# Patient Record
Sex: Female | Born: 1935 | Race: Black or African American | Hispanic: No | Smoking: Never smoker
Health system: Southern US, Community
[De-identification: ages and names within clinical notes are randomized; demographics above are authoritative.]

## PROBLEM LIST (undated history)

## (undated) DIAGNOSIS — Z9981 Dependence on supplemental oxygen: Secondary | ICD-10-CM

## (undated) DIAGNOSIS — H409 Unspecified glaucoma: Secondary | ICD-10-CM

## (undated) DIAGNOSIS — E039 Hypothyroidism, unspecified: Secondary | ICD-10-CM

## (undated) DIAGNOSIS — F0390 Unspecified dementia without behavioral disturbance: Secondary | ICD-10-CM

## (undated) DIAGNOSIS — J449 Chronic obstructive pulmonary disease, unspecified: Secondary | ICD-10-CM

## (undated) DIAGNOSIS — M1990 Unspecified osteoarthritis, unspecified site: Secondary | ICD-10-CM

## (undated) DIAGNOSIS — I4510 Unspecified right bundle-branch block: Secondary | ICD-10-CM

## (undated) DIAGNOSIS — R001 Bradycardia, unspecified: Secondary | ICD-10-CM

## (undated) DIAGNOSIS — F03A0 Unspecified dementia, mild, without behavioral disturbance, psychotic disturbance, mood disturbance, and anxiety: Secondary | ICD-10-CM

## (undated) DIAGNOSIS — I4821 Permanent atrial fibrillation: Secondary | ICD-10-CM

## (undated) DIAGNOSIS — N183 Chronic kidney disease, stage 3 unspecified: Secondary | ICD-10-CM

## (undated) DIAGNOSIS — I2699 Other pulmonary embolism without acute cor pulmonale: Secondary | ICD-10-CM

## (undated) DIAGNOSIS — J9611 Chronic respiratory failure with hypoxia: Secondary | ICD-10-CM

## (undated) DIAGNOSIS — I272 Pulmonary hypertension, unspecified: Secondary | ICD-10-CM

## (undated) DIAGNOSIS — I5032 Chronic diastolic (congestive) heart failure: Secondary | ICD-10-CM

## (undated) DIAGNOSIS — I4892 Unspecified atrial flutter: Secondary | ICD-10-CM

## (undated) DIAGNOSIS — R0600 Dyspnea, unspecified: Secondary | ICD-10-CM

## (undated) DIAGNOSIS — J8410 Pulmonary fibrosis, unspecified: Secondary | ICD-10-CM

## (undated) HISTORY — DX: Unspecified glaucoma: H40.9

## (undated) HISTORY — DX: Chronic kidney disease, stage 3 unspecified: N18.30

## (undated) HISTORY — DX: Unspecified dementia, mild, without behavioral disturbance, psychotic disturbance, mood disturbance, and anxiety: F03.A0

## (undated) HISTORY — PX: EYE SURGERY: SHX253

## (undated) HISTORY — DX: Bradycardia, unspecified: R00.1

## (undated) HISTORY — DX: Unspecified dementia without behavioral disturbance: F03.90

## (undated) HISTORY — DX: Chronic diastolic (congestive) heart failure: I50.32

## (undated) HISTORY — DX: Unspecified right bundle-branch block: I45.10

## (undated) HISTORY — DX: Chronic kidney disease, stage 3 (moderate): N18.3

## (undated) HISTORY — DX: Unspecified atrial flutter: I48.92

## (undated) HISTORY — DX: Pulmonary hypertension, unspecified: I27.20

---

## 1976-09-27 HISTORY — PX: OTHER SURGICAL HISTORY: SHX169

## 2006-11-14 ENCOUNTER — Emergency Department (HOSPITAL_COMMUNITY): Admission: EM | Admit: 2006-11-14 | Discharge: 2006-11-14 | Payer: Self-pay | Admitting: Family Medicine

## 2011-02-02 ENCOUNTER — Inpatient Hospital Stay (HOSPITAL_COMMUNITY)
Admission: EM | Admit: 2011-02-02 | Discharge: 2011-02-06 | DRG: 193 | Disposition: A | Discharge status: Home Health | Payer: Medicare Other | Attending: Internal Medicine | Admitting: Internal Medicine

## 2011-02-02 ENCOUNTER — Emergency Department (HOSPITAL_COMMUNITY): Payer: Medicare Other

## 2011-02-02 ENCOUNTER — Inpatient Hospital Stay (HOSPITAL_COMMUNITY): Payer: Medicare Other

## 2011-02-02 ENCOUNTER — Encounter (HOSPITAL_COMMUNITY): Payer: Self-pay | Admitting: Radiology

## 2011-02-02 DIAGNOSIS — I509 Heart failure, unspecified: Secondary | ICD-10-CM | POA: Diagnosis present

## 2011-02-02 DIAGNOSIS — F0390 Unspecified dementia without behavioral disturbance: Secondary | ICD-10-CM | POA: Diagnosis present

## 2011-02-02 DIAGNOSIS — J189 Pneumonia, unspecified organism: Principal | ICD-10-CM | POA: Diagnosis present

## 2011-02-02 DIAGNOSIS — R5381 Other malaise: Secondary | ICD-10-CM | POA: Diagnosis present

## 2011-02-02 DIAGNOSIS — R0789 Other chest pain: Secondary | ICD-10-CM | POA: Diagnosis present

## 2011-02-02 DIAGNOSIS — M79609 Pain in unspecified limb: Secondary | ICD-10-CM

## 2011-02-02 DIAGNOSIS — N200 Calculus of kidney: Secondary | ICD-10-CM | POA: Diagnosis present

## 2011-02-02 DIAGNOSIS — I5033 Acute on chronic diastolic (congestive) heart failure: Secondary | ICD-10-CM | POA: Diagnosis present

## 2011-02-02 DIAGNOSIS — R071 Chest pain on breathing: Secondary | ICD-10-CM | POA: Diagnosis present

## 2011-02-02 DIAGNOSIS — IMO0001 Reserved for inherently not codable concepts without codable children: Secondary | ICD-10-CM | POA: Diagnosis present

## 2011-02-02 LAB — URINALYSIS, ROUTINE W REFLEX MICROSCOPIC
Bilirubin Urine: NEGATIVE
Specific Gravity, Urine: 1.02 (ref 1.005–1.030)

## 2011-02-02 LAB — CK TOTAL AND CKMB (NOT AT ARMC)
Relative Index: 1.2 (ref 0.0–2.5)
Total CK: 332 U/L — ABNORMAL HIGH (ref 7–177)

## 2011-02-02 LAB — CBC
HCT: 41.6 % (ref 36.0–46.0)
Hemoglobin: 14.1 g/dL (ref 12.0–15.0)
MCH: 26.6 pg (ref 26.0–34.0)
MCH: 26.4 pg (ref 26.0–34.0)
MCHC: 33.9 g/dL (ref 30.0–36.0)
MCHC: 33.3 g/dL (ref 30.0–36.0)
RBC: 5.3 MIL/uL — ABNORMAL HIGH (ref 3.87–5.11)
RDW: 15.3 % (ref 11.5–15.5)
RDW: 15.4 % (ref 11.5–15.5)
WBC: 16.7 10*3/uL — ABNORMAL HIGH (ref 4.0–10.5)
WBC: 10.7 10*3/uL — ABNORMAL HIGH (ref 4.0–10.5)

## 2011-02-02 LAB — BASIC METABOLIC PANEL
BUN: 13 mg/dL (ref 6–23)
Calcium: 8.8 mg/dL (ref 8.4–10.5)
Chloride: 102 mEq/L (ref 96–112)
Creatinine, Ser: 0.84 mg/dL (ref 0.4–1.2)
GFR calc Af Amer: >60 mL/min (ref >60)
GFR calc non Af Amer: >60 mL/min (ref >60)
Sodium: 134 mEq/L — ABNORMAL LOW (ref 135–145)

## 2011-02-02 LAB — GLUCOSE, CAPILLARY
Glucose-Capillary: 292 mg/dL — ABNORMAL HIGH (ref 70–99)
Glucose-Capillary: 277 mg/dL — ABNORMAL HIGH (ref 70–99)
Glucose-Capillary: 333 mg/dL — ABNORMAL HIGH (ref 70–99)

## 2011-02-02 LAB — CARDIAC PANEL(CRET KIN+CKTOT+MB+TROPI)
CK, MB: 4.3 ng/mL — ABNORMAL HIGH (ref 0.3–4.0)
Relative Index: 1.6 (ref 0.0–2.5)
Total CK: 316 U/L — ABNORMAL HIGH (ref 7–177)
Total CK: 265 U/L — ABNORMAL HIGH (ref 7–177)

## 2011-02-02 LAB — DIFFERENTIAL
Eosinophils Absolute: 0.7 10*3/uL (ref 0.0–0.7)
Lymphocytes Relative: 6 % — ABNORMAL LOW (ref 12–46)
Monocytes Absolute: 1.4 10*3/uL — ABNORMAL HIGH (ref 0.1–1.0)
Monocytes Relative: 9 % (ref 3–12)
Neutro Abs: 13.5 10*3/uL — ABNORMAL HIGH (ref 1.7–7.7)

## 2011-02-02 LAB — URINE MICROSCOPIC-ADD ON

## 2011-02-02 LAB — D-DIMER, QUANTITATIVE: D-Dimer, Quant: 3.03 ug/mL-FEU — ABNORMAL HIGH (ref 0.00–0.48)

## 2011-02-02 LAB — TROPONIN I: Troponin I: <0.3 ng/mL (ref <0.30)

## 2011-02-02 MED ORDER — XENON XE 133 GAS
5.7000 | GAS_FOR_INHALATION | Freq: Once | RESPIRATORY_TRACT | Status: AC | PRN
  Administered 2011-02-02: 5.7 via RESPIRATORY_TRACT

## 2011-02-02 MED ORDER — TECHNETIUM TO 99M ALBUMIN AGGREGATED
5.4000 | Freq: Once | INTRAVENOUS | Status: AC | PRN
  Administered 2011-02-02: 5.4 via INTRAVENOUS

## 2011-02-03 ENCOUNTER — Inpatient Hospital Stay (HOSPITAL_COMMUNITY): Payer: Medicare Other

## 2011-02-03 DIAGNOSIS — I517 Cardiomegaly: Secondary | ICD-10-CM

## 2011-02-03 LAB — URINE CULTURE
Colony Count: NO GROWTH
Culture: NO GROWTH

## 2011-02-03 LAB — COMPREHENSIVE METABOLIC PANEL
AST: 15 U/L (ref 0–37)
BUN: 17 mg/dL (ref 6–23)
CO2: 26 mEq/L (ref 19–32)
Calcium: 8.8 mg/dL (ref 8.4–10.5)
Chloride: 104 mEq/L (ref 96–112)
Creatinine, Ser: 0.68 mg/dL (ref 0.4–1.2)
GFR calc non Af Amer: >60 mL/min (ref >60)
Glucose, Bld: 243 mg/dL — ABNORMAL HIGH (ref 70–99)
Total Bilirubin: 0.2 mg/dL — ABNORMAL LOW (ref 0.3–1.2)

## 2011-02-03 LAB — PHOSPHORUS: Phosphorus: 2.5 mg/dL (ref 2.3–4.6)

## 2011-02-03 LAB — CARDIAC PANEL(CRET KIN+CKTOT+MB+TROPI)
Total CK: 222 U/L — ABNORMAL HIGH (ref 7–177)
Total CK: 178 U/L — ABNORMAL HIGH (ref 7–177)
Troponin I: <0.3 ng/mL (ref <0.30)
Troponin I: <0.3 ng/mL (ref <0.30)

## 2011-02-03 LAB — DIFFERENTIAL
Basophils Absolute: 0 10*3/uL (ref 0.0–0.1)
Basophils Relative: 0 % (ref 0–1)
Eosinophils Relative: 13 % — ABNORMAL HIGH (ref 0–5)
Lymphocytes Relative: 25 % (ref 12–46)
Monocytes Absolute: 0.8 10*3/uL (ref 0.1–1.0)
Neutro Abs: 5 10*3/uL (ref 1.7–7.7)

## 2011-02-03 LAB — EXPECTORATED SPUTUM ASSESSMENT W GRAM STAIN, RFLX TO RESP C

## 2011-02-03 LAB — MAGNESIUM: Magnesium: 2.2 mg/dL (ref 1.5–2.5)

## 2011-02-03 LAB — CBC
HCT: 34.5 % — ABNORMAL LOW (ref 36.0–46.0)
MCHC: 33 g/dL (ref 30.0–36.0)
RDW: 15.5 % (ref 11.5–15.5)
WBC: 9.4 10*3/uL (ref 4.0–10.5)

## 2011-02-03 LAB — PROTIME-INR
INR: 1.22 (ref 0.00–1.49)
Prothrombin Time: 15.6 seconds — ABNORMAL HIGH (ref 11.6–15.2)

## 2011-02-03 LAB — GLUCOSE, CAPILLARY
Glucose-Capillary: 228 mg/dL — ABNORMAL HIGH (ref 70–99)
Glucose-Capillary: 234 mg/dL — ABNORMAL HIGH (ref 70–99)

## 2011-02-04 ENCOUNTER — Encounter (HOSPITAL_COMMUNITY): Payer: Self-pay | Admitting: Radiology

## 2011-02-04 ENCOUNTER — Inpatient Hospital Stay (HOSPITAL_COMMUNITY): Payer: Medicare Other

## 2011-02-04 LAB — BASIC METABOLIC PANEL
Calcium: 9.4 mg/dL (ref 8.4–10.5)
Chloride: 102 mEq/L (ref 96–112)
Creatinine, Ser: 0.67 mg/dL (ref 0.4–1.2)
GFR calc Af Amer: >60 mL/min (ref >60)
Sodium: 134 mEq/L — ABNORMAL LOW (ref 135–145)

## 2011-02-04 LAB — CBC
MCH: 25.3 pg — ABNORMAL LOW (ref 26.0–34.0)
MCHC: 31.8 g/dL (ref 30.0–36.0)
MCV: 79.5 fL (ref 78.0–100.0)
Platelets: 234 10*3/uL (ref 150–400)
RBC: 4.63 MIL/uL (ref 3.87–5.11)

## 2011-02-04 LAB — GLUCOSE, CAPILLARY: Glucose-Capillary: 322 mg/dL — ABNORMAL HIGH (ref 70–99)

## 2011-02-04 LAB — PROTIME-INR: INR: 1.15 (ref 0.00–1.49)

## 2011-02-04 MED ORDER — IOHEXOL 300 MG/ML  SOLN
80.0000 mL | Freq: Once | INTRAMUSCULAR | Status: AC | PRN
  Administered 2011-02-04: 80 mL via INTRAVENOUS

## 2011-02-05 LAB — LEGIONELLA ANTIGEN, URINE

## 2011-02-05 LAB — GLUCOSE, CAPILLARY
Glucose-Capillary: 126 mg/dL — ABNORMAL HIGH (ref 70–99)
Glucose-Capillary: 113 mg/dL — ABNORMAL HIGH (ref 70–99)

## 2011-02-05 LAB — BASIC METABOLIC PANEL
BUN: 17 mg/dL (ref 6–23)
Chloride: 106 mEq/L (ref 96–112)
Creatinine, Ser: 0.64 mg/dL (ref 0.4–1.2)
Glucose, Bld: 154 mg/dL — ABNORMAL HIGH (ref 70–99)
Potassium: 3.9 mEq/L (ref 3.5–5.1)

## 2011-02-05 LAB — CBC
HCT: 34.1 % — ABNORMAL LOW (ref 36.0–46.0)
MCH: 26.3 pg (ref 26.0–34.0)
MCV: 78.8 fL (ref 78.0–100.0)
Platelets: 248 10*3/uL (ref 150–400)
RDW: 15 % (ref 11.5–15.5)
WBC: 13.1 10*3/uL — ABNORMAL HIGH (ref 4.0–10.5)

## 2011-02-06 LAB — BASIC METABOLIC PANEL
CO2: 29 mEq/L (ref 19–32)
Chloride: 104 mEq/L (ref 96–112)
GFR calc Af Amer: >60 mL/min (ref >60)
Glucose, Bld: 84 mg/dL (ref 70–99)
Potassium: 3.8 mEq/L (ref 3.5–5.1)
Sodium: 142 mEq/L (ref 135–145)

## 2011-02-06 LAB — CULTURE, RESPIRATORY W GRAM STAIN: Culture: NORMAL

## 2011-02-06 LAB — GLUCOSE, CAPILLARY

## 2011-02-17 NOTE — Discharge Summary (Signed)
Sara Dennis, Sara Dennis                 ACCOUNT NO.:  [id number]  MEDICAL RECORD NO.:  [id number]           PATIENT TYPE:  I  LOCATION:  1519                         FACILITY:  [facility]  PHYSICIAN:  Rosanna Randy, MDDATE OF BIRTH:  Dec 22, 1921  DATE OF ADMISSION:  02/02/2011 DATE OF DISCHARGE:  02/06/2011                              DISCHARGE SUMMARY   PRIMARY CARE PHYSICIAN:  Dr. Jeannine Boga  DISCHARGE DIAGNOSES: 1. Atypical right-sided chest pain, pleuritic in nature, secondary to     pneumonia. 2. Shortness of breath secondary to pneumonia and also grade 1     diastolic dysfunction. 3. Diabetes mellitus. 4. Dementia. 5. Physical deconditioning. 6. Oxygen desaturation on exertion secondary to pneumonia and     diastolic dysfunction. 7. Leukocytosis and fever secondary to pneumonia. 8. Nephrolithiasis.  DISCHARGE MEDICATIONS: 1. Aspirin 325 mg by mouth daily. 2. Lasix 20 mg by mouth daily. 3. Glipizide 5 mg by mouth twice a day with meals. 4. Moxifloxacin 400 mg one tablet by mouth daily for the next 4 days     to complete antibiotic therapy. 5. Potassium 20 mEq by mouth daily. 6. Lantus 35 units subcutaneously twice a day. 7. Donepezil 5 mg one tablet by mouth every morning. 8. Ocuvite one tablet by mouth daily. 9. The patient has been informed to stop using her extended release     metformin 500 mg one tablet by mouth daily at bedtime until she     follows with her primary care physician.  DISPOSITION AND FOLLOWUP:  The patient has been discharged in stable and improved condition with instructions to arrange a followup appointment with her primary care physician over the next 5 to 7 days.  After Physical Therapy evaluation done inside the hospital, recommendations are for the patient to go home with home health physical therapy, which is going to be arranged for her.  Also due to oxygen desaturation due to acute pneumonia and also findings of a grade 1  diastolic dysfunction, the patient is going to be discharged on oxygen in order for her to use 1 to 2 liters through nasal cannula of oxygen at all times, especially on exertion until she follows with her primary care physician.  At that time it is going to be important to review the patient's basic metabolic panel to make sure that her electrolytes are okay, especially after starting her on Lasix.  It is going to be important to reassess the patient's oxygen saturation, especially on ambulation, to determine if she will require further use of oxygen.  It will be also important to evaluate the patient's blood glucose log in order to determine further adjustment on her diabetes medications.  She was discharged on a twice a day Lantus injection to have a better coverage and overlapping throughout the day with the long-acting baseline insulin and also with glipizide twice a day.  Using this regimen inside the hospital, the patient's blood sugar maintained for about 48 hours in the 113 to 160 range.  There were no incidents with hypoglycemia events.  PROCEDURES PERFORMED DURING THIS HOSPITALIZATION:  The patient had  a chest x-ray on May 8, at that moment demonstrating cardiac enlargement without prominence of pulmonary vascularity.  There were also some interstitial changes in the lung bases that suggest interstitial edema. There were no definite focal consolidations and there was no definite blunting of the costophrenic angles.  The patient had a Nuclear Medicine VQ scan that demonstrated intermittent probability for pulmonary embolism.  At that moment she had a CT angiogram of the chest that demonstrated interstitial edema and a small bilateral pleural effusion without any pulmonary embolism.  The patient also had bilateral lower extremity Dopplers that demonstrated no evidence of deep vein or superficial thrombosis involving the right lower extremity and left lower extremity.  Varicose  veins, which appear patent, were visualized in both calves.  There was no evidence of Baker's cysts on the right or left lower extremity.  The patient also had a 2-D echocardiogram on Feb 03, 2011, that demonstrated mild left ventricular hypertrophy with a mild focal basal hypertrophy of the septum.  Normal systolic function with an ejection fraction of 60% to 65%.  Also abnormal left ventricular relaxation with Doppler parameters finding for grade 1 diastolic dysfunction.  No other procedures were performed during this admission.  CONSULTATIONS:  No consultations were made.  HISTORY OF PRESENT ILLNESS:  Sara Dennis is an 77 year old female with a history of diabetes and nephrolithiasis who presented to the Emergency Department complaining of fever, weakness, shortness of breath and right- sided chest discomfort.  The patient has had malaise and has not been eating well throughout the last 5 to 7 days prior to admission.  She had recently been treated for a UTI with Bactrim, but developed a rash and then finished a course of Macrobid the day prior to being admitted.  The patient had a chest x-ray in the Emergency Department that was read as possible congestive heart failure.  However, the patient clinically fits more into the pneumonia-like picture with elevated white blood cells, fever and also cough associated with general malaise.  The patient was admitted for possible community-acquired pneumonia treatment.  Due to her chest pain, rule out pulmonary embolism and any other conditions, especially since the patient has a family history for pulmonary embolism.  For further details, please refer to the dictation done by Dr. Henry Russel on Feb 02, 2011.  PHYSICAL EXAMINATION ON ADMISSION:  VITAL SIGNS:  Demonstrated a temperature of 101.6, heart rate of 108, blood pressure 167/97, oxygen saturation was 99% on room air. HEENT:  Anicteric. NECK:  No JVD.  No bruit. HEART:  Regular rate and rhythm.   S1, S2 auscultated without any murmurs. LUNGS:  Positive crackles, left greater than right.  Scattered rhonchi. Good air movement. ABDOMEN:  Soft, obese, nontender, nondistended with positive bowel sounds. EXTREMITIES:  No cyanosis, clubbing or edema.  There was no pain either. Negative Hoffmann signs. SKIN:  No rashes. LYMPH NODES:  Without any adenopathy. NEUROLOGIC:  Examination was nonfocal.  PERTINENT LABORATORY DATA:  On admission white blood cells 16.7, hemoglobin was 14.1, platelet count 257.  Urinalysis was negative to demonstrate any abnormality.  A basic metabolic panel showed a sodium of 134, potassium 4.1, BUN 13, creatinine 0.87.  BNP was 750.  Chest x-ray as mentioned above with findings consistent with congestive heart failure and interstitial lung edema with no definite focal consolidation.  Other pertinent laboratory data throughout this hospitalization includes a D-dimer which was elevated at 3.03.  Cardiac markers negative x3.  The patient had a magnesium of 2.1,  phosphorus of 3.0.  A sputum culture demonstrated a few gram-positive rods with a few gram-positive cocci in pairs.  Legionella urine antigen was negative. Hemoglobin A1c 9.7.  PT 14.9, INR 1.15.  HOSPITAL COURSE BY PROBLEM: 1. The patient's pneumonia with clinical findings of cough, fever,     elevated white blood cells and also a respiratory culture with a     few gram-negative rods and a few gram-positive cocci in pairs.  The     patient received initially treatment with Zithromax and Rocephin     for 3 days and then was transitioned to Avelox by mouth daily with     instructions to complete 8 days total of antibiotics.  The patient     is going to arrange followup with her primary care physician over     the next 5 to 7 days in order to follow complete resolution of her     symptoms and also to have further adjustments of her medications,     especially for diabetes.  At that point it is going to be  also     evaluated and determined if the patient is going to require further     oxygen supplementation.  At the moment of discharge the patient's     fever, white blood cells and cough were pretty much resolved. 2. The patient's diastolic dysfunction with a CHF grade 1 that was     found on the 2-D echocardiogram performed during this admission and     also with an elevated BNP.  The patient was started on Lasix that     had actually pretty much resolved the crackles on physical     examination auscultations and was instructed to follow a low-sodium     diet less than 2 grams daily.  She is going to follow with primary     care physician and further adjustments can be done for these     problems. 3. The patient's diabetes, uncontrolled, with a hemoglobin A1c of 9.5.     At this moment a new regimen using Lantus twice a day and glipizide     5 mg was started.  The patient's blood sugar behaved wonderfully     when she was in the hospital.  She is going to continue having     further adjustments as an outpatient by primary care physician     during her followup. 4. The patient's dementia.  The plan is to continue using Aricept. 5. The patient's physical deconditioning, most likely secondary to the     infection.  She is going to have home health physical therapy     arranged as an outpatient. 6. The patient's oxygen desaturation on exertion, most likely a     combination of her diastolic congestive heart failure with acute     mild exacerbation due to the infection in her lungs and also the     infection itself.  At this moment she is going to continue using 1     to 2 liters of oxygen delivered through nasal cannula.  A portable     oxygen kit has been arranged for the patient to go home with and     she is going to follow with her primary care physician where she is     going to be reevaluated for the need of oxygen supplementation.  PHYSICAL EXAMINATION ON DISCHARGE:  VITAL SIGNS:   97.5 temperature, heart rate 60, respiratory rate 19,  blood pressure 157/88, oxygen saturation 96% on 2 liters. GENERAL:  The patient without any acute distress. RESPIRATORY SYSTEM:  Clear to auscultation bilaterally except for mild rhonchi. HEART:  Regular rate and rhythm. ABDOMEN:  Soft, nontender, nondistended.  Positive bowel sounds. EXTREMITIES:  Without any edema. NEUROLOGIC:  Examination was nonfocal.  LABORATORY DATA ON DISCHARGE:  The patient's blood work at discharge demonstrated a sodium of 142, potassium 3.8, chloride 104, bicarbonate 29, BUN 19, creatinine 0.62, blood sugar 84.  Her calcium was 9.2.     Rosanna Randy, MD     CEM/MEDQ  D:  02/06/2011  T:  02/06/2011  Job:  [id number]  cc:   Dr Norman Clay  Electronically Signed by Vassie Loll MD on 02/17/2011 10:25:11 PM

## 2011-03-07 NOTE — H&P (Signed)
Sara Dennis, Sara Dennis                 ACCOUNT NO.:  [id number]  MEDICAL RECORD NO.:  [id number]           PATIENT TYPE:  E  LOCATION:  WLED                         FACILITY:  [facility]  PHYSICIAN:  Massie Maroon, MD        DATE OF BIRTH:  [birth year]  DATE OF ADMISSION:  02/02/2011 DATE OF DISCHARGE:                             HISTORY & PHYSICAL   CHIEF COMPLAINT:  Fever and shortness of breath and weakness.  HISTORY OF PRESENT ILLNESS:  An 77 year old female with a history of diabetes type 2, nephrolithiasis apparently presents with complaints of fever, weakness, shortness of breath, and right-sided chest discomfort. The patient has had malaise and has not been eating well.  She had recently been treated for UTI with Bactrim but developed a rash and then finished a course of Macrobid yesterday.  The patient had a chest x-ray in the ED.  That was read as possible congestive heart failure. However, clinically she fits more in a pneumonia like picture with an elevated white count and fever.  The patient will be admitted for possible community-acquired pneumonia.  PAST MEDICAL HISTORY: 1. Diabetes type 2. 2. Nephrolithiasis.  PAST SURGICAL HISTORY:  Nephrolithiasis.  SOCIAL HISTORY:  The patient does not smoke or drink.  She spends time between here and [place], [place].  She used to work for [employer].  FAMILY HISTORY:  None.  ALLERGIES:  BACTRIM - rash.  MEDICATIONS: 1. Metformin 500 mg p.o. daily. 2. Nitrofurantoin 100 mg p.o. b.i.d. 3. Aricept 5 mg p.o. daily. 4. Ocuvite one p.o. daily.  REVIEW OF SYSTEMS:  Negative for all 10 organ systems except for pertinent positives stated above.  PHYSICAL EXAM:  VITAL SIGNS:  Temperature 101.6, pulse 108, blood pressure 167/97, pulse ox 99% on room air. HEENT:  Anicteric. NECK:  No JVD, no bruit. HEART:  Regular rate and rhythm.  S1, S2. LUNGS:  Positive crackles, left greater than right base. ABDOMEN:  Soft, obese,  nontender, nondistended.  Positive bowel sounds. EXTREMITIES:  No cyanosis, clubbing, or edema. SKIN:  No rashes. LYMPH NODES:  No adenopathy. NEURO:  Exam nonfocal.  LABORATORY DATA:  WBC 16.7, hemoglobin 14.1, platelet count 257. Urinalysis is negative.  Sodium 134, potassium 4.1, BUN 13, creatinine 0.87.  Pro BNP 750.7.  Chest x-ray, congestive changes in the heart and lungs with interstitial edema.  ASSESSMENT/PLAN: 1. Clinically pneumonia:  (Community-acquired pneumonia) Sputum Gram     stain and culture, urine Legionella antigen and treat with     ceftriaxone and Zithromax. 2. Chest pain:  The patient placed on telemetry.  We will check CK, CK-     MB, troponin I q.6 h x3 sets.  Check a D-dimer and if positive     obtain a CTA chest. 3. Diabetes type 2. Fingerstick blood sugars a.c. and h.s. NovoLog     sensitive sliding scale. 4. Nephrolithiasis:  The patient has hematuria on UA.  This can be     followed up as an outpatient. 5. Hypertension uncontrolled:  Hydralazine 10 mg IV q.6 h. .r.n.     systolic blood pressure  greater than 160. 6. Cardiac murmur:  Check cardiac 2-D echo.  DVT prophylaxis Lovenox,     SCDs.     Massie Maroon, MD     JYK/MEDQ  D:  02/02/2011  T:  02/02/2011  Job:  [id number]  Electronically Signed by Pearson Grippe MD on 03/07/2011 07:27:39 PM

## 2011-03-21 ENCOUNTER — Inpatient Hospital Stay (HOSPITAL_COMMUNITY)
Admission: EM | Admit: 2011-03-21 | Discharge: 2011-03-31 | DRG: 291 | Disposition: A | Discharge status: Home / Self Care | Payer: Medicare Other | Attending: Hospitalist | Admitting: Hospitalist

## 2011-03-21 ENCOUNTER — Inpatient Hospital Stay (HOSPITAL_COMMUNITY): Payer: Medicare Other

## 2011-03-21 ENCOUNTER — Emergency Department (HOSPITAL_COMMUNITY): Payer: Medicare Other

## 2011-03-21 DIAGNOSIS — J9600 Acute respiratory failure, unspecified whether with hypoxia or hypercapnia: Secondary | ICD-10-CM | POA: Diagnosis present

## 2011-03-21 DIAGNOSIS — Z794 Long term (current) use of insulin: Secondary | ICD-10-CM

## 2011-03-21 DIAGNOSIS — F0390 Unspecified dementia without behavioral disturbance: Secondary | ICD-10-CM | POA: Diagnosis present

## 2011-03-21 DIAGNOSIS — E119 Type 2 diabetes mellitus without complications: Secondary | ICD-10-CM | POA: Diagnosis present

## 2011-03-21 DIAGNOSIS — I509 Heart failure, unspecified: Secondary | ICD-10-CM | POA: Diagnosis present

## 2011-03-21 DIAGNOSIS — I2699 Other pulmonary embolism without acute cor pulmonale: Secondary | ICD-10-CM | POA: Diagnosis not present

## 2011-03-21 DIAGNOSIS — Z7982 Long term (current) use of aspirin: Secondary | ICD-10-CM

## 2011-03-21 DIAGNOSIS — I5031 Acute diastolic (congestive) heart failure: Principal | ICD-10-CM | POA: Diagnosis present

## 2011-03-21 DIAGNOSIS — I2789 Other specified pulmonary heart diseases: Secondary | ICD-10-CM | POA: Diagnosis present

## 2011-03-21 DIAGNOSIS — D72829 Elevated white blood cell count, unspecified: Secondary | ICD-10-CM | POA: Diagnosis present

## 2011-03-21 DIAGNOSIS — J8410 Pulmonary fibrosis, unspecified: Secondary | ICD-10-CM | POA: Diagnosis present

## 2011-03-21 LAB — CBC
HCT: 37 % (ref 36.0–46.0)
Hemoglobin: 12.8 g/dL (ref 12.0–15.0)
Hemoglobin: 14.4 g/dL (ref 12.0–15.0)
MCH: 27.2 pg (ref 26.0–34.0)
Platelets: 198 10*3/uL (ref 150–400)
RBC: 4.73 MIL/uL (ref 3.87–5.11)
RBC: 5.29 MIL/uL — ABNORMAL HIGH (ref 3.87–5.11)
RDW: 15 % (ref 11.5–15.5)
WBC: 11.6 10*3/uL — ABNORMAL HIGH (ref 4.0–10.5)
WBC: 11.9 10*3/uL — ABNORMAL HIGH (ref 4.0–10.5)

## 2011-03-21 LAB — DIFFERENTIAL
Basophils Absolute: 0.1 10*3/uL (ref 0.0–0.1)
Basophils Absolute: 0.1 10*3/uL (ref 0.0–0.1)
Basophils Relative: 1 % (ref 0–1)
Eosinophils Relative: 1 % (ref 0–5)
Lymphocytes Relative: 23 % (ref 12–46)
Lymphocytes Relative: 23 % (ref 12–46)
Lymphs Abs: 2.7 10*3/uL (ref 0.7–4.0)
Monocytes Absolute: 1.2 10*3/uL — ABNORMAL HIGH (ref 0.1–1.0)
Monocytes Relative: 7 % (ref 3–12)
Neutro Abs: 7.9 10*3/uL — ABNORMAL HIGH (ref 1.7–7.7)
Neutrophils Relative %: 68 % (ref 43–77)

## 2011-03-21 LAB — BASIC METABOLIC PANEL
BUN: 27 mg/dL — ABNORMAL HIGH (ref 6–23)
CO2: 25 mEq/L (ref 19–32)
Calcium: 9.5 mg/dL (ref 8.4–10.5)
Chloride: 102 mEq/L (ref 96–112)
Chloride: 100 mEq/L (ref 96–112)
Creatinine, Ser: 1.09 mg/dL (ref 0.50–1.10)
GFR calc Af Amer: 59 mL/min — ABNORMAL LOW (ref >60)
GFR calc Af Amer: 58 mL/min — ABNORMAL LOW (ref >60)
Potassium: 4.7 mEq/L (ref 3.5–5.1)
Sodium: 136 mEq/L (ref 135–145)

## 2011-03-21 LAB — CK TOTAL AND CKMB (NOT AT ARMC): Relative Index: 3.7 — ABNORMAL HIGH (ref 0.0–2.5)

## 2011-03-21 LAB — HEMOGLOBIN A1C
Hgb A1c MFr Bld: 8.8 % — ABNORMAL HIGH (ref <5.7)
Mean Plasma Glucose: 206 mg/dL — ABNORMAL HIGH (ref <117)

## 2011-03-21 LAB — COMPREHENSIVE METABOLIC PANEL
AST: 16 U/L (ref 0–37)
Albumin: 3.4 g/dL — ABNORMAL LOW (ref 3.5–5.2)
Alkaline Phosphatase: 52 U/L (ref 39–117)
BUN: 28 mg/dL — ABNORMAL HIGH (ref 6–23)
Chloride: 101 mEq/L (ref 96–112)
Potassium: 4.3 mEq/L (ref 3.5–5.1)
Sodium: 139 mEq/L (ref 135–145)
Total Bilirubin: 0.2 mg/dL — ABNORMAL LOW (ref 0.3–1.2)
Total Protein: 6.8 g/dL (ref 6.0–8.3)

## 2011-03-21 LAB — MAGNESIUM: Magnesium: 2 mg/dL (ref 1.5–2.5)

## 2011-03-21 LAB — PRO B NATRIURETIC PEPTIDE: Pro B Natriuretic peptide (BNP): 2132 pg/mL — ABNORMAL HIGH (ref 0–450)

## 2011-03-21 LAB — GLUCOSE, CAPILLARY
Glucose-Capillary: 205 mg/dL — ABNORMAL HIGH (ref 70–99)
Glucose-Capillary: 435 mg/dL — ABNORMAL HIGH (ref 70–99)

## 2011-03-21 LAB — CARDIAC PANEL(CRET KIN+CKTOT+MB+TROPI)
Relative Index: 3.6 — ABNORMAL HIGH (ref 0.0–2.5)
Relative Index: 3.5 — ABNORMAL HIGH (ref 0.0–2.5)
Total CK: 136 U/L (ref 7–177)
Troponin I: <0.3 ng/mL (ref <0.30)

## 2011-03-21 LAB — TSH: TSH: 3.595 u[IU]/mL (ref 0.350–4.500)

## 2011-03-21 LAB — TROPONIN I: Troponin I: <0.3 ng/mL (ref <0.30)

## 2011-03-22 ENCOUNTER — Inpatient Hospital Stay (HOSPITAL_COMMUNITY): Payer: Medicare Other

## 2011-03-22 LAB — CBC
HCT: 39.6 % (ref 36.0–46.0)
MCHC: 33.8 g/dL (ref 30.0–36.0)
MCV: 78.1 fL (ref 78.0–100.0)
Platelets: 223 10*3/uL (ref 150–400)
RDW: 15 % (ref 11.5–15.5)

## 2011-03-22 LAB — GLUCOSE, CAPILLARY: Glucose-Capillary: 204 mg/dL — ABNORMAL HIGH (ref 70–99)

## 2011-03-22 LAB — BASIC METABOLIC PANEL
BUN: 33 mg/dL — ABNORMAL HIGH (ref 6–23)
Creatinine, Ser: 1.15 mg/dL — ABNORMAL HIGH (ref 0.50–1.10)
GFR calc Af Amer: 54 mL/min — ABNORMAL LOW (ref >60)
GFR calc non Af Amer: 45 mL/min — ABNORMAL LOW (ref >60)

## 2011-03-23 ENCOUNTER — Inpatient Hospital Stay (HOSPITAL_COMMUNITY): Payer: Medicare Other

## 2011-03-23 ENCOUNTER — Encounter (HOSPITAL_COMMUNITY): Payer: Self-pay | Admitting: Radiology

## 2011-03-23 DIAGNOSIS — I2699 Other pulmonary embolism without acute cor pulmonale: Secondary | ICD-10-CM

## 2011-03-23 HISTORY — DX: Other pulmonary embolism without acute cor pulmonale: I26.99

## 2011-03-23 LAB — BASIC METABOLIC PANEL
GFR calc Af Amer: 58 mL/min — ABNORMAL LOW (ref >60)
GFR calc non Af Amer: 48 mL/min — ABNORMAL LOW (ref >60)
Potassium: 4.8 mEq/L (ref 3.5–5.1)
Sodium: 136 mEq/L (ref 135–145)

## 2011-03-23 LAB — GLUCOSE, CAPILLARY
Glucose-Capillary: 265 mg/dL — ABNORMAL HIGH (ref 70–99)
Glucose-Capillary: 310 mg/dL — ABNORMAL HIGH (ref 70–99)
Glucose-Capillary: 279 mg/dL — ABNORMAL HIGH (ref 70–99)
Glucose-Capillary: 256 mg/dL — ABNORMAL HIGH (ref 70–99)

## 2011-03-23 LAB — HEPARIN LEVEL (UNFRACTIONATED): Heparin Unfractionated: 0.97 IU/mL — ABNORMAL HIGH (ref 0.30–0.70)

## 2011-03-23 MED ORDER — IOHEXOL 300 MG/ML  SOLN: 100.0000 mL | Freq: Once | INTRAMUSCULAR | Status: AC | PRN

## 2011-03-24 LAB — HEPARIN LEVEL (UNFRACTIONATED)
Heparin Unfractionated: 0.95 IU/mL — ABNORMAL HIGH (ref 0.30–0.70)
Heparin Unfractionated: 0.21 IU/mL — ABNORMAL LOW (ref 0.30–0.70)

## 2011-03-24 LAB — CBC
Hemoglobin: 12.4 g/dL (ref 12.0–15.0)
MCH: 26 pg (ref 26.0–34.0)
MCHC: 33.5 g/dL (ref 30.0–36.0)
Platelets: 250 10*3/uL (ref 150–400)
RDW: 14.5 % (ref 11.5–15.5)

## 2011-03-24 LAB — BASIC METABOLIC PANEL
CO2: 24 mEq/L (ref 19–32)
Calcium: 9.2 mg/dL (ref 8.4–10.5)
Creatinine, Ser: 1.44 mg/dL — ABNORMAL HIGH (ref 0.50–1.10)

## 2011-03-24 LAB — GLUCOSE, CAPILLARY
Glucose-Capillary: 125 mg/dL — ABNORMAL HIGH (ref 70–99)
Glucose-Capillary: 173 mg/dL — ABNORMAL HIGH (ref 70–99)

## 2011-03-25 LAB — BASIC METABOLIC PANEL
GFR calc Af Amer: >60 mL/min (ref >60)
GFR calc non Af Amer: 54 mL/min — ABNORMAL LOW (ref >60)
Potassium: 4.1 mEq/L (ref 3.5–5.1)
Sodium: 140 mEq/L (ref 135–145)

## 2011-03-25 LAB — CBC
Hemoglobin: 12.3 g/dL (ref 12.0–15.0)
MCHC: 33.7 g/dL (ref 30.0–36.0)
Platelets: 225 10*3/uL (ref 150–400)
RDW: 15.1 % (ref 11.5–15.5)

## 2011-03-25 LAB — DIFFERENTIAL
Basophils Absolute: 0.1 10*3/uL (ref 0.0–0.1)
Basophils Relative: 1 % (ref 0–1)
Eosinophils Relative: 2 % (ref 0–5)
Monocytes Absolute: 1.2 10*3/uL — ABNORMAL HIGH (ref 0.1–1.0)
Neutro Abs: 7 10*3/uL (ref 1.7–7.7)

## 2011-03-25 LAB — GLUCOSE, CAPILLARY: Glucose-Capillary: 112 mg/dL — ABNORMAL HIGH (ref 70–99)

## 2011-03-25 LAB — HEPARIN LEVEL (UNFRACTIONATED): Heparin Unfractionated: 0.36 IU/mL (ref 0.30–0.70)

## 2011-03-26 LAB — COMPREHENSIVE METABOLIC PANEL
ALT: 19 U/L (ref 0–35)
AST: 20 U/L (ref 0–37)
Calcium: 9.1 mg/dL (ref 8.4–10.5)
GFR calc Af Amer: >60 mL/min (ref >60)
Glucose, Bld: 64 mg/dL — ABNORMAL LOW (ref 70–99)
Sodium: 140 mEq/L (ref 135–145)
Total Protein: 6.9 g/dL (ref 6.0–8.3)

## 2011-03-26 LAB — APTT: aPTT: 34 seconds (ref 24–37)

## 2011-03-26 LAB — HEPARIN LEVEL (UNFRACTIONATED)
Heparin Unfractionated: <0.1 IU/mL — ABNORMAL LOW (ref 0.30–0.70)
Heparin Unfractionated: <0.1 IU/mL — ABNORMAL LOW (ref 0.30–0.70)
Heparin Unfractionated: 0.19 IU/mL — ABNORMAL LOW (ref 0.30–0.70)

## 2011-03-26 LAB — CBC
Hemoglobin: 11.4 g/dL — ABNORMAL LOW (ref 12.0–15.0)
MCH: 25.6 pg — ABNORMAL LOW (ref 26.0–34.0)
MCV: 80 fL (ref 78.0–100.0)
RBC: 4.45 MIL/uL (ref 3.87–5.11)
WBC: 10.3 10*3/uL (ref 4.0–10.5)

## 2011-03-26 LAB — DIFFERENTIAL
Basophils Relative: 1 % (ref 0–1)
Lymphs Abs: 2.8 10*3/uL (ref 0.7–4.0)
Monocytes Relative: 9 % (ref 3–12)
Neutro Abs: 6.2 10*3/uL (ref 1.7–7.7)
Neutrophils Relative %: 60 % (ref 43–77)

## 2011-03-26 LAB — PROTIME-INR: INR: 1.06 (ref 0.00–1.49)

## 2011-03-26 LAB — GLUCOSE, CAPILLARY: Glucose-Capillary: 104 mg/dL — ABNORMAL HIGH (ref 70–99)

## 2011-03-27 LAB — HEPARIN LEVEL (UNFRACTIONATED): Heparin Unfractionated: 0.45 IU/mL (ref 0.30–0.70)

## 2011-03-27 LAB — GLUCOSE, CAPILLARY
Glucose-Capillary: 151 mg/dL — ABNORMAL HIGH (ref 70–99)
Glucose-Capillary: 91 mg/dL (ref 70–99)
Glucose-Capillary: 184 mg/dL — ABNORMAL HIGH (ref 70–99)

## 2011-03-27 LAB — CBC
HCT: 33.8 % — ABNORMAL LOW (ref 36.0–46.0)
MCHC: 32.5 g/dL (ref 30.0–36.0)
RDW: 14.9 % (ref 11.5–15.5)

## 2011-03-27 LAB — PROTIME-INR: Prothrombin Time: 14.8 seconds (ref 11.6–15.2)

## 2011-03-28 LAB — GLUCOSE, CAPILLARY: Glucose-Capillary: 182 mg/dL — ABNORMAL HIGH (ref 70–99)

## 2011-03-28 LAB — CBC
Platelets: 245 10*3/uL (ref 150–400)
RDW: 14.9 % (ref 11.5–15.5)
WBC: 11.1 10*3/uL — ABNORMAL HIGH (ref 4.0–10.5)

## 2011-03-28 LAB — PROTIME-INR
INR: 1.19 (ref 0.00–1.49)
Prothrombin Time: 15.4 seconds — ABNORMAL HIGH (ref 11.6–15.2)

## 2011-03-28 LAB — HEPARIN LEVEL (UNFRACTIONATED): Heparin Unfractionated: 0.38 IU/mL (ref 0.30–0.70)

## 2011-03-28 NOTE — Discharge Summary (Signed)
Sara Dennis, EHINGER NO.:  [id number]  MEDICAL RECORD NO.:  [id number]  LOCATION:  3743                         FACILITY:  [facility]  PHYSICIAN:  Talmage Nap, MD  DATE OF BIRTH:  1922/02/29  DATE OF ADMISSION:  03/21/2011 DATE OF DISCHARGE:                        DISCHARGE SUMMARY - REFERRING   DATE OF DISCHARGE:  To be determined by the rounding physician.  PRIMARY CARE PHYSICIAN:  Dr. Jeannine Boga.  DISCHARGE DIAGNOSES: 1. Bilateral pulmonary embolism. 2. Congestive heart failure (diastolic dysfunction). 3. Diabetes mellitus. 4. History of kidney stones. 5. Dementia. 6. Pulmonary hypertension.  HISTORY:  The patient is an 77 year old African American female who was recently diagnosed with congestive heart failure and acute diastolic dysfunction who was admitted to the hospital on March 21, 2011 with shortness of breath of unspecified duration.  The shortness of breath was said to be exacerbated by movement.  There was however no history of chest pain.  There was no history of fever, no chills, no rigor, and subsequently brought to the emergency room to be evaluated.  PREADMISSION MEDICATIONS: 1. Aspirin 325 mg p.o. daily. 2. Lasix 20 mg p.o. daily. 3. Glipizide 5 mg p.o. b.i.d. 4. Potassium chloride 20 mEq p.o. b.i.d. 5. Lantus insulin 35 units subcu b.i.d. 6. Donepezil 5 mg p.o. q.a.m. 7. Ocuvite 1 tablet p.o. daily. 8. Metformin 500 mg p.o. at bedtime.  ALLERGIES:  IV DYE AND BACTRIM.  SOCIAL HISTORY:  Negative for alcohol or tobacco use.  REVIEW OF SYSTEMS:  Essentially documented in the initial history and physical.  PHYSICAL EXAMINATION:  At the time, the patient was seen by the admitting physician. VITAL SIGNS:  Temperature 97.5, blood pressure 120/60, pulse 77, respiratory rate 19.  She was said to be saturating 90% on O2 via nasal cannula 2 L per minute. HEENT:  Pupils are reactive to light and extraocular muscles are  intact. NECK:  No jugular venous distention.  No carotid bruit.  No lymphadenopathy. CHEST:  Showed bibasilar crackles. HEART:  Sounds are one and two. ABDOMEN:  Obese, nontender.  Liver, spleen, and kidney are not palpable. Bowel sounds are positive. EXTREMITIES:  Showed no pedal edema. NEUROLOGIC:  Nonfocal. MUSCULOSKELETAL: Unremarkable. SKIN:  Showed normal turgor.  LABORATORY DATA:  Initial basic metabolic panel shows sodium of 139, potassium of 4.7, chloride of 102 with a bicarb of 25, glucose is 187, BUN is 27, creatinine is 1.06.  Cardiac marker; troponin-I less than 0.30, less than 0.30, and less than 3.30 respectively.  Initial CK-MB 6.2 elevated and subsequent CK-MB were within normal range.  Pro BNP 2132.  Magnesium level is 2.0.  Complete blood count with differential showed WBC of 11.6, hemoglobin of 12.8, hematocrit of 37.0, MCV of 78.2 with a platelet count of 226, absolute neutrophil count 7.9 slightly elevated, hemoglobin A1c is 8.8.  Thyroid panel; TSH 3.595 normal.  A repeat complete blood count with no differential done on March 28, 2011 showed WBC of 11.1, hemoglobin of 11.9, hematocrit of 35.4, MCV of 79.2 with a platelet count of 245.  Coagulation profile showed heparin level of 0.38, normal.  PTT 67.  PT 15.4, INR 1.19.  Initial EKG  done on admission showed right bundle-branch block.  IMAGING STUDIES:  Chest x-ray which showed cardiomegaly with normal pulmonary vasculature.  CT angiogram done on March 23, 2011 showed extensive bilateral pulmonary embolism.  There is stable cardiomegaly with chronic interstitial lung disease.  No acute infiltrates seen.  A 2- D echo done on March 23, 2011 showed LVH with moderately decreased systolic function, EF of 40-45% with diffuse hypokinesis, PA pressure is 61 mmHg more in keeping with pulmonary hypertension.  HOSPITAL COURSE:  The patient was admitted to telemetry.  She was started on Lasix 40 mg IV q.12 h. with  potassium chloride of 20 mEq p.o. daily.  She was also placed on Accu-Cheks with regular insulin sliding scale in addition to Lantus insulin 35 units subcu b.i.d.  The patient was also started on IV heparin per protocol and dosing was done by pharmacy.  In addition, she was given breathing treatment as well.  At the time, the patient was seen by me for the very first time in this admission which was on March 17, 2011 Diovan 80 mg p.o. daily, Aldactone 25 mg p.o. b.i.d. was added to the patient's regimen and also added to the patient's anticoagulation was Coumadin and dosing to be done by pharmacy.  On March 26, 2011, Coreg 12.5 mg p.o. b.i.d. was also added to the patient's regimen.  The patient is clinically stable. Anticoagulation is currently being done with Coumadin and heparin and dosages adjusted by pharmacy.  The patient was seen by me today and feels better, denied any specific complaint apart from the patient requested lipid panel to be done.  Examination was essentially unremarkable.  Her vital signs; blood pressure 120/71, temperature is 97.6, pulse is 51, respiratory rate 18, medically stable.  Plan is for the patient to continue anticoagulation with Coumadin and IV heparin, and when INR is at a fairly reasonable level, she will be discharged.  In the meantime, she will be followed and evaluated on daily basis.     Talmage Nap, MD     CN/MEDQ  D:  03/28/2011  T:  03/28/2011  Job:  [id number]  Electronically Signed by Talmage Nap  on 03/28/2011 05:20:31 PM

## 2011-03-29 LAB — CBC
HCT: 37.9 % (ref 36.0–46.0)
Hemoglobin: 12.5 g/dL (ref 12.0–15.0)
MCV: 79.3 fL (ref 78.0–100.0)
RBC: 4.78 MIL/uL (ref 3.87–5.11)
RDW: 15.1 % (ref 11.5–15.5)
WBC: 10.5 10*3/uL (ref 4.0–10.5)

## 2011-03-29 LAB — LIPID PANEL
Cholesterol: 156 mg/dL (ref 0–200)
Total CHOL/HDL Ratio: 3 RATIO

## 2011-03-29 LAB — GLUCOSE, CAPILLARY
Glucose-Capillary: 162 mg/dL — ABNORMAL HIGH (ref 70–99)
Glucose-Capillary: 128 mg/dL — ABNORMAL HIGH (ref 70–99)

## 2011-03-29 LAB — COMPREHENSIVE METABOLIC PANEL
ALT: 17 U/L (ref 0–35)
Alkaline Phosphatase: 48 U/L (ref 39–117)
BUN: 19 mg/dL (ref 6–23)
CO2: 28 mEq/L (ref 19–32)
Chloride: 103 mEq/L (ref 96–112)
GFR calc Af Amer: >60 mL/min (ref >60)
Glucose, Bld: 63 mg/dL — ABNORMAL LOW (ref 70–99)
Potassium: 4.8 mEq/L (ref 3.5–5.1)
Sodium: 142 mEq/L (ref 135–145)
Total Bilirubin: 0.2 mg/dL — ABNORMAL LOW (ref 0.3–1.2)

## 2011-03-29 LAB — DIFFERENTIAL
Basophils Absolute: 0.1 10*3/uL (ref 0.0–0.1)
Eosinophils Relative: 6 % — ABNORMAL HIGH (ref 0–5)
Lymphocytes Relative: 27 % (ref 12–46)
Lymphs Abs: 2.8 10*3/uL (ref 0.7–4.0)
Neutro Abs: 6 10*3/uL (ref 1.7–7.7)

## 2011-03-29 LAB — MAGNESIUM: Magnesium: 2.5 mg/dL (ref 1.5–2.5)

## 2011-03-29 LAB — APTT: aPTT: 88 seconds — ABNORMAL HIGH (ref 24–37)

## 2011-03-30 LAB — DIFFERENTIAL
Basophils Absolute: 0.1 10*3/uL (ref 0.0–0.1)
Basophils Relative: 1 % (ref 0–1)
Eosinophils Absolute: 0.6 10*3/uL (ref 0.0–0.7)
Monocytes Absolute: 1 10*3/uL (ref 0.1–1.0)
Monocytes Relative: 9 % (ref 3–12)
Neutro Abs: 6 10*3/uL (ref 1.7–7.7)
Neutrophils Relative %: 55 % (ref 43–77)

## 2011-03-30 LAB — COMPREHENSIVE METABOLIC PANEL
ALT: 15 U/L (ref 0–35)
AST: 12 U/L (ref 0–37)
Albumin: 3.2 g/dL — ABNORMAL LOW (ref 3.5–5.2)
Alkaline Phosphatase: 46 U/L (ref 39–117)
BUN: 22 mg/dL (ref 6–23)
CO2: 26 mEq/L (ref 19–32)
Sodium: 137 mEq/L (ref 135–145)
Total Bilirubin: 0.1 mg/dL — ABNORMAL LOW (ref 0.3–1.2)

## 2011-03-30 LAB — CBC
Hemoglobin: 12.4 g/dL (ref 12.0–15.0)
MCH: 26.2 pg (ref 26.0–34.0)
MCHC: 33.1 g/dL (ref 30.0–36.0)
Platelets: 244 10*3/uL (ref 150–400)
RBC: 4.74 MIL/uL (ref 3.87–5.11)

## 2011-03-30 LAB — GLUCOSE, CAPILLARY

## 2011-03-30 LAB — PROTIME-INR: Prothrombin Time: 20.8 seconds — ABNORMAL HIGH (ref 11.6–15.2)

## 2011-03-30 LAB — HEPARIN LEVEL (UNFRACTIONATED): Heparin Unfractionated: 0.55 IU/mL (ref 0.30–0.70)

## 2011-03-31 LAB — COMPREHENSIVE METABOLIC PANEL
Albumin: 3.3 g/dL — ABNORMAL LOW (ref 3.5–5.2)
Alkaline Phosphatase: 49 U/L (ref 39–117)
BUN: 24 mg/dL — ABNORMAL HIGH (ref 6–23)
Chloride: 104 mEq/L (ref 96–112)
Glucose, Bld: 71 mg/dL (ref 70–99)
Potassium: 5.1 mEq/L (ref 3.5–5.1)
Total Bilirubin: 0.1 mg/dL — ABNORMAL LOW (ref 0.3–1.2)

## 2011-03-31 LAB — GLUCOSE, CAPILLARY: Glucose-Capillary: 201 mg/dL — ABNORMAL HIGH (ref 70–99)

## 2011-03-31 LAB — CBC
HCT: 37.2 % (ref 36.0–46.0)
Hemoglobin: 12.4 g/dL (ref 12.0–15.0)
RDW: 15.3 % (ref 11.5–15.5)
WBC: 9.7 10*3/uL (ref 4.0–10.5)

## 2011-03-31 LAB — DIFFERENTIAL
Basophils Absolute: 0 10*3/uL (ref 0.0–0.1)
Lymphocytes Relative: 28 % (ref 12–46)
Neutro Abs: 5.4 10*3/uL (ref 1.7–7.7)

## 2011-03-31 LAB — HEPARIN LEVEL (UNFRACTIONATED): Heparin Unfractionated: <0.1 IU/mL — ABNORMAL LOW (ref 0.30–0.70)

## 2011-03-31 LAB — MAGNESIUM: Magnesium: 2.8 mg/dL — ABNORMAL HIGH (ref 1.5–2.5)

## 2011-04-02 NOTE — Discharge Summary (Signed)
  NAMEROSAMAE, ROCQUE                 ACCOUNT NO.:  [id number]  MEDICAL RECORD NO.:  [id number]  LOCATION:  3743                         FACILITY:  [facility]  PHYSICIAN:  Sundra Aland, MD      DATE OF BIRTH:  [birth year]  DATE OF ADMISSION:  03/21/2011 DATE OF DISCHARGE:  03/31/2011                              DISCHARGE SUMMARY   ADDENDUM  INR this morning is at 2.06.  The patient will be discharged.  PHYSICAL EXAMINATION:  VITAL SIGNS:  Blood pressure 105/64, heart rate 60, respirations 20, temperature 97.8, and saturations 97% on room air.  New medication will be Coumadin 5 mg p.o. daily.  __________ family doctor is in [place].  Since she is refusing home health, I discussed the discharge planning with the daughter as well as the patient.  The daughter, __________ is a Health and safety inspector.  She is going to get a PT/INR drawn in another commercial lab or other hospital and their report will be __________ needed to be called to her primary care physician who will adjust her Coumadin as needed.     Sundra Aland, MD     LA/MEDQ  D:  03/31/2011  T:  04/01/2011  Job:  [id number]  Electronically Signed by Sundra Aland MD on 04/02/2011 03:17:44 PM

## 2011-04-02 NOTE — Discharge Summary (Signed)
  Sara Dennis, Sara Dennis                 ACCOUNT NO.:  [id number]  MEDICAL RECORD NO.:  [id number]  LOCATION:  3743                         FACILITY:  [facility]  PHYSICIAN:  Jareli Highland, MD      DATE OF BIRTH:  1922/01/01  DATE OF ADMISSION:  03/21/2011 DATE OF DISCHARGE:  03/31/2011                              DISCHARGE SUMMARY   ADDENDUM  I reviewed the patient's discharge records as well as 2-D echocardiogram and noted that the patient had an echo, but in Feb 03, 2011, with an ejection fraction of 60% to 65%.  All of her repeat echo, which was done on 06/26  showed an ejection fraction of 40% to 45%.  The patient was started on spironolactone as well as ARB and as well as Coreg. __________ continue her on the ARB and spironolactone, this can be reevaluated possibly echo when she is more  stable with her PCP.  DISCHARGE AND FOLLOWUP:  I discussed with her daughter, Annice Pih.  She is a nutritionist and understands the patient is to be recommended diet and also understands the need to recheck her PT/INR by Friday to make she is not overanticoagulated .     Sundra Aland, MD     LA/MEDQ  D:  03/31/2011  T:  04/01/2011  Job:  [id number]  Electronically Signed by Sundra Aland MD on 04/02/2011 03:17:48 PM

## 2011-04-22 NOTE — H&P (Signed)
NAMEREIZY, DUNLOW NO.:  [id number]  MEDICAL RECORD NO.:  [id number]  LOCATION:  [facility]                         FACILITY:  [facility]  PHYSICIAN:  Tarry Kos, MD       DATE OF BIRTH:  [birth year]  DATE OF ADMISSION:  03/21/2011 DATE OF DISCHARGE:                             HISTORY & PHYSICAL   CHIEF COMPLAINT:  Shortness of breath.  HISTORY OF PRESENT ILLNESS:  Ms. Laird is a very pleasant 77 year old African American female with a history of diastolic congestive heart failure that was actually just recently diagnosed last month, insulin- dependent diabetes, who presents to the emergency department after several days of shortness of breath when she gets up and moves around she gets very short of breath and she says that her back feels like it is weak and she is going to give out and she gets diaphoretic with nausea and so she sits down and catches her breath.  She has got multiple family members with her right now in the emergency department including her daughter who is her primary caregiver and whom she lives with, but apparently, she was just discharged from the hospital on Feb 06, 2011, with the diagnosis of pneumonia and diastolic dysfunction. She was sent home on oxygen at that time and then she followed up with her primary care physician and was determined that she did not need oxygen, so the oxygen was taken away.  However, Ms. Skillen drinks 4-5 liters of water a day sometimes, she just feels like she is thirsty all the time and it is usually when her sugars running over 200.  Ms. Manring and her daughter states that they received no congestive heart failure education when she was discharged on her last discharge, so they do not understand that how much fluids every day can cause you to have an exacerbation of your heart failure, so I think what is happening is which she is describing is, especially over the last week when she gets up to go to the bathroom  she gets very short of breath.  She feels like she is going to pass out, she gets weak, she gets diaphoretic and she is probably becoming hypoxic and that is the reason why she is having all these symptoms.  She denies any lower extremity edema.  Denies any chest pain.  Again, she usually is drinking 3 liters of fluid a day.  REVIEW OF SYSTEMS:  Otherwise, negative.  PAST MEDICAL HISTORY:  Again, newly diagnosed diastolic dysfunction with recent hospitalization on Feb 02, 2011.  She was also diagnosed with pneumonia at that time.  She had a 2-D echo done also at that time, which showed a good EF of 60-65% with normal systolic function and grade I diastolic dysfunction with no wall abnormalities. Insulin-dependent diabetes.  SOCIAL HISTORY:  She is a nonsmoker.  No alcohol.  No IV drug abuse. She lives with her daughter.  She is compliant with her medications and her daughter make sure she takes her medications as she is supposed to do.  She again was on oxygen but since her last hospitalization she was only on it for about a  week or so, and then her O2 sats were good in her PCP's office, so then was discharged.  ALLERGIES:  BACTRIM causes a rash, IV DYE.  MEDICATIONS: 1. Aspirin 325 mg a day. 2. Lasix 20 mg a day. 3. Glipizide 5 mg twice a day with meals. 4. She did complete a course of Avelox at her last discharge. 5. KCl 20 mEq daily. 6. Lantus 35 units subcu twice a day. 7. Donepezil 5 mg q.a.m. 8. Ocuvite 1 tablet by mouth daily. 9. Metformin 500 mg extended release nightly.  PHYSICAL EXAMINATION:  VITAL SIGNS:  Her O2 sats when EMS arrived at the house were 78%, she has gotten better currently 90% on 2 liters nasal cannula.  Temperature is 97.5, blood pressure 124/60, pulse 77, respirations 19. GENERAL:  She is alert and oriented x4.  She is in no apparent distress with good color and a good respiratory rate and can speak in full sentences. HEENT:  Extraocular muscles  are intact.  Pupils equal and reactive to light.  Oropharynx clear.  Mucous membranes are moist. NECK:  No JVD.  No carotid bruits. HEART:  Regular rate and rhythm without murmurs, rubs, or  gallops. CHEST:  She has got some bibasilar crackles with good air movement, otherwise, no wheezes, rhonchi, or rales. ABDOMEN:  Soft, nontender, nondistended.  Positive bowel sounds.  No hepatosplenomegaly. EXTREMITIES:  No clubbing, cyanosis or edema. PSYCHIATRIC:  Normal affect. NEUROLOGIC:  No focal neurologic deficits. SKIN:  No rashes.  LABORATORY DATA:  Her pro BNP is elevated at 2132.  Her white count is normal.  Hemoglobin is normal.  Troponin is negative.  CK-MB is marginally elevated at 6.2.  Total CK is normal.  Her BUN and creatinine are 27 and 1.06.  Glucose 187,  sodium level 139, potassium level 4.7. Chest x-ray, no infiltrate and no real significant pulmonary edema.  A 12-lead EKG, right bundle branch block which is old.  ASSESSMENT/PLAN:  This is an 77 year old female with shortness of breath, acute hypoxic respiratory failure, secondary to congestive heart failure exacerbation, diastolic dysfunction. 1. Acute hypoxic respiratory failure secondary to diastolic congestive     heart failure exacerbation.  The most obvious reason for this     exacerbation is most likely due to her fluid intake and her poorly     controlled diabetes.  I am going to write for them to get CHF     education prior to their discharge.  I have spent at least 15     minutes with the family and the patient, explaining the importance     of fluid restriction less than 2 liters a day at least and of     course they did not know this, so now this will probably     dramatically help with her controlling her heart failure symptoms,     as she is drinking at least doubled that sometimes a day.  I have     also explained how her diabetes control was related to her thirst     and how that also controlling that  will help with her symptom of     polydipsia.  We will put her on Lasix 40 mg IV q.12 h for the next     24 hours, this will need be reassessed in the morning and I will     serial her cardiac enzymes.  I have also explained that daily     weights need to be monitored.  I have also explained that we will     try to qualify her for oxygen again at home or ambulate her in the     hallways in the morning and to measure her O2 sats to see if we can     give her oxygen at home again. 2. Insulin-dependent diabetes, continue her Lantus. 3. The patient is full code.  Again, the most important thing prior to     her discharge is to see if she qualifies for oxygen, and she and     her daughter needs some CHF education to reiterate the issues that     I have addressed with them tonight.          ______________________________ Tarry Kos, MD     RD/MEDQ  D:  03/21/2011  T:  03/21/2011  Job:  [id number]  Electronically Signed by Tarry Kos MD on 04/22/2011 11:49:26 AM

## 2011-05-07 ENCOUNTER — Emergency Department (HOSPITAL_COMMUNITY)
Admission: EM | Admit: 2011-05-07 | Discharge: 2011-05-08 | Disposition: A | Discharge status: Home / Self Care | Payer: Medicare Other | Attending: Emergency Medicine | Admitting: Emergency Medicine

## 2011-05-07 DIAGNOSIS — E669 Obesity, unspecified: Secondary | ICD-10-CM | POA: Insufficient documentation

## 2011-05-07 DIAGNOSIS — M7989 Other specified soft tissue disorders: Secondary | ICD-10-CM | POA: Insufficient documentation

## 2011-05-07 DIAGNOSIS — R609 Edema, unspecified: Secondary | ICD-10-CM | POA: Insufficient documentation

## 2011-05-07 DIAGNOSIS — E119 Type 2 diabetes mellitus without complications: Secondary | ICD-10-CM | POA: Insufficient documentation

## 2011-05-07 DIAGNOSIS — I872 Venous insufficiency (chronic) (peripheral): Secondary | ICD-10-CM | POA: Insufficient documentation

## 2011-05-07 DIAGNOSIS — I509 Heart failure, unspecified: Secondary | ICD-10-CM | POA: Insufficient documentation

## 2011-05-07 DIAGNOSIS — L988 Other specified disorders of the skin and subcutaneous tissue: Secondary | ICD-10-CM | POA: Insufficient documentation

## 2011-05-07 DIAGNOSIS — Z794 Long term (current) use of insulin: Secondary | ICD-10-CM | POA: Insufficient documentation

## 2011-05-07 DIAGNOSIS — M79609 Pain in unspecified limb: Secondary | ICD-10-CM | POA: Insufficient documentation

## 2011-05-07 LAB — PRO B NATRIURETIC PEPTIDE: Pro B Natriuretic peptide (BNP): 145.7 pg/mL (ref 0–450)

## 2011-05-07 LAB — POCT I-STAT, CHEM 8
Creatinine, Ser: 1.1 mg/dL (ref 0.50–1.10)
HCT: 38 % (ref 36.0–46.0)
Hemoglobin: 12.9 g/dL (ref 12.0–15.0)
Potassium: 4.5 mEq/L (ref 3.5–5.1)
Sodium: 138 mEq/L (ref 135–145)

## 2011-05-07 LAB — PROTIME-INR
INR: 2.28 — ABNORMAL HIGH (ref 0.00–1.49)
Prothrombin Time: 25.5 seconds — ABNORMAL HIGH (ref 11.6–15.2)

## 2011-05-08 LAB — CBC
Platelets: 242 10*3/uL (ref 150–400)
RDW: 14.7 % (ref 11.5–15.5)
WBC: 8.3 10*3/uL (ref 4.0–10.5)

## 2011-05-08 LAB — DIFFERENTIAL
Basophils Absolute: 0.1 10*3/uL (ref 0.0–0.1)
Basophils Relative: 1 % (ref 0–1)
Eosinophils Absolute: 0.4 10*3/uL (ref 0.0–0.7)
Eosinophils Relative: 4 % (ref 0–5)
Lymphocytes Relative: 36 % (ref 12–46)

## 2011-08-18 ENCOUNTER — Encounter (HOSPITAL_COMMUNITY): Payer: Self-pay | Admitting: Emergency Medicine

## 2011-08-18 ENCOUNTER — Emergency Department (HOSPITAL_COMMUNITY)
Admission: EM | Admit: 2011-08-18 | Discharge: 2011-08-19 | Disposition: A | Discharge status: Home / Self Care | Payer: Medicare Other | Attending: Emergency Medicine | Admitting: Emergency Medicine

## 2011-08-18 DIAGNOSIS — Z7901 Long term (current) use of anticoagulants: Secondary | ICD-10-CM | POA: Insufficient documentation

## 2011-08-18 DIAGNOSIS — Z794 Long term (current) use of insulin: Secondary | ICD-10-CM | POA: Insufficient documentation

## 2011-08-18 DIAGNOSIS — E119 Type 2 diabetes mellitus without complications: Secondary | ICD-10-CM | POA: Insufficient documentation

## 2011-08-18 DIAGNOSIS — N201 Calculus of ureter: Secondary | ICD-10-CM | POA: Insufficient documentation

## 2011-08-18 DIAGNOSIS — N1330 Unspecified hydronephrosis: Secondary | ICD-10-CM | POA: Insufficient documentation

## 2011-08-18 DIAGNOSIS — R109 Unspecified abdominal pain: Secondary | ICD-10-CM | POA: Insufficient documentation

## 2011-08-18 DIAGNOSIS — N200 Calculus of kidney: Secondary | ICD-10-CM

## 2011-08-18 DIAGNOSIS — Z79899 Other long term (current) drug therapy: Secondary | ICD-10-CM | POA: Insufficient documentation

## 2011-08-18 DIAGNOSIS — I509 Heart failure, unspecified: Secondary | ICD-10-CM | POA: Insufficient documentation

## 2011-08-18 LAB — DIFFERENTIAL
Eosinophils Absolute: 0.2 10*3/uL (ref 0.0–0.7)
Eosinophils Relative: 3 % (ref 0–5)
Lymphs Abs: 2.7 10*3/uL (ref 0.7–4.0)
Monocytes Absolute: 0.6 10*3/uL (ref 0.1–1.0)

## 2011-08-18 LAB — CBC
HCT: 37.6 % (ref 36.0–46.0)
MCH: 25.5 pg — ABNORMAL LOW (ref 26.0–34.0)
MCV: 80.7 fL (ref 78.0–100.0)
Platelets: 260 10*3/uL (ref 150–400)
RBC: 4.66 MIL/uL (ref 3.87–5.11)
RDW: 15.4 % (ref 11.5–15.5)

## 2011-08-18 LAB — COMPREHENSIVE METABOLIC PANEL
AST: 23 U/L (ref 0–37)
Albumin: 3.9 g/dL (ref 3.5–5.2)
Alkaline Phosphatase: 51 U/L (ref 39–117)
Chloride: 102 mEq/L (ref 96–112)
Potassium: 4.5 mEq/L (ref 3.5–5.1)
Sodium: 138 mEq/L (ref 135–145)
Total Bilirubin: 0.1 mg/dL — ABNORMAL LOW (ref 0.3–1.2)
Total Protein: 7.6 g/dL (ref 6.0–8.3)

## 2011-08-18 NOTE — ED Notes (Signed)
PT. REPORTS RIGHT FLANK PAIN X 3 WEEKS - WORSE TODAY ,  DENIES INJURY OR FALL , NO DYSURIA , DENIES FEVER OR CHILLS.

## 2011-08-19 ENCOUNTER — Encounter (HOSPITAL_COMMUNITY): Payer: Self-pay | Admitting: Emergency Medicine

## 2011-08-19 ENCOUNTER — Emergency Department (HOSPITAL_COMMUNITY): Payer: Medicare Other

## 2011-08-19 LAB — PROTIME-INR
INR: 2.45 — ABNORMAL HIGH (ref 0.00–1.49)
Prothrombin Time: 27 seconds — ABNORMAL HIGH (ref 11.6–15.2)

## 2011-08-19 LAB — URINALYSIS, ROUTINE W REFLEX MICROSCOPIC
Bilirubin Urine: NEGATIVE
Glucose, UA: NEGATIVE mg/dL
Ketones, ur: NEGATIVE mg/dL
Leukocytes, UA: NEGATIVE
Protein, ur: NEGATIVE mg/dL
pH: 5.5 (ref 5.0–8.0)

## 2011-08-19 MED ORDER — HYDROCODONE-ACETAMINOPHEN 5-325 MG PO TABS: 2.0000 | ORAL_TABLET | ORAL | Status: AC | PRN

## 2011-08-19 MED ORDER — HYDROCODONE-ACETAMINOPHEN 5-325 MG PO TABS
1.0000 | ORAL_TABLET | Freq: Once | ORAL | Status: AC
  Administered 2011-08-19: 1 via ORAL
  Filled 2011-08-19: qty 1

## 2011-08-19 NOTE — ED Notes (Signed)
Patient given copy of discharge paperwork.  Went over discharge instructions with patient; instructed patient to follow up with urologist on Friday, to take Norco as directed, and to return to the ED for new, concerning, or worsening symptoms.

## 2011-08-19 NOTE — ED Provider Notes (Signed)
History     CSN: [id number] Arrival date & time: 08/18/2011  8:45 PM   First MD Initiated Contact with Patient 08/19/11 0103      Chief Complaint  Patient presents with  . Flank Pain   HPI  History provided by the patient. Patient reports having some intermittent right lower side and back pains past 3 weeks. Pain became acutely worse over the last 2 days. Pain is worse with certain movements and position. Patient denies dysuria, urinary frequency, hematuria. He denies fever, chills, sweats. Patient has no nausea or vomiting. Patient denies any strenuous activity or trauma.   Past Medical History  Diagnosis Date  . SOB (shortness of breath)   . CHF (congestive heart failure)   . Diabetes mellitus     History reviewed. No pertinent past surgical history.  History reviewed. No pertinent family history.  History  Substance Use Topics  . Smoking status: Never Smoker   . Smokeless tobacco: Not on file  . Alcohol Use: No    OB History    Grav Para Term Preterm Abortions TAB SAB Ect Mult Living                  Review of Systems  Constitutional: Negative for fever and chills.  Respiratory: Negative for cough and shortness of breath.   Cardiovascular: Negative for chest pain.  Gastrointestinal: Negative for nausea, vomiting, abdominal pain, diarrhea and constipation.  Genitourinary: Positive for flank pain. Negative for dysuria, frequency and hematuria.  Neurological: Negative for dizziness and light-headedness.    Allergies  Bactrim; Iohexol; and Ivp dye  Home Medications   Current Outpatient Rx  Name Route Sig Dispense Refill  . DONEPEZIL HCL 5 MG PO TABS Oral Take 5 mg by mouth daily.      . FUROSEMIDE 40 MG PO TABS Oral Take 40 mg by mouth daily.      . INSULIN GLARGINE 100 UNIT/ML [place] SOLN Subcutaneous Inject 35 Units into the skin 2 (two) times daily.      Marland Kitchen POTASSIUM CHLORIDE 20 MEQ PO PACK Oral Take 20 mEq by mouth 2 (two) times daily.      Marland Kitchen TRIMETHOPRIM  100 MG PO TABS Oral Take 100 mg by mouth daily.      . WARFARIN SODIUM 5 MG PO TABS Oral Take 5 mg by mouth daily.        BP 178/63  Pulse 75  Temp(Src) 97.4 F (36.3 C) (Oral)  Resp 24  SpO2 96%  Physical Exam  Nursing note and vitals reviewed. Constitutional: She is oriented to person, place, and time. She appears well-developed and well-nourished. No distress.  HENT:  Head: Normocephalic.  Cardiovascular: Normal rate and regular rhythm.   No murmur heard. Pulmonary/Chest: Effort normal. She has no wheezes. She has no rales.  Abdominal: Soft. She exhibits no distension and no mass. There is no tenderness. There is CVA tenderness. There is no rebound and no guarding.  Neurological: She is alert and oriented to person, place, and time.  Skin: Skin is warm. No rash noted.  Psychiatric: She has a normal mood and affect.    ED Course  Procedures (including critical care time)  Labs Reviewed  COMPREHENSIVE METABOLIC PANEL - Abnormal; Notable for the following:    Glucose, Bld 190 (*)    BUN 26 (*)    Creatinine, Ser 1.58 (*)    Total Bilirubin 0.1 (*)    GFR calc non Af Amer 28 (*)  GFR calc Af Amer 33 (*)    All other components within normal limits  CBC - Abnormal; Notable for the following:    Hemoglobin 11.9 (*)    MCH 25.5 (*)    All other components within normal limits  DIFFERENTIAL  URINALYSIS, ROUTINE W REFLEX MICROSCOPIC    Results for orders placed during the hospital encounter of 08/18/11  COMPREHENSIVE METABOLIC PANEL      Component Value Range   Sodium 138  135 - 145 (mEq/L)   Potassium 4.5  3.5 - 5.1 (mEq/L)   Chloride 102  96 - 112 (mEq/L)   CO2 25  19 - 32 (mEq/L)   Glucose, Bld 190 (*) 70 - 99 (mg/dL)   BUN 26 (*) 6 - 23 (mg/dL)   Creatinine, Ser 0.96 (*) 0.50 - 1.10 (mg/dL)   Calcium 9.6  8.4 - 04.5 (mg/dL)   Total Protein 7.6  6.0 - 8.3 (g/dL)   Albumin 3.9  3.5 - 5.2 (g/dL)   AST 23  0 - 37 (U/L)   ALT 14  0 - 35 (U/L)   Alkaline  Phosphatase 51  39 - 117 (U/L)   Total Bilirubin 0.1 (*) 0.3 - 1.2 (mg/dL)   GFR calc non Af Amer 28 (*) >90 (mL/min)   GFR calc Af Amer 33 (*) >90 (mL/min)  CBC      Component Value Range   WBC 8.9  4.0 - 10.5 (K/uL)   RBC 4.66  3.87 - 5.11 (MIL/uL)   Hemoglobin 11.9 (*) 12.0 - 15.0 (g/dL)   HCT 40.9  81.1 - 91.4 (%)   MCV 80.7  78.0 - 100.0 (fL)   MCH 25.5 (*) 26.0 - 34.0 (pg)   MCHC 31.6  30.0 - 36.0 (g/dL)   RDW 78.2  95.6 - 21.3 (%)   Platelets 260  150 - 400 (K/uL)  DIFFERENTIAL      Component Value Range   Neutrophils Relative 60  43 - 77 (%)   Neutro Abs 5.3  1.7 - 7.7 (K/uL)   Lymphocytes Relative 30  12 - 46 (%)   Lymphs Abs 2.7  0.7 - 4.0 (K/uL)   Monocytes Relative 6  3 - 12 (%)   Monocytes Absolute 0.6  0.1 - 1.0 (K/uL)   Eosinophils Relative 3  0 - 5 (%)   Eosinophils Absolute 0.2  0.0 - 0.7 (K/uL)   Basophils Relative 1  0 - 1 (%)   Basophils Absolute 0.1  0.0 - 0.1 (K/uL)  URINALYSIS, ROUTINE W REFLEX MICROSCOPIC      Component Value Range   Color, Urine YELLOW  YELLOW    Appearance CLEAR  CLEAR    Specific Gravity, Urine 1.016  1.005 - 1.030    pH 5.5  5.0 - 8.0    Glucose, UA NEGATIVE  NEGATIVE (mg/dL)   Hgb urine dipstick NEGATIVE  NEGATIVE    Bilirubin Urine NEGATIVE  NEGATIVE    Ketones, ur NEGATIVE  NEGATIVE (mg/dL)   Protein, ur NEGATIVE  NEGATIVE (mg/dL)   Urobilinogen, UA 0.2  0.0 - 1.0 (mg/dL)   Nitrite NEGATIVE  NEGATIVE    Leukocytes, UA NEGATIVE  NEGATIVE      Ct Abdomen Pelvis Wo Contrast  08/19/2011  *RADIOLOGY REPORT*  Clinical Data: Right flank pain.  CT ABDOMEN AND PELVIS WITHOUT CONTRAST  Technique:  Multidetector CT imaging of the abdomen and pelvis was performed following the standard protocol without intravenous contrast.  Comparison: None.  Findings: Peripheral bibasilar  scarring.  Aortic valve atherosclerotic calcification.  Intra-abdominal organ evaluation is limited without intravenous contrast.  Punctate calcifications within  the liver are in keeping with prior granulomas infection.  Decompressed gallbladder.  No biliary ductal dilatation.  Unremarkable pancreas and adrenal glands.  Unremarkable left kidney.  The right kidney contains several hypodensities, the largest of which measures 2 cm and is in keeping with a simple cyst.  The smaller of which are too small to further characterize.  There is mild right renal edema and mild hydroureteronephrosis to the level of a proximal right ureteral stone measuring 8 mm.  No bowel obstruction.  No CT evidence for colitis. Normal appendix. No right lower quadrant inflammation to suggest appendicitis.  No free intraperitoneal air or fluid.  Unremarkable uterus and adnexa.  Thin-walled bladder.  Small fat containing umbilical hernia.  There is scattered atherosclerotic calcification of the aorta and its branches. No aneurysmal dilatation.  Multilevel degenerative changes of the imaged spine. No acute or aggressive appearing osseous lesion.  IMPRESSION: Mild right hydroureteronephrosis to the level of an 8 mm proximal right ureteral stone.  Original Report Authenticated By: Waneta Martins, M.D.     1. Kidney stone     MDM  Patient seen and evaluated. Patient in no acute distress.  Patient discussed with attending physician. Patient without pain after 1 Vicodin. CT scan shows 8 mm obstructing stone. At this time patient requesting to return home. Plan have patient followup closely with urology specialist. Patient advised to return to the emergency room if pain returns and is not controlled at home or at she develops fever, chills, persistent nausea vomiting. She also advised to consider returning to the [facility] long emergency room if possible.      Phill Mutter [place], [place] 08/19/11 [phone]

## 2011-08-19 NOTE — ED Notes (Signed)
Pt c/o right flank pain for several days, denies injury or lifting anything heavy.  Pt st's pain worse with movement.

## 2011-08-19 NOTE — ED Provider Notes (Signed)
Medical screening examination/treatment/procedure(s) were performed by non-physician practitioner and as supervising physician I was immediately available for consultation/collaboration.   Delva Derden D Luvinia Lucy, MD 08/19/11 0803 

## 2011-08-19 NOTE — ED Notes (Signed)
Received report from Selena Batten, Charity fundraiser.  Patient currently sitting up in bed; no respiratory or acute distress noted.  Patient assisted to restroom.  Patient has no other questions or concerns at this time.  Will continue to monitor.

## 2011-09-03 ENCOUNTER — Other Ambulatory Visit: Payer: Self-pay | Admitting: Urology

## 2011-09-03 ENCOUNTER — Encounter (HOSPITAL_COMMUNITY): Payer: Self-pay | Admitting: *Deleted

## 2011-09-03 ENCOUNTER — Encounter (HOSPITAL_COMMUNITY): Payer: Self-pay | Admitting: Pharmacy Technician

## 2011-09-03 NOTE — Pre-Procedure Instructions (Signed)
Pt states dr Isabel Caprice told her not to stop aspirin and coumadin, but do not take morning of surgery. Cbc with dif 08-18-2011 in epic Pt instructed hibiclens shower hs night before surgery and am of surgery neck down avoid private area.

## 2011-09-05 NOTE — H&P (Signed)
Chief Complaint  Seen today as an ER follow-up.   Active Problems Problems  1. Hydronephrosis On The Right 591 2. Pyuria 791.9 3. Ureteral Stone 592.1  History of Present Illness           77 YO diabetic female seen today as an ER follow-up. Was seen in the ER 08/19/11 flank pain. CT urogram: mild right hydronephrosis secondary to an 8 mm proximal right ureteral calculus. Is on chronic coumadin for DVT/PE. Has h/o nephrolithasis that was treated by cystouretescopy, stone extraction in 1978.   Interval HX: Today states she has been pain free for over 1 week. Denies hematuria or passage of stone material.   Past Medical History Problems  1. History of  Arthritis V13.4 2. History of  Congestive Heart Failure 428.0 3. History of  Diabetes Mellitus 250.00 4. History of  Pulmonary Edema 514 5. 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 History of  Cystoscopy With Ureteroscopy  Current Meds 1. Bayer Aspirin TABS; Therapy: (Recorded:06Dec2012) to 2. Donepezil HCl 5 MG Oral Tablet; Therapy: (Recorded:06Dec2012) to 3. Furosemide 40 MG Oral Tablet; Therapy: (Recorded:06Dec2012) to 4. GlipiZIDE ER 10 MG Oral Tablet Extended Release 24 Hour; Therapy: (Recorded:06Dec2012) to 5. Hydrocodone-Acetaminophen 5-325 MG Oral Tablet; Therapy: (Recorded:06Dec2012) to 6. Klor-Con 20 MEQ Oral Packet; Therapy: (Recorded:06Dec2012) to 7. Lantus 100 UNIT/ML Subcutaneous Solution; Therapy: (Recorded:06Dec2012) to 8. MetFORMIN HCl ER 500 MG Oral Tablet Extended Release 24 Hour; Therapy:  (Recorded:06Dec2012) to 9. Ocuvite TABS; Therapy: (Recorded:06Dec2012) to 10. Trimethoprim 100 MG Oral Tablet; Therapy: (Recorded:06Dec2012) to 11. Warfarin Sodium 5 MG Oral Tablet; Therapy: (Recorded:06Dec2012) to  Allergies Non-Medication  1. Contrast Dye  Family History Problems  1. Maternal history of  Nephrolithiasis  Social History Problems     Caffeine Use   Marital History - Widowed   Never A Smoker   Occupation: Retired Denied    History of  Alcohol Use  Review of Systems Genitourinary, constitutional, skin, eye, otolaryngeal, hematologic/lymphatic, cardiovascular, pulmonary, endocrine, musculoskeletal, gastrointestinal, neurological and psychiatric system(s) were reviewed and pertinent findings if present are noted.    Vitals Vital Signs [Data Includes: Last 1 Day]  06Dec2012 02:09PM  BMI Calculated: 3.86 BSA Calculated: 0.81 Height: 5 ft 6 in Weight: 24 lb  Blood Pressure: 137 / 52 Temperature: 98.6 F Heart Rate: 65  Physical Exam Constitutional: Well nourished and well developed . No acute distress. The patient appears well hydrated.  ENT:. The ears and nose are normal in appearance.  Neck: The appearance of the neck is normal.  Pulmonary: No respiratory distress.  Cardiovascular: Heart rate and rhythm are normal.  Abdomen: The abdomen is obese. The abdomen is soft and nontender. No suprapubic tenderness. No CVA tenderness.  Genitourinary:. Deferred.  Skin: Normal skin turgor and normal skin color and pigmentation.  Neuro/Psych:. Mood and affect are appropriate.    Results/Data Urine [Data Includes: Last 1 Day]   06Dec2012  COLOR YELLOW   APPEARANCE CLOUDY   SPECIFIC GRAVITY 1.025   pH 5.5   GLUCOSE NEG mg/dL  BILIRUBIN NEG   KETONE NEG mg/dL  BLOOD LARGE   PROTEIN 30 mg/dL  UROBILINOGEN 0.2 mg/dL  NITRITE NEG   LEUKOCYTE ESTERASE LARGE   SQUAMOUS EPITHELIAL/HPF FEW   WBC TNTC WBC/hpf  RBC 4-6 RBC/hpf  BACTERIA MANY   CRYSTALS NONE SEEN   CASTS NONE SEEN    Old records or history reviewed: Reviewed ER records.  The following images/tracing/specimen were  independently visualized:  KUB: right ureteral calculs noted @ L4.  The following clinical lab reports were reviewed:  UA C&S.  The following radiology reports were reviewed: Personally reviewed CT urogram 08/19/11.    Assessment Assessed   1. Ureteral Stone 592.1 2. Pyuria 791.9 3. Hydronephrosis On The Right 591  Plan   Discussion/Summary   Sara Dennis fortunately is doing well clinically. She has a 5 x 10 mm stone in her proximal right ureter at this point based on KUB yesterday. She does have some pyuria and her urine culture remains pending. I think it would be prudent to go ahead and put her on a 1 a day antibiotic with something like Keflex pending those culture results. The choice for treatment really comes down to lithotripsy versus ureteroscopy. Lithotripsy would require discontinuation of her Coumadin. She had presented within the past 6 months with bilateral pulmonary emboli and apparently has a family history of DVT and pulmonary emboli. She seems to be quite a high risk patient to discontinue her anticoagulation. For that reason I think the safest way to proceed would be to go ahead and attempt ureteroscopy and have her remain on her Coumadin. If she gets into issues with voiding, then that procedure can be stopped and a stent placed to at least un-obstruct her kidney and then consider an alternative approach which may end up ultimately being lithotripsy with bridging with Lovenox and hospitalization in order to do this in the most safe and expeditious manner. I do think, again, however, that a ureteroscopic attempt would make more sense, especially if we can keep her from having to stop the anticoagulation. We will try to set that up for sometime in the next week or two.

## 2011-09-06 ENCOUNTER — Encounter (HOSPITAL_COMMUNITY): Payer: Self-pay | Admitting: *Deleted

## 2011-09-06 ENCOUNTER — Encounter (HOSPITAL_COMMUNITY)
Admission: RE | Disposition: A | Discharge status: Home / Self Care | Payer: Self-pay | Source: Ambulatory Visit | Attending: Urology

## 2011-09-06 ENCOUNTER — Ambulatory Visit (HOSPITAL_COMMUNITY): Payer: Medicare Other

## 2011-09-06 ENCOUNTER — Ambulatory Visit (HOSPITAL_COMMUNITY)
Admission: RE | Admit: 2011-09-06 | Discharge: 2011-09-06 | Disposition: A | Discharge status: Home / Self Care | Payer: Medicare Other | Source: Ambulatory Visit | Attending: Urology | Admitting: Urology

## 2011-09-06 ENCOUNTER — Ambulatory Visit (HOSPITAL_COMMUNITY): Payer: Medicare Other | Admitting: *Deleted

## 2011-09-06 ENCOUNTER — Other Ambulatory Visit: Payer: Self-pay

## 2011-09-06 DIAGNOSIS — R1031 Right lower quadrant pain: Secondary | ICD-10-CM | POA: Insufficient documentation

## 2011-09-06 DIAGNOSIS — Z794 Long term (current) use of insulin: Secondary | ICD-10-CM | POA: Insufficient documentation

## 2011-09-06 DIAGNOSIS — N1330 Unspecified hydronephrosis: Secondary | ICD-10-CM | POA: Insufficient documentation

## 2011-09-06 DIAGNOSIS — Z79899 Other long term (current) drug therapy: Secondary | ICD-10-CM | POA: Insufficient documentation

## 2011-09-06 DIAGNOSIS — Z7901 Long term (current) use of anticoagulants: Secondary | ICD-10-CM | POA: Insufficient documentation

## 2011-09-06 DIAGNOSIS — N201 Calculus of ureter: Secondary | ICD-10-CM | POA: Diagnosis present

## 2011-09-06 DIAGNOSIS — Z7982 Long term (current) use of aspirin: Secondary | ICD-10-CM | POA: Insufficient documentation

## 2011-09-06 DIAGNOSIS — I509 Heart failure, unspecified: Secondary | ICD-10-CM | POA: Insufficient documentation

## 2011-09-06 DIAGNOSIS — Z86718 Personal history of other venous thrombosis and embolism: Secondary | ICD-10-CM | POA: Insufficient documentation

## 2011-09-06 DIAGNOSIS — J811 Chronic pulmonary edema: Secondary | ICD-10-CM | POA: Insufficient documentation

## 2011-09-06 DIAGNOSIS — E119 Type 2 diabetes mellitus without complications: Secondary | ICD-10-CM | POA: Insufficient documentation

## 2011-09-06 HISTORY — PX: CYSTOSCOPY W/ URETERAL STENT PLACEMENT: SHX1429

## 2011-09-06 HISTORY — DX: Other pulmonary embolism without acute cor pulmonale: I26.99

## 2011-09-06 HISTORY — DX: Unspecified osteoarthritis, unspecified site: M19.90

## 2011-09-06 HISTORY — PX: URETEROSCOPY: SHX842

## 2011-09-06 LAB — APTT: aPTT: 33 seconds (ref 24–37)

## 2011-09-06 LAB — COMPREHENSIVE METABOLIC PANEL
AST: 22 U/L (ref 0–37)
Albumin: 3.4 g/dL — ABNORMAL LOW (ref 3.5–5.2)
BUN: 17 mg/dL (ref 6–23)
Calcium: 9.6 mg/dL (ref 8.4–10.5)
Creatinine, Ser: 1.08 mg/dL (ref 0.50–1.10)

## 2011-09-06 LAB — GLUCOSE, CAPILLARY: Glucose-Capillary: 165 mg/dL — ABNORMAL HIGH (ref 70–99)

## 2011-09-06 LAB — PROTIME-INR
INR: 2.23 — ABNORMAL HIGH (ref 0.00–1.49)
Prothrombin Time: 25.1 seconds — ABNORMAL HIGH (ref 11.6–15.2)

## 2011-09-06 LAB — SURGICAL PCR SCREEN
MRSA, PCR: NEGATIVE
Staphylococcus aureus: NEGATIVE

## 2011-09-06 SURGERY — CYSTOSCOPY, WITH RETROGRADE PYELOGRAM AND URETERAL STENT INSERTION
Anesthesia: General | Site: Ureter | Laterality: Right | Wound class: Clean Contaminated

## 2011-09-06 MED ORDER — INSULIN ASPART 100 UNIT/ML ~~LOC~~ SOLN: 0.0000 [IU] | SUBCUTANEOUS | Status: DC

## 2011-09-06 MED ORDER — CIPROFLOXACIN IN D5W 400 MG/200ML IV SOLN
INTRAVENOUS | Status: AC
  Filled 2011-09-06: qty 200

## 2011-09-06 MED ORDER — HYDROMORPHONE HCL PF 1 MG/ML IJ SOLN: 0.2500 mg | INTRAMUSCULAR | Status: DC | PRN

## 2011-09-06 MED ORDER — IOHEXOL 300 MG/ML  SOLN
INTRAMUSCULAR | Status: DC | PRN
  Administered 2011-09-06: 50 mL

## 2011-09-06 MED ORDER — FENTANYL CITRATE 0.05 MG/ML IJ SOLN
INTRAMUSCULAR | Status: DC | PRN
  Administered 2011-09-06 (×3): 50 ug via INTRAVENOUS
  Administered 2011-09-06 (×2): 25 ug via INTRAVENOUS

## 2011-09-06 MED ORDER — IOHEXOL 300 MG/ML  SOLN
INTRAMUSCULAR | Status: AC
Start: 2011-09-06 — End: 2011-09-06
  Filled 2011-09-06: qty 1

## 2011-09-06 MED ORDER — LACTATED RINGERS IV SOLN
INTRAVENOUS | Status: DC | PRN
  Administered 2011-09-06: 10:00:00 via INTRAVENOUS

## 2011-09-06 MED ORDER — CIPROFLOXACIN IN D5W 400 MG/200ML IV SOLN
400.0000 mg | INTRAVENOUS | Status: AC
  Administered 2011-09-06: 400 mg via INTRAVENOUS

## 2011-09-06 MED ORDER — ONDANSETRON HCL 4 MG/2ML IJ SOLN
INTRAMUSCULAR | Status: DC | PRN
  Administered 2011-09-06: 4 mg via INTRAVENOUS

## 2011-09-06 MED ORDER — PROMETHAZINE HCL 25 MG/ML IJ SOLN: 6.2500 mg | INTRAMUSCULAR | Status: DC | PRN

## 2011-09-06 MED ORDER — EPHEDRINE SULFATE 50 MG/ML IJ SOLN
INTRAMUSCULAR | Status: DC | PRN
  Administered 2011-09-06: 5 mg via INTRAVENOUS

## 2011-09-06 MED ORDER — SODIUM CHLORIDE 0.9 % IR SOLN
Status: DC | PRN
  Administered 2011-09-06: 6000 mL

## 2011-09-06 MED ORDER — PROPOFOL 10 MG/ML IV BOLUS
INTRAVENOUS | Status: DC | PRN
  Administered 2011-09-06: 160 mg via INTRAVENOUS

## 2011-09-06 MED ORDER — LIDOCAINE HCL 1 % IJ SOLN
INTRAMUSCULAR | Status: DC | PRN
  Administered 2011-09-06: 50 mg via INTRADERMAL

## 2011-09-06 MED ORDER — MUPIROCIN 2 % EX OINT
TOPICAL_OINTMENT | CUTANEOUS | Status: AC
  Filled 2011-09-06: qty 22

## 2011-09-06 SURGICAL SUPPLY — 24 items
ADAPTER CATH URET PLST 4-6FR (CATHETERS) ×2 IMPLANT
BAG URO CATCHER STRL LF (DRAPE) ×2 IMPLANT
BASKET STONE NITINOL 3FR/115 (UROLOGICAL SUPPLIES) ×2 IMPLANT
CATH URET 5FR 28IN CONE TIP (BALLOONS)
CATH URET 5FR 28IN OPEN ENDED (CATHETERS) ×2 IMPLANT
CATH URET 5FR 70CM CONE TIP (BALLOONS) IMPLANT
CATH URET WHISTLE 5FR 28IN (CATHETERS) IMPLANT
CATH URET WHISTLE 6FR (CATHETERS) ×2 IMPLANT
CLOTH BEACON ORANGE TIMEOUT ST (SAFETY) ×2 IMPLANT
DRAPE CAMERA CLOSED 9X96 (DRAPES) ×2 IMPLANT
GLOVE BIOGEL M STRL SZ7.5 (GLOVE) ×2 IMPLANT
GLOVE SURG SS PI 8.0 STRL IVOR (GLOVE) ×2 IMPLANT
GOWN PREVENTION PLUS XLARGE (GOWN DISPOSABLE) ×2 IMPLANT
GOWN STRL NON-REIN LRG LVL3 (GOWN DISPOSABLE) ×2 IMPLANT
GOWN STRL REIN XL XLG (GOWN DISPOSABLE) ×2 IMPLANT
GUIDEWIRE STR DUAL SENSOR (WIRE) ×2 IMPLANT
LASER FIBER DISP (UROLOGICAL SUPPLIES) ×4 IMPLANT
MANIFOLD NEPTUNE II (INSTRUMENTS) ×2 IMPLANT
MARKER SKIN DUAL TIP RULER LAB (MISCELLANEOUS) IMPLANT
PACK CYSTO (CUSTOM PROCEDURE TRAY) ×2 IMPLANT
SHEATH ACCESS URETERAL 38CM (SHEATH) ×2 IMPLANT
STENT CONTOUR 6FRX24X.038 (STENTS) ×2 IMPLANT
TUBING CONNECTING 10 (TUBING) ×2 IMPLANT
WIRE COONS/BENSON .038X145CM (WIRE) IMPLANT

## 2011-09-06 NOTE — Op Note (Signed)
Preoperative diagnosis: Right proximal ureteral calculus Postoperative diagnosis: Same  Procedure: Cystoscopy, rigid and flexible ureteroscopy, holmium laser lithotripsy and double-J stent placement   Surgeon: Valetta Fuller M.D.  Anesthesia: Gen.  Indications: Ms. Sara Dennis is 77 years of age. She presented with right flank pain and was diagnosed with a large 5 x 10 mm stone in her right proximal ureter. She is on anticoagulation due to DVT and pulmonary embolus. We felt she was high risk to discontinue her anticoagulation and therefore recommended ureteroscopy with holmium laser lithotripsy while remaining on her anticoagulation. The various treatment options for stone were discussed with her in detail. Full informed consent obtained. She presents now for the procedure.     Technique and findings: The patient was brought to the operating room. PAS compression boots were placed. She received perioperative ciprofloxacin. She had successful induction of general anesthesia. She was placed in lithotomy position and prepped and draped in usual manner. Appropriate surgical timeout was performed. With fluoroscopy one could still see the stone in the proximal ureter. A guidewire was placed up to the right renal pelvis. The stone appeared to migrate more proximally. Rigid ureteroscopy was performed. The stone was encountered right at the ureteropelvic junction. We initiated holmium laser lithotripsy with 365  fiber but the stone soon migrated more proximally into the renal pelvis. A digital flexible ureteroscope access sheath was then placed. The flexible digital ureteroscope was then used. The stone was then broken up high into approximately a dozen pieces utilizing the holmium laser lithotripter. No obvious Caucasian for problems occur. A 6 French 24 cm double-J stent was placed with fluoroscopic as well as visual guidance. The patient was brought to recovery room stable condition.

## 2011-09-06 NOTE — Interval H&P Note (Signed)
History and Physical Interval Note:  09/06/2011 7:56 AM  Sara Dennis  has presented today for surgery, with the diagnosis of right ureteral calculus  The various methods of treatment have been discussed with the patient and family. After consideration of risks, benefits and other options for treatment, the patient has consented to  Procedure(s): CYSTOSCOPY WITH RETROGRADE PYELOGRAM/URETERAL STENT PLACEMENT URETEROSCOPY HOLMIUM LASER APPLICATION as a surgical intervention .  The patients' history has been reviewed, patient examined, no change in status, stable for surgery.  I have reviewed the patients' chart and labs.  Questions were answered to the patient's satisfaction.     Carnel Stegman S

## 2011-09-06 NOTE — Anesthesia Postprocedure Evaluation (Signed)
  Anesthesia Post-op Note  Patient: Sara Dennis  Procedure(s) Performed:  CYSTOSCOPY WITH RETROGRADE PYELOGRAM/URETERAL STENT PLACEMENT - cysto double j stent placement; URETEROSCOPY; HOLMIUM LASER APPLICATION - holmium laser lithotripsy  Patient Location: PACU  Anesthesia Type: General  Level of Consciousness: oriented and sedated  Airway and Oxygen Therapy: Patient Spontanous Breathing and Patient connected to nasal cannula oxygen  Post-op Pain: none  Post-op Assessment: Post-op Vital signs reviewed, Patient's Cardiovascular Status Stable, Respiratory Function Stable and Patent Airway  Post-op Vital Signs: stable  Complications: No apparent anesthesia complications

## 2011-09-06 NOTE — Progress Notes (Signed)
Report received for lunch relief.

## 2011-09-06 NOTE — Transfer of Care (Signed)
Immediate Anesthesia Transfer of Care Note  Patient: Sara Dennis  Procedure(s) Performed:  CYSTOSCOPY WITH RETROGRADE PYELOGRAM/URETERAL STENT PLACEMENT - cysto double j stent placement; URETEROSCOPY; HOLMIUM LASER APPLICATION - holmium laser lithotripsy  Patient Location: PACU  Anesthesia Type: General  Level of Consciousness: awake and oriented  Airway & Oxygen Therapy: Patient Spontanous Breathing and Patient connected to face mask oxygen  Post-op Assessment: Report given to PACU RN  Post vital signs: Reviewed and stable  Complications: No apparent anesthesia complications

## 2011-09-06 NOTE — Progress Notes (Signed)
PAS hose removed when pt got up to BR

## 2011-09-06 NOTE — Anesthesia Preprocedure Evaluation (Signed)
Anesthesia Evaluation  Patient identified by MRN, date of birth, ID band Patient awake  General Assessment Comment:Advanced years  Reviewed: Allergy & Precautions, H&P , NPO status , Patient's Chart, lab work & pertinent test results, reviewed documented beta blocker date and time   Airway Mallampati: II TM Distance: >3 FB Neck ROM: Limited    Dental   Pulmonary neg pulmonary ROS,    + decreased breath sounds      Cardiovascular +CHF Regular Normal Denies cardiac symptoms   Neuro/Psych Negative Neurological ROS  Negative Psych ROS   GI/Hepatic negative GI ROS, Neg liver ROS,   Endo/Other  Diabetes mellitus-, Well Controlled, Type obesityObesity  Renal/GU Kidney Stone   Genitourinary negative   Musculoskeletal negative musculoskeletal ROS (+)   Abdominal   Peds negative pediatric ROS (+)  Hematology negative hematology ROS (+)   Anesthesia Other Findings   Reproductive/Obstetrics negative OB ROS                           Anesthesia Physical Anesthesia Plan  ASA: III  Anesthesia Plan: General   Post-op Pain Management:    Induction: Intravenous  Airway Management Planned: LMA  Additional Equipment:   Intra-op Plan:   Post-operative Plan: Extubation in OR  Informed Consent: I have reviewed the patients History and Physical, chart, labs and discussed the procedure including the risks, benefits and alternatives for the proposed anesthesia with the patient or authorized representative who has indicated his/her understanding and acceptance.     Plan Discussed with: CRNA and Surgeon  Anesthesia Plan Comments:         Anesthesia Quick Evaluation

## 2011-09-07 ENCOUNTER — Encounter (HOSPITAL_COMMUNITY): Payer: Self-pay | Admitting: Urology

## 2011-09-07 LAB — GLUCOSE, CAPILLARY: Glucose-Capillary: 182 mg/dL — ABNORMAL HIGH (ref 70–99)

## 2012-01-20 ENCOUNTER — Other Ambulatory Visit (HOSPITAL_COMMUNITY): Payer: Self-pay | Admitting: Internal Medicine

## 2012-01-20 DIAGNOSIS — R1011 Right upper quadrant pain: Secondary | ICD-10-CM

## 2012-01-20 DIAGNOSIS — R110 Nausea: Secondary | ICD-10-CM

## 2012-01-21 ENCOUNTER — Ambulatory Visit (HOSPITAL_COMMUNITY)
Admission: RE | Admit: 2012-01-21 | Discharge: 2012-01-21 | Disposition: A | Discharge status: Home / Self Care | Payer: Medicare Other | Source: Ambulatory Visit | Attending: Internal Medicine | Admitting: Internal Medicine

## 2012-01-21 DIAGNOSIS — I509 Heart failure, unspecified: Secondary | ICD-10-CM | POA: Insufficient documentation

## 2012-01-21 DIAGNOSIS — I10 Essential (primary) hypertension: Secondary | ICD-10-CM | POA: Insufficient documentation

## 2012-01-21 DIAGNOSIS — R110 Nausea: Secondary | ICD-10-CM | POA: Insufficient documentation

## 2012-01-21 DIAGNOSIS — R1011 Right upper quadrant pain: Secondary | ICD-10-CM

## 2012-10-10 IMAGING — CR DG CHEST 2V
1 series · 1 of 1 positions shown · non-contrast
Comparison: 02/02/2011

CLINICAL DATA: Shortness of breath, weakness, fatigue, history CHF,
pneumonia, diabetes

CHEST - 2 VIEW

[w chest lat]
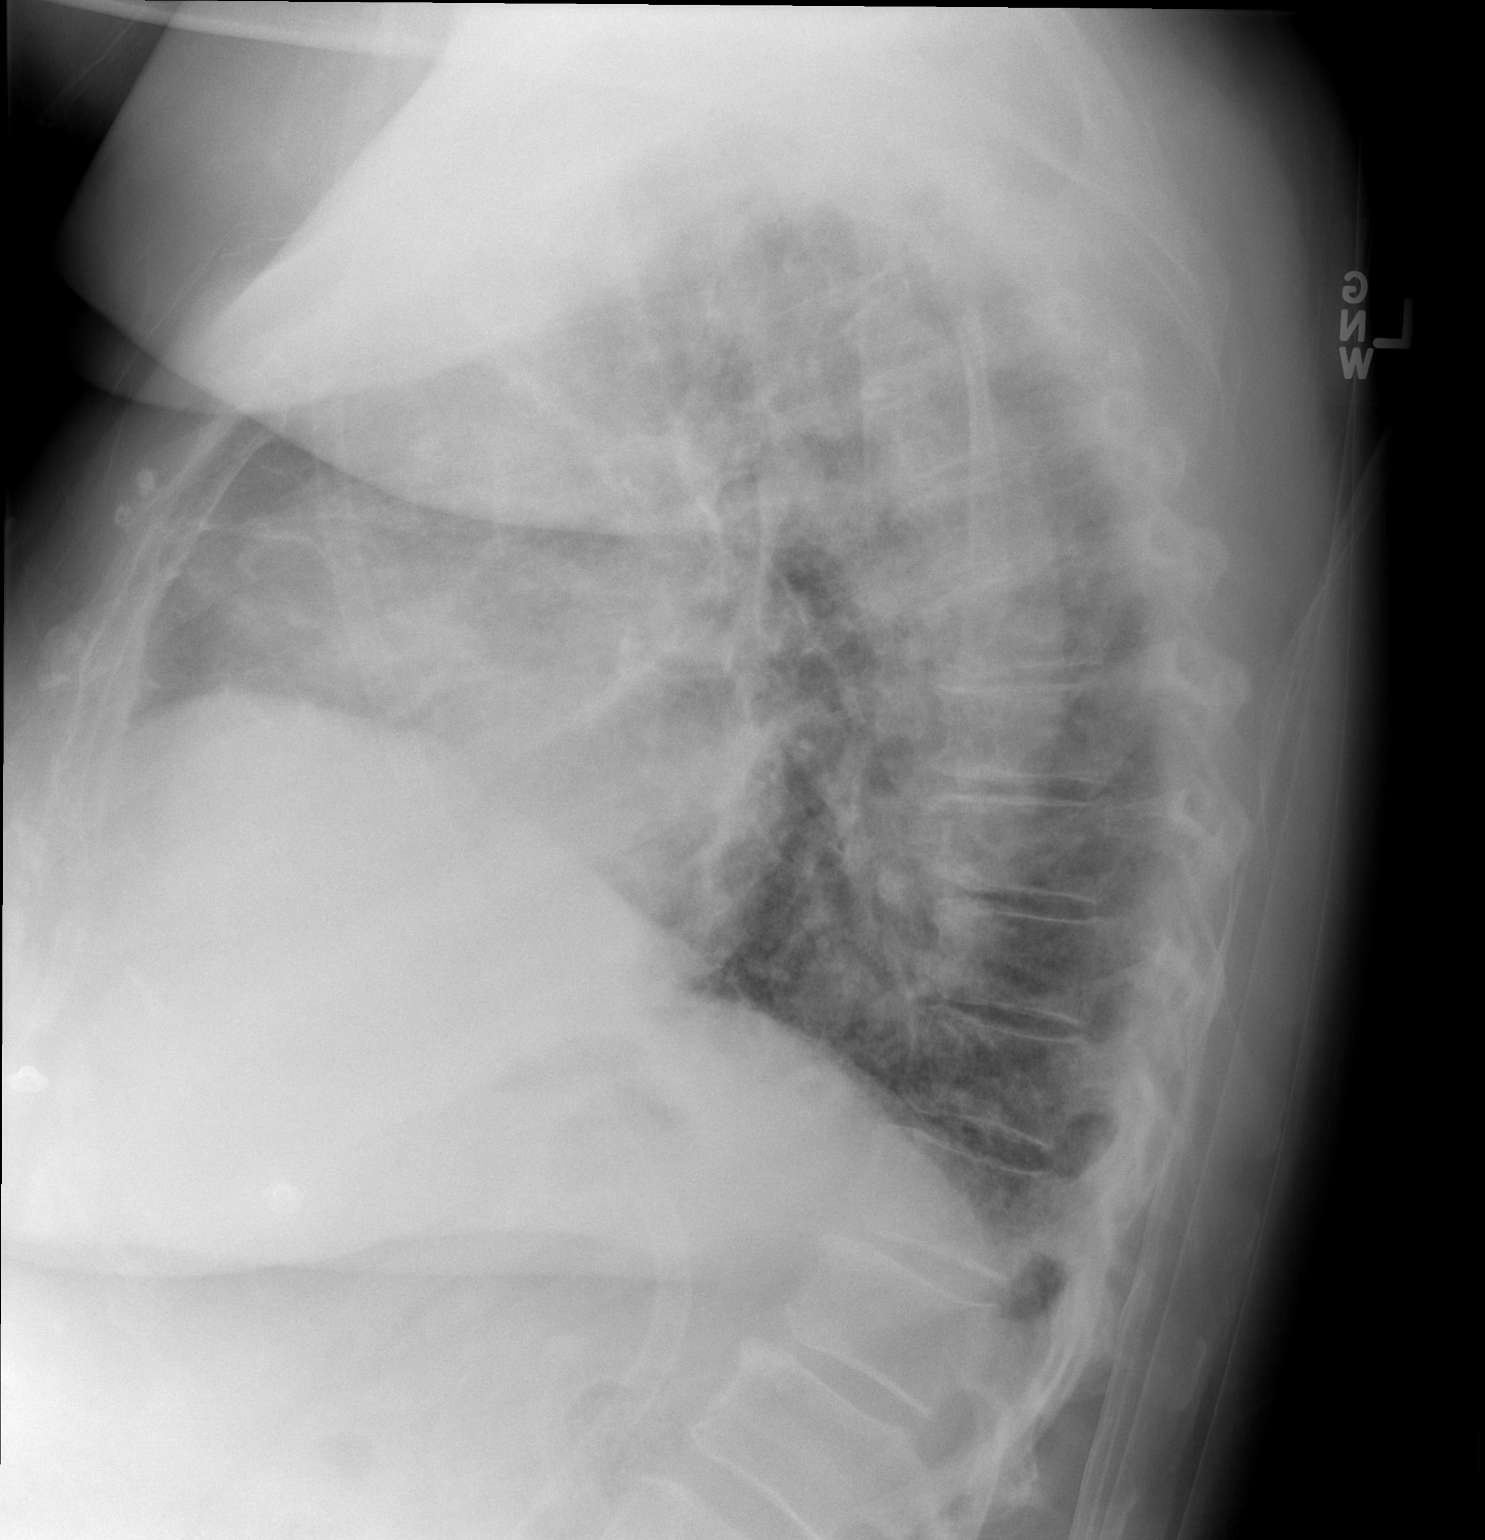

[1 of 1 positions shown; findings below may reference images not displayed]

FINDINGS: Enlargement of cardiac silhouette.
Tortuous aorta.
Pulmonary vascularity normal.
No definite infiltrate or effusion.
Parenchymal opacities seen on previous exam improved.
Bones appear demineralized.
IMPRESSION: Enlargement of cardiac silhouette.
Improved aeration in lungs since prior exam without acute
infiltrate.

## 2013-01-30 ENCOUNTER — Telehealth (HOSPITAL_COMMUNITY): Payer: Self-pay | Admitting: Emergency Medicine

## 2013-01-30 ENCOUNTER — Encounter (HOSPITAL_COMMUNITY): Payer: Self-pay | Admitting: *Deleted

## 2013-01-30 ENCOUNTER — Inpatient Hospital Stay (HOSPITAL_COMMUNITY)
Admission: EM | Admit: 2013-01-30 | Discharge: 2013-02-05 | DRG: 193 | Disposition: A | Discharge status: Skilled Nursing Facility | Payer: Medicare Other | Attending: Internal Medicine | Admitting: Internal Medicine

## 2013-01-30 ENCOUNTER — Emergency Department (HOSPITAL_COMMUNITY): Payer: Medicare Other

## 2013-01-30 DIAGNOSIS — M25571 Pain in right ankle and joints of right foot: Secondary | ICD-10-CM

## 2013-01-30 DIAGNOSIS — I509 Heart failure, unspecified: Secondary | ICD-10-CM | POA: Diagnosis present

## 2013-01-30 DIAGNOSIS — Z7901 Long term (current) use of anticoagulants: Secondary | ICD-10-CM

## 2013-01-30 DIAGNOSIS — E1142 Type 2 diabetes mellitus with diabetic polyneuropathy: Secondary | ICD-10-CM | POA: Diagnosis present

## 2013-01-30 DIAGNOSIS — G9340 Encephalopathy, unspecified: Secondary | ICD-10-CM | POA: Diagnosis present

## 2013-01-30 DIAGNOSIS — Z794 Long term (current) use of insulin: Secondary | ICD-10-CM

## 2013-01-30 DIAGNOSIS — Z86711 Personal history of pulmonary embolism: Secondary | ICD-10-CM

## 2013-01-30 DIAGNOSIS — N390 Urinary tract infection, site not specified: Secondary | ICD-10-CM

## 2013-01-30 DIAGNOSIS — N189 Chronic kidney disease, unspecified: Secondary | ICD-10-CM

## 2013-01-30 DIAGNOSIS — M25579 Pain in unspecified ankle and joints of unspecified foot: Secondary | ICD-10-CM | POA: Diagnosis not present

## 2013-01-30 DIAGNOSIS — Z79899 Other long term (current) drug therapy: Secondary | ICD-10-CM

## 2013-01-30 DIAGNOSIS — J189 Pneumonia, unspecified organism: Secondary | ICD-10-CM

## 2013-01-30 DIAGNOSIS — D72829 Elevated white blood cell count, unspecified: Secondary | ICD-10-CM | POA: Diagnosis present

## 2013-01-30 DIAGNOSIS — R4182 Altered mental status, unspecified: Secondary | ICD-10-CM

## 2013-01-30 DIAGNOSIS — I129 Hypertensive chronic kidney disease with stage 1 through stage 4 chronic kidney disease, or unspecified chronic kidney disease: Secondary | ICD-10-CM | POA: Diagnosis present

## 2013-01-30 DIAGNOSIS — I5030 Unspecified diastolic (congestive) heart failure: Secondary | ICD-10-CM | POA: Diagnosis present

## 2013-01-30 DIAGNOSIS — E1129 Type 2 diabetes mellitus with other diabetic kidney complication: Secondary | ICD-10-CM | POA: Diagnosis present

## 2013-01-30 DIAGNOSIS — E1149 Type 2 diabetes mellitus with other diabetic neurological complication: Secondary | ICD-10-CM | POA: Diagnosis present

## 2013-01-30 DIAGNOSIS — N183 Chronic kidney disease, stage 3 unspecified: Secondary | ICD-10-CM | POA: Diagnosis present

## 2013-01-30 DIAGNOSIS — Z6841 Body Mass Index (BMI) 40.0 and over, adult: Secondary | ICD-10-CM

## 2013-01-30 DIAGNOSIS — Z66 Do not resuscitate: Secondary | ICD-10-CM | POA: Diagnosis present

## 2013-01-30 LAB — CBC WITH DIFFERENTIAL/PLATELET
Basophils Absolute: 0 10*3/uL (ref 0.0–0.1)
Eosinophils Absolute: 0 10*3/uL (ref 0.0–0.7)
Eosinophils Relative: 0 % (ref 0–5)
HCT: 36.9 % (ref 36.0–46.0)
Lymphocytes Relative: 7 % — ABNORMAL LOW (ref 12–46)
MCH: 25.8 pg — ABNORMAL LOW (ref 26.0–34.0)
MCHC: 33.3 g/dL (ref 30.0–36.0)
MCV: 77.4 fL — ABNORMAL LOW (ref 78.0–100.0)
Monocytes Absolute: 0.9 10*3/uL (ref 0.1–1.0)
Platelets: 292 10*3/uL (ref 150–400)
RDW: 15.4 % (ref 11.5–15.5)
WBC: 23.4 10*3/uL — ABNORMAL HIGH (ref 4.0–10.5)

## 2013-01-30 LAB — URINE MICROSCOPIC-ADD ON

## 2013-01-30 LAB — URINALYSIS, ROUTINE W REFLEX MICROSCOPIC
Bilirubin Urine: NEGATIVE
Ketones, ur: NEGATIVE mg/dL
Nitrite: POSITIVE — AB
Protein, ur: 30 mg/dL — AB
pH: 5.5 (ref 5.0–8.0)

## 2013-01-30 LAB — BASIC METABOLIC PANEL
BUN: 30 mg/dL — ABNORMAL HIGH (ref 6–23)
CO2: 25 mEq/L (ref 19–32)
Calcium: 9.2 mg/dL (ref 8.4–10.5)
Chloride: 103 mEq/L (ref 96–112)
Creatinine, Ser: 1.47 mg/dL — ABNORMAL HIGH (ref 0.50–1.10)
Glucose, Bld: 211 mg/dL — ABNORMAL HIGH (ref 70–99)

## 2013-01-30 LAB — HEMOGLOBIN A1C: Hgb A1c MFr Bld: 8.6 % — ABNORMAL HIGH (ref <5.7)

## 2013-01-30 LAB — GLUCOSE, CAPILLARY
Glucose-Capillary: 199 mg/dL — ABNORMAL HIGH (ref 70–99)
Glucose-Capillary: 210 mg/dL — ABNORMAL HIGH (ref 70–99)

## 2013-01-30 LAB — PRO B NATRIURETIC PEPTIDE: Pro B Natriuretic peptide (BNP): 1504 pg/mL — ABNORMAL HIGH (ref 0–450)

## 2013-01-30 MED ORDER — PANTOPRAZOLE SODIUM 40 MG PO TBEC
40.0000 mg | DELAYED_RELEASE_TABLET | Freq: Every day | ORAL | Status: DC
  Administered 2013-01-30 – 2013-02-05 (×7): 40 mg via ORAL
  Filled 2013-01-30 (×7): qty 1

## 2013-01-30 MED ORDER — ACETAMINOPHEN 325 MG PO TABS
650.0000 mg | ORAL_TABLET | Freq: Four times a day (QID) | ORAL | Status: DC | PRN
  Administered 2013-01-30 – 2013-02-03 (×3): 650 mg via ORAL
  Filled 2013-01-30 (×3): qty 2

## 2013-01-30 MED ORDER — FUROSEMIDE 40 MG PO TABS
40.0000 mg | ORAL_TABLET | Freq: Every day | ORAL | Status: DC
  Administered 2013-01-30 – 2013-02-05 (×7): 40 mg via ORAL
  Filled 2013-01-30 (×8): qty 1

## 2013-01-30 MED ORDER — POTASSIUM CHLORIDE 20 MEQ PO PACK
20.0000 meq | PACK | Freq: Every day | ORAL | Status: DC
  Filled 2013-01-30: qty 1

## 2013-01-30 MED ORDER — CEFTRIAXONE SODIUM 1 G IJ SOLR
1.0000 g | INTRAMUSCULAR | Status: DC
  Administered 2013-01-31 – 2013-02-02 (×3): 1 g via INTRAVENOUS
  Filled 2013-01-30 (×3): qty 10

## 2013-01-30 MED ORDER — POTASSIUM CHLORIDE CRYS ER 20 MEQ PO TBCR
20.0000 meq | EXTENDED_RELEASE_TABLET | Freq: Every day | ORAL | Status: DC
  Administered 2013-01-30 – 2013-02-05 (×7): 20 meq via ORAL
  Filled 2013-01-30 (×7): qty 1

## 2013-01-30 MED ORDER — DEXTROSE 5 % IV SOLN
500.0000 mg | Freq: Once | INTRAVENOUS | Status: AC
  Administered 2013-01-30: 500 mg via INTRAVENOUS
  Filled 2013-01-30: qty 500

## 2013-01-30 MED ORDER — LEVOTHYROXINE SODIUM 50 MCG PO TABS
50.0000 ug | ORAL_TABLET | Freq: Every day | ORAL | Status: DC
Start: 2013-01-30 — End: 2013-02-05
  Administered 2013-01-30 – 2013-02-05 (×7): 50 ug via ORAL
  Filled 2013-01-30 (×8): qty 1

## 2013-01-30 MED ORDER — DEXTROSE 5 % IV SOLN
250.0000 mg | INTRAVENOUS | Status: DC
  Administered 2013-01-31 – 2013-02-01 (×2): 250 mg via INTRAVENOUS
  Filled 2013-01-30 (×3): qty 250

## 2013-01-30 MED ORDER — WARFARIN - PHARMACIST DOSING INPATIENT: Freq: Every day | Status: DC

## 2013-01-30 MED ORDER — DONEPEZIL HCL 5 MG PO TABS
5.0000 mg | ORAL_TABLET | Freq: Every day | ORAL | Status: DC
  Administered 2013-01-30 – 2013-02-05 (×7): 5 mg via ORAL
  Filled 2013-01-30 (×7): qty 1

## 2013-01-30 MED ORDER — WARFARIN SODIUM 10 MG PO TABS
10.0000 mg | ORAL_TABLET | Freq: Once | ORAL | Status: AC
  Administered 2013-01-30: 10 mg via ORAL
  Filled 2013-01-30: qty 1

## 2013-01-30 MED ORDER — ACETAMINOPHEN 500 MG PO TABS
500.0000 mg | ORAL_TABLET | Freq: Four times a day (QID) | ORAL | Status: DC | PRN
  Administered 2013-02-01 – 2013-02-03 (×2): 500 mg via ORAL
  Filled 2013-01-30 (×2): qty 1

## 2013-01-30 MED ORDER — ASPIRIN 325 MG PO TABS
325.0000 mg | ORAL_TABLET | Freq: Every day | ORAL | Status: DC
  Administered 2013-01-30 – 2013-02-05 (×7): 325 mg via ORAL
  Filled 2013-01-30 (×8): qty 1

## 2013-01-30 MED ORDER — INSULIN GLARGINE 100 UNIT/ML ~~LOC~~ SOLN
30.0000 [IU] | Freq: Two times a day (BID) | SUBCUTANEOUS | Status: DC
  Administered 2013-01-30 – 2013-02-05 (×13): 30 [IU] via SUBCUTANEOUS
  Filled 2013-01-30 (×16): qty 0.3

## 2013-01-30 MED ORDER — DEXTROSE 5 % IV SOLN
1.0000 g | Freq: Once | INTRAVENOUS | Status: AC
  Administered 2013-01-30: 1 g via INTRAVENOUS
  Filled 2013-01-30: qty 10

## 2013-01-30 MED ORDER — INSULIN ASPART 100 UNIT/ML ~~LOC~~ SOLN
0.0000 [IU] | SUBCUTANEOUS | Status: DC
  Administered 2013-01-30 (×2): 3 [IU] via SUBCUTANEOUS
  Administered 2013-01-30: 5 [IU] via SUBCUTANEOUS
  Administered 2013-01-31 (×2): 3 [IU] via SUBCUTANEOUS
  Administered 2013-01-31: 11 [IU] via SUBCUTANEOUS
  Administered 2013-01-31: 5 [IU] via SUBCUTANEOUS
  Administered 2013-01-31 – 2013-02-01 (×8): 3 [IU] via SUBCUTANEOUS
  Administered 2013-02-02: 8 [IU] via SUBCUTANEOUS
  Administered 2013-02-02: 2 [IU] via SUBCUTANEOUS
  Administered 2013-02-02: 3 [IU] via SUBCUTANEOUS
  Administered 2013-02-02: 2 [IU] via SUBCUTANEOUS
  Administered 2013-02-02: 5 [IU] via SUBCUTANEOUS
  Administered 2013-02-02 – 2013-02-03 (×5): 3 [IU] via SUBCUTANEOUS
  Administered 2013-02-03: 5 [IU] via SUBCUTANEOUS
  Administered 2013-02-03 – 2013-02-04 (×2): 3 [IU] via SUBCUTANEOUS
  Administered 2013-02-04: 1 [IU] via SUBCUTANEOUS
  Administered 2013-02-04: 2 [IU] via SUBCUTANEOUS
  Administered 2013-02-04 – 2013-02-05 (×4): 3 [IU] via SUBCUTANEOUS
  Administered 2013-02-05: 5 [IU] via SUBCUTANEOUS

## 2013-01-30 NOTE — Progress Notes (Signed)
Patient ID: Sara Dennis  female  WGN:[id number]    DOB: 29-Jan-1922    DOA: 01/30/2013  PCP: Sara Kos, MD  Patient admitted by Dr. Alvester Dennis this morning, patient examined, H&P reviewed, they will assessment and plan  Assessment/Plan: Principal Problem:   Acute encephalopathy: Improving likely secondary to UTI, pneumonia   Active Problems:   UTI (urinary tract infection): -Follow urine cultures, continue Rocephin     CAP (community acquired pneumonia) - Continue Zithromax, Rocephin, follow cultures    Leukocytosis, unspecified: Likely secondary to UTI and pneumonia, blood cultures pending, follow cultures, continue Rocephin and the Zithromax    CHF (congestive heart failure) - Patient has a history of CHF, EF unknown, stop IV fluids, saline lock - Obtain 2-D echocardiogram BNP elevated at 1504, on Lasix    Chronic kidney disease stage III - Creatinine function 1.4, baseline 1.0-1.5, monitor closely   History of PE: Continue warfarin  History of diabetes mellitus: Continue Lantus and sliding scale insulin  DVT Prophylaxis: Coumadin   Code Status:  Disposition:PTOT evaluation pending, lives at home with family, ambulates with a cane    Subjective: Feeling a whole lot better today, denies any chest pain or shortness of breath fevers or chills, abdomen sore but no tenderness   Objective: Weight change:   Intake/Output Summary (Last 24 hours) at 01/30/13 1219 Last data filed at 01/30/13 0900  Gross per 24 hour  Intake    240 ml  Output      0 ml  Net    240 ml   Blood pressure 110/44, pulse 79, temperature 98.4 F (36.9 C), temperature source Oral, resp. rate 18, height 5\' 5"  (1.651 m), weight 110.6 kg (243 lb 13.3 oz), SpO2 98.00%.  Physical Exam: General: Alert and awake, oriented x2, not in any acute distress. HEENT: anicteric sclera, pupils reactive to light and accommodation, EOMI CVS: S1-S2 clear, no murmur rubs or gallops Chest: clear to auscultation  bilaterally, no wheezing, rales or rhonchi Abdomen:  obese, soft nontender, nondistended, normal bowel sounds, no organomegaly Extremities: no cyanosis, clubbing or edema noted bilaterally Neuro: Cranial nerves II-XII intact, no focal neurological deficits  Lab Results: Basic Metabolic Panel:  Recent Labs Lab 01/30/13 0239  NA 139  K 5.0  CL 103  CO2 25  GLUCOSE 211*  BUN 30*  CREATININE 1.47*  CALCIUM 9.2   CBC:  Recent Labs Lab 01/30/13 0239  WBC 23.4*  NEUTROABS 20.8*  HGB 12.3  HCT 36.9  MCV 77.4*  PLT 292   Cardiac Enzymes: No results found for this basename: CKTOTAL, CKMB, CKMBINDEX, TROPONINI,  in the last 168 hours BNP: No components found with this basename: POCBNP,  CBG:  Recent Labs Lab 01/30/13 0305 01/30/13 0328 01/30/13 0744 01/30/13 1144  GLUCAP 206* 202* 210* 108*     Micro Results: No results found for this or any previous visit (from the past 240 hour(s)).  Studies/Results: Dg Chest Port 1 View  (if Code Sepsis Called)  01/30/2013  *RADIOLOGY REPORT*  Clinical Data: 79 year old female with fever shortness of breath urinary tract infection.  PORTABLE CHEST - 1 VIEW  Comparison: 09/06/2011 and earlier.  Findings: Portable semi upright AP view 0230 hours.  Continued low lung volumes.  Increased interstitial markings.  Confluent retrocardiac opacity.  Stable cardiac size and mediastinal contours.  No pneumothorax or definite pleural effusion.  Advanced degenerative osseous changes at the left shoulder.  IMPRESSION: Increased interstitial opacity and confluent left lung base opacity. Top differential considerations  include interstitial edema with atelectasis and viral / atypical respiratory infection.   Original Report Authenticated By: Erskine Speed, M.D.     Medications: Scheduled Meds: . aspirin  325 mg Oral Daily  . [START ON 01/31/2013] azithromycin  250 mg Intravenous Q24H  . [START ON 01/31/2013] cefTRIAXone (ROCEPHIN)  IV  1 g Intravenous Q24H   . donepezil  5 mg Oral Daily  . furosemide  40 mg Oral Daily  . insulin aspart  0-15 Units Subcutaneous Q4H  . insulin glargine  30 Units Subcutaneous BID  . levothyroxine  50 mcg Oral QAC breakfast  . pantoprazole  40 mg Oral Daily  . potassium chloride SA  20 mEq Oral Daily  . warfarin  10 mg Oral ONCE-1800  . Warfarin - Pharmacist Dosing Inpatient   Does not apply q1800      LOS: 0 days   Sara Dennis M.D. [facility] 01/30/2013, 12:19 PM Pager: [phone]  If 7PM-7AM, please contact night-coverage [email or web address] Password [email or web address]

## 2013-01-30 NOTE — ED Provider Notes (Signed)
History     CSN: [id number]  Arrival date & time 01/30/13  0205   First MD Initiated Contact with Patient 01/30/13 0210      Chief Complaint  Patient presents with  . Urinary Tract Infection  . Fever  . Hyperglycemia  . Emesis  . Altered Mental Status    (Consider location/radiation/quality/duration/timing/severity/associated sxs/prior treatment) HPI History provided by pt and his son.  Pt has had a fever today.  Associated w/ N/V and AMS, described as "talking out of her head" and "spelling all of her words".  Had similar sx associated w/ UTI in the past.  Pt denies headache, cough, CP, SOB, abdominal pain, diarrhea, urinary or vaginal sx.  Denies recent head trauma.  Is anti-coagulated w/ coumadin.   Past Medical History  Diagnosis Date  . Diabetes mellitus   . Shortness of breath     with activity  . CHF (congestive heart failure) may 2012  . Pulmonary embolus 03-23-11  . Arthritis     left leg  . UTI (lower urinary tract infection)     Past Surgical History  Procedure Laterality Date  . Kisney stone removal  1978    removed thru bladder  . Cystoscopy w/ ureteral stent placement  09/06/2011    Procedure: CYSTOSCOPY WITH RETROGRADE PYELOGRAM/URETERAL STENT PLACEMENT;  Surgeon: Valetta Fuller, MD;  Location: [facility];  Service: Urology;  Laterality: Right;  cysto double j stent placement  . Ureteroscopy  09/06/2011    Procedure: URETEROSCOPY;  Surgeon: Valetta Fuller, MD;  Location: [facility];  Service: Urology;  Laterality: Right;    History reviewed. No pertinent family history.  History  Substance Use Topics  . Smoking status: Never Smoker   . Smokeless tobacco: Never Used  . Alcohol Use: No    OB History   Grav Para Term Preterm Abortions TAB SAB Ect Mult Living                  Review of Systems  All other systems reviewed and are negative.    Allergies  Iohexol; Ivp dye; and Bactrim  Home Medications   Current Outpatient Rx  Name  Route  Sig   Dispense  Refill  . acetaminophen (TYLENOL) 500 MG tablet   Oral   Take 500 mg by mouth every 6 (six) hours as needed. PAIN           . aspirin 325 MG tablet   Oral   Take 325 mg by mouth every morning.           . beta carotene w/minerals (OCUVITE) tablet   Oral   Take 1 tablet by mouth daily.           Marland Kitchen donepezil (ARICEPT) 5 MG tablet   Oral   Take 5 mg by mouth every morning.          . furosemide (LASIX) 40 MG tablet   Oral   Take 40 mg by mouth every morning.          Marland Kitchen glipiZIDE (GLUCOTROL) 10 MG tablet   Oral   Take 10 mg by mouth 2 (two) times daily before a meal.         . insulin glargine (LANTUS) 100 UNIT/ML injection   Subcutaneous   Inject 35 Units into the skin 2 (two) times daily.          Marland Kitchen levothyroxine (SYNTHROID, LEVOTHROID) 50 MCG tablet   Oral   Take 50 mcg  by mouth daily before breakfast.         . meloxicam (MOBIC) 15 MG tablet   Oral   Take 15 mg by mouth daily.         . metFORMIN (GLUCOPHAGE) 500 MG tablet   Oral   Take 500 mg by mouth every evening.           . potassium chloride (KLOR-CON) 20 MEQ packet   Oral   Take 20 mEq by mouth daily.          Marland Kitchen trimethoprim (TRIMPEX) 100 MG tablet   Oral   Take 100 mg by mouth every morning.          . warfarin (COUMADIN) 5 MG tablet   Oral   Take 7.5-10 mg by mouth every evening. 10mg  on Monday, Wednesday, AND Friday.  7.5mg  ON Sunday, Tuesday, Thursday, Saturday.           BP 121/59  Pulse 86  Temp(Src) 101.2 F (38.4 C) (Rectal)  Resp 20  SpO2 96%  Physical Exam  Nursing note and vitals reviewed. Constitutional: She is oriented to person, place, and time. She appears well-developed and well-nourished. No distress.  febrile  HENT:  Head: Normocephalic and atraumatic.  Eyes:  Normal appearance  Neck: Normal range of motion.  Cardiovascular: Normal rate and regular rhythm.   Pulmonary/Chest: Effort normal and breath sounds normal. No respiratory  distress.  Abdominal: Soft. Bowel sounds are normal. She exhibits no distension and no mass. There is no tenderness. There is no rebound and no guarding.  Genitourinary:  No CVA tenderness  Musculoskeletal: Normal range of motion.  Neurological: She is alert and oriented to person, place, and time.  CN 3-12 intact.  No sensory deficits.  5/5 and equal upper and lower extremity strength.    Skin: Skin is warm and dry. No rash noted.  Psychiatric: She has a normal mood and affect. Her behavior is normal.    ED Course  Procedures (including critical care time)  Labs Reviewed  BASIC METABOLIC PANEL - Abnormal; Notable for the following:    Glucose, Bld 211 (*)    BUN 30 (*)    Creatinine, Ser 1.47 (*)    GFR calc non Af Amer 31 (*)    GFR calc Af Amer 36 (*)    All other components within normal limits  CBC WITH DIFFERENTIAL - Abnormal; Notable for the following:    WBC 23.4 (*)    MCV 77.4 (*)    MCH 25.8 (*)    Neutrophils Relative 89 (*)    Neutro Abs 20.8 (*)    Lymphocytes Relative 7 (*)    All other components within normal limits  URINALYSIS, ROUTINE W REFLEX MICROSCOPIC - Abnormal; Notable for the following:    APPearance TURBID (*)    Hgb urine dipstick MODERATE (*)    Protein, ur 30 (*)    Nitrite POSITIVE (*)    Leukocytes, UA LARGE (*)    All other components within normal limits  PROTIME-INR - Abnormal; Notable for the following:    Prothrombin Time 19.1 (*)    INR 1.66 (*)    All other components within normal limits  GLUCOSE, CAPILLARY - Abnormal; Notable for the following:    Glucose-Capillary 206 (*)    All other components within normal limits  URINE MICROSCOPIC-ADD ON - Abnormal; Notable for the following:    Bacteria, UA MANY (*)    All other components within normal limits  CULTURE, BLOOD (ROUTINE X 2)  CULTURE, BLOOD (ROUTINE X 2)  URINE CULTURE   Dg Chest Port 1 View  (if Code Sepsis Called)  01/30/2013  *RADIOLOGY REPORT*  Clinical Data:  79 year old female with fever shortness of breath urinary tract infection.  PORTABLE CHEST - 1 VIEW  Comparison: 09/06/2011 and earlier.  Findings: Portable semi upright AP view 0230 hours.  Continued low lung volumes.  Increased interstitial markings.  Confluent retrocardiac opacity.  Stable cardiac size and mediastinal contours.  No pneumothorax or definite pleural effusion.  Advanced degenerative osseous changes at the left shoulder.  IMPRESSION: Increased interstitial opacity and confluent left lung base opacity. Top differential considerations include interstitial edema with atelectasis and viral / atypical respiratory infection.   Original Report Authenticated By: Erskine Speed, M.D.      1. Community acquired pneumonia   2. Altered mental status       MDM  79yo diabetic F presents w/ AMS, fever and vomiting.  On exam, febrile, VS otherwise w/in nml range, non-toxic appearing, oriented x 4, nml breath sounds, abd benign, no focal neuro deficits.  Labs sig for UTI, acute kidney injury and leukocytosis.  CXR shows interstitial opacity concerning for atypical pneumonia.  IV rocephin/zithromax initiated.  [facility] consulted for admission.  3:43 AM         Otilio Miu, PA-C 01/30/13 [phone]

## 2013-01-30 NOTE — H&P (Signed)
Hospitalist Admission History and Physical  Patient name: Sara Dennis Medical record number: [id number] Date of birth: 1922/06/21 Age: 79 y.o. Gender: female  Primary Care Provider: Theodoro Kos, MD  Chief Complaint: fever, AMS, UTI  History of Present Illness:This is a 79 y.o. year old female with significant past medical history of diabetes, UTI, kidney stone s/p removal, prior ureteral stent plCHF, PE on chronic anticoagulation presenting with fever, AMS ,UTI. Level V caveat as pt is confused and poor historian. Per report, pt was noted to be "talking out of her head" by her son, also spelling words. Per report, pt has had similar sxs with prior UTIs in the past.  In the ER, pt with WBC 23 as well as changes on UA indicative of UTI. Pt also with questionable PNA on CXR. No hypoxia, tachycardia. INR 1.66 today.   Patient Active Problem List   Diagnosis Date Noted  . Ureteral calculus 09/06/2011   Past Medical History: Past Medical History  Diagnosis Date  . Diabetes mellitus   . Shortness of breath     with activity  . CHF (congestive heart failure) may 2012  . Pulmonary embolus 03-23-11  . Arthritis     left leg  . UTI (lower urinary tract infection)     Past Surgical History: Past Surgical History  Procedure Laterality Date  . Kisney stone removal  1978    removed thru bladder  . Cystoscopy w/ ureteral stent placement  09/06/2011    Procedure: CYSTOSCOPY WITH RETROGRADE PYELOGRAM/URETERAL STENT PLACEMENT;  Surgeon: Valetta Fuller, MD;  Location: [facility];  Service: Urology;  Laterality: Right;  cysto double j stent placement  . Ureteroscopy  09/06/2011    Procedure: URETEROSCOPY;  Surgeon: Valetta Fuller, MD;  Location: [facility];  Service: Urology;  Laterality: Right;    Social History: History   Social History  . Marital Status: Widowed    Spouse Name: N/A    Number of Children: N/A  . Years of Education: N/A   Social History Main Topics  . Smoking status: Never  Smoker   . Smokeless tobacco: Never Used  . Alcohol Use: No  . Drug Use: No  . Sexually Active: None   Other Topics Concern  . None   Social History Narrative  . None    Family History: History reviewed. No pertinent family history.  Allergies: Allergies  Allergen Reactions  . Iohexol   . Ivp Dye (Iodinated Diagnostic Agents)     itching  . Bactrim Itching and Rash    Current Facility-Administered Medications  Medication Dose Route Frequency Provider Last Rate Last Dose  . acetaminophen (TYLENOL) tablet 650 mg  650 mg Oral Q6H PRN Toy Baker, MD   650 mg at 01/30/13 0333  . azithromycin (ZITHROMAX) 500 mg in dextrose 5 % 250 mL IVPB  500 mg Intravenous Once Otilio Miu, PA-C       Review Of Systems: 12 point ROS negative except as noted above in HPI.  Physical Exam: Filed Vitals:   01/30/13 0428  BP: 110/44  Pulse: 79  Temp: 98.4 F (36.9 C)  Resp: 18    General: morbidly obese, sleepin  HEENT: PERRLA and extra ocular movement intact Heart: S1, S2 normal, no murmur, rub or gallop, regular rate and rhythm Lungs: clear to auscultation, no wheezes or rales and unlabored breathing Abdomen: obese abdomen, non tender Extremities: extremities normal, atraumatic, no cyanosis or edema Skin:no rashes, no ecchymoses Neurology: normal without focal  findings  Labs and Imaging: Lab Results  Component Value Date/Time   NA 139 01/30/2013  2:39 AM   K 5.0 01/30/2013  2:39 AM   CL 103 01/30/2013  2:39 AM   CO2 25 01/30/2013  2:39 AM   BUN 30* 01/30/2013  2:39 AM   CREATININE 1.47* 01/30/2013  2:39 AM   GLUCOSE 211* 01/30/2013  2:39 AM   Lab Results  Component Value Date   WBC 23.4* 01/30/2013   HGB 12.3 01/30/2013   HCT 36.9 01/30/2013   MCV 77.4* 01/30/2013   PLT 292 01/30/2013   Urinalysis    Component Value Date/Time   COLORURINE YELLOW 01/30/2013 0241   APPEARANCEUR TURBID* 01/30/2013 0241   LABSPEC 1.017 01/30/2013 0241   PHURINE 5.5 01/30/2013 0241   GLUCOSEU NEGATIVE  01/30/2013 0241   HGBUR MODERATE* 01/30/2013 0241   BILIRUBINUR NEGATIVE 01/30/2013 0241   KETONESUR NEGATIVE 01/30/2013 0241   PROTEINUR 30* 01/30/2013 0241   UROBILINOGEN 0.2 01/30/2013 0241   NITRITE POSITIVE* 01/30/2013 0241   LEUKOCYTESUR LARGE* 01/30/2013 0241       Dg Chest Port 1 View  (if Code Sepsis Called)  01/30/2013  *RADIOLOGY REPORT*  Clinical Data: 79 year old female with fever shortness of breath urinary tract infection.  PORTABLE CHEST - 1 VIEW  Comparison: 09/06/2011 and earlier.  Findings: Portable semi upright AP view 0230 hours.  Continued low lung volumes.  Increased interstitial markings.  Confluent retrocardiac opacity.  Stable cardiac size and mediastinal contours.  No pneumothorax or definite pleural effusion.  Advanced degenerative osseous changes at the left shoulder.  IMPRESSION: Increased interstitial opacity and confluent left lung base opacity. Top differential considerations include interstitial edema with atelectasis and viral / atypical respiratory infection.   Original Report Authenticated By: Erskine Speed, M.D.      Assessment and Plan: Sara Dennis is a 79 y.o. year old female presenting with fever, AMS, UTI, PNA  AMS: likely secondary to UTI and CAP. Will treat with appropriate abx and reassess. Euvolemic on exam. Reassess pending treatment.   UTI/PNA: Will place on regimen of rocephin and azithro for CAP (no recent hospitalizations). Rocephin should cover urinary tract. Urine culture.  Urine strep and legionella. Sputum culture. Trend leukocytosis.   CKD: Stage III-IV CKD based on prior and current GFRs. Euvolemic on exam. Will trend. With treatment.   CHF: Unclear if systolic/diastolic/mixed. Euvolemic on exam. Will check pro BNP. No indication for cardiac imagine currently.   Hx/o PE: Coumdin per pharmacy. Resp status stable.   DM: A1C. SSI. Hold oral meds.   FEN/GI: Carb modified diet. PPI.  Prophylaxis: coumadin  Disposition: pending further evaluation   Code Status:full code (will need to reassess with son)       Doree Albee MD  Pager: [phone]

## 2013-01-30 NOTE — Progress Notes (Signed)
ANTICOAGULATION CONSULT NOTE - Initial Consult  Pharmacy Consult for warfarin Indication: pulmonary embolus  Allergies  Allergen Reactions  . Iohexol   . Ivp Dye (Iodinated Diagnostic Agents)     itching  . Bactrim Itching and Rash    Patient Measurements: Height: 5\' 5"  (165.1 cm) Weight: 243 lb 13.3 oz (110.6 kg) IBW/kg (Calculated) : 57 Heparin Dosing Weight:   Vital Signs: Temp: 98.4 F (36.9 C) (05/06 0428) Temp src: Oral (05/06 0428) BP: 110/44 mmHg (05/06 0428) Pulse Rate: 79 (05/06 0428)  Labs:  Recent Labs  01/30/13 0239  HGB 12.3  HCT 36.9  PLT 292  LABPROT 19.1*  INR 1.66*  CREATININE 1.47*    Estimated Creatinine Clearance: 32.7 ml/min (by C-G formula based on Cr of 1.47).   Medical History: Past Medical History  Diagnosis Date  . Diabetes mellitus   . Shortness of breath     with activity  . CHF (congestive heart failure) may 2012  . Pulmonary embolus 03-23-11  . Arthritis     left leg  . UTI (lower urinary tract infection)     Medications:  Prescriptions prior to admission  Medication Sig Dispense Refill  . acetaminophen (TYLENOL) 500 MG tablet Take 500 mg by mouth every 6 (six) hours as needed. PAIN        . aspirin 325 MG tablet Take 325 mg by mouth every morning.        . beta carotene w/minerals (OCUVITE) tablet Take 1 tablet by mouth daily.        Marland Kitchen donepezil (ARICEPT) 5 MG tablet Take 5 mg by mouth every morning.       . furosemide (LASIX) 40 MG tablet Take 40 mg by mouth every morning.       Marland Kitchen glipiZIDE (GLUCOTROL) 10 MG tablet Take 10 mg by mouth 2 (two) times daily before a meal.      . insulin glargine (LANTUS) 100 UNIT/ML injection Inject 35 Units into the skin 2 (two) times daily.       Marland Kitchen levothyroxine (SYNTHROID, LEVOTHROID) 50 MCG tablet Take 50 mcg by mouth daily before breakfast.      . meloxicam (MOBIC) 15 MG tablet Take 15 mg by mouth daily.      . metFORMIN (GLUCOPHAGE) 500 MG tablet Take 500 mg by mouth every evening.         . potassium chloride (KLOR-CON) 20 MEQ packet Take 20 mEq by mouth daily.       Marland Kitchen trimethoprim (TRIMPEX) 100 MG tablet Take 100 mg by mouth every morning.       . warfarin (COUMADIN) 5 MG tablet Take 7.5-10 mg by mouth every evening. 10mg  on Monday, Wednesday, AND Friday.  7.5mg  ON Sunday, Tuesday, Thursday, Saturday.        Assessment: Patient with chronic warfarin for PE treatment.  INR <2.  Goal of Therapy:  INR 2-3   Plan:  Warfarin 10mg  po x1  Daily INR  Aleene Davidson Crowford 01/30/2013,6:42 AM

## 2013-01-30 NOTE — ED Notes (Signed)
Blood drawn from NSL, I&O UA done, placed on BP (pt opted for BP rather than up to commode), rectal temp done, daughter and son at [facility], pt alert, NAD, calm, tolerating well.

## 2013-01-30 NOTE — ED Notes (Signed)
[id number]<JY> Expected date:<BR> Expected time:<BR> Means of arrival:<BR> Comments:<BR> EMS 69F febrile, CBG 308; family reports confusion

## 2013-01-30 NOTE — ED Notes (Addendum)
Pt here from home, lives with family, here by EMS, family to arrive, here for hyperglycemia, AMS, fever, UTIsx, smells of strong urine odor per EMS, cbg 308, NSL placed PTA, pt A&Ox4, aware of reason for being here, (denies:  Pain, nausea, sob, dizziness or other sx), speech clear, LS CTA. Blind in L eye. Uses cane at home.

## 2013-01-30 NOTE — ED Notes (Addendum)
EDPA at [facility], son arrives to [facility].

## 2013-01-30 NOTE — Care Management (Signed)
CARE MANAGEMENT NOTE 01/30/2013  Patient:  Sara Dennis, Sara Dennis   Account Number:  [id number]  Date Initiated:  01/30/2013  Documentation initiated by:  Ekta Dancer  Subjective/Objective Assessment:   79 yo female admitted with UTI & CAP. PTA pt lived home with adult son.     Action/Plan:   Home when stable vs SNF   Anticipated DC Date:     Anticipated DC Plan:  HOME W HOME HEALTH SERVICES  In-house referral  Clinical Social Worker      DC Planning Services  CM consult      Choice offered to / List presented to:  NA           Status of service:  In process, will continue to follow Medicare Important Message given?   (If response is "NO", the following Medicare IM given date fields will be blank) Date Medicare IM given:   Date Additional Medicare IM given:    Discharge Disposition:    Per UR Regulation:  Reviewed for med. necessity/level of care/duration of stay  If discussed at Long Length of Stay Meetings, dates discussed:    Comments:  01/30/13 1400 Deontay Ladnier,RN,BSN [phone] Pt admitted with AMS & UTI. PTA pt from home with adult son assisting with home care. Pt has HX of Dm & CHF. May require [facility] services upon discharge for disease mamangement.

## 2013-01-31 DIAGNOSIS — I359 Nonrheumatic aortic valve disorder, unspecified: Secondary | ICD-10-CM

## 2013-01-31 LAB — BASIC METABOLIC PANEL
CO2: 27 mEq/L (ref 19–32)
Calcium: 9.3 mg/dL (ref 8.4–10.5)
Chloride: 102 mEq/L (ref 96–112)
Creatinine, Ser: 1.24 mg/dL — ABNORMAL HIGH (ref 0.50–1.10)
Glucose, Bld: 159 mg/dL — ABNORMAL HIGH (ref 70–99)
Sodium: 137 mEq/L (ref 135–145)

## 2013-01-31 LAB — CBC
HCT: 35.3 % — ABNORMAL LOW (ref 36.0–46.0)
MCH: 25.7 pg — ABNORMAL LOW (ref 26.0–34.0)
MCV: 78.3 fL (ref 78.0–100.0)
RBC: 4.51 MIL/uL (ref 3.87–5.11)
WBC: 17.2 10*3/uL — ABNORMAL HIGH (ref 4.0–10.5)

## 2013-01-31 LAB — GLUCOSE, CAPILLARY
Glucose-Capillary: 154 mg/dL — ABNORMAL HIGH (ref 70–99)
Glucose-Capillary: 349 mg/dL — ABNORMAL HIGH (ref 70–99)
Glucose-Capillary: 223 mg/dL — ABNORMAL HIGH (ref 70–99)

## 2013-01-31 LAB — PROTIME-INR: INR: 1.58 — ABNORMAL HIGH (ref 0.00–1.49)

## 2013-01-31 MED ORDER — WARFARIN SODIUM 10 MG PO TABS
10.0000 mg | ORAL_TABLET | Freq: Once | ORAL | Status: AC
  Administered 2013-01-31: 10 mg via ORAL
  Filled 2013-01-31: qty 1

## 2013-01-31 NOTE — Progress Notes (Signed)
Patient ID: Sara Dennis, female   DOB: [birth year], 79 y.o.   MRN: [id number]  [facility] PROGRESS NOTE  Sara Dennis WUJ:[id number] DOB: Aug 22, 1922 DOA: 01/30/2013 PCP: Theodoro Kos, MD  Brief narrative: Pt admitted with AMS and history was not obtained form pt as a result. Pt apparently had 2-3 days duration of progressively worsening shortness of breath, associated with productive cough of yellow sputum, fevers, chills, poor oral intake and urinary urgency and frequency. [facility] asked to admit for UTI and CAP treatment.  Principal Problem:   Acute encephalopathy - this was determined to be secondary to UTI and CAP - continue Zithromax and Rocephin for now as pt is responding well and is at her baseline mental status  - PT evaluation pending  Active Problems:   UTI (urinary tract infection) - final urine cultures pending, Rocephin should cover   CAP (community acquired pneumonia) - supportive care with oxygen as needed, ABX as noted above   Leukocytosis, unspecified - secondary to CAP and UTI, WBC trending down, repeat CBC in AM   CHF (congestive heart failure) - unknown grade and type, 2 D ECHO pending  - avoid ACEI due to renal failure    Chronic kidney disease stage III  - creatinine is trending down, repeat BMP in AM   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 Top differential considerations include interstitial edema with atelectasis and viral / atypical respiratory infection.    Antibiotics:  Rocephin 05/05 -->  Zithromax 05/05 -->   Code Status: Full Family Communication: Pt and daughter at bedside Disposition Plan: Home when medically stable  HPI/Subjective: No events  overnight.   Objective: Filed Vitals:   01/30/13 1435 01/30/13 2131 01/31/13 0409 01/31/13 1307  BP: 141/64 118/71 119/67 127/76  Pulse: 86 95 92 77  Temp: 99 F (37.2 C) 99.1 F (37.3 C) 97.9 F (36.6 C) 98 F (36.7 C)  TempSrc:  Oral Oral   Resp: 16 18 16 18   Height:      Weight:      SpO2: 95% 100% 97% 99%    Intake/Output Summary (Last 24 hours) at 01/31/13 1725 Last data filed at 01/31/13 1300  Gross per 24 hour  Intake    720 ml  Output      0 ml  Net    720 ml    Exam:   General:  Pt is alert, follows commands appropriately, not in acute distress  Cardiovascular: Regular rate and rhythm, S1/S2, no murmurs, no rubs, no gallops  Respiratory: Clear to auscultation bilaterally, no wheezing, no crackles, no rhonchi  Abdomen: Soft, non tender, non distended, bowel sounds present, no guarding  Extremities: No edema, pulses DP and PT palpable bilaterally  Neuro: Grossly nonfocal  Data Reviewed: Basic Metabolic Panel:  Recent Labs Lab 01/30/13 0239 01/31/13 0427  NA 139 137  K 5.0 4.1  CL 103 102  CO2 25 27  GLUCOSE 211* 159*  BUN 30* 20  CREATININE 1.47* 1.24*  CALCIUM 9.2 9.3   CBC:  Recent Labs Lab 01/30/13 0239 01/31/13 0427  WBC 23.4* 17.2*  NEUTROABS 20.8*  --   HGB 12.3 11.6*  HCT 36.9 35.3*  MCV 77.4* 78.3  PLT 292 259  CBG:  Recent Labs Lab 01/31/13 0407 01/31/13 0430 01/31/13 0809 01/31/13 1147 01/31/13 1657  GLUCAP 168* 154* 154* 349* 181*    Recent Results (from the past 240 hour(s))  URINE CULTURE     Status: None   Collection Time    01/30/13  2:41 AM      Result Value Range Status   Specimen Description URINE, CATHETERIZED   Final   Special Requests NONE   Final   Culture  Setup Time 01/30/2013 10:20   Final   Colony Count PENDING   Incomplete   Culture Culture reincubated for better growth   Final   Report Status PENDING   Incomplete  CULTURE, BLOOD (ROUTINE X 2)     Status: None   Collection Time     01/30/13  3:25 AM      Result Value Range Status   Specimen Description BLOOD LEFT WRIST   Final   Special Requests BOTTLES DRAWN AEROBIC AND ANAEROBIC [facility] EACH   Final   Culture  Setup Time 01/30/2013 10:28   Final   Culture     Final   Value:        BLOOD CULTURE RECEIVED NO GROWTH TO DATE CULTURE WILL BE HELD FOR 5 DAYS BEFORE ISSUING A FINAL NEGATIVE REPORT   Report Status PENDING   Incomplete  CULTURE, BLOOD (ROUTINE X 2)     Status: None   Collection Time    01/30/13  3:27 AM      Result Value Range Status   Specimen Description BLOOD RIGHT WRIST   Final   Special Requests BOTTLES DRAWN AEROBIC AND ANAEROBIC 5CC EACH   Final   Culture  Setup Time 01/30/2013 10:28   Final   Culture     Final   Value:        BLOOD CULTURE RECEIVED NO GROWTH TO DATE CULTURE WILL BE HELD FOR 5 DAYS BEFORE ISSUING A FINAL NEGATIVE REPORT   Report Status PENDING   Incomplete     Scheduled Meds: . aspirin  325 mg Oral Daily  . azithromycin  250 mg Intravenous Q24H  . cefTRIAXone (ROCEPHIN)  IV  1 g Intravenous Q24H  . donepezil  5 mg Oral Daily  . furosemide  40 mg Oral Daily  . insulin aspart  0-15 Units Subcutaneous Q4H  . insulin glargine  30 Units Subcutaneous BID  . levothyroxine  50 mcg Oral QAC breakfast  . pantoprazole  40 mg Oral Daily  . potassium chloride SA  20 mEq Oral Daily  . Warfarin - Pharmacist Dosing Inpatient   Does not apply q1800   Continuous Infusions:   Sara Presto, MD  [facility] Pager [phone]  If 7PM-7AM, please contact night-coverage [email or web address] Password [email or web address] 01/31/2013, 5:25 PM   LOS: 1 day

## 2013-01-31 NOTE — Evaluation (Signed)
Physical Therapy Evaluation Patient Details Name: Sara Dennis MRN: [id number] DOB: July 31, 1922 Today's Date: 01/31/2013 Time: 7829-5621 PT Time Calculation (min): 11 min  PT Assessment / Plan / Recommendation Clinical Impression  Pt is an 79 yo female admitted for AMS and UTI.  Pt reports feeling very weak today and unable to ambulate.  Pt would benefit from acute PT services in order to improve independence with transfers and ambulation to prepare for d/c to SNF.      PT Assessment  Patient needs continued PT services    Follow Up Recommendations  SNF;Supervision/Assistance - 24 hour    Does the patient have the potential to tolerate intense rehabilitation      Barriers to Discharge        Equipment Recommendations  Rolling walker with 5" wheels    Recommendations for Other Services     Frequency Min 3X/week    Precautions / Restrictions Precautions Precautions: Fall   Pertinent Vitals/Pain n/a      Mobility  Bed Mobility Bed Mobility: Supine to Sit Supine to Sit: 4: Min assist;HOB elevated Details for Bed Mobility Assistance: assist for trunk upright Transfers Transfers: Sit to Stand;Stand to Sit;Stand Pivot Transfers Sit to Stand: 4: Min assist;From elevated surface;With upper extremity assist;From bed Stand to Sit: 3: Mod assist;Without upper extremity assist;To chair/3-in-1 Stand Pivot Transfers: 4: Min assist Details for Transfer Assistance: verbal cues for safe technique, elevated bed to assist with standing, RW for pivot, pt attempted standing once however felt weak so uncontrolled descent back to sitting on bed Ambulation/Gait Ambulation/Gait Assistance: Not tested (comment) (pt reported too weak)    Exercises     PT Diagnosis: Difficulty walking;Generalized weakness  PT Problem List: Decreased strength;Decreased activity tolerance;Decreased mobility;Decreased safety awareness;Decreased knowledge of use of DME;Decreased knowledge of  precautions;Obesity PT Treatment Interventions: DME instruction;Gait training;Functional mobility training;Therapeutic activities;Therapeutic exercise;Patient/family education;Stair training;Balance training   PT Goals Acute Rehab PT Goals PT Goal Formulation: With patient Time For Goal Achievement: 02/14/13 Potential to Achieve Goals: Good Pt will go Supine/Side to Sit: with modified independence PT Goal: Supine/Side to Sit - Progress: Goal set today Pt will go Sit to Supine/Side: with modified independence PT Goal: Sit to Supine/Side - Progress: Goal set today Pt will go Sit to Stand: with modified independence PT Goal: Sit to Stand - Progress: Goal set today Pt will go Stand to Sit: with modified independence PT Goal: Stand to Sit - Progress: Goal set today Pt will Ambulate: 51 - 150 feet;with modified independence;with least restrictive assistive device PT Goal: Ambulate - Progress: Goal set today Pt will Perform Home Exercise Program: with supervision, verbal cues required/provided PT Goal: Perform Home Exercise Program - Progress: Goal set today  Visit Information  Last PT Received On: 01/31/13 Assistance Needed: +2    Subjective Data  Subjective: What she says goes.  (daughter) Patient Stated Goal: get stronger   Prior Functioning  Home Living Lives With: Daughter Available Help at Discharge: Family Type of Home: House Home Access: Stairs to enter Secretary/administrator of Steps: 3 Entrance Stairs-Rails: None Home Layout: One level Home Adaptive Equipment: Straight cane Prior Function Level of Independence: Independent with assistive device(s) Communication Communication: No difficulties    Cognition  Cognition Arousal/Alertness: Awake/alert Behavior During Therapy: WFL for tasks assessed/performed Overall Cognitive Status: Within Functional Limits for tasks assessed    Extremity/Trunk Assessment Right Lower Extremity Assessment RLE ROM/Strength/Tone:  Deficits RLE ROM/Strength/Tone Deficits: functional weakness observed Left Lower Extremity Assessment LLE ROM/Strength/Tone: Deficits LLE  ROM/Strength/Tone Deficits: functional weakness observed   Balance    End of Session PT - End of Session Equipment Utilized During Treatment: Gait belt Activity Tolerance: Patient limited by fatigue Patient left: in chair;with call bell/phone within reach;with nursing in room;with family/visitor present  GP     Jaeleen Inzunza,KATHrine E 01/31/2013, 11:55 AM Zenovia Jarred, PT, DPT 01/31/2013 Pager: [phone]

## 2013-01-31 NOTE — Progress Notes (Signed)
Clinical Social Work Department BRIEF PSYCHOSOCIAL ASSESSMENT 01/31/2013  Patient:  Sara Dennis, Sara Dennis     Account Number:  [id number]     Admit date:  01/30/2013  Clinical Social Worker:  Jacelyn Grip  Date/Time:  01/31/2013 03:00 PM  Referred by:  Physician  Date Referred:  01/31/2013 Referred for  SNF Placement   Other Referral:   Interview type:  Patient Other interview type:    PSYCHOSOCIAL DATA Living Status:  FAMILY Admitted from facility:   Level of care:   Primary support name:  Annice Pih Mosley/daughter Primary support relationship to patient:  CHILD, ADULT Degree of support available:   pt reports strong support from pt daughter and other children    CURRENT CONCERNS Current Concerns  Post-Acute Placement   Other Concerns:    SOCIAL WORK ASSESSMENT / PLAN CSW met with pt at bedside re: disposition planning.    Pt reports that she lives with pt daughter and has multiple children who provide support. Pt reports that atleast one family member is at home with pt throughout the day and night. Pt discussed that she was able to ambulate prior to admission, but recognized that she was unable to do so with PT today. CSW discussed PT evaluation and recommendation for ST SNF for rehab.  Pt feels that she does not need rehab at this time as pt family cares for pt at home. CSW contacted pt daughter via telephone with pt permission, but no answer and not voice mail box to leave message.    CSW to follow up with pt and pt family re: disposition plan and make sure that pt family also feel that they can manage pt care at home.   Assessment/plan status:  Psychosocial Support/Ongoing Assessment of Needs Other assessment/ plan:   discharge planning   Information/referral to community resources:   RNCM following for possible home needs    PATIENT'S/FAMILY'S RESPONSE TO PLAN OF CARE:  Pt alert and oriented x 4. Pt not agreeable to SNF placement at this time and feels that pt family  would be able to provide pt care at home. CSW unable to reach pt family at this time to discuss, will follow up.  Jacklynn Lewis, MSW, LCSWA  Clinical Social Work [phone]

## 2013-01-31 NOTE — Progress Notes (Signed)
ANTICOAGULATION CONSULT NOTE - Follow Up  Pharmacy Consult for warfarin Indication: hx PE  Allergies  Allergen Reactions  . Iohexol   . Ivp Dye (Iodinated Diagnostic Agents)     itching  . Bactrim Itching and Rash    Patient Measurements: Height: 5\' 5"  (165.1 cm) Weight: 243 lb 13.3 oz (110.6 kg) IBW/kg (Calculated) : 57  Vital Signs: Temp: 97.9 F (36.6 C) (05/07 0409) Temp src: Oral (05/07 0409) BP: 119/67 mmHg (05/07 0409) Pulse Rate: 92 (05/07 0409)  Labs:  Recent Labs  01/30/13 0239 01/31/13 0427  HGB 12.3 11.6*  HCT 36.9 35.3*  PLT 292 259  LABPROT 19.1* 18.4*  INR 1.66* 1.58*  CREATININE 1.47* 1.24*    Estimated Creatinine Clearance: 38.8 ml/min (by C-G formula based on Cr of 1.24).   Medical History: Past Medical History  Diagnosis Date  . Diabetes mellitus   . Shortness of breath     with activity  . CHF (congestive heart failure) may 2012  . Pulmonary embolus 03-23-11  . Arthritis     left leg  . UTI (lower urinary tract infection)     Medications:  Prescriptions prior to admission  Medication Sig Dispense Refill  . acetaminophen (TYLENOL) 500 MG tablet Take 500 mg by mouth every 6 (six) hours as needed. PAIN        . aspirin 325 MG tablet Take 325 mg by mouth every morning.        . beta carotene w/minerals (OCUVITE) tablet Take 1 tablet by mouth daily.        Marland Kitchen donepezil (ARICEPT) 5 MG tablet Take 5 mg by mouth every morning.       . furosemide (LASIX) 40 MG tablet Take 40 mg by mouth every morning.       Marland Kitchen glipiZIDE (GLUCOTROL) 10 MG tablet Take 10 mg by mouth 2 (two) times daily before a meal.      . insulin glargine (LANTUS) 100 UNIT/ML injection Inject 35 Units into the skin 2 (two) times daily.       Marland Kitchen levothyroxine (SYNTHROID, LEVOTHROID) 50 MCG tablet Take 50 mcg by mouth daily before breakfast.      . meloxicam (MOBIC) 15 MG tablet Take 15 mg by mouth daily.      . metFORMIN (GLUCOPHAGE) 500 MG tablet Take 500 mg by mouth every  evening.        . potassium chloride (KLOR-CON) 20 MEQ packet Take 20 mEq by mouth daily.       Marland Kitchen trimethoprim (TRIMPEX) 100 MG tablet Take 100 mg by mouth every morning.       . warfarin (COUMADIN) 5 MG tablet Take 7.5-10 mg by mouth every evening. 10mg  on Monday, Wednesday, AND Friday.  7.5mg  ON Sunday, Tuesday, Thursday, Saturday.        Assessment: 61 yof w/ hx PE admitted 5/6 w/ fever, AMS, and UTI. Pt is on chronic coumadin for hx PE, pharmac to dose while inpt  Dose PTA 10mg  MWF, 7.5mg  other days  INR subtx, down some from 5/6 after 10mg  5/6  No bleeding reported  Pt on Azithromycin/Rocephin - not likely to effect INR  Goal of Therapy:  INR 2-3   Plan:  Warfarin 10mg  po x1  Daily INR  Gwen Her PharmD  [phone] 01/31/2013 7:46 AM

## 2013-01-31 NOTE — ED Provider Notes (Signed)
Medical screening examination/treatment/procedure(s) were performed by non-physician practitioner and as supervising physician I was immediately available for consultation/collaboration.  Bensyn Bornemann T Maelynn Moroney, MD 01/31/13 0311 

## 2013-02-01 DIAGNOSIS — N390 Urinary tract infection, site not specified: Secondary | ICD-10-CM

## 2013-02-01 DIAGNOSIS — N189 Chronic kidney disease, unspecified: Secondary | ICD-10-CM

## 2013-02-01 LAB — CBC
MCH: 26.2 pg (ref 26.0–34.0)
Platelets: 256 10*3/uL (ref 150–400)
RBC: 4.51 MIL/uL (ref 3.87–5.11)
WBC: 15.2 10*3/uL — ABNORMAL HIGH (ref 4.0–10.5)

## 2013-02-01 LAB — GLUCOSE, CAPILLARY
Glucose-Capillary: 164 mg/dL — ABNORMAL HIGH (ref 70–99)
Glucose-Capillary: 162 mg/dL — ABNORMAL HIGH (ref 70–99)

## 2013-02-01 LAB — BASIC METABOLIC PANEL
GFR calc Af Amer: 41 mL/min — ABNORMAL LOW (ref >90)
GFR calc non Af Amer: 36 mL/min — ABNORMAL LOW (ref >90)
Potassium: 4.2 mEq/L (ref 3.5–5.1)
Sodium: 136 mEq/L (ref 135–145)

## 2013-02-01 LAB — PROTIME-INR: Prothrombin Time: 18.5 seconds — ABNORMAL HIGH (ref 11.6–15.2)

## 2013-02-01 MED ORDER — WARFARIN SODIUM 2.5 MG PO TABS
12.5000 mg | ORAL_TABLET | Freq: Once | ORAL | Status: AC
  Administered 2013-02-01: 12.5 mg via ORAL
  Filled 2013-02-01: qty 1

## 2013-02-01 MED ORDER — DEXTROSE 5 % IV SOLN
250.0000 mg | INTRAVENOUS | Status: DC
  Administered 2013-02-02: 250 mg via INTRAVENOUS
  Filled 2013-02-01: qty 250

## 2013-02-01 NOTE — Progress Notes (Signed)
Per RNCM, pt continued to refuse SNF this morning and adament that she can return home with family members. Pt family members have been unable to be reached to confirm that pt family can provide care for pt at home. RNCM left messages with pt family members and RN will notify if family pt arrives to hospital.  CSW to continue to follow to assist as appropriate.   Jacklynn Lewis, MSW, LCSWA  Clinical Social Work [phone]

## 2013-02-01 NOTE — Progress Notes (Signed)
ANTICOAGULATION CONSULT NOTE - Follow Up  Pharmacy Consult for warfarin Indication: hx PE  Allergies  Allergen Reactions  . Iohexol   . Ivp Dye (Iodinated Diagnostic Agents)     itching  . Bactrim Itching and Rash    Patient Measurements: Height: 5\' 5"  (165.1 cm) Weight: 243 lb 13.3 oz (110.6 kg) IBW/kg (Calculated) : 57  Vital Signs: Temp: 98.3 F (36.8 C) (05/08 0454) Temp src: Oral (05/08 0454) BP: 123/57 mmHg (05/08 0454) Pulse Rate: 89 (05/08 0454)  Labs:  Recent Labs  01/30/13 0239 01/31/13 0427 02/01/13 0506  HGB 12.3 11.6* 11.8*  HCT 36.9 35.3* 35.2*  PLT 292 259 256  LABPROT 19.1* 18.4* 18.5*  INR 1.66* 1.58* 1.59*  CREATININE 1.47* 1.24* 1.30*    Estimated Creatinine Clearance: 37 ml/min (by C-G formula based on Cr of 1.3).   Medical History: Past Medical History  Diagnosis Date  . Diabetes mellitus   . Shortness of breath     with activity  . CHF (congestive heart failure) may 2012  . Pulmonary embolus 03-23-11  . Arthritis     left leg  . UTI (lower urinary tract infection)     Medications:  Prescriptions prior to admission  Medication Sig Dispense Refill  . acetaminophen (TYLENOL) 500 MG tablet Take 500 mg by mouth every 6 (six) hours as needed. PAIN        . aspirin 325 MG tablet Take 325 mg by mouth every morning.        . beta carotene w/minerals (OCUVITE) tablet Take 1 tablet by mouth daily.        Marland Kitchen donepezil (ARICEPT) 5 MG tablet Take 5 mg by mouth every morning.       . furosemide (LASIX) 40 MG tablet Take 40 mg by mouth every morning.       Marland Kitchen glipiZIDE (GLUCOTROL) 10 MG tablet Take 10 mg by mouth 2 (two) times daily before a meal.      . insulin glargine (LANTUS) 100 UNIT/ML injection Inject 35 Units into the skin 2 (two) times daily.       Marland Kitchen levothyroxine (SYNTHROID, LEVOTHROID) 50 MCG tablet Take 50 mcg by mouth daily before breakfast.      . meloxicam (MOBIC) 15 MG tablet Take 15 mg by mouth daily.      . metFORMIN  (GLUCOPHAGE) 500 MG tablet Take 500 mg by mouth every evening.        . potassium chloride (KLOR-CON) 20 MEQ packet Take 20 mEq by mouth daily.       Marland Kitchen trimethoprim (TRIMPEX) 100 MG tablet Take 100 mg by mouth every morning.       . warfarin (COUMADIN) 5 MG tablet Take 7.5-10 mg by mouth every evening. 10mg  on Monday, Wednesday, AND Friday.  7.5mg  ON Sunday, Tuesday, Thursday, Saturday.        Assessment: 71 yof w/ hx PE admitted 5/6 w/ fever, AMS, and UTI. Pt is on chronic coumadin for hx PE, pharmac to dose while inpt  Dose PTA 10mg  MWF, 7.5mg  other days  INR subtx and holding steady - now 10mg  x 2 doses inpatient  No bleeding reported  Pt on Azithromycin/Rocephin - not likely to effect INR  Goal of Therapy:  INR 2-3   Plan:  1) Boost warfarin to 12.5mg  tonight 2) Daily INR   Hessie Knows, PharmD, BCPS Pager [phone] 02/01/2013 10:59 AM

## 2013-02-01 NOTE — Progress Notes (Addendum)
Patient ID: Sara Dennis, female   DOB: [birth year], 79 y.o.   MRN: [id number]  [facility] PROGRESS NOTE  Sara Dennis:[id number] DOB: 08-Oct-1921 DOA: 01/30/2013 PCP: Theodoro Kos, MD  Brief narrative: Pt admitted with AMS and history was not obtained form pt as a result. Pt apparently had 2-3 days duration of progressively worsening shortness of breath, associated with productive cough of yellow sputum, fevers, chills, poor oral intake and urinary urgency and frequency. TR[facility] asked to admit for UTI and CAP treatment.     Acute encephalopathy - this was determined to be secondary to UTI and CAP - continue Zithromax and Rocephin for now as pt is responding well and is at her baseline mental status  - resolved     UTI (urinary tract infection) - final urine cultures pending, Rocephin should cover    CAP (community acquired pneumonia) - supportive care with oxygen as needed, ABX as noted above    Leukocytosis, unspecified - secondary to CAP and UTI, WBC trending down    CHF (congestive heart failure) - family endorses diastolic CHF since 2012 - avoid ACEI due to renal failure     Chronic kidney disease stage III  - creatinine is stable    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 Top differential considerations include interstitial edema with atelectasis and viral / atypical respiratory infection.    Antibiotics:  Rocephin 05/05 -->  Zithromax 05/05 -->   Code Status: DNR Family Communication: Pt and daughter at bedside Disposition Plan: Home when medically stable, 1-2 days  HPI/Subjective: No events overnight.   Objective: Filed Vitals:   01/31/13 0409 01/31/13 1307  01/31/13 2126 02/01/13 0454  BP: 119/67 127/76 129/60 123/57  Pulse: 92 77 81 89  Temp: 97.9 F (36.6 C) 98 F (36.7 C) 98.9 F (37.2 C) 98.3 F (36.8 C)  TempSrc: Oral  Oral Oral  Resp: 16 18 18 18   Height:      Weight:      SpO2: 97% 99% 97% 98%    Intake/Output Summary (Last 24 hours) at 02/01/13 0956 Last data filed at 01/31/13 1300  Gross per 24 hour  Intake    240 ml  Output      0 ml  Net    240 ml    Exam:   General:  Pt is alert, follows commands appropriately, not in acute distress  Cardiovascular: Regular rate and rhythm, S1/S2, no murmurs, no rubs, no gallops  Respiratory: Clear to auscultation bilaterally, no wheezing, no crackles, no rhonchi  Abdomen: Soft, non tender, non distended, bowel sounds present, no guarding  Extremities: No edema, pulses DP and PT palpable bilaterally  Neuro: Grossly nonfocal  Data Reviewed: Basic Metabolic Panel:  Recent Labs Lab 01/30/13 0239 01/31/13 0427 02/01/13 0506  NA 139 137 136  K 5.0 4.1 4.2  CL 103 102 101  CO2 25 27 26   GLUCOSE 211* 159* 164*  BUN 30* 20 22  CREATININE 1.47* 1.24* 1.30*  CALCIUM 9.2 9.3 9.3   CBC:  Recent Labs Lab 01/30/13 0239 01/31/13 0427 02/01/13 0506  WBC 23.4* 17.2* 15.2*  NEUTROABS 20.8*  --   --   HGB 12.3 11.6* 11.8*  HCT 36.9 35.3*  35.2*  MCV 77.4* 78.3 78.0  PLT 292 259 256   CBG:  Recent Labs Lab 01/31/13 1657 01/31/13 1944 01/31/13 2355 02/01/13 0412 02/01/13 0731  GLUCAP 181* 223* 185* 164* 162*    Recent Results (from the past 240 hour(s))  URINE CULTURE     Status: None   Collection Time    01/30/13  2:41 AM      Result Value Range Status   Specimen Description URINE, CATHETERIZED   Final   Special Requests NONE   Final   Culture  Setup Time 01/30/2013 10:20   Final   Colony Count >=100,000 COLONIES/ML   Final   Culture GRAM NEGATIVE RODS   Final   Report Status PENDING   Incomplete  CULTURE, BLOOD (ROUTINE X 2)     Status: None    Collection Time    01/30/13  3:25 AM      Result Value Range Status   Specimen Description BLOOD LEFT WRIST   Final   Special Requests BOTTLES DRAWN AEROBIC AND ANAEROBIC [facility] EACH   Final   Culture  Setup Time 01/30/2013 10:28   Final   Culture     Final   Value:        BLOOD CULTURE RECEIVED NO GROWTH TO DATE CULTURE WILL BE HELD FOR 5 DAYS BEFORE ISSUING A FINAL NEGATIVE REPORT   Report Status PENDING   Incomplete  CULTURE, BLOOD (ROUTINE X 2)     Status: None   Collection Time    01/30/13  3:27 AM      Result Value Range Status   Specimen Description BLOOD RIGHT WRIST   Final   Special Requests BOTTLES DRAWN AEROBIC AND ANAEROBIC 5CC EACH   Final   Culture  Setup Time 01/30/2013 10:28   Final   Culture     Final   Value:        BLOOD CULTURE RECEIVED NO GROWTH TO DATE CULTURE WILL BE HELD FOR 5 DAYS BEFORE ISSUING A FINAL NEGATIVE REPORT   Report Status PENDING   Incomplete     Scheduled Meds: . aspirin  325 mg Oral Daily  . azithromycin  250 mg Intravenous Q24H  . cefTRIAXone (ROCEPHIN)  IV  1 g Intravenous Q24H  . donepezil  5 mg Oral Daily  . furosemide  40 mg Oral Daily  . insulin aspart  0-15 Units Subcutaneous Q4H  . insulin glargine  30 Units Subcutaneous BID  . levothyroxine  50 mcg Oral QAC breakfast  . pantoprazole  40 mg Oral Daily  . potassium chloride SA  20 mEq Oral Daily  . Warfarin - Pharmacist Dosing Inpatient   Does not apply q1800   Continuous Infusions:   Pamella Pert, MD  [facility] Pager [phone]  If 7PM-7AM, please contact night-coverage [email or web address] Password [email or web address] 02/01/2013, 9:56 AM   LOS: 2 days

## 2013-02-01 NOTE — Care Management Note (Addendum)
Cm spoke with patient at bedside concerning discharge planning. CM noted PT eval recommendations for SNF/24 hour supervision. Pt states choice of disposition is Home. Pt states lives home with adult daughter, adult son, and grandchildren. Pt states HH services not required due to assistance from multiple family members. Pt has tried to contact pt's daughter [name] to verify dc disposition without success. Pt request RW. MD sticky note entered to order dme prior to discharge.     Roxy Manns Curtisha Bendix,RN,BSN [phone]

## 2013-02-02 ENCOUNTER — Inpatient Hospital Stay (HOSPITAL_COMMUNITY): Payer: Medicare Other

## 2013-02-02 DIAGNOSIS — M25579 Pain in unspecified ankle and joints of unspecified foot: Secondary | ICD-10-CM

## 2013-02-02 LAB — URINE CULTURE

## 2013-02-02 LAB — GLUCOSE, CAPILLARY
Glucose-Capillary: 139 mg/dL — ABNORMAL HIGH (ref 70–99)
Glucose-Capillary: 148 mg/dL — ABNORMAL HIGH (ref 70–99)
Glucose-Capillary: 167 mg/dL — ABNORMAL HIGH (ref 70–99)
Glucose-Capillary: 188 mg/dL — ABNORMAL HIGH (ref 70–99)

## 2013-02-02 LAB — CBC
HCT: 35.3 % — ABNORMAL LOW (ref 36.0–46.0)
MCH: 25.4 pg — ABNORMAL LOW (ref 26.0–34.0)
MCV: 77.4 fL — ABNORMAL LOW (ref 78.0–100.0)
Platelets: 282 10*3/uL (ref 150–400)
RBC: 4.56 MIL/uL (ref 3.87–5.11)

## 2013-02-02 LAB — BASIC METABOLIC PANEL
CO2: 28 mEq/L (ref 19–32)
Calcium: 9.4 mg/dL (ref 8.4–10.5)
Chloride: 100 mEq/L (ref 96–112)
Glucose, Bld: 141 mg/dL — ABNORMAL HIGH (ref 70–99)
Sodium: 136 mEq/L (ref 135–145)

## 2013-02-02 MED ORDER — LEVOFLOXACIN 500 MG PO TABS
500.0000 mg | ORAL_TABLET | Freq: Every day | ORAL | Status: DC
  Administered 2013-02-02 – 2013-02-05 (×4): 500 mg via ORAL
  Filled 2013-02-02 (×4): qty 1

## 2013-02-02 MED ORDER — WARFARIN SODIUM 2.5 MG PO TABS
12.5000 mg | ORAL_TABLET | Freq: Once | ORAL | Status: AC
  Administered 2013-02-02: 12.5 mg via ORAL
  Filled 2013-02-02: qty 1

## 2013-02-02 MED ORDER — MELOXICAM 15 MG PO TABS
15.0000 mg | ORAL_TABLET | Freq: Every day | ORAL | Status: DC
  Administered 2013-02-02 – 2013-02-05 (×4): 15 mg via ORAL
  Filled 2013-02-02 (×4): qty 1

## 2013-02-02 NOTE — Progress Notes (Signed)
Patient ID: Sara Dennis, female   DOB: 07-13-1922, 79 y.o.   MRN: [id number]  [facility] PROGRESS NOTE  AMARACHI KOTZ FAO:[id number] DOB: [birth year] DOA: 01/30/2013 PCP: Theodoro Kos, MD  Brief narrative: Pt admitted with AMS and history was not obtained form pt as a result. Pt apparently had 2-3 days duration of progressively worsening shortness of breath, associated with productive cough of yellow sputum, fevers, chills, poor oral intake and urinary urgency and frequency. TRH asked to admit for UTI and CAP treatment.   UTI (urinary tract infection) - final urine cultures pending, Rocephin should cover, today day 4 from planned 7  Acute encephalopathy - this was determined to be secondary to UTI and CAP - continue Zithromax and Rocephin for now as pt is responding well and is at her baseline mental status  - resolved  Acute right ankle pain - patient is in extreme pain in her right ankle, and states that is unable to walk due to that - no history of gout - XR ankle today    CAP (community acquired pneumonia) - supportive care with oxygen as needed, ABX as noted above    Leukocytosis, unspecified - secondary to CAP and UTI, WBC trending down    CHF (congestive heart failure) - family endorses diastolic CHF since 2012 - avoid ACEI due to renal failure     Chronic kidney disease stage III  - creatinine is stable    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 Top differential considerations include interstitial edema with atelectasis and viral / atypical respiratory infection.    Antibiotics:  Rocephin 05/05 -->  Zithromax 05/05 -->   Code Status: DNR Family Communication:  Pt and daughter at bedside Disposition Plan: Home when medically stable, 1-2 days  HPI/Subjective: No events overnight.   Objective: Filed Vitals:   02/01/13 0454 02/01/13 1425 02/01/13 2235 02/02/13 0632  BP: 123/57 139/68 160/76 157/55  Pulse: 89 77 81 83  Temp: 98.3 F (36.8 C) 98.2 F (36.8 C) 99.5 F (37.5 C) 98.6 F (37 C)  TempSrc: Oral  Oral Oral  Resp: 18 16 18 18   Height:      Weight:      SpO2: 98% 97% 99% 99%    Intake/Output Summary (Last 24 hours) at 02/02/13 1326 Last data filed at 02/02/13 0900  Gross per 24 hour  Intake    240 ml  Output      0 ml  Net    240 ml    Exam:   General:  Pt is alert, follows commands appropriately, not in acute distress  Cardiovascular: Regular rate and rhythm, S1/S2, no murmurs, no rubs, no gallops  Respiratory: Clear to auscultation bilaterally, no wheezing, no crackles, no rhonchi  Abdomen: Soft, non tender, non distended, bowel sounds present, no guarding  Extremities: No edema, pulses DP and PT palpable bilaterally; right ankle pain on medial aspect with palpation  Neuro: Grossly nonfocal  Data Reviewed: Basic Metabolic Panel:  Recent Labs Lab 01/30/13 0239 01/31/13 0427 02/01/13 0506 02/02/13 0443  NA 139 137 136 136  K 5.0 4.1 4.2 4.1  CL 103 102 101 100  CO2 25 27 26 28   GLUCOSE 211* 159* 164* 141*  BUN 30* 20 22 19   CREATININE 1.47* 1.24* 1.30* 1.12*  CALCIUM 9.2 9.3 9.3 9.4   CBC:  Recent Labs Lab 01/30/13 0239 01/31/13 0427 02/01/13 0506 02/02/13 0443  WBC 23.4* 17.2* 15.2* 15.4*  NEUTROABS 20.8*  --   --   --   HGB 12.3 11.6* 11.8* 11.6*  HCT 36.9 35.3* 35.2* 35.3*  MCV 77.4* 78.3 78.0 77.4*  PLT 292 259 256 282   CBG:  Recent Labs Lab 02/01/13 1719 02/01/13 2022 02/02/13 0004 02/02/13 0423 02/02/13 0756  GLUCAP 154* 152* 167* 148* 139*    Recent Results (from the past 240 hour(s))  URINE CULTURE     Status: None   Collection Time    01/30/13  2:41 AM      Result  Value Range Status   Specimen Description URINE, CATHETERIZED   Final   Special Requests NONE   Final   Culture  Setup Time 01/30/2013 10:20   Final   Colony Count >=100,000 COLONIES/ML   Final   Culture GRAM NEGATIVE RODS   Final   Report Status PENDING   Incomplete  CULTURE, BLOOD (ROUTINE X 2)     Status: None   Collection Time    01/30/13  3:25 AM      Result Value Range Status   Specimen Description BLOOD LEFT WRIST   Final   Special Requests BOTTLES DRAWN AEROBIC AND ANAEROBIC [facility] EACH   Final   Culture  Setup Time 01/30/2013 10:28   Final   Culture     Final   Value:        BLOOD CULTURE RECEIVED NO GROWTH TO DATE CULTURE WILL BE HELD FOR 5 DAYS BEFORE ISSUING A FINAL NEGATIVE REPORT   Report Status PENDING   Incomplete  CULTURE, BLOOD (ROUTINE X 2)     Status: None   Collection Time    01/30/13  3:27 AM      Result Value Range Status   Specimen Description BLOOD RIGHT WRIST   Final   Special Requests BOTTLES DRAWN AEROBIC AND ANAEROBIC 5CC EACH   Final   Culture  Setup Time 01/30/2013 10:28   Final   Culture     Final   Value:        BLOOD CULTURE RECEIVED NO GROWTH TO DATE CULTURE WILL BE HELD FOR 5 DAYS BEFORE ISSUING A FINAL NEGATIVE REPORT   Report Status PENDING   Incomplete     Scheduled Meds: . aspirin  325 mg Oral Daily  . azithromycin  250 mg Intravenous Q24H  . cefTRIAXone (ROCEPHIN)  IV  1 g Intravenous Q24H  . donepezil  5 mg Oral Daily  . furosemide  40 mg Oral Daily  . insulin aspart  0-15 Units Subcutaneous Q4H  . insulin glargine  30 Units Subcutaneous BID  . levothyroxine  50 mcg Oral QAC breakfast  . pantoprazole  40 mg Oral Daily  . potassium chloride SA  20 mEq Oral Daily  . warfarin  12.5 mg Oral ONCE-1800  . Warfarin - Pharmacist Dosing Inpatient   Does not apply q1800   Continuous Infusions:   Pamella Pert, MD  [facility] Pager [phone]  If 7PM-7AM, please contact night-coverage [email or web address] Password [email or web address] 02/02/2013, 1:26 PM   LOS: 3  days

## 2013-02-02 NOTE — Progress Notes (Signed)
Per MD, MD had discussion with pt children last night and pt children are agreeable to pt returning home and pt children feel they can manage pt care. RNCM aware. No further social work needs identified at this time. CSW signing off.    Jacklynn Lewis, MSW, LCSWA  Clinical Social Work [phone]

## 2013-02-02 NOTE — Progress Notes (Signed)
ANTICOAGULATION CONSULT NOTE - Follow Up  Pharmacy Consult for warfarin Indication: hx PE  Allergies  Allergen Reactions  . Iohexol   . Ivp Dye (Iodinated Diagnostic Agents)     itching  . Bactrim Itching and Rash    Patient Measurements: Height: 5\' 5"  (165.1 cm) Weight: 243 lb 13.3 oz (110.6 kg) IBW/kg (Calculated) : 57  Vital Signs: Temp: 98.6 F (37 C) (05/09 1610) Temp src: Oral (05/09 9604) BP: 157/55 mmHg (05/09 0632) Pulse Rate: 83 (05/09 0632)  Labs:  Recent Labs  01/31/13 0427 02/01/13 0506 02/02/13 0443  HGB 11.6* 11.8* 11.6*  HCT 35.3* 35.2* 35.3*  PLT 259 256 282  LABPROT 18.4* 18.5* 19.6*  INR 1.58* 1.59* 1.72*  CREATININE 1.24* 1.30* 1.12*    Estimated Creatinine Clearance: 43 ml/min (by C-G formula based on Cr of 1.12).   Medical History: Past Medical History  Diagnosis Date  . Diabetes mellitus   . Shortness of breath     with activity  . CHF (congestive heart failure) may 2012  . Pulmonary embolus 03-23-11  . Arthritis     left leg  . UTI (lower urinary tract infection)     Medications:  Prescriptions prior to admission  Medication Sig Dispense Refill  . acetaminophen (TYLENOL) 500 MG tablet Take 500 mg by mouth every 6 (six) hours as needed. PAIN        . aspirin 325 MG tablet Take 325 mg by mouth every morning.        . beta carotene w/minerals (OCUVITE) tablet Take 1 tablet by mouth daily.        Marland Kitchen donepezil (ARICEPT) 5 MG tablet Take 5 mg by mouth every morning.       . furosemide (LASIX) 40 MG tablet Take 40 mg by mouth every morning.       Marland Kitchen glipiZIDE (GLUCOTROL) 10 MG tablet Take 10 mg by mouth 2 (two) times daily before a meal.      . insulin glargine (LANTUS) 100 UNIT/ML injection Inject 35 Units into the skin 2 (two) times daily.       Marland Kitchen levothyroxine (SYNTHROID, LEVOTHROID) 50 MCG tablet Take 50 mcg by mouth daily before breakfast.      . meloxicam (MOBIC) 15 MG tablet Take 15 mg by mouth daily.      . metFORMIN  (GLUCOPHAGE) 500 MG tablet Take 500 mg by mouth every evening.        . potassium chloride (KLOR-CON) 20 MEQ packet Take 20 mEq by mouth daily.       Marland Kitchen trimethoprim (TRIMPEX) 100 MG tablet Take 100 mg by mouth every morning.       . warfarin (COUMADIN) 5 MG tablet Take 7.5-10 mg by mouth every evening. 10mg  on Monday, Wednesday, AND Friday.  7.5mg  ON Sunday, Tuesday, Thursday, Saturday.        Assessment: 44 yof w/ hx PE admitted 5/6 w/ fever, AMS, and UTI. Pt is on chronic coumadin for hx PE, pharmacy to dose while inpt  Dose PTA 10mg  MWF, 7.5mg  other days  INR subtx but rising now - now 10mg  x 2 doses and 12.5mg  x 1 dose as inpatient  No bleeding reported  Pt on Azithromycin/Rocephin - not likely to effect INR  Goal of Therapy:  INR 2-3   Plan:  1) Repeat warfarin 12.5mg  tonight 2) Daily INR   Hessie Knows, PharmD, BCPS Pager [phone] 02/02/2013 10:53 AM

## 2013-02-02 NOTE — Care Management Note (Addendum)
UR updated. Cm previously attempted to contact family for dc planning, upon CM conversation with pt at bedside on 02/01/13 in which pt states disposition is home. No request for [facility] services. Pt request RW ONLY! Per MD, had conversation with family whom states pt is correct, pt to dc home with family providing care.   Roxy Manns Amberlea Spagnuolo,RN,BSN [phone]

## 2013-02-02 NOTE — Progress Notes (Signed)
Physical Therapy Treatment Patient Details Name: DORETTA REMMERT MRN: [id number] DOB: 24-Feb-1922 Today's Date: 02/02/2013 Time: 4098-1191 PT Time Calculation (min): 24 min  PT Assessment / Plan / Recommendation Comments on Treatment Session  Pt in bed with many family members in the room.  Right after introduction pt stated, "I aint getting up today".  Family very encouraging and stated to pt that they need her to get up so they can take her home by Mother's Day.  Pt required MAX positive encouragment and was only able to get to the side of the bed.  Attempted standing four times however pt did not put forth the effort.  Returned to bed + 2 assist.  Pt stated she could not walk because her feet hurt too bad and that "they are not giving me my arthritis medicine".     Follow Up Recommendations  SNF;Supervision/Assistance - 24 hour     Does the patient have the potential to tolerate intense rehabilitation     Barriers to Discharge        Equipment Recommendations  Rolling walker with 5" wheels    Recommendations for Other Services    Frequency Min 3X/week   Plan      Precautions / Restrictions Precautions Precautions: Fall Restrictions Weight Bearing Restrictions: No   Pertinent Vitals/Pain C/o B feet pain R>L    Mobility  Bed Mobility Bed Mobility: Supine to Sit;Sit to Supine Supine to Sit: 2: Max assist Sit to Supine: 1: +2 Total assist Sit to Supine: Patient Percentage: 30% Details for Bed Mobility Assistance: MAX encouragement to increase self assist as pt stated "I aint getting up again".  HOB elevated and use of pad to transfer pt from supine to EOB.  Once upright pt sat EOB x 7 min at Mod I trying to convince pt to get up on her feet.  Assisted back to supine + 2 total assist as pt was unable to lift B LE's onto bed. Transfers Transfers: Sit to Stand;Stand to Sit Sit to Stand: 1: +2 Total assist Sit to Stand: Patient Percentage: 10% Stand to Sit: 1: +2 Total  assist Stand to Sit: Patient Percentage: 10% Details for Transfer Assistance: Attempted x 4 with family support as well.  Pt c/o feet hurting to bad and did not put forth effort.  Ambulation/Gait Ambulation/Gait Assistance Details: Pt did not put forth effort to attempt despite family encouragement that they are planning to take her home by Mother's Day.    Exercises     PT Diagnosis:    PT Problem List:   PT Treatment Interventions:     PT Goals    Visit Information  Last PT Received On: 02/02/13 Assistance Needed: +2    Subjective Data  Subjective: I can't walk, my feet hurt too bad Patient Stated Goal: wants her Arthritis medication for her feet   Cognition       Balance     End of Session PT - End of Session Equipment Utilized During Treatment: Gait belt Activity Tolerance: Other (comment) (self limiting) Patient left: in bed;with call bell/phone within reach;with family/visitor present   GP     Armando Reichert 02/02/2013, 3:30 PM

## 2013-02-03 LAB — GLUCOSE, CAPILLARY
Glucose-Capillary: 155 mg/dL — ABNORMAL HIGH (ref 70–99)
Glucose-Capillary: 168 mg/dL — ABNORMAL HIGH (ref 70–99)
Glucose-Capillary: 176 mg/dL — ABNORMAL HIGH (ref 70–99)

## 2013-02-03 MED ORDER — LEVOFLOXACIN 500 MG PO TABS: 500.0000 mg | ORAL_TABLET | Freq: Every day | ORAL | Status: DC

## 2013-02-03 MED ORDER — WARFARIN SODIUM 7.5 MG PO TABS
7.5000 mg | ORAL_TABLET | Freq: Once | ORAL | Status: AC
  Administered 2013-02-03: 7.5 mg via ORAL
  Filled 2013-02-03: qty 1

## 2013-02-03 NOTE — Progress Notes (Signed)
ANTICOAGULATION CONSULT NOTE - Follow Up  Pharmacy Consult for warfarin Indication: hx PE  Allergies  Allergen Reactions  . Iohexol   . Ivp Dye (Iodinated Diagnostic Agents)     itching  . Bactrim Itching and Rash    Patient Measurements: Height: 5\' 5"  (165.1 cm) Weight: 243 lb 13.3 oz (110.6 kg) IBW/kg (Calculated) : 57  Vital Signs: Temp: 98.6 F (37 C) (05/10 0406) Temp src: Oral (05/10 0406) BP: 138/66 mmHg (05/10 0406) Pulse Rate: 82 (05/10 0406)  Labs:  Recent Labs  02/01/13 0506 02/02/13 0443 02/03/13 0451  HGB 11.8* 11.6*  --   HCT 35.2* 35.3*  --   PLT 256 282  --   LABPROT 18.5* 19.6* 24.1*  INR 1.59* 1.72* 2.28*  CREATININE 1.30* 1.12*  --     Estimated Creatinine Clearance: 43 ml/min (by C-G formula based on Cr of 1.12).  Medications:  Prescriptions prior to admission  Medication Sig Dispense Refill  . acetaminophen (TYLENOL) 500 MG tablet Take 500 mg by mouth every 6 (six) hours as needed. PAIN        . aspirin 325 MG tablet Take 325 mg by mouth every morning.        . beta carotene w/minerals (OCUVITE) tablet Take 1 tablet by mouth daily.        Marland Kitchen donepezil (ARICEPT) 5 MG tablet Take 5 mg by mouth every morning.       . furosemide (LASIX) 40 MG tablet Take 40 mg by mouth every morning.       Marland Kitchen glipiZIDE (GLUCOTROL) 10 MG tablet Take 10 mg by mouth 2 (two) times daily before a meal.      . insulin glargine (LANTUS) 100 UNIT/ML injection Inject 35 Units into the skin 2 (two) times daily.       Marland Kitchen levothyroxine (SYNTHROID, LEVOTHROID) 50 MCG tablet Take 50 mcg by mouth daily before breakfast.      . meloxicam (MOBIC) 15 MG tablet Take 15 mg by mouth daily.      . metFORMIN (GLUCOPHAGE) 500 MG tablet Take 500 mg by mouth every evening.        . potassium chloride (KLOR-CON) 20 MEQ packet Take 20 mEq by mouth daily.       Marland Kitchen trimethoprim (TRIMPEX) 100 MG tablet Take 100 mg by mouth every morning.       . warfarin (COUMADIN) 5 MG tablet Take 7.5-10 mg  by mouth every evening. 10mg  on Monday, Wednesday, AND Friday.  7.5mg  ON Sunday, Tuesday, Thursday, Saturday.        Assessment: 86 yof w/ hx PE admitted 5/6 w/ fever, AMS, and UTI. Pt is on chronic coumadin for hx PE, pharmacy to dose while inpt  Dose PTA 10mg  MWF, 7.5mg  other days. Also on full dose aspirin daily.  INR now therapeutic 2.28 after boosted doses since admission  No bleeding reported  Now on day #2 levaquin which may potentiate warfarin effects and increase INR. Also, home mobic resumed.  Goal of Therapy:  INR 2-3   Plan:   Decrease warfarin back to home dose of 7.5mg  today  Daily PT/INR   Loralee Pacas, PharmD, BCPS Pager: [phone]/06/2013 9:33 AM

## 2013-02-03 NOTE — Discharge Instructions (Signed)
Follow with Primary MD Early Chars B, MD in 3-4 days (early next week)   Get CBC, CMP, INR checked by Primary MD soon and again as instructed by your Primary MD. Get a 2 view Chest X ray done next visit if you had Pneumonia of Lung problems at the Hospital.  Get Medicines reviewed and adjusted.  Please request your Prim.MD to go over all Hospital Tests and Procedure/Radiological results at the follow up, please get all Hospital records sent to your Prim MD by signing hospital release before you go home.  Activity: As tolerated with Full fall precautions use walker/cane & assistance as needed   Diet:  Heart healthy, Aspiration precautions.  For Heart failure patients - Check your Weight same time everyday, if you gain over 2 pounds, or you develop in leg swelling, experience more shortness of breath or chest pain, call your Primary MD immediately. Follow Cardiac Low Salt Diet and 1.8 lit/day fluid restriction.  Disposition Home  If you experience worsening of your admission symptoms, develop shortness of breath, life threatening emergency, suicidal or homicidal thoughts you must seek medical attention immediately by calling 911 or calling your MD immediately  if symptoms less severe.  You Must read complete instructions/literature along with all the possible adverse reactions/side effects for all the Medicines you take and that have been prescribed to you. Take any new Medicines after you have completely understood and accpet all the possible adverse reactions/side effects.   Do not drive and provide baby sitting services if your were admitted for syncope or siezures until you have seen by Primary MD or a Neurologist and advised to do so again.  Do not drive when taking Pain medications.    Do not take more than prescribed Pain, Sleep and Anxiety Medications  Special Instructions: If you have smoked or chewed Tobacco  in the last 2 yrs please stop smoking, stop any regular Alcohol  and  or any Recreational drug use.  Wear Seat belts while driving.

## 2013-02-03 NOTE — Discharge Summary (Addendum)
Physician Discharge Summary  Sara Dennis WGN:[id number] DOB: 05-14-1922 DOA: 01/30/2013  PCP: Theodoro Kos, MD  Admit date: 01/30/2013 Discharge date: 02/05/2013  Time spent: 45 minutes  Recommendations for Outpatient Follow-up:  1. Please follow up in 3-4 days with PCP.    Recommendations for primary care physician for things to follow:  - please follow up for INR  - consider repeating a CXR - INR 3.12 on 5/12 am. Please hold Coumadin dose 5/12 evening and repeat INR level 5/13. Restart as indicated.  - continue Levofloxacin for 1 additional day until 5/13  Discharge Diagnoses:  Principal Problem:   Acute encephalopathy Active Problems:   UTI (urinary tract infection)   CAP (community acquired pneumonia)   Leukocytosis, unspecified   CHF (congestive heart failure)   Chronic kidney disease stage III  Discharge Condition: stable  Diet recommendation: heart healthy  Filed Weights   01/30/13 0428  Weight: 110.6 kg (243 lb 13.3 oz)   History of present illness:  Pt admitted with AMS and history was not obtained form pt as a result. Pt apparently had 2-3 days duration of progressively worsening shortness of breath, associated with productive cough of yellow sputum, fevers, chills, poor oral intake and urinary urgency and frequency. TRH asked to admit for UTI and CAP treatment.  Hospital Course:  UTI (urinary tract infection) - urine cultures show Enterobacter Cloacae sensitive to Levofloxacin. Patient was initially on Ceftriaxone and was narrowed down once sensitivities were available.   Acute encephalopathy this was determined to be secondary to UTI and CAP; resolved.  Acute right ankle pain - due to chronic arthritis, improved once home medications were restarted. PT asked to evaluate patient and recommended SNF on discharge however patient declined and insisted taht she goes home. Offered HHPT and patient again declined initially however accepted shortly before discharge. I  talked to her daughters and family in agreement and asking that patient returns home on discharge. D/[facility] with [facility], RN and aide.  CAP (community acquired pneumonia) supportive care with oxygen as needed, on Levaquin. To complete total of 8 days treatment which would cover for #1 as well.  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 Wall thickness was increased in a pattern of moderate LVH. There was mild focal basal hypertrophy of the septum. Systolic function was normal. The estimated ejection fraction was in the range of 55% to 60%. - Aortic valve: Small gradient across the AV Mild regurgitation. Valve area: 1.5cm^2 (Vmax). - Mitral valve: Mild regurgitation. - Left atrium: The atrium was mildly dilated. - Pulmonary arteries: PA peak pressure: 45mm Hg (S).  Consultations:  none  Discharge Exam: Filed Vitals:   02/04/13 1422 02/04/13 2017 02/04/13 2121 02/05/13 0401  BP: 126/51 115/63 137/60 104/47  Pulse: 61 66 70 64  Temp: 97.7 F (36.5 C) 97.4 F (36.3 C) 98.5 F (36.9 C) 98.3 F (36.8 C)  TempSrc: Oral Oral Oral Oral  Resp: 18 20 16 15   Height:      Weight:      SpO2: 100% 100% 100% 99%   Patient seen and examined 5/12. No changes. General: NAD Cardiovascular: RRR Respiratory: CTA biL  Discharge Instructions     Medication List    TAKE these medications       acetaminophen 500 MG tablet  Commonly known as:  TYLENOL  Take 500 mg by mouth every 6 (six) hours as needed. PAIN       aspirin 325 MG tablet  Take 325 mg by mouth every morning.     beta carotene w/minerals tablet  Take 1 tablet  by mouth daily.     donepezil 5 MG tablet  Commonly known as:  ARICEPT  Take 5 mg by mouth every morning.     furosemide 40 MG tablet  Commonly known as:  LASIX  Take 40 mg by mouth every morning.     glipiZIDE 10 MG tablet  Commonly known as:  GLUCOTROL  Take 10 mg by mouth 2 (two) times daily before a meal.     insulin glargine 100 UNIT/ML injection  Commonly known as:  LANTUS  Inject 35 Units into the skin 2 (two) times daily.     levofloxacin 500 MG tablet  Commonly known as:  LEVAQUIN  Take 1 tablet (500 mg total) by mouth daily.     levothyroxine 50 MCG tablet  Commonly known as:  SYNTHROID, LEVOTHROID  Take 50 mcg by mouth daily before breakfast.     meloxicam 15 MG tablet  Commonly known as:  MOBIC  Take 15 mg by mouth daily.     metFORMIN 500 MG tablet  Commonly known as:  GLUCOPHAGE  Take 500 mg by mouth every evening.     potassium chloride 20 MEQ packet  Commonly known as:  KLOR-CON  Take 20 mEq by mouth daily.     trimethoprim 100 MG tablet  Commonly known as:  TRIMPEX  Take 100 mg by mouth every morning.     warfarin 5 MG tablet  Commonly known as:  COUMADIN  Take 7.5-10 mg by mouth every evening. 10mg  on Monday, Wednesday, AND Friday.   7.5mg  ON Sunday, Tuesday, Thursday, Saturday.         The results of significant diagnostics from this hospitalization (including imaging, microbiology, ancillary and laboratory) are listed below for reference.    Significant Diagnostic Studies: Dg Ankle Complete Right  02/02/2013  *RADIOLOGY REPORT*  Clinical Data: Right ankle pain.  RIGHT ANKLE - COMPLETE 3+ VIEW  Comparison: None.  Findings: Anatomic alignment.  The ankle mortise is congruent. Calcaneal spurs incidentally noted.  Infiltration in the soft tissues of the medial leg, suggesting varicosities.  Talar dome intact.  Mild to moderate talonavicular osteoarthritis.  IMPRESSION: No acute osseous abnormality.  Talonavicular osteoarthritis. Probable  varicosities in the medial leg.   Original Report Authenticated By: Andreas Newport, M.D.    Dg Chest Port 1 View  (if Code Sepsis Called)  01/30/2013  *RADIOLOGY REPORT*  Clinical Data: 79 year old female with fever shortness of breath urinary tract infection.  PORTABLE CHEST - 1 VIEW  Comparison: 09/06/2011 and earlier.  Findings: Portable semi upright AP view 0230 hours.  Continued low lung volumes.  Increased interstitial markings.  Confluent retrocardiac opacity.  Stable cardiac size and mediastinal contours.  No pneumothorax or definite pleural effusion.  Advanced degenerative osseous changes at the left shoulder.  IMPRESSION: Increased interstitial opacity and confluent left lung base opacity. Top differential considerations include interstitial edema with atelectasis and viral / atypical respiratory infection.   Original Report Authenticated By: Erskine Speed, M.D.     Microbiology: Recent Results (from the past 240 hour(s))  URINE  CULTURE     Status: None   Collection Time    01/30/13  2:41 AM      Result Value Range Status   Specimen Description URINE, CATHETERIZED   Final   Special Requests NONE   Final   Culture  Setup Time 01/30/2013 10:20   Final   Colony Count >=100,000 COLONIES/ML   Final   Culture     Final   Value: ENTEROBACTER CLOACAE     Note: Two isolates with different morphologies were identified as the same organism.The most resistant organism was reported.   Report Status 02/02/2013 FINAL   Final   Organism ID, Bacteria ENTEROBACTER CLOACAE   Final  CULTURE, BLOOD (ROUTINE X 2)     Status: None   Collection Time    01/30/13  3:25 AM      Result Value Range Status   Specimen Description BLOOD LEFT WRIST   Final   Special Requests BOTTLES DRAWN AEROBIC AND ANAEROBIC [facility] EACH   Final   Culture  Setup Time 01/30/2013 10:28   Final   Culture     Final   Value:        BLOOD CULTURE RECEIVED NO GROWTH TO DATE CULTURE WILL BE HELD FOR 5 DAYS BEFORE ISSUING A FINAL NEGATIVE  REPORT   Report Status PENDING   Incomplete  CULTURE, BLOOD (ROUTINE X 2)     Status: None   Collection Time    01/30/13  3:27 AM      Result Value Range Status   Specimen Description BLOOD RIGHT WRIST   Final   Special Requests BOTTLES DRAWN AEROBIC AND ANAEROBIC 5CC EACH   Final   Culture  Setup Time 01/30/2013 10:28   Final   Culture     Final   Value:        BLOOD CULTURE RECEIVED NO GROWTH TO DATE CULTURE WILL BE HELD FOR 5 DAYS BEFORE ISSUING A FINAL NEGATIVE REPORT   Report Status PENDING   Incomplete     Labs: Basic Metabolic Panel:  Recent Labs Lab 01/30/13 0239 01/31/13 0427 02/01/13 0506 02/02/13 0443  NA 139 137 136 136  K 5.0 4.1 4.2 4.1  CL 103 102 101 100  CO2 25 27 26 28   GLUCOSE 211* 159* 164* 141*  BUN 30* 20 22 19   CREATININE 1.47* 1.24* 1.30* 1.12*  CALCIUM 9.2 9.3 9.3 9.4   CBC:  Recent Labs Lab 01/30/13 0239 01/31/13 0427 02/01/13 0506 02/02/13 0443  WBC 23.4* 17.2* 15.2* 15.4*  NEUTROABS 20.8*  --   --   --   HGB 12.3 11.6* 11.8* 11.6*  HCT 36.9 35.3* 35.2* 35.3*  MCV 77.4* 78.3 78.0 77.4*  PLT 292 259 256 282   BNP: BNP (last 3 results)  Recent Labs  01/30/13 0239  PROBNP 1504.0*   CBG:  Recent Labs Lab 02/04/13 1656 02/04/13 2015 02/04/13 2359 02/05/13 0359 02/05/13 0719  GLUCAP 164* 182* 179* 109* 113*   Signed:  Pamella Pert  [facility] 02/05/2013, 8:48 AM

## 2013-02-03 NOTE — Progress Notes (Signed)
Physical Therapy Treatment Patient Details Name: Sara Dennis MRN: [id number] DOB: 1922-01-23 Today's Date: 02/03/2013 Time: 1414-1430 PT Time Calculation (min): 16 min  PT Assessment / Plan / Recommendation Comments on Treatment Session  Pt agreeable to ambulate in hallway so that she could return home today, however pt requires +2 assist for safety and MAX cues for safety.  Pt VERY unsafe and highly recommend she have 2 people assist her at home with mobility.  Also recommended HHPT however pt declines.     Follow Up Recommendations  SNF;Supervision/Assistance - 24 hour     Does the patient have the potential to tolerate intense rehabilitation     Barriers to Discharge        Equipment Recommendations  Rolling walker with 5" wheels    Recommendations for Other Services    Frequency Min 3X/week   Plan Discharge plan remains appropriate    Precautions / Restrictions Precautions Precautions: Fall Restrictions Weight Bearing Restrictions: No   Pertinent Vitals/Pain Pt with max c/o pain in B feet, however is improved since last session.     Mobility  Bed Mobility Bed Mobility: Supine to Sit Supine to Sit: 5: Supervision Details for Bed Mobility Assistance: Pt able to get herself to EOB today at supervision level for safety.  Transfers Transfers: Sit to Stand;Stand to Sit Sit to Stand: 1: +2 Total assist;With upper extremity assist;From bed;From chair/3-in-1 Sit to Stand: Patient Percentage: 50% Stand to Sit: 1: +2 Total assist Stand to Sit: Patient Percentage: 50% Details for Transfer Assistance: Assist to rise, steady and ensure controlled descent.  MAX cues for hand placement and safety as she pulls up using RW and "flops" in chair.  Ambulation/Gait Ambulation/Gait Assistance: 1: +2 Total assist Ambulation/Gait: Patient Percentage: 50% Ambulation Distance (Feet): 10 Feet (then 10 more) Assistive device: Rolling walker Ambulation/Gait Assistance Details: Assist to  steady throughout with MAX cues for sequencing/technique with RW, upright posture and safety.  Gait Pattern: Step-to pattern;Decreased stride length;Antalgic;Trunk flexed Gait velocity: decreased    Exercises     PT Diagnosis:    PT Problem List:   PT Treatment Interventions:     PT Goals Acute Rehab PT Goals PT Goal Formulation: With patient Time For Goal Achievement: 02/14/13 Potential to Achieve Goals: Good Pt will go Supine/Side to Sit: with modified independence PT Goal: Supine/Side to Sit - Progress: Progressing toward goal Pt will go Sit to Stand: with modified independence PT Goal: Sit to Stand - Progress: Progressing toward goal Pt will go Stand to Sit: with modified independence PT Goal: Stand to Sit - Progress: Progressing toward goal Pt will Ambulate: 51 - 150 feet;with modified independence;with least restrictive assistive device PT Goal: Ambulate - Progress: Progressing toward goal  Visit Information  Last PT Received On: 02/03/13 Assistance Needed: +2    Subjective Data  Subjective: My feet still hurt, but I will walk so I can leave.  Patient Stated Goal: wants her Arthritis medication for her feet   Cognition  Cognition Arousal/Alertness: Awake/alert Behavior During Therapy: Impulsive Overall Cognitive Status: No family/caregiver present to determine baseline cognitive functioning    Balance     End of Session PT - End of Session Equipment Utilized During Treatment: Gait belt Activity Tolerance: Patient limited by pain Patient left: in chair;with call bell/phone within reach Nurse Communication: Mobility status   GP     Vista Deck 02/03/2013, 3:02 PM

## 2013-02-03 NOTE — Care Management Note (Addendum)
    Page 1 of 2   02/03/2013     7:02:50 PM   CARE MANAGEMENT NOTE 02/03/2013  Patient:  Sara Dennis, Sara Dennis   Account Number:  [id number]  Date Initiated:  01/30/2013  Documentation initiated by:  DAVIS,TYMEEKA  Subjective/Objective Assessment:   79 yo female admitted with UTI & CAP. PTA pt lived home with adult son.     Action/Plan:   Home when stable vs SNF   Anticipated DC Date:  02/05/2013   Anticipated DC Plan:  SKILLED NURSING FACILITY  In-house referral  Clinical Social Worker      DC Planning Services  CM consult      Choice offered to / List presented to:  NA           Status of service:  In process, will continue to follow Medicare Important Message given?  NA - LOS <3 / Initial given by admissions (If response is "NO", the following Medicare IM given date fields will be blank) Date Medicare IM given:  01/30/2013 Date Additional Medicare IM given:  02/03/2013  Discharge Disposition:    Per UR Regulation:  Reviewed for med. necessity/level of care/duration of stay  If discussed at Long Length of Stay Meetings, dates discussed:    Comments:  02/03/13 [name] RN,BSN NCM WEEKEND [phone] RECEIVED ORDER FOR RW.[facility] BROUGHT RW TO PATIENT'S RM.D/C ORDER TO HOME PLACED.RECEIVED CALL FROM NURSE TO DISCUSS HH W/PATIENT.MET PATIENT IN RM W/DTR Sara Dennis,& OTHER FAMILY MEMBERS.AFTER EXPLAINING HHC SERVICES-INTERMITTENT,TEACHING PATIENT,& FAMILY HOW TO MANAGE @ HOME, PATIENT NOW IN AGREEMENT TO SNF.MD UPDATED.CSW CONS PLACED.TC [facility]-BARBARA TO P/U RW,& INFORMED THAT PATIENT NOW GOING TO SNF.  02/02/13 1100 Tymeeka Davis,RN,BSN [phone] UR updated. Cm previously attempted to contact family for dc planning, upon CM conversation with pt at bedside on 02/01/13 in which pt states disposition is home. No request for [facility] services. Pt request RW ONLY! Per MD, had conversation with family whom states pt is correct, pt to dc home with family providing care.  02/01/13 1105 Tymeeka Davis,RN,BSN  [phone] Cm spoke with patient at bedside concerning discharge planning. CM noted PT eval recommendations for SNF/24 hour supervision. Pt states choice of disposition is Home. Pt states lives home with adult daughter, adult son, and grandchildren. Pt states HH services not required due to assistance from multiple family members. Pt has tried to contact pt's daughter Sara Dennis to verify dc disposition without success. Pt request RW. MD sticky note entered to order dme prior to discharge.       01/30/13 1400 Tymeeka Davis,RN,BSN [phone] Pt admitted with AMS & UTI. PTA pt from home with adult son assisting with home care. Pt has HX of Dm & CHF. May require [facility] services upon discharge for disease mamangement.

## 2013-02-04 LAB — GLUCOSE, CAPILLARY
Glucose-Capillary: 164 mg/dL — ABNORMAL HIGH (ref 70–99)
Glucose-Capillary: 182 mg/dL — ABNORMAL HIGH (ref 70–99)

## 2013-02-04 MED ORDER — WARFARIN SODIUM 5 MG PO TABS
5.0000 mg | ORAL_TABLET | Freq: Once | ORAL | Status: AC
  Administered 2013-02-04: 5 mg via ORAL
  Filled 2013-02-04: qty 1

## 2013-02-04 NOTE — Progress Notes (Addendum)
Clinical Social Work Department CLINICAL SOCIAL WORK PLACEMENT NOTE 02/04/2013  Patient:  ZHAVIA, CUNANAN  Account Number:  [id number] Admit date:  01/30/2013  Clinical Social Worker:  Doroteo Glassman  Date/time:  02/04/2013 12:57 PM  Clinical Social Work is seeking post-discharge placement for this patient at the following level of care:   SKILLED NURSING   (*CSW will update this form in Epic as items are completed)   Declined--only want [facility]  Patient/family provided with [facility] Department of Clinical Social Work's list of facilities offering this level of care within the geographic area requested by the patient (or if unable, by the patient's family).  02/04/2013  Patient/family informed of their freedom to choose among providers that offer the needed level of care, that participate in Medicare, Medicaid or managed care program needed by the patient, have an available bed and are willing to accept the patient.  n/a  Patient/family informed of [facility]' ownership interest in [facility], as well as of the fact that they are under no obligation to receive care at this facility.  PASARR submitted to EDS on  PASARR number received from EDS on   FL2 transmitted to all facilities in geographic area requested by pt/family on  02/04/2013 FL2 transmitted to all facilities within larger geographic area on   Patient informed that his/her managed care company has contracts with or will negotiate with  certain facilities, including the following:     Patient/family informed of bed offers received:  02/05/2013 Patient chooses bed at [facility] Physician recommends and patient chooses bed at    Patient to be transferred to  on  [facility] on 02/05/2013 Patient to be transferred to facility by ambulance Sharin Mons)  The following physician request were entered in Epic:   Additional Comments:  Providence Crosby, Theresia Majors Clinical Social  Work [phone]  Jacklynn Lewis, MSW, [facility]  Clinical Social Work [phone]

## 2013-02-04 NOTE — Progress Notes (Signed)
Clinical Social Work Department BRIEF PSYCHOSOCIAL ASSESSMENT 02/04/2013  Patient:  TANAYSHA, ALKINS     Account Number:  [id number]     Admit date:  01/30/2013  Clinical Social Worker:  Doroteo Glassman  Date/Time:  02/04/2013 12:53 PM  Referred by:  Physician  Date Referred:  02/04/2013 Referred for  SNF Placement   Other Referral:   Interview type:  Patient Other interview type:   Daughter and other family at bedside    PSYCHOSOCIAL DATA Living Status:  FAMILY Admitted from facility:   Level of care:   Primary support name:  [name] Primary support relationship to patient:  CHILD, ADULT Degree of support available:   strong    CURRENT CONCERNS Current Concerns  Post-Acute Placement   Other Concerns:    SOCIAL WORK ASSESSMENT / PLAN Met with Pt and family at bedside.    Pt stated that she will need SNF upon d/c and that her daughter has contacted [facility] and notified them that Pt will be ready for d/c tomorrow.  Pt's daughter confirmed this and stated that Aneta Mins at [facility] told her that Pt will have a bed ready tomorrow.    CSW answered SNF transfer questions.    CSW to begin SNF process for Pt's d/c tomorrow.    Weekday CSW to follow.    CSW thanked Pt and her family for their time.   Assessment/plan status:   Other assessment/ plan:   Information/referral to community resources:   n/a--wants [facility]    PATIENT'S/FAMILY'S RESPONSE TO PLAN OF CARE: Pt and family thanked CSW for time and assistance.   Providence Crosby, LCSWA Clinical Social Work [phone]

## 2013-02-04 NOTE — Progress Notes (Signed)
ANTICOAGULATION CONSULT NOTE - Follow Up  Pharmacy Consult for warfarin Indication: hx PE  Allergies  Allergen Reactions  . Iohexol   . Ivp Dye (Iodinated Diagnostic Agents)     itching  . Bactrim Itching and Rash    Patient Measurements: Height: 5\' 5"  (165.1 cm) Weight: 243 lb 13.3 oz (110.6 kg) IBW/kg (Calculated) : 57  Vital Signs: Temp: 98.3 F (36.8 C) (05/11 0402) Temp src: Oral (05/11 0402) BP: 108/50 mmHg (05/11 0402) Pulse Rate: 71 (05/11 0402)  Labs:  Recent Labs  02/02/13 0443 02/03/13 0451 02/04/13 0429  HGB 11.6*  --   --   HCT 35.3*  --   --   PLT 282  --   --   LABPROT 19.6* 24.1* 28.2*  INR 1.72* 2.28* 2.82*  CREATININE 1.12*  --   --     Estimated Creatinine Clearance: 43 ml/min (by C-G formula based on Cr of 1.12).  Medications:  Prescriptions prior to admission  Medication Sig Dispense Refill  . acetaminophen (TYLENOL) 500 MG tablet Take 500 mg by mouth every 6 (six) hours as needed. PAIN        . aspirin 325 MG tablet Take 325 mg by mouth every morning.        . beta carotene w/minerals (OCUVITE) tablet Take 1 tablet by mouth daily.        Marland Kitchen donepezil (ARICEPT) 5 MG tablet Take 5 mg by mouth every morning.       . furosemide (LASIX) 40 MG tablet Take 40 mg by mouth every morning.       Marland Kitchen glipiZIDE (GLUCOTROL) 10 MG tablet Take 10 mg by mouth 2 (two) times daily before a meal.      . insulin glargine (LANTUS) 100 UNIT/ML injection Inject 35 Units into the skin 2 (two) times daily.       Marland Kitchen levothyroxine (SYNTHROID, LEVOTHROID) 50 MCG tablet Take 50 mcg by mouth daily before breakfast.      . meloxicam (MOBIC) 15 MG tablet Take 15 mg by mouth daily.      . metFORMIN (GLUCOPHAGE) 500 MG tablet Take 500 mg by mouth every evening.        . potassium chloride (KLOR-CON) 20 MEQ packet Take 20 mEq by mouth daily.       Marland Kitchen trimethoprim (TRIMPEX) 100 MG tablet Take 100 mg by mouth every morning.       . warfarin (COUMADIN) 5 MG tablet Take 7.5-10 mg  by mouth every evening. 10mg  on Monday, Wednesday, AND Friday.  7.5mg  ON Sunday, Tuesday, Thursday, Saturday.        Assessment: 71 yof w/ hx PE admitted 5/6 w/ fever, AMS, and UTI. Pt is on chronic coumadin for hx PE, pharmacy to dose while inpt  Dose PTA 10mg  MWF, 7.5mg  other days. Also on full dose aspirin daily.  INR therapeutic and rising 2.82 after boosted doses on admission  No bleeding reported, CBC on 5/9 stable.  Now on day #3 levaquin which may potentiate warfarin effects and increase INR. Also, home mobic resumed.  Goal of Therapy:  INR 2-3   Plan:   Warfarin 5mg  today  Daily PT/INR   Loralee Pacas, PharmD, BCPS Pager: [phone]/07/2013 7:34 AM

## 2013-02-04 NOTE — Progress Notes (Signed)
Patient ID: Sara Dennis, female   DOB: 06-01-1922, 79 y.o.   MRN: [id number]  [facility] PROGRESS NOTE  Sara Dennis MVH:[id number] DOB: 1922/02/12 DOA: 01/30/2013 PCP: Theodoro Kos, MD  Brief narrative: Pt admitted with AMS and history was not obtained form pt as a result. Pt apparently had 2-3 days duration of progressively worsening shortness of breath, associated with productive cough of yellow sputum, fevers, chills, poor oral intake and urinary urgency and frequency. [facility] asked to admit for UTI and CAP treatment.  Patient seen and examined today. No changes in plan, d/c to SNF tomorrow morning.   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 Top differential considerations include interstitial edema with atelectasis and viral / atypical respiratory infection.    Antibiotics:  Rocephin 05/05 -->5/9  Zithromax 05/05 -->5/9  Levaquin 5/9 >>  Code Status: DNR Family Communication: Pt and daughter at bedside Disposition Plan: SNF Monday  HPI/Subjective: No events overnight.   Objective: Filed Vitals:   02/03/13 0406 02/03/13 1451 02/03/13 1959 02/04/13 0402  BP: 138/66 108/57 120/44 108/50  Pulse: 82 62 68 71  Temp: 98.6 F (37 C) 97.8 F (36.6 C) 98 F (36.7 C) 98.3 F (36.8 C)  TempSrc: Oral Oral  Oral Oral  Resp: 18 18 19 22   Height:      Weight:      SpO2: 95% 94% 100% 98%    Intake/Output Summary (Last 24 hours) at 02/04/13 0825 Last data filed at 02/03/13 1959  Gross per 24 hour  Intake    840 ml  Output      0 ml  Net    840 ml    Exam:   General:  Pt is alert, follows commands appropriately, not in acute distress  Cardiovascular: Regular rate and rhythm, S1/S2, no murmurs, no rubs, no gallops  Respiratory: Clear to auscultation bilaterally, no wheezing, no crackles, no rhonchi  Abdomen: Soft, non tender, non distended, bowel sounds present, no guarding  Extremities: No edema, pulses DP and PT palpable bilaterally; right ankle pain on medial aspect with palpation  Neuro: Grossly nonfocal  Data Reviewed: Basic Metabolic Panel:  Recent Labs Lab 01/30/13 0239 01/31/13 0427 02/01/13 0506 02/02/13 0443  NA 139 137 136 136  K 5.0 4.1 4.2 4.1  CL 103 102 101 100  CO2 25 27 26 28   GLUCOSE 211* 159* 164* 141*  BUN 30* 20 22 19   CREATININE 1.47* 1.24* 1.30* 1.12*  CALCIUM 9.2 9.3 9.3 9.4   CBC:  Recent Labs Lab 01/30/13 0239 01/31/13 0427 02/01/13 0506 02/02/13 0443  WBC 23.4* 17.2* 15.2* 15.4*  NEUTROABS 20.8*  --   --   --   HGB 12.3 11.6* 11.8* 11.6*  HCT 36.9 35.3* 35.2* 35.3*  MCV 77.4* 78.3 78.0 77.4*  PLT 292 259 256 282   CBG:  Recent  Labs Lab 02/03/13 1654 02/03/13 1955 02/04/13 0001 02/04/13 0358 02/04/13 0727  GLUCAP 160* 236* 176* 129* 127*    Recent Results (from the past 240 hour(s))  URINE CULTURE     Status: None   Collection Time    01/30/13  2:41 AM      Result Value Range Status   Specimen Description URINE, CATHETERIZED   Final   Special Requests NONE   Final   Culture  Setup Time 01/30/2013 10:20   Final   Colony Count >=100,000 COLONIES/ML   Final   Culture     Final   Value: ENTEROBACTER CLOACAE     Note: Two isolates with different morphologies were identified as the same organism.The most resistant  organism was reported.   Report Status 02/02/2013 FINAL   Final   Organism ID, Bacteria ENTEROBACTER CLOACAE   Final  CULTURE, BLOOD (ROUTINE X 2)     Status: None   Collection Time    01/30/13  3:25 AM      Result Value Range Status   Specimen Description BLOOD LEFT WRIST   Final   Special Requests BOTTLES DRAWN AEROBIC AND ANAEROBIC [facility] EACH   Final   Culture  Setup Time 01/30/2013 10:28   Final   Culture     Final   Value:        BLOOD CULTURE RECEIVED NO GROWTH TO DATE CULTURE [name]   Report Status PENDING   Incomplete  CULTURE, BLOOD (ROUTINE X 2)     Status: None   Collection Time    01/30/13  3:27 AM      Result Value Range Status   Specimen Description BLOOD RIGHT WRIST   Final   Special Requests BOTTLES DRAWN AEROBIC AND ANAEROBIC 5CC EACH   Final   Culture  Setup Time 01/30/2013 10:28   Final   Culture     Final   Value:        BLOOD CULTURE RECEIVED NO GROWTH TO DATE CULTURE [name]   Report Status PENDING   Incomplete     Scheduled Meds: . aspirin  325 mg Oral Daily  . donepezil  5 mg Oral Daily  . furosemide  40 mg Oral Daily  . insulin aspart  0-15 Units Subcutaneous Q4H  . insulin glargine  30 Units Subcutaneous BID  . levofloxacin  500 mg Oral Daily  . levothyroxine  50 mcg Oral QAC breakfast  . meloxicam  15 mg Oral Daily  . pantoprazole  40 mg Oral Daily  . potassium chloride SA  20 mEq Oral Daily  . warfarin  5 mg Oral ONCE-1800  . Warfarin - Pharmacist Dosing Inpatient   Does not apply q1800   Continuous Infusions:   Pamella Pert, MD  [facility] Pager [phone]  If 7PM-7AM, please contact night-coverage [email or web address] Password [email or web address] 02/04/2013, 8:25 AM   LOS: 5 days

## 2013-02-05 LAB — CULTURE, BLOOD (ROUTINE X 2): Culture: NO GROWTH

## 2013-02-05 LAB — GLUCOSE, CAPILLARY: Glucose-Capillary: 109 mg/dL — ABNORMAL HIGH (ref 70–99)

## 2013-02-05 NOTE — Progress Notes (Signed)
ANTICOAGULATION CONSULT NOTE - Follow Up  Pharmacy Consult for warfarin Indication: hx PE  Allergies  Allergen Reactions  . Iohexol   . Ivp Dye (Iodinated Diagnostic Agents)     itching  . Bactrim Itching and Rash    Patient Measurements: Height: 5\' 5"  (165.1 cm) Weight: 243 lb 13.3 oz (110.6 kg) IBW/kg (Calculated) : 57  Vital Signs: Temp: 98.3 F (36.8 C) (05/12 0401) Temp src: Oral (05/12 0401) BP: 104/47 mmHg (05/12 0401) Pulse Rate: 64 (05/12 0401)  Labs:  Recent Labs  02/03/13 0451 02/04/13 0429 02/05/13 0419  LABPROT 24.1* 28.2* 30.4*  INR 2.28* 2.82* 3.12*    Estimated Creatinine Clearance: 43 ml/min (by C-G formula based on Cr of 1.12).  Medications:  Prescriptions prior to admission  Medication Sig Dispense Refill  . acetaminophen (TYLENOL) 500 MG tablet Take 500 mg by mouth every 6 (six) hours as needed. PAIN        . aspirin 325 MG tablet Take 325 mg by mouth every morning.        . beta carotene w/minerals (OCUVITE) tablet Take 1 tablet by mouth daily.        Marland Kitchen donepezil (ARICEPT) 5 MG tablet Take 5 mg by mouth every morning.       . furosemide (LASIX) 40 MG tablet Take 40 mg by mouth every morning.       Marland Kitchen glipiZIDE (GLUCOTROL) 10 MG tablet Take 10 mg by mouth 2 (two) times daily before a meal.      . insulin glargine (LANTUS) 100 UNIT/ML injection Inject 35 Units into the skin 2 (two) times daily.       Marland Kitchen levothyroxine (SYNTHROID, LEVOTHROID) 50 MCG tablet Take 50 mcg by mouth daily before breakfast.      . meloxicam (MOBIC) 15 MG tablet Take 15 mg by mouth daily.      . metFORMIN (GLUCOPHAGE) 500 MG tablet Take 500 mg by mouth every evening.        . potassium chloride (KLOR-CON) 20 MEQ packet Take 20 mEq by mouth daily.       Marland Kitchen trimethoprim (TRIMPEX) 100 MG tablet Take 100 mg by mouth every morning.       . warfarin (COUMADIN) 5 MG tablet Take 7.5-10 mg by mouth every evening. 10mg  on Monday, Wednesday, AND Friday.  7.5mg  ON Sunday, Tuesday,  Thursday, Saturday.        Assessment: 68 yof w/ hx PE admitted 5/6 w/ fever, AMS, and UTI. Pt is on chronic coumadin for hx PE, pharmacy to dose while inpt  Dose PTA 10mg  MWF, 7.5mg  other days. Also on full dose aspirin daily.  INR supratherapeutic, still rising  Doses inpt: 10, 10, 12.5, 12.5, 7.5, 5 mg 5/5-5/11  No bleeding reported, CBC on 5/9 stable.  Now on day #3 levaquin may increase INR.   Goal of Therapy:  INR 2-3   Plan:   No warfarin today  Daily PT/INR  Gwen Her PharmD  [phone] 02/05/2013 8:06 AM

## 2013-02-05 NOTE — Progress Notes (Signed)
Patient discharged to [facility] via ambulance, discharge paperwork sent with patient. Report called to RN at [facility].

## 2013-02-05 NOTE — Progress Notes (Signed)
Pt for discharge to [facility].  CSW facilitated pt discharge needs including contacting facility, faxing pt discharge information via [facility], discussing with pt at bedside, providing RN phone number to call report, and arranging ambulance transportation for pt to [facility].  No further social work needs identified at this time.  CSW signing off.   Jacklynn Lewis, MSW, LCSWA  Clinical Social Work [phone]

## 2013-02-05 NOTE — Progress Notes (Signed)
Physical Therapy Treatment Patient Details Name: Sara Dennis MRN: [id number] DOB: 24-Dec-1921 Today's Date: 02/05/2013 Time: 1045-1100 PT Time Calculation (min): 15 min  PT Assessment / Plan / Recommendation Comments on Treatment Session  Pt ambulated in hall, continues with pain in feet. Plans for DC to snf today.     Follow Up Recommendations  SNF;Supervision/Assistance - 24 hour     Does the patient have the potential to tolerate intense rehabilitation     Barriers to Discharge        Equipment Recommendations  Rolling walker with 5" wheels    Recommendations for Other Services    Frequency Min 3X/week   Plan Discharge plan remains appropriate    Precautions / Restrictions Precautions Precautions: Fall Precaution Comments: incontinent Restrictions Weight Bearing Restrictions: No   Pertinent Vitals/Pain *both feet painful and limit gait.   Mobility  Bed Mobility Supine to Sit: 7: Independent Transfers Sit to Stand: 4: Min assist;From bed;With upper extremity assist Stand to Sit: To chair/3-in-1;With armrests;4: Min guard Details for Transfer Assistance: cues to push from bed and reach to recliner, cues for safety for getting in front of recliner prior to sitting. Ambulation/Gait Ambulation/Gait Assistance: 3: Mod assist, cues for posture and position inside RW Ambulation Distance (Feet): 25 Feet Assistive device: Rolling walker Gait Pattern: Trunk flexed;Step-through pattern;Decreased step length - right;Decreased step length - left;Antalgic    Exercises General Exercises - Lower Extremity Ankle Circles/Pumps: AROM;Both;20 reps;Seated Long Arc Quad: AROM;Both;20 reps;Seated Hip Flexion/Marching: AROM;Both;20 reps;Seated   PT Diagnosis:    PT Problem List:   PT Treatment Interventions:     PT Goals Acute Rehab PT Goals Pt will go Supine/Side to Sit: with modified independence PT Goal: Supine/Side to Sit - Progress: Met Pt will go Sit to Stand: with  modified independence PT Goal: Sit to Stand - Progress: Progressing toward goal Pt will go Stand to Sit: with modified independence PT Goal: Stand to Sit - Progress: Progressing toward goal Pt will Ambulate: 51 - 150 feet;with modified independence;with least restrictive assistive device PT Goal: Ambulate - Progress: Progressing toward goal Pt will Perform Home Exercise Program: with supervision, verbal cues required/provided PT Goal: Perform Home Exercise Program - Progress: Progressing toward goal  Visit Information  Last PT Received On: 02/05/13 Assistance Needed: +1    Subjective Data  Subjective: My feet still hurt, but I will walk so I can leave. I am going today   Cognition  Cognition Arousal/Alertness: Awake/alert    Balance     End of Session PT - End of Session Activity Tolerance: Patient limited by pain Patient left: in chair;with call bell/phone within reach;with family/visitor present   GP     Rada Hay 02/05/2013, 11:09 AM

## 2013-02-07 ENCOUNTER — Non-Acute Institutional Stay (SKILLED_NURSING_FACILITY): Payer: Medicare Other | Admitting: Internal Medicine

## 2013-02-07 DIAGNOSIS — I509 Heart failure, unspecified: Secondary | ICD-10-CM

## 2013-02-07 DIAGNOSIS — N182 Chronic kidney disease, stage 2 (mild): Secondary | ICD-10-CM

## 2013-02-07 DIAGNOSIS — J189 Pneumonia, unspecified organism: Secondary | ICD-10-CM

## 2013-02-07 DIAGNOSIS — N390 Urinary tract infection, site not specified: Secondary | ICD-10-CM

## 2013-02-07 DIAGNOSIS — I5022 Chronic systolic (congestive) heart failure: Secondary | ICD-10-CM

## 2013-02-08 ENCOUNTER — Non-Acute Institutional Stay (SKILLED_NURSING_FACILITY): Payer: Medicare Other | Admitting: Adult Health

## 2013-02-08 ENCOUNTER — Encounter: Payer: Self-pay | Admitting: Adult Health

## 2013-02-08 DIAGNOSIS — E118 Type 2 diabetes mellitus with unspecified complications: Secondary | ICD-10-CM

## 2013-02-08 DIAGNOSIS — I951 Orthostatic hypotension: Secondary | ICD-10-CM

## 2013-02-08 NOTE — Progress Notes (Signed)
Patient ID: Sara Dennis, female   DOB: Nov 17, 1921, 79 y.o.   MRN: [id number]  FACILITY: [facility]   Allergies  Allergen Reactions  . Iohexol   . Ivp Dye (Iodinated Diagnostic Agents)     itching  . Bactrim Itching and Rash    Chief Complaint  Patient presents with  . Acute Visit     "feels bad"    HPI:  She is feeling bad all over; she is complaining that she is dizzy when she is getting up; she is also complaining of nausea; she does not understand that her medications could be making her sick. She has recently been hospitalized for uti and pneumonia. Her cbg's have been running low some less than 100; she is taking glipizide; metformin; and lantus; will have her come off the po medications for her diabetes.   Past Medical History  Diagnosis Date  . Diabetes mellitus   . Shortness of breath     with activity  . CHF (congestive heart failure) may 2012  . Pulmonary embolus 03-23-11  . Arthritis     left leg  . UTI (lower urinary tract infection)     Past Surgical History  Procedure Laterality Date  . Kisney stone removal  1978    removed thru bladder  . Cystoscopy w/ ureteral stent placement  09/06/2011    Procedure: CYSTOSCOPY WITH RETROGRADE PYELOGRAM/URETERAL STENT PLACEMENT;  Surgeon: Valetta Fuller, MD;  Location: [facility];  Service: Urology;  Laterality: Right;  cysto double j stent placement  . Ureteroscopy  09/06/2011    Procedure: URETEROSCOPY;  Surgeon: Valetta Fuller, MD;  Location: [facility];  Service: Urology;  Laterality: Right;    VITAL SIGNS BP 100/60  Pulse 60  Ht 5\' 5"  (1.651 m)  Wt 242 lb (109.77 kg)  BMI 40.27 kg/m2   Patient's Medications  New Prescriptions   No medications on file  Previous Medications   ACETAMINOPHEN (TYLENOL) 500 MG TABLET    Take 500 mg by mouth every 6 (six) hours as needed. PAIN     ASPIRIN 325 MG TABLET    Take 325 mg by mouth every morning.     BETA CAROTENE W/MINERALS (OCUVITE) TABLET    Take 1 tablet by mouth  daily.     DONEPEZIL (ARICEPT) 5 MG TABLET    Take 5 mg by mouth every morning.    FUROSEMIDE (LASIX) 40 MG TABLET    Take 40 mg by mouth every morning.    GLIPIZIDE (GLUCOTROL) 10 MG TABLET    Take 10 mg by mouth 2 (two) times daily before a meal.   INSULIN GLARGINE (LANTUS) 100 UNIT/ML INJECTION    Inject 35 Units into the skin 2 (two) times daily.    LEVOTHYROXINE (SYNTHROID, LEVOTHROID) 50 MCG TABLET    Take 50 mcg by mouth daily before breakfast.   MELOXICAM (MOBIC) 15 MG TABLET    Take 15 mg by mouth daily.   METFORMIN (GLUCOPHAGE) 500 MG TABLET    Take 500 mg by mouth every evening.     POTASSIUM CHLORIDE (KLOR-CON) 20 MEQ PACKET    Take 20 mEq by mouth daily.    TRIMETHOPRIM (TRIMPEX) 100 MG TABLET    Take 100 mg by mouth every morning.    WARFARIN (COUMADIN) 5 MG TABLET    Take 7.5-10 mg by mouth every evening. 10mg  on Monday, Wednesday, AND Friday.  7.5mg  ON Sunday, Tuesday, Thursday, Saturday.  Modified Medications   Modified Medication Previous  Medication   LEVOFLOXACIN (LEVAQUIN) 500 MG TABLET levofloxacin (LEVAQUIN) 500 MG tablet      Take 500 mg by mouth daily.    Take 1 tablet (500 mg total) by mouth daily.  Discontinued Medications   No medications on file    SIGNIFICANT DIAGNOSTIC EXAMS   01-31-13: 2-d echo: Left ventricle: The cavity size was normal. Wall thickness was increased in a pattern of moderate LVH. There was mild focal basal hypertrophy of the septum. Systolic function was normal. The estimated ejection fraction was in the range of 55% to 60%.       Component Value Date/Time   BUN 19 02/02/2013 0443   GLUCOSE 141* 02/02/2013 0443   CREATININE 1.12* 02/02/2013 0443   K 4.1 02/02/2013 0443   NA 136 02/02/2013 0443      Component Value Date/Time   WBC 15.4* 02/02/2013 0443   RBC 4.56 02/02/2013 0443   HGB 11.6* 02/02/2013 0443   HCT 35.3* 02/02/2013 0443   PLT 282 02/02/2013 0443   MCV 77.4* 02/02/2013 0443     Review of Systems  Constitutional: Positive for  malaise/fatigue.  Respiratory: Negative for cough and shortness of breath.   Cardiovascular: Negative for chest pain and leg swelling.  Gastrointestinal: Positive for nausea and vomiting. Negative for abdominal pain, diarrhea and constipation.  Musculoskeletal: Negative for myalgias and joint pain.  Skin: Negative.   Neurological: Positive for dizziness. Negative for headaches.  Psychiatric/Behavioral: Negative for memory loss. The patient is nervous/anxious.     Physical Exam  Constitutional: She appears well-developed and well-nourished.  Obese   Neck: Neck supple.  Cardiovascular: Normal rate, regular rhythm and intact distal pulses.   Respiratory: Effort normal and breath sounds normal.  GI: Soft. Bowel sounds are normal. She exhibits no distension and no mass. There is no tenderness.  Musculoskeletal: Normal range of motion. She exhibits no edema.  Neurological: She is alert. She displays normal reflexes.  Oriented to self  Skin: Skin is warm and dry.  Psychiatric: She has a normal mood and affect.      ASSESSMENT/ PLAN:  Will stop the glipizide and metformin at this time; will have the nursing staff perform orthostatic vital signs twice daily; will continue to check cbg four times daily without ssi; will continue her lantus; will continue to monitor her status

## 2013-03-01 NOTE — Progress Notes (Signed)
Patient ID: Sara Dennis, female   DOB: Jul 26, 1922, 79 y.o.   MRN: [id number]        HISTORY & PHYSICAL  DATE: 02/07/2013   FACILITY: [facility]  LEVEL OF CARE: SNF (31)  ALLERGIES:  Allergies  Allergen Reactions  . Iohexol   . Ivp Dye (Iodinated Diagnostic Agents)     itching  . Bactrim Itching and Rash    CHIEF COMPLAINT:  Manage pneumonia, UTI, and CHF.    HISTORY OF PRESENT ILLNESS:  The patient is an 79 year-old, African-American female.    UTI:  At admission to the hospital, patient was having urinary urgency and frequency.  Urine culture grew Enterobacter cloacae.  The patient was initially treated with ceftriaxone, then transitioned to Levaquin based on culture sensitivities.  The UTI remains stable.  The patient denies ongoing suprapubic pain, flank pain, dysuria, urinary frequency, urinary hesitancy or hematuria.  No complications reported from the current antibiotic being used.    PNEUMONIA: The pneumonia remains stable.  The patient denies ongoing chest pain, cough, shortness of breath, fever, chills or night sweats. No complications reported from the current antibiotic being used.   CHF:The patient does not relate significant weight changes, denies sob, DOE, orthopnea, PNDs, pedal edema, palpitations or chest pain.  CHF remains stable.  No complications form the medications being used.   PAST MEDICAL HISTORY :  Past Medical History  Diagnosis Date  . Diabetes mellitus   . Shortness of breath     with activity  . CHF (congestive heart failure) may 2012  . Pulmonary embolus 03-23-11  . Arthritis     left leg  . UTI (lower urinary tract infection)     PAST SURGICAL HISTORY: Past Surgical History  Procedure Laterality Date  . Kisney stone removal  1978    removed thru bladder  . Cystoscopy w/ ureteral stent placement  09/06/2011    Procedure: CYSTOSCOPY WITH RETROGRADE PYELOGRAM/URETERAL STENT PLACEMENT;  Surgeon: Valetta Fuller, MD;   Location: [facility];  Service: Urology;  Laterality: Right;  cysto double j stent placement  . Ureteroscopy  09/06/2011    Procedure: URETEROSCOPY;  Surgeon: Valetta Fuller, MD;  Location: [facility];  Service: Urology;  Laterality: Right;    SOCIAL HISTORY:  reports that she has never smoked. She has never used smokeless tobacco. She reports that she does not drink alcohol or use illicit drugs.  FAMILY HISTORY: None  CURRENT MEDICATIONS: Reviewed per MAR  REVIEW OF SYSTEMS:  See HPI otherwise 14 point ROS is negative.  PHYSICAL EXAMINATION  VS:  T 96.8       P 70      RR 18    BP 120/60       POX%        WT (Lb) 242  GENERAL: no acute distress, moderately obese body habitus EYES: conjunctivae normal, sclerae normal, normal eye lids MOUTH/THROAT: lips without lesions,no lesions in the mouth,tongue is without lesions,uvula elevates in midline NECK: supple, trachea midline, no neck masses, no thyroid tenderness, no thyromegaly LYMPHATICS: no LAN in the neck, no supraclavicular LAN RESPIRATORY: breathing is even & unlabored, BS CTAB CARDIAC: RRR, no murmur,no extra heart sounds, no edema GI:  ABDOMEN: abdomen soft, normal BS, no masses, no tenderness  LIVER/SPLEEN: no hepatomegaly, no splenomegaly MUSCULOSKELETAL: HEAD: normal to inspection & palpation BACK: no kyphosis, scoliosis or spinal processes tenderness EXTREMITIES: LEFT UPPER EXTREMITY: full range of motion, normal strength & tone RIGHT  UPPER EXTREMITY:  full range of motion, normal strength & tone LEFT LOWER EXTREMITY:  full range of motion, normal strength & tone RIGHT LOWER EXTREMITY:  full range of motion, normal strength & tone PSYCHIATRIC: the patient is alert & oriented to person, affect & behavior appropriate  LABS/RADIOLOGY: 2D-echo:  Showed normal systolic function, EF 55-60%.    Right ankle x-ray:  No acute findings.   Chest x-ray:  Increased interstitial opacity and confluent lung base opacity.    Blood culture  x2 showed no growth.    Glucose 141, creatinine 1.12, otherwise BMP normal.    WBC 15.4, hemoglobin 11.6, MCV 77.4, otherwise CBC normal.      ASSESSMENT/PLAN:  Pneumonia.  Continue Levaquin.    UTI.  Continue Levaquin as prescribed.    CHF.  Well compensated.    Chronic kidney disease stage II.  Reassess renal functions.   Leukocytosis.  Likely secondary to UTI and pneumonia.  Reassess.   Anemia of chronic kidney disease.  Reassess.    Diabetes mellitus with renal complications.  Continue current medications.    Hypothyroidism.  Continue levothyroxine.    Check CBC and BMP.   I have reviewed patient's medical records received at admission/from hospitalization.  CPT CODE: 65784

## 2013-05-03 ENCOUNTER — Emergency Department (HOSPITAL_COMMUNITY): Payer: Medicare Other

## 2013-05-03 ENCOUNTER — Encounter (HOSPITAL_COMMUNITY): Payer: Self-pay | Admitting: Neurology

## 2013-05-03 ENCOUNTER — Inpatient Hospital Stay (HOSPITAL_COMMUNITY)
Admission: EM | Admit: 2013-05-03 | Discharge: 2013-05-07 | DRG: 871 | Disposition: A | Discharge status: Home / Self Care | Payer: Medicare Other | Attending: Internal Medicine | Admitting: Internal Medicine

## 2013-05-03 DIAGNOSIS — I5033 Acute on chronic diastolic (congestive) heart failure: Secondary | ICD-10-CM

## 2013-05-03 DIAGNOSIS — Z794 Long term (current) use of insulin: Secondary | ICD-10-CM

## 2013-05-03 DIAGNOSIS — J189 Pneumonia, unspecified organism: Secondary | ICD-10-CM

## 2013-05-03 DIAGNOSIS — E119 Type 2 diabetes mellitus without complications: Secondary | ICD-10-CM | POA: Diagnosis present

## 2013-05-03 DIAGNOSIS — I509 Heart failure, unspecified: Secondary | ICD-10-CM | POA: Diagnosis present

## 2013-05-03 DIAGNOSIS — Z7901 Long term (current) use of anticoagulants: Secondary | ICD-10-CM

## 2013-05-03 DIAGNOSIS — N201 Calculus of ureter: Secondary | ICD-10-CM

## 2013-05-03 DIAGNOSIS — N183 Chronic kidney disease, stage 3 unspecified: Secondary | ICD-10-CM | POA: Diagnosis present

## 2013-05-03 DIAGNOSIS — G9340 Encephalopathy, unspecified: Secondary | ICD-10-CM

## 2013-05-03 DIAGNOSIS — A419 Sepsis, unspecified organism: Principal | ICD-10-CM | POA: Diagnosis present

## 2013-05-03 DIAGNOSIS — I129 Hypertensive chronic kidney disease with stage 1 through stage 4 chronic kidney disease, or unspecified chronic kidney disease: Secondary | ICD-10-CM | POA: Diagnosis present

## 2013-05-03 DIAGNOSIS — Z66 Do not resuscitate: Secondary | ICD-10-CM | POA: Diagnosis present

## 2013-05-03 DIAGNOSIS — I959 Hypotension, unspecified: Secondary | ICD-10-CM | POA: Diagnosis present

## 2013-05-03 DIAGNOSIS — J8410 Pulmonary fibrosis, unspecified: Secondary | ICD-10-CM | POA: Diagnosis present

## 2013-05-03 DIAGNOSIS — Z86711 Personal history of pulmonary embolism: Secondary | ICD-10-CM

## 2013-05-03 DIAGNOSIS — N390 Urinary tract infection, site not specified: Secondary | ICD-10-CM | POA: Diagnosis present

## 2013-05-03 DIAGNOSIS — D509 Iron deficiency anemia, unspecified: Secondary | ICD-10-CM | POA: Diagnosis not present

## 2013-05-03 DIAGNOSIS — I4892 Unspecified atrial flutter: Secondary | ICD-10-CM | POA: Diagnosis not present

## 2013-05-03 DIAGNOSIS — IMO0001 Reserved for inherently not codable concepts without codable children: Secondary | ICD-10-CM | POA: Diagnosis present

## 2013-05-03 DIAGNOSIS — Z79899 Other long term (current) drug therapy: Secondary | ICD-10-CM

## 2013-05-03 DIAGNOSIS — Z7982 Long term (current) use of aspirin: Secondary | ICD-10-CM

## 2013-05-03 DIAGNOSIS — D72829 Elevated white blood cell count, unspecified: Secondary | ICD-10-CM | POA: Diagnosis present

## 2013-05-03 DIAGNOSIS — B9689 Other specified bacterial agents as the cause of diseases classified elsewhere: Secondary | ICD-10-CM | POA: Diagnosis present

## 2013-05-03 DIAGNOSIS — E039 Hypothyroidism, unspecified: Secondary | ICD-10-CM | POA: Diagnosis present

## 2013-05-03 LAB — URINALYSIS, ROUTINE W REFLEX MICROSCOPIC
Bilirubin Urine: NEGATIVE
Glucose, UA: NEGATIVE mg/dL
Ketones, ur: NEGATIVE mg/dL
Protein, ur: NEGATIVE mg/dL
pH: 5.5 (ref 5.0–8.0)

## 2013-05-03 LAB — PROTIME-INR: INR: 1.72 — ABNORMAL HIGH (ref 0.00–1.49)

## 2013-05-03 LAB — URINE MICROSCOPIC-ADD ON

## 2013-05-03 LAB — CBC WITH DIFFERENTIAL/PLATELET
Basophils Relative: 0 % (ref 0–1)
Eosinophils Absolute: 0.2 10*3/uL (ref 0.0–0.7)
HCT: 39.2 % (ref 36.0–46.0)
Lymphs Abs: 1.5 10*3/uL (ref 0.7–4.0)
MCV: 77.5 fL — ABNORMAL LOW (ref 78.0–100.0)
Monocytes Relative: 6 % (ref 3–12)
Neutro Abs: 13.2 10*3/uL — ABNORMAL HIGH (ref 1.7–7.7)
RDW: 16.3 % — ABNORMAL HIGH (ref 11.5–15.5)
WBC: 15.8 10*3/uL — ABNORMAL HIGH (ref 4.0–10.5)

## 2013-05-03 LAB — BASIC METABOLIC PANEL
Chloride: 101 mEq/L (ref 96–112)
GFR calc Af Amer: 39 mL/min — ABNORMAL LOW (ref >90)
GFR calc non Af Amer: 34 mL/min — ABNORMAL LOW (ref >90)
Potassium: 4.5 mEq/L (ref 3.5–5.1)
Sodium: 140 mEq/L (ref 135–145)

## 2013-05-03 LAB — POCT I-STAT TROPONIN I

## 2013-05-03 MED ORDER — TECHNETIUM TC 99M DIMERCAPTOSUCCINIC ACID: 40.0000 | Freq: Once | INTRAVENOUS | Status: AC | PRN

## 2013-05-03 MED ORDER — TECHNETIUM TO 99M ALBUMIN AGGREGATED
6.0000 | Freq: Once | INTRAVENOUS | Status: AC | PRN
  Administered 2013-05-03: 6 via INTRAVENOUS

## 2013-05-03 MED ORDER — ASPIRIN 81 MG PO CHEW: 324.0000 mg | CHEWABLE_TABLET | Freq: Once | ORAL | Status: DC

## 2013-05-03 MED ORDER — DEXTROSE 5 % IV SOLN
1.0000 g | Freq: Once | INTRAVENOUS | Status: AC
  Administered 2013-05-03: 1 g via INTRAVENOUS
  Filled 2013-05-03: qty 10

## 2013-05-03 MED ORDER — ACETAMINOPHEN 500 MG PO TABS: 1000.0000 mg | ORAL_TABLET | Freq: Once | ORAL | Status: DC

## 2013-05-03 MED ORDER — ONDANSETRON HCL 4 MG/2ML IJ SOLN
4.0000 mg | Freq: Once | INTRAMUSCULAR | Status: AC
  Administered 2013-05-03: 4 mg via INTRAVENOUS

## 2013-05-03 MED ORDER — ONDANSETRON HCL 4 MG/2ML IJ SOLN
INTRAMUSCULAR | Status: AC
Start: 2013-05-03 — End: 2013-05-04
  Filled 2013-05-03: qty 2

## 2013-05-03 NOTE — ED Provider Notes (Signed)
CSN: [id number]     Arrival date & time 05/03/13  1440 History     First MD Initiated Contact with Patient 05/03/13 1458     Chief Complaint  Patient presents with  . Weakness   (Consider location/radiation/quality/duration/timing/severity/associated sxs/prior Treatment) HPI Comments: 79 year old female presents by EMS from home with acute shaking. She states that. Slight she has tremors that are different from chills. She also has about acutely diffusely weak and short of breath. This all started acutely about 30 minutes prior to arrival while just sitting down. She also vomited once upon arrival. She denies any current nausea. Denies any pain, including chest pain, headache, abdominal pain, back pain, and leg pain. Does not feel focally weak on one side or the other. Has a history of CHF and PE but does not feel like the shortness of breath is similar to either of those episodes. Denies any numbness.  The history is provided by the patient.    Past Medical History  Diagnosis Date  . Diabetes mellitus   . Shortness of breath     with activity  . CHF (congestive heart failure) may 2012  . Pulmonary embolus 03-23-11  . Arthritis     left leg  . UTI (lower urinary tract infection)    Past Surgical History  Procedure Laterality Date  . Kisney stone removal  1978    removed thru bladder  . Cystoscopy w/ ureteral stent placement  09/06/2011    Procedure: CYSTOSCOPY WITH RETROGRADE PYELOGRAM/URETERAL STENT PLACEMENT;  Surgeon: Valetta Fuller, MD;  Location: [facility];  Service: Urology;  Laterality: Right;  cysto double j stent placement  . Ureteroscopy  09/06/2011    Procedure: URETEROSCOPY;  Surgeon: Valetta Fuller, MD;  Location: [facility];  Service: Urology;  Laterality: Right;   No family history on file. History  Substance Use Topics  . Smoking status: Never Smoker   . Smokeless tobacco: Never Used  . Alcohol Use: No   OB History   Grav Para Term Preterm Abortions TAB SAB Ect Mult  Living                 Review of Systems  Constitutional: Negative for fever and chills.  Respiratory: Positive for shortness of breath. Negative for cough.   Cardiovascular: Negative for chest pain and leg swelling.  Gastrointestinal: Positive for vomiting. Negative for abdominal pain and diarrhea.  Genitourinary: Negative for dysuria.  Neurological: Positive for weakness. Negative for headaches.  All other systems reviewed and are negative.    Allergies  Iohexol; Ivp dye; and Bactrim  Home Medications   Current Outpatient Rx  Name  Route  Sig  Dispense  Refill  . acetaminophen (TYLENOL) 500 MG tablet   Oral   Take 500 mg by mouth every 6 (six) hours as needed. PAIN           . aspirin 325 MG tablet   Oral   Take 325 mg by mouth every morning.           . beta carotene w/minerals (OCUVITE) tablet   Oral   Take 1 tablet by mouth daily.           Marland Kitchen donepezil (ARICEPT) 5 MG tablet   Oral   Take 5 mg by mouth every morning.          . furosemide (LASIX) 40 MG tablet   Oral   Take 40 mg by mouth every morning.          Marland Kitchen  insulin glargine (LANTUS) 100 UNIT/ML injection   Subcutaneous   Inject 35 Units into the skin 2 (two) times daily.          Marland Kitchen levothyroxine (SYNTHROID, LEVOTHROID) 50 MCG tablet   Oral   Take 50 mcg by mouth daily before breakfast.         . meloxicam (MOBIC) 15 MG tablet   Oral   Take 15 mg by mouth daily.         . potassium chloride (KLOR-CON) 20 MEQ packet   Oral   Take 20 mEq by mouth daily.          Marland Kitchen trimethoprim (TRIMPEX) 100 MG tablet   Oral   Take 100 mg by mouth every morning.          . warfarin (COUMADIN) 5 MG tablet   Oral   Take 7.5-10 mg by mouth every evening. 10mg  on Monday, Wednesday, AND Friday.  7.5mg  ON Sunday, Tuesday, Thursday, Saturday.          BP 92/73  Pulse 78  Temp(Src) 98.3 F (36.8 C)  Resp 24  SpO2 95% Physical Exam  Vitals reviewed. Constitutional: She is oriented to  person, place, and time. She appears well-developed and well-nourished.  HENT:  Head: Normocephalic and atraumatic.  Right Ear: External ear normal.  Left Ear: External ear normal.  Nose: Nose normal.  Eyes: Right eye exhibits no discharge. Left eye exhibits no discharge.  Cardiovascular: Normal rate, regular rhythm and normal heart sounds.   Pulmonary/Chest: Effort normal. She has rales in the right lower field and the left lower field.  Mildly increased WOB  Abdominal: Soft. There is no tenderness.  Musculoskeletal: She exhibits edema (trace pitting edema bilateral LE).  Neurological: She is alert and oriented to person, place, and time.  Skin: Skin is warm and dry.    ED Course   Procedures (including critical care time)  Labs Reviewed  CBC WITH DIFFERENTIAL - Abnormal; Notable for the following:    WBC 15.8 (*)    MCV 77.5 (*)    RDW 16.3 (*)    Neutrophils Relative % 83 (*)    Neutro Abs 13.2 (*)    Lymphocytes Relative 9 (*)    All other components within normal limits  BASIC METABOLIC PANEL - Abnormal; Notable for the following:    Glucose, Bld 126 (*)    BUN 27 (*)    Creatinine, Ser 1.36 (*)    GFR calc non Af Amer 34 (*)    GFR calc Af Amer 39 (*)    All other components within normal limits  PROTIME-INR - Abnormal; Notable for the following:    Prothrombin Time 19.7 (*)    INR 1.72 (*)    All other components within normal limits  URINALYSIS, ROUTINE W REFLEX MICROSCOPIC - Abnormal; Notable for the following:    APPearance CLOUDY (*)    Nitrite POSITIVE (*)    Leukocytes, UA LARGE (*)    All other components within normal limits  URINE MICROSCOPIC-ADD ON - Abnormal; Notable for the following:    Bacteria, UA MANY (*)    Casts HYALINE CASTS (*)    All other components within normal limits  CG4 I-STAT (LACTIC ACID) - Abnormal; Notable for the following:    Lactic Acid, Venous 2.67 (*)    All other components within normal limits  URINE CULTURE  PRO B  NATRIURETIC PEPTIDE  POCT I-STAT TROPONIN I    Date: 05/03/2013  Rate: 76  Rhythm: normal sinus rhythm  QRS Axis: left  Intervals: normal  ST/T Wave abnormalities: nonspecific ST/T changes  Conduction Disutrbances:right bundle branch block  Narrative Interpretation: RBBB with no new ischemic changes  Old EKG Reviewed: unchanged   Dg Chest Portable 1 View  05/03/2013   *RADIOLOGY REPORT*  Clinical Data: Weakness  PORTABLE CHEST - 1 VIEW  Comparison: Jan 30, 2013  Findings: Degree of inspiration is shallow.  There is mild interstitial prominence which is probably due to mild edema superimposed on shallow inspiration.  Heart is borderline prominent with normal pulmonary vascularity.  No adenopathy.  There is arthropathy in the left shoulder, stable.  IMPRESSION: Suspect a degree of mild congestive heart failure, stable.  No consolidation.   Original Report Authenticated By: Bretta Bang, M.D.   1. UTI (urinary tract infection)     MDM  79 year old female presents from home with generalized weakness and dyspnea. These were relatively acute in onset. Has a UTI and was given Rocephin. After discussing with the hospitalists she was evaluated for a pulmonary embolism with a VQ scan as she cannot receive contrast. This was obtained due to her acute shortness of breath. VQ scan was of low probability. Due to her elevated white count and lactic acid with a UTI we will bring her in for supportive care and antibiotics.      Audree Camel, MD 05/03/13 [phone]

## 2013-05-03 NOTE — ED Notes (Addendum)
Per EMS- Pt comes from home had sudden onset weakness and SOB. Pt episode of emesis upon arrival. Denies any cp. Appears SOB BP 142/59, HR 60, 96% RA, CBG 125. Pt is alert and oriented. Reported hx PE.

## 2013-05-04 ENCOUNTER — Encounter (HOSPITAL_COMMUNITY): Payer: Self-pay | Admitting: *Deleted

## 2013-05-04 DIAGNOSIS — D72829 Elevated white blood cell count, unspecified: Secondary | ICD-10-CM

## 2013-05-04 DIAGNOSIS — N390 Urinary tract infection, site not specified: Secondary | ICD-10-CM

## 2013-05-04 DIAGNOSIS — I959 Hypotension, unspecified: Secondary | ICD-10-CM

## 2013-05-04 LAB — GLUCOSE, CAPILLARY
Glucose-Capillary: 167 mg/dL — ABNORMAL HIGH (ref 70–99)
Glucose-Capillary: 231 mg/dL — ABNORMAL HIGH (ref 70–99)

## 2013-05-04 LAB — CBC
HCT: 35.5 % — ABNORMAL LOW (ref 36.0–46.0)
Hemoglobin: 11.7 g/dL — ABNORMAL LOW (ref 12.0–15.0)
RBC: 4.56 MIL/uL (ref 3.87–5.11)
WBC: 19.4 10*3/uL — ABNORMAL HIGH (ref 4.0–10.5)

## 2013-05-04 LAB — BASIC METABOLIC PANEL
BUN: 28 mg/dL — ABNORMAL HIGH (ref 6–23)
CO2: 24 mEq/L (ref 19–32)
Chloride: 101 mEq/L (ref 96–112)
GFR calc non Af Amer: 31 mL/min — ABNORMAL LOW (ref >90)
Glucose, Bld: 216 mg/dL — ABNORMAL HIGH (ref 70–99)
Potassium: 4.7 mEq/L (ref 3.5–5.1)
Sodium: 136 mEq/L (ref 135–145)

## 2013-05-04 MED ORDER — ASPIRIN 325 MG PO TABS
325.0000 mg | ORAL_TABLET | ORAL | Status: DC
  Administered 2013-05-04 – 2013-05-07 (×4): 325 mg via ORAL
  Filled 2013-05-04 (×4): qty 1

## 2013-05-04 MED ORDER — MELOXICAM 15 MG PO TABS
15.0000 mg | ORAL_TABLET | Freq: Every day | ORAL | Status: DC
  Administered 2013-05-04 – 2013-05-07 (×4): 15 mg via ORAL
  Filled 2013-05-04 (×5): qty 1

## 2013-05-04 MED ORDER — INSULIN ASPART 100 UNIT/ML ~~LOC~~ SOLN
0.0000 [IU] | Freq: Every day | SUBCUTANEOUS | Status: DC
Start: 2013-05-04 — End: 2013-05-07
  Administered 2013-05-04 – 2013-05-06 (×3): 2 [IU] via SUBCUTANEOUS

## 2013-05-04 MED ORDER — WARFARIN SODIUM 10 MG PO TABS
10.0000 mg | ORAL_TABLET | Freq: Once | ORAL | Status: AC
  Administered 2013-05-04: 10 mg via ORAL
  Filled 2013-05-04 (×2): qty 1

## 2013-05-04 MED ORDER — WARFARIN - PHARMACIST DOSING INPATIENT
Freq: Every day | Status: DC
  Administered 2013-05-05 – 2013-05-06 (×2)

## 2013-05-04 MED ORDER — INSULIN ASPART 100 UNIT/ML ~~LOC~~ SOLN
0.0000 [IU] | Freq: Three times a day (TID) | SUBCUTANEOUS | Status: DC
  Administered 2013-05-04: 5 [IU] via SUBCUTANEOUS
  Administered 2013-05-05: 2 [IU] via SUBCUTANEOUS
  Administered 2013-05-05: 5 [IU] via SUBCUTANEOUS
  Administered 2013-05-05: 2 [IU] via SUBCUTANEOUS
  Administered 2013-05-06: 3 [IU] via SUBCUTANEOUS
  Administered 2013-05-06: 13:00:00 via SUBCUTANEOUS
  Administered 2013-05-06: 7 [IU] via SUBCUTANEOUS
  Administered 2013-05-07 (×3): 3 [IU] via SUBCUTANEOUS

## 2013-05-04 MED ORDER — FUROSEMIDE 20 MG PO TABS
40.0000 mg | ORAL_TABLET | ORAL | Status: DC
Start: 2013-05-04 — End: 2013-05-04

## 2013-05-04 MED ORDER — INSULIN ASPART 100 UNIT/ML ~~LOC~~ SOLN
0.0000 [IU] | Freq: Three times a day (TID) | SUBCUTANEOUS | Status: DC
  Administered 2013-05-04: 4 [IU] via SUBCUTANEOUS
  Administered 2013-05-04: 7 [IU] via SUBCUTANEOUS

## 2013-05-04 MED ORDER — FUROSEMIDE 40 MG PO TABS
40.0000 mg | ORAL_TABLET | Freq: Every day | ORAL | Status: DC
  Administered 2013-05-04 – 2013-05-07 (×4): 40 mg via ORAL
  Filled 2013-05-04 (×4): qty 1

## 2013-05-04 MED ORDER — LEVOTHYROXINE SODIUM 50 MCG PO TABS
50.0000 ug | ORAL_TABLET | Freq: Every day | ORAL | Status: DC
  Administered 2013-05-04 – 2013-05-07 (×4): 50 ug via ORAL
  Filled 2013-05-04 (×5): qty 1

## 2013-05-04 MED ORDER — DONEPEZIL HCL 5 MG PO TABS
5.0000 mg | ORAL_TABLET | Freq: Every day | ORAL | Status: DC
  Administered 2013-05-04 – 2013-05-07 (×4): 5 mg via ORAL
  Filled 2013-05-04 (×4): qty 1

## 2013-05-04 MED ORDER — SODIUM CHLORIDE 0.9 % IJ SOLN
3.0000 mL | Freq: Two times a day (BID) | INTRAMUSCULAR | Status: DC
  Administered 2013-05-04 – 2013-05-07 (×6): 3 mL via INTRAVENOUS

## 2013-05-04 MED ORDER — POTASSIUM CHLORIDE CRYS ER 20 MEQ PO TBCR
20.0000 meq | EXTENDED_RELEASE_TABLET | Freq: Every day | ORAL | Status: DC
Start: 2013-05-04 — End: 2013-05-04

## 2013-05-04 MED ORDER — DEXTROSE 5 % IV SOLN
1.0000 g | INTRAVENOUS | Status: DC
  Administered 2013-05-04 – 2013-05-05 (×2): 1 g via INTRAVENOUS
  Filled 2013-05-04 (×3): qty 10

## 2013-05-04 MED ORDER — INSULIN GLARGINE 100 UNIT/ML ~~LOC~~ SOLN
30.0000 [IU] | Freq: Every day | SUBCUTANEOUS | Status: DC
  Administered 2013-05-04 – 2013-05-05 (×2): 30 [IU] via SUBCUTANEOUS
  Filled 2013-05-04 (×3): qty 0.3

## 2013-05-04 NOTE — Progress Notes (Signed)
Attempted to call for report.  Shawna Orleans, ED RN, will call me back.  Zamariya Neal RN, Justine Null

## 2013-05-04 NOTE — H&P (Signed)
[facility] History and Physical  Sara Dennis XLK:[id number] DOB: 09/08/1922 DOA: 05/03/2013  Referring physician: ED PCP: Theodoro Kos, MD  Chief Complaint: Generalized weakness  HPI: Sara Dennis is a 79 y.o. female who presents to the ED with rigors.  She admits to severe chills, diffuse weakness, and some SOB too.  All of this started about 30 min PTA in the ED, and she had 1 episode of vomiting in the ED.  She does not have dysuria, but work up in the ED demonstrates UTI.  She is started on rocephin and hospitalist has been asked to admit.  Thankfully by the time I evaluate the patient she is feeling much better and is able to keep down some water PO.  Her blood pressure which was initially 92/70 has significantly improved to 150/91.  Review of Systems: 12 systems reviewed and otherwise negative.  Past Medical History  Diagnosis Date  . Diabetes mellitus   . Shortness of breath     with activity  . CHF (congestive heart failure) may 2012  . Pulmonary embolus 03-23-11  . Arthritis     left leg  . UTI (lower urinary tract infection)    Past Surgical History  Procedure Laterality Date  . Kisney stone removal  1978    removed thru bladder  . Cystoscopy w/ ureteral stent placement  09/06/2011    Procedure: CYSTOSCOPY WITH RETROGRADE PYELOGRAM/URETERAL STENT PLACEMENT;  Surgeon: Valetta Fuller, MD;  Location: [facility];  Service: Urology;  Laterality: Right;  cysto double j stent placement  . Ureteroscopy  09/06/2011    Procedure: URETEROSCOPY;  Surgeon: Valetta Fuller, MD;  Location: [facility];  Service: Urology;  Laterality: Right;   Social History:  reports that she has never smoked. She has never used smokeless tobacco. She reports that she does not drink alcohol or use illicit drugs.   Allergies  Allergen Reactions  . Iohexol   . Ivp Dye (Iodinated Diagnostic Agents)     itching  . Bactrim Itching and Rash    No family history on file. Non-contributory to current  UTI.  Prior to Admission medications   Medication Sig Start Date End Date Taking? Authorizing Provider  aspirin 325 MG tablet Take 325 mg by mouth every morning.     Yes Historical Provider, MD  beta carotene w/minerals (OCUVITE) tablet Take 1 tablet by mouth daily.     Yes Historical Provider, MD  donepezil (ARICEPT) 5 MG tablet Take 5 mg by mouth every morning.    Yes Historical Provider, MD  furosemide (LASIX) 40 MG tablet Take 40 mg by mouth every morning.    Yes Historical Provider, MD  insulin glargine (LANTUS) 100 UNIT/ML injection Inject 35 Units into the skin 2 (two) times daily.    Yes Historical Provider, MD  levothyroxine (SYNTHROID, LEVOTHROID) 50 MCG tablet Take 50 mcg by mouth daily before breakfast.   Yes Historical Provider, MD  meloxicam (MOBIC) 15 MG tablet Take 15 mg by mouth daily.   Yes Historical Provider, MD  metFORMIN (GLUMETZA) 500 MG (MOD) 24 hr tablet Take 500 mg by mouth every evening.   Yes Historical Provider, MD  potassium chloride SA (K-DUR,KLOR-CON) 20 MEQ tablet Take 20 mEq by mouth daily.   Yes Historical Provider, MD  trimethoprim (TRIMPEX) 100 MG tablet Take 100 mg by mouth every morning.    Yes Historical Provider, MD  warfarin (COUMADIN) 5 MG tablet Take 7.5-10 mg by mouth every evening. 10mg  on Monday,  Wednesday, AND Friday.  7.5mg  ON Sunday, Tuesday, Thursday, Saturday.   Yes Historical Provider, MD   Physical Exam: Filed Vitals:   05/04/13 0100  BP: 150/91  Pulse: 74  Temp:   Resp: 20    General:  NAD, resting comfortably in bed Eyes: PEERLA EOMI ENT: mucous membranes moist Neck: supple w/o JVD Cardiovascular: RRR w/o MRG Respiratory: CTA B Abdomen: soft, nt, nd, bs+ Skin: no rash nor lesion Musculoskeletal: MAE, full ROM all 4 extremities Psychiatric: normal tone and affect Neurologic: AAOx3, grossly non-focal   Labs on Admission:  Basic Metabolic Panel:  Recent Labs Lab 05/03/13 1518  NA 140  K 4.5  CL 101  CO2 29  GLUCOSE  126*  BUN 27*  CREATININE 1.36*  CALCIUM 9.4   Liver Function Tests: No results found for this basename: AST, ALT, ALKPHOS, BILITOT, PROT, ALBUMIN,  in the last 168 hours No results found for this basename: LIPASE, AMYLASE,  in the last 168 hours No results found for this basename: AMMONIA,  in the last 168 hours CBC:  Recent Labs Lab 05/03/13 1518  WBC 15.8*  NEUTROABS 13.2*  HGB 13.4  HCT 39.2  MCV 77.5*  PLT 275   Cardiac Enzymes: No results found for this basename: CKTOTAL, CKMB, CKMBINDEX, TROPONINI,  in the last 168 hours  BNP (last 3 results)  Recent Labs  01/30/13 0239 05/03/13 1518  PROBNP 1504.0* 448.4   CBG: No results found for this basename: GLUCAP,  in the last 168 hours  Radiological Exams on Admission: Nm Pulmonary Perf And Vent  05/03/2013   *RADIOLOGY REPORT*  Clinical Data:  Acute onset of dyspnea.  NUCLEAR MEDICINE VENTILATION - PERFUSION LUNG SCAN  Technique:  Ventilation images were obtained in multiple projections using inhaled aerosol technetium 99 M DTPA.  Perfusion images were obtained in multiple projections after intravenous injection of Tc-42m MAA.  Radiopharmaceuticals:  Tc-81m DTPA aerosol and six mCi Tc-89m MAA.  Comparison: Plain film of the chest 05/03/2013.  Findings:  Ventilation:   No focal ventilation defect.  Perfusion:   No wedge shaped peripheral perfusion defects to suggest acute pulmonary embolism.  Radiotracer distribution is mildly heterogeneous.  IMPRESSION: Low probability for pulmonary embolus.   Original Report Authenticated By: Holley Dexter, M.D.   Dg Chest Portable 1 View  05/03/2013   *RADIOLOGY REPORT*  Clinical Data: Weakness  PORTABLE CHEST - 1 VIEW  Comparison: Jan 30, 2013  Findings: Degree of inspiration is shallow.  There is mild interstitial prominence which is probably due to mild edema superimposed on shallow inspiration.  Heart is borderline prominent with normal pulmonary vascularity.  No adenopathy.  There  is arthropathy in the left shoulder, stable.  IMPRESSION: Suspect a degree of mild congestive heart failure, stable.  No consolidation.   Original Report Authenticated By: Bretta Bang, M.D.    EKG: Independently reviewed.  Assessment/Plan Principal Problem:   UTI (urinary tract infection) Active Problems:   Leukocytosis, unspecified   Hypotension   1. UTI - treating UTI with rocephin, cultures pending, thankfully patients hypotension has resolved, rigors have resolved, and patient is looking much better than when she came in.  Patient is now actually starting to develop HYPERtension which is more c/w her baseline.  Have started her on home BP meds except diuretics (and home potassium held as well) suspect that these can be restarted later today or tomorrow. 2. Leukocytosis - Suspicious that patient initially had fever but this is not documented to complete the  SIRS and sepsis diagnosis.    Code Status: DNR confirmed with family (must indicate code status--if unknown or must be presumed, indicate so) Family Communication: spoke with family at bedside (indicate person spoken with, if applicable, with phone number if by telephone) Disposition Plan: Admit to inpatient (indicate anticipated LOS)  Time spent: 70 min  GARDNER, JARED M. [facility] Pager [phone]  If 7PM-7AM, please contact night-coverage [email or web address] Password [email or web address] 05/04/2013, 4:12 AM

## 2013-05-04 NOTE — Progress Notes (Signed)
Patient arrived to the floor via stretcher from the ED at approximately 0435.  She is A & O x 4.  Daughter, [name], and Ivor Costa are at the bedside.  She uses a cane at home.  Is also SOB on exertion, feels dizziness when ambulating and urinary urgency/incontinence.  Stressed to patient and family that she is considered a high fall risk.  Yellow armband, red socks, and bed alarm on.  Patient and family are in agreement.  Verified that patient is DNR.  Skin assessed and found to be dry.  No complaints of pain.  Will continue to monitor patient.  Gioia Ranes, Justine Null

## 2013-05-04 NOTE — Progress Notes (Addendum)
[facility] PROGRESS NOTE  Sara Dennis ZOX:[id number] DOB: 02/19/1922 DOA: 05/03/2013 PCP: Theodoro Kos, MD  Brief narrative 79 year old female patient with history of DM 2, chronic CHF, pulmonary embolism, urinary tract infection was admitted to the hospital on 05/04/13 with complaints of generalized weakness, chills, rigors and dyspnea. Symptoms apparently started 30 minutes PTA. Associated with one episode of vomiting. Denied dysuria. In the ED, UA positive for features of UTI, initially hypotensive 92/70 which improved subsequently. Chest x-ray was suggestive of mild pulmonary edema and VQ scan was low probability for PE.  Assessment/Plan:  Sepsis, present on admission - Likely secondary to urinary tract infection - Patient was hypotensive and had leukocytosis on admission - Hypotension resolved. - Continue IV Rocephin pending urine culture results  Urinary tract infection - Continue Rocephin pending urine culture results.  Leukocytosis - Secondary to sepsis/UTI. - Follow daily CBCs  Dyspnea/mild acute on chronic diastolic CHF. - Resume home Lasix 40 mg daily. - Monitor clinically. -VQ scan: Low probability for PE - 2-D echo 01/31/13: Moderate LVH. LVEF 55-60%.  - ? A. fib on telemetry-may have contributed to CHF. Repeat EKG  ? A. fib -Repeat EKG. - INR: 1.72. Coumadin per pharmacy. She may have been on anticoagulation for history of PE. Continue aspirin.  Hypothyroidism - Continue Synthroid.  Uncontrolled type II DM - Continue Lantus (at reduced dose to avoid hypoglycemia from poor oral intake) and change sliding scale insulin to sensitive. - Hold metformin secondary to worsening creatinine.  Stage III chronic kidney disease - Creatinine on 5/9:1.12. Currently 1.47. - Follow BMP in a.m.  Microcytic anemia - Follow CBC in a.m. - Outpatient workup as deemed necessary.  History of PE, on chronic warfarin - INR subtherapeutic. Coumadin per pharmacy.? Duration  of anticoagulation-defer to outpatient PCP.   Code Status: DO NOT RESUSCITATE  Family Communication: None  Disposition Plan: Home when medically stable.   Consultants:  None  Procedures:  None  Antibiotics:  IV Rocephin 8/7 >   HPI/Subjective: Patient denied complaints this morning. She denies dyspnea or pain.   Objective: Filed Vitals:   05/04/13 0300 05/04/13 0400 05/04/13 0505 05/04/13 1000  BP: 71/41 143/56 124/46 133/53  Pulse: 69 73 70 66  Temp:   98.9 F (37.2 C) 98.8 F (37.1 C)  TempSrc:   Oral Oral  Resp: 20 23 22 18   Height:   5\' 4"  (1.626 m)   Weight:   110.2 kg (242 lb 15.2 oz)   SpO2: 94% 94% 94% 93%    Intake/Output Summary (Last 24 hours) at 05/04/13 1228 Last data filed at 05/04/13 0900  Gross per 24 hour  Intake    120 ml  Output      0 ml  Net    120 ml   Filed Weights   05/04/13 0505  Weight: 110.2 kg (242 lb 15.2 oz)    Exam:   General exam: Comfortable.  Respiratory system:  reduced breath sounds in the bases with occasional basal crackles . No increased work of breathing.  Cardiovascular system: S1 & S2 heard, RRR. No JVD, murmurs, gallops, clicks or pedal edema. telemetry:? A. fib with CVR   Gastrointestinal system: Abdomen is nondistended, soft and nontender. Normal bowel sounds heard.  Central nervous system: Alert and oriented. No focal neurological deficits.  Extremities: Symmetric 5 x 5 power.   Data Reviewed: Basic Metabolic Panel:  Recent Labs Lab 05/03/13 1518 05/04/13 0520  NA 140 136  K 4.5 4.7  CL 101  101  CO2 29 24  GLUCOSE 126* 216*  BUN 27* 28*  CREATININE 1.36* 1.47*  CALCIUM 9.4 8.5   Liver Function Tests: No results found for this basename: AST, ALT, ALKPHOS, BILITOT, PROT, ALBUMIN,  in the last 168 hours No results found for this basename: LIPASE, AMYLASE,  in the last 168 hours No results found for this basename: AMMONIA,  in the last 168 hours CBC:  Recent Labs Lab 05/03/13 1518  05/04/13 0520  WBC 15.8* 19.4*  NEUTROABS 13.2*  --   HGB 13.4 11.7*  HCT 39.2 35.5*  MCV 77.5* 77.9*  PLT 275 234   Cardiac Enzymes: No results found for this basename: CKTOTAL, CKMB, CKMBINDEX, TROPONINI,  in the last 168 hours BNP (last 3 results)  Recent Labs  01/30/13 0239 05/03/13 1518  PROBNP 1504.0* 448.4   CBG:  Recent Labs Lab 05/04/13 0842  GLUCAP 202*    Recent Results (from the past 240 hour(s))  URINE CULTURE     Status: None   Collection Time    05/03/13  5:11 PM      Result Value Range Status   Specimen Description URINE, CLEAN CATCH   Final   Special Requests NONE   Final   Culture  Setup Time     Final   Value: 05/03/2013 19:01     Performed at [facility] Count     Final   Value: >=100,000 COLONIES/ML     Performed at [facility]   Culture     Final   Value: GRAM NEGATIVE RODS     Performed at [facility]   Report Status PENDING   Incomplete     Studies: Nm Pulmonary Perf And Vent  05/03/2013   *RADIOLOGY REPORT*  Clinical Data:  Acute onset of dyspnea.  NUCLEAR MEDICINE VENTILATION - PERFUSION LUNG SCAN  Technique:  Ventilation images were obtained in multiple projections using inhaled aerosol technetium 99 M DTPA.  Perfusion images were obtained in multiple projections after intravenous injection of Tc-31m MAA.  Radiopharmaceuticals:  Tc-4m DTPA aerosol and six mCi Tc-7m MAA.  Comparison: Plain film of the chest 05/03/2013.  Findings:  Ventilation:   No focal ventilation defect.  Perfusion:   No wedge shaped peripheral perfusion defects to suggest acute pulmonary embolism.  Radiotracer distribution is mildly heterogeneous.  IMPRESSION: Low probability for pulmonary embolus.   Original Report Authenticated By: Holley Dexter, M.D.   Dg Chest Portable 1 View  05/03/2013   *RADIOLOGY REPORT*  Clinical Data: Weakness  PORTABLE CHEST - 1 VIEW  Comparison: Jan 30, 2013  Findings: Degree of inspiration is  shallow.  There is mild interstitial prominence which is probably due to mild edema superimposed on shallow inspiration.  Heart is borderline prominent with normal pulmonary vascularity.  No adenopathy.  There is arthropathy in the left shoulder, stable.  IMPRESSION: Suspect a degree of mild congestive heart failure, stable.  No consolidation.   Original Report Authenticated By: Bretta Bang, M.D.     Additional labs:   Scheduled Meds: . acetaminophen  1,000 mg Oral Once  . aspirin  324 mg Oral Once  . aspirin  325 mg Oral BH-q7a  . donepezil  5 mg Oral Daily  . insulin aspart  0-20 Units Subcutaneous TID WC  . insulin glargine  30 Units Subcutaneous QHS  . levothyroxine  50 mcg Oral QAC breakfast  . meloxicam  15 mg Oral Q breakfast  . sodium chloride  3 mL Intravenous Q12H  . Warfarin - Pharmacist Dosing Inpatient   Does not apply q1800   Continuous Infusions:   Principal Problem:   UTI (urinary tract infection) Active Problems:   Leukocytosis, unspecified   Hypotension    Time spent: 40 minutes    [facility]  [facility] Pager [phone].   If 8PM-8AM, please contact night-coverage at [email or web address], password [facility] 05/04/2013, 12:28 PM  LOS: 1 day

## 2013-05-04 NOTE — Progress Notes (Signed)
Utilization review completed.  

## 2013-05-04 NOTE — Progress Notes (Signed)
ANTICOAGULATION CONSULT NOTE - Initial Consult  Pharmacy Consult for coumadin Indication: atrial fibrillation  Allergies  Allergen Reactions  . Iohexol   . Ivp Dye (Iodinated Diagnostic Agents)     itching  . Bactrim Itching and Rash    Patient Measurements:   Dosing Weight: 109.8 kg  Vital Signs: BP: 150/91 mmHg (08/08 0100) Pulse Rate: 74 (08/08 0100)  Labs:  Recent Labs  05/03/13 1518  HGB 13.4  HCT 39.2  PLT 275  LABPROT 19.7*  INR 1.72*  CREATININE 1.36*    The CrCl is unknown because both a height and weight (above a minimum accepted value) are required for this calculation.   Medical History: Past Medical History  Diagnosis Date  . Diabetes mellitus   . Shortness of breath     with activity  . CHF (congestive heart failure) may 2012  . Pulmonary embolus 03-23-11  . Arthritis     left leg  . UTI (lower urinary tract infection)     Medications:   (Not in a hospital admission)  Assessment: 79 yo lady to continue coumadin for afib.  Admission INR  1.72.  Last dose coumadin per med rec was 8/6.  Home dose listed as 7.5 mg TThSS and 10 mg MWF.  Her VQ scan was negative for PE. Goal of Therapy:  INR 2-3 Monitor platelets by anticoagulation protocol: Yes   Plan:  Coumadin 10 mg po today. Check daily PT/INR. Monitor for bleeding.  Talbert Cage Poteet 05/04/2013,4:16 AM

## 2013-05-05 DIAGNOSIS — I5033 Acute on chronic diastolic (congestive) heart failure: Secondary | ICD-10-CM

## 2013-05-05 DIAGNOSIS — I509 Heart failure, unspecified: Secondary | ICD-10-CM

## 2013-05-05 DIAGNOSIS — E119 Type 2 diabetes mellitus without complications: Secondary | ICD-10-CM

## 2013-05-05 LAB — BASIC METABOLIC PANEL
BUN: 27 mg/dL — ABNORMAL HIGH (ref 6–23)
CO2: 25 mEq/L (ref 19–32)
Chloride: 104 mEq/L (ref 96–112)
GFR calc Af Amer: 38 mL/min — ABNORMAL LOW (ref >90)
Glucose, Bld: 194 mg/dL — ABNORMAL HIGH (ref 70–99)
Potassium: 4.7 mEq/L (ref 3.5–5.1)

## 2013-05-05 LAB — GLUCOSE, CAPILLARY: Glucose-Capillary: 171 mg/dL — ABNORMAL HIGH (ref 70–99)

## 2013-05-05 LAB — URINE CULTURE: Colony Count: >=100000

## 2013-05-05 LAB — CBC
Hemoglobin: 11.6 g/dL — ABNORMAL LOW (ref 12.0–15.0)
MCH: 25.7 pg — ABNORMAL LOW (ref 26.0–34.0)
Platelets: 239 10*3/uL (ref 150–400)
RBC: 4.52 MIL/uL (ref 3.87–5.11)
WBC: 16.4 10*3/uL — ABNORMAL HIGH (ref 4.0–10.5)

## 2013-05-05 MED ORDER — WARFARIN SODIUM 10 MG PO TABS
10.0000 mg | ORAL_TABLET | Freq: Once | ORAL | Status: AC
  Administered 2013-05-05: 10 mg via ORAL
  Filled 2013-05-05: qty 1

## 2013-05-05 NOTE — Progress Notes (Signed)
[facility] PROGRESS NOTE  HASET OAXACA ZOX:[id number] DOB: [birth year] DOA: 05/03/2013 PCP: Theodoro Kos, MD  Brief narrative 79 year old female patient with history of DM 2, chronic CHF, pulmonary embolism, urinary tract infection was admitted to the hospital on 05/04/13 with complaints of generalized weakness, chills, rigors and dyspnea. Symptoms apparently started 30 minutes PTA. Associated with one episode of vomiting. Denied dysuria. In the ED, UA positive for features of UTI, initially hypotensive 92/70 which improved subsequently. Chest x-ray was suggestive of mild pulmonary edema and VQ scan was low probability for PE.  Assessment/Plan:  Sepsis, present on admission - Likely secondary to urinary tract infection - Patient was hypotensive and had leukocytosis on admission - Hypotension resolved. - Continue IV Rocephin  Enterobacter cloaca Urinary tract infection - Continue Rocephin for additional 24 hours and consider discharging on oral Ceftin or Keflex.  Leukocytosis - Secondary to sepsis/UTI. - Improving.  Dyspnea/mild acute on chronic diastolic CHF. - Resumed home Lasix 40 mg daily. - Monitor clinically. -VQ scan: Low probability for PE - 2-D echo 01/31/13: Moderate LVH. LVEF 55-60%.  - ? A. fib on telemetry-may have contributed to CHF. Repeat EKG- seems SR on EKG 8/8- but will review with Cardiology  ? A. fib -Repeat EKG- as above. - INR: 1.72. Coumadin per pharmacy. She may have been on anticoagulation for history of PE. Continue aspirin.  Hypothyroidism - Continue Synthroid.  Uncontrolled type II DM - Continue Lantus (at reduced dose to avoid hypoglycemia from poor oral intake) and change sliding scale insulin to sensitive. - Hold metformin secondary to worsening creatinine.  Stage III chronic kidney disease - Creatinine on 5/9:1.12.  - Creatinine has been in the 1.36-1.47 range since admission-? Stage III chronic kidney disease. It also may be secondary  to diuretics. - Stable  Microcytic anemia - Stable - Outpatient workup as deemed necessary.  History of PE, on chronic warfarin - INR subtherapeutic. Coumadin per pharmacy.? Duration of anticoagulation-defer to outpatient PCP.   Code Status: DO NOT RESUSCITATE  Family Communication: None  Disposition Plan: Home possibly next 24-48 hours pending PT input.   Consultants:  None  Procedures:  None  Antibiotics:  IV Rocephin 8/7 >   HPI/Subjective: Complains of generalized weakness but denies dyspnea, cough or chest pain.  Objective: Filed Vitals:   05/04/13 1800 05/04/13 2031 05/05/13 0621 05/05/13 0840  BP: 151/57 120/68 122/53 152/80  Pulse: 78 84 81 87  Temp: 99.6 F (37.6 C) 99 F (37.2 C) 98.1 F (36.7 C) 97.7 F (36.5 C)  TempSrc: Oral Oral Oral Oral  Resp: 18 18 18 18   Height:  5\' 4"  (1.626 m)    Weight:  110.201 kg (242 lb 15.2 oz)    SpO2: 92% 97%  98%    Intake/Output Summary (Last 24 hours) at 05/05/13 1320 Last data filed at 05/05/13 0800  Gross per 24 hour  Intake    540 ml  Output      0 ml  Net    540 ml   Filed Weights   05/04/13 0505 05/04/13 2031  Weight: 110.2 kg (242 lb 15.2 oz) 110.201 kg (242 lb 15.2 oz)    Exam:   General exam: Comfortable.  Respiratory system:  reduced breath sounds in the bases with occasional basal crackles . No increased work of breathing.  Cardiovascular system: S1 & S2 heard, RRR. No JVD, murmurs, gallops, clicks or pedal edema.   Gastrointestinal system: Abdomen is nondistended, soft and nontender. Normal bowel sounds heard.  Central nervous system: Alert and oriented. No focal neurological deficits.  Extremities: Symmetric 5 x 5 power.   Data Reviewed: Basic Metabolic Panel:  Recent Labs Lab 05/03/13 1518 05/04/13 0520 05/05/13 0600  NA 140 136 140  K 4.5 4.7 4.7  CL 101 101 104  CO2 29 24 25   GLUCOSE 126* 216* 194*  BUN 27* 28* 27*  CREATININE 1.36* 1.47* 1.39*  CALCIUM 9.4 8.5 9.0    Liver Function Tests: No results found for this basename: AST, ALT, ALKPHOS, BILITOT, PROT, ALBUMIN,  in the last 168 hours No results found for this basename: LIPASE, AMYLASE,  in the last 168 hours No results found for this basename: AMMONIA,  in the last 168 hours CBC:  Recent Labs Lab 05/03/13 1518 05/04/13 0520 05/05/13 0600  WBC 15.8* 19.4* 16.4*  NEUTROABS 13.2*  --   --   HGB 13.4 11.7* 11.6*  HCT 39.2 35.5* 35.2*  MCV 77.5* 77.9* 77.9*  PLT 275 234 239   Cardiac Enzymes: No results found for this basename: CKTOTAL, CKMB, CKMBINDEX, TROPONINI,  in the last 168 hours BNP (last 3 results)  Recent Labs  01/30/13 0239 05/03/13 1518  PROBNP 1504.0* 448.4   CBG:  Recent Labs Lab 05/04/13 1229 05/04/13 1713 05/04/13 2224 05/05/13 0749 05/05/13 1226  GLUCAP 167* 257* 231* 171* 191*    Recent Results (from the past 240 hour(s))  URINE CULTURE     Status: None   Collection Time    05/03/13  5:11 PM      Result Value Range Status   Specimen Description URINE, CLEAN CATCH   Final   Special Requests NONE   Final   Culture  Setup Time     Final   Value: 05/03/2013 19:01     Performed at [facility] Count     Final   Value: >=100,000 COLONIES/ML     Performed at [facility]   Culture     Final   Value: ENTEROBACTER CLOACAE     Performed at [facility]   Report Status 05/05/2013 FINAL   Final   Organism ID, Bacteria ENTEROBACTER CLOACAE   Final     Studies: Nm Pulmonary Perf And Vent  05/03/2013   *RADIOLOGY REPORT*  Clinical Data:  Acute onset of dyspnea.  NUCLEAR MEDICINE VENTILATION - PERFUSION LUNG SCAN  Technique:  Ventilation images were obtained in multiple projections using inhaled aerosol technetium 99 M DTPA.  Perfusion images were obtained in multiple projections after intravenous injection of Tc-62m MAA.  Radiopharmaceuticals:  Tc-37m DTPA aerosol and six mCi Tc-24m MAA.  Comparison: Plain film of the chest  05/03/2013.  Findings:  Ventilation:   No focal ventilation defect.  Perfusion:   No wedge shaped peripheral perfusion defects to suggest acute pulmonary embolism.  Radiotracer distribution is mildly heterogeneous.  IMPRESSION: Low probability for pulmonary embolus.   Original Report Authenticated By: Holley Dexter, M.D.   Dg Chest Portable 1 View  05/03/2013   *RADIOLOGY REPORT*  Clinical Data: Weakness  PORTABLE CHEST - 1 VIEW  Comparison: Jan 30, 2013  Findings: Degree of inspiration is shallow.  There is mild interstitial prominence which is probably due to mild edema superimposed on shallow inspiration.  Heart is borderline prominent with normal pulmonary vascularity.  No adenopathy.  There is arthropathy in the left shoulder, stable.  IMPRESSION: Suspect a degree of mild congestive heart failure, stable.  No consolidation.   Original Report Authenticated By:  Bretta Bang, M.D.     Additional labs:   Scheduled Meds: . acetaminophen  1,000 mg Oral Once  . aspirin  324 mg Oral Once  . aspirin  325 mg Oral BH-q7a  . cefTRIAXone (ROCEPHIN)  IV  1 g Intravenous Q24H  . donepezil  5 mg Oral Daily  . furosemide  40 mg Oral Daily  . insulin aspart  0-5 Units Subcutaneous QHS  . insulin aspart  0-9 Units Subcutaneous TID WC  . insulin glargine  30 Units Subcutaneous QHS  . levothyroxine  50 mcg Oral QAC breakfast  . meloxicam  15 mg Oral Q breakfast  . sodium chloride  3 mL Intravenous Q12H  . Warfarin - Pharmacist Dosing Inpatient   Does not apply q1800   Continuous Infusions:   Principal Problem:   UTI (urinary tract infection) Active Problems:   Leukocytosis, unspecified   Hypotension    Time spent: 20 minutes    [facility]  [facility] Pager [phone].   If 8PM-8AM, please contact night-coverage at [email or web address], password [facility] 05/05/2013, 1:20 PM  LOS: 2 days

## 2013-05-05 NOTE — Progress Notes (Signed)
ANTICOAGULATION CONSULT NOTE - Follow Up Consult  Pharmacy Consult for Coumadin Indication: atrial fibrillation  Allergies  Allergen Reactions  . Iohexol   . Ivp Dye (Iodinated Diagnostic Agents)     itching  . Bactrim Itching and Rash    Patient Measurements: Height: 5\' 4"  (162.6 cm) Weight: 242 lb 15.2 oz (110.201 kg) IBW/kg (Calculated) : 54.7  Vital Signs: Temp: 97.7 F (36.5 C) (08/09 0840) Temp src: Oral (08/09 0840) BP: 152/80 mmHg (08/09 0840) Pulse Rate: 87 (08/09 0840)  Labs:  Recent Labs  05/03/13 1518 05/04/13 0520 05/05/13 0600 05/05/13 1030  HGB 13.4 11.7* 11.6*  --   HCT 39.2 35.5* 35.2*  --   PLT 275 234 239  --   LABPROT 19.7*  --   --  18.3*  INR 1.72*  --   --  1.57*  CREATININE 1.36* 1.47* 1.39*  --     Estimated Creatinine Clearance: 34 ml/min (by C-G formula based on Cr of 1.39).   Medications:  Scheduled:  . acetaminophen  1,000 mg Oral Once  . aspirin  324 mg Oral Once  . aspirin  325 mg Oral BH-q7a  . cefTRIAXone (ROCEPHIN)  IV  1 g Intravenous Q24H  . donepezil  5 mg Oral Daily  . furosemide  40 mg Oral Daily  . insulin aspart  0-5 Units Subcutaneous QHS  . insulin aspart  0-9 Units Subcutaneous TID WC  . insulin glargine  30 Units Subcutaneous QHS  . levothyroxine  50 mcg Oral QAC breakfast  . meloxicam  15 mg Oral Q breakfast  . sodium chloride  3 mL Intravenous Q12H  . Warfarin - Pharmacist Dosing Inpatient   Does not apply q1800    Assessment: 79 yo F on Coumadin PTA for Afib, hx PE.  INR continues to trend down.  Noted patient essentially missed dose the evening of 8/7 while in the ER (ordered dose was not given until 8/8 AM and an additional dose was not ordered for 8/8 PM).  Due to age and risk factors for bleeding hesitant to increase doe beyond 10mg .  Will repeat this dose tonight and follow INR trend.  Goal of Therapy:  INR 2-3 Monitor platelets by anticoagulation protocol: Yes   Plan:  Coumadin 10 mg PO x 1  tonight. Daily INR.  [employer], Pharm.D., BCPS Clinical Pharmacist Pager [phone] 05/05/2013 1:57 PM

## 2013-05-06 DIAGNOSIS — I4892 Unspecified atrial flutter: Secondary | ICD-10-CM

## 2013-05-06 LAB — GLUCOSE, CAPILLARY
Glucose-Capillary: 229 mg/dL — ABNORMAL HIGH (ref 70–99)
Glucose-Capillary: 284 mg/dL — ABNORMAL HIGH (ref 70–99)

## 2013-05-06 LAB — BASIC METABOLIC PANEL
BUN: 28 mg/dL — ABNORMAL HIGH (ref 6–23)
CO2: 26 mEq/L (ref 19–32)
Chloride: 102 mEq/L (ref 96–112)
Creatinine, Ser: 1.36 mg/dL — ABNORMAL HIGH (ref 0.50–1.10)
GFR calc Af Amer: 39 mL/min — ABNORMAL LOW (ref >90)

## 2013-05-06 LAB — CBC
HCT: 35.6 % — ABNORMAL LOW (ref 36.0–46.0)
MCH: 26.5 pg (ref 26.0–34.0)
MCV: 77.4 fL — ABNORMAL LOW (ref 78.0–100.0)
RDW: 16.2 % — ABNORMAL HIGH (ref 11.5–15.5)
WBC: 15.5 10*3/uL — ABNORMAL HIGH (ref 4.0–10.5)

## 2013-05-06 MED ORDER — INSULIN GLARGINE 100 UNIT/ML ~~LOC~~ SOLN
35.0000 [IU] | Freq: Two times a day (BID) | SUBCUTANEOUS | Status: DC
  Administered 2013-05-06 – 2013-05-07 (×3): 35 [IU] via SUBCUTANEOUS
  Filled 2013-05-06 (×4): qty 0.35

## 2013-05-06 MED ORDER — WARFARIN SODIUM 10 MG PO TABS
10.0000 mg | ORAL_TABLET | Freq: Once | ORAL | Status: AC
  Administered 2013-05-06: 10 mg via ORAL
  Filled 2013-05-06: qty 1

## 2013-05-06 MED ORDER — CIPROFLOXACIN HCL 250 MG PO TABS
250.0000 mg | ORAL_TABLET | Freq: Two times a day (BID) | ORAL | Status: DC
  Administered 2013-05-06 – 2013-05-07 (×3): 250 mg via ORAL
  Filled 2013-05-06 (×5): qty 1

## 2013-05-06 NOTE — Progress Notes (Signed)
[facility] PROGRESS NOTE  Sara Dennis WUJ:[id number] DOB: April 04, 1922 DOA: 05/03/2013 PCP: Theodoro Kos, MD  Brief narrative 79 year old female patient with history of DM 2, chronic CHF, pulmonary embolism, urinary tract infection was admitted to the hospital on 05/04/13 with complaints of generalized weakness, chills, rigors and dyspnea. Symptoms apparently started 30 minutes PTA. Associated with one episode of vomiting. Denied dysuria. In the ED, UA positive for features of UTI, initially hypotensive 92/70 which improved subsequently. Chest x-ray was suggestive of mild pulmonary edema and VQ scan was low probability for PE.  Assessment/Plan:  Sepsis, present on admission - Likely secondary to urinary tract infection - Patient was hypotensive and had leukocytosis on admission - Hypotension resolved. - Change IV Rocephin to Cipro  Enterobacter cloaca Urinary tract infection - Patient was initially placed on IV Rocephin - Improved. Will change to oral Cipro and complete total 7 days course of antibiotics.  Leukocytosis - Secondary to sepsis/UTI. - Improving. - ? Chronic- seen in May too. OP follow up.  Dyspnea/mild acute on chronic diastolic CHF/chronic interstitial lung disease. - Resumed home Lasix 40 mg daily. - Monitor clinically. -VQ scan: Low probability for PE - 2-D echo 01/31/13: Moderate LVH. LVEF 55-60%.  - Atrial flutter on EKG (reviewed with [facility] cardiology) - Patient has chronic interstitial lung disease by CT 03/23/11.  Atrial flutter (new), with controlled ventricular rate -Repeat EKG- as above. - INR: 1.49. Coumadin per pharmacy. She may have been on anticoagulation for history of PE. Continue aspirin.  Hypothyroidism - Continue Synthroid.  Uncontrolled type II DM - Continue Lantus (at reduced dose to avoid hypoglycemia from poor oral intake) and change sliding scale insulin to sensitive. - Hold metformin secondary to worsening creatinine. - Increase  Lantus to home dose. CBG's uncontrolled & in 200's  Stage III chronic kidney disease - Creatinine on 5/9:1.12.  - Creatinine has been in the 1.36-1.47 range since admission-? Stage III chronic kidney disease. It also may be secondary to diuretics. - Stable  Microcytic anemia - Stable - Outpatient workup as deemed necessary.  History of PE, on chronic warfarin - INR subtherapeutic. Coumadin per pharmacy.   Code Status: DO NOT RESUSCITATE  Family Communication: None  Disposition Plan: Home possibly 8/11 pending PT evaluation   Consultants:  None  Procedures:  None  Antibiotics:  IV Rocephin 8/7 > 8/10  PO Cipro 8/10 >  HPI/Subjective: Feels much better. Occasional dyspnea.  Objective: Filed Vitals:   05/05/13 1806 05/05/13 2205 05/06/13 0439 05/06/13 0820  BP: 154/71 146/46 126/62 124/56  Pulse: 89 81 75 81  Temp: 97.6 F (36.4 C) 98.6 F (37 C) 98.2 F (36.8 C) 97 F (36.1 C)  TempSrc: Oral Oral Oral Oral  Resp: 18 18 22 22   Height:      Weight:  105.733 kg (233 lb 1.6 oz)    SpO2: 98% 95% 93% 94%    Intake/Output Summary (Last 24 hours) at 05/06/13 1218 Last data filed at 05/06/13 0730  Gross per 24 hour  Intake    600 ml  Output      1 ml  Net    599 ml   Filed Weights   05/04/13 0505 05/04/13 2031 05/05/13 2205  Weight: 110.2 kg (242 lb 15.2 oz) 110.201 kg (242 lb 15.2 oz) 105.733 kg (233 lb 1.6 oz)    Exam:   General exam: Comfortable.  Respiratory system:  reduced breath sounds in the bases with few coarse velcro like chronic sounding basal crackles .  Rest clear to auscultation. No increased work of breathing.  Cardiovascular system: S1 & S2 heard, RRR. No JVD, murmurs, gallops, clicks or pedal edema. DCed Tele.  Gastrointestinal system: Abdomen is nondistended, soft and nontender. Normal bowel sounds heard.  Central nervous system: Alert and oriented. No focal neurological deficits.  Extremities: Symmetric 5 x 5 power.   Data  Reviewed: Basic Metabolic Panel:  Recent Labs Lab 05/03/13 1518 05/04/13 0520 05/05/13 0600 05/06/13 0435  NA 140 136 140 138  K 4.5 4.7 4.7 4.3  CL 101 101 104 102  CO2 29 24 25 26   GLUCOSE 126* 216* 194* 258*  BUN 27* 28* 27* 28*  CREATININE 1.36* 1.47* 1.39* 1.36*  CALCIUM 9.4 8.5 9.0 9.1   Liver Function Tests: No results found for this basename: AST, ALT, ALKPHOS, BILITOT, PROT, ALBUMIN,  in the last 168 hours No results found for this basename: LIPASE, AMYLASE,  in the last 168 hours No results found for this basename: AMMONIA,  in the last 168 hours CBC:  Recent Labs Lab 05/03/13 1518 05/04/13 0520 05/05/13 0600 05/06/13 0435  WBC 15.8* 19.4* 16.4* 15.5*  NEUTROABS 13.2*  --   --   --   HGB 13.4 11.7* 11.6* 12.2  HCT 39.2 35.5* 35.2* 35.6*  MCV 77.5* 77.9* 77.9* 77.4*  PLT 275 234 239 249   Cardiac Enzymes: No results found for this basename: CKTOTAL, CKMB, CKMBINDEX, TROPONINI,  in the last 168 hours BNP (last 3 results)  Recent Labs  01/30/13 0239 05/03/13 1518  PROBNP 1504.0* 448.4   CBG:  Recent Labs Lab 05/05/13 0749 05/05/13 1226 05/05/13 1613 05/05/13 2201 05/06/13 0747  GLUCAP 171* 191* 258* 242* 229*    Recent Results (from the past 240 hour(s))  URINE CULTURE     Status: None   Collection Time    05/03/13  5:11 PM      Result Value Range Status   Specimen Description URINE, CLEAN CATCH   Final   Special Requests NONE   Final   Culture  Setup Time     Final   Value: 05/03/2013 19:01     Performed at [facility] Count     Final   Value: >=100,000 COLONIES/ML     Performed at [facility]   Culture     Final   Value: ENTEROBACTER CLOACAE     Performed at [facility]   Report Status 05/05/2013 FINAL   Final   Organism ID, Bacteria ENTEROBACTER CLOACAE   Final     Studies: No results found.   Additional labs:   Scheduled Meds: . acetaminophen  1,000 mg Oral Once  . aspirin  324 mg  Oral Once  . aspirin  325 mg Oral BH-q7a  . ciprofloxacin  250 mg Oral BID  . donepezil  5 mg Oral Daily  . furosemide  40 mg Oral Daily  . insulin aspart  0-5 Units Subcutaneous QHS  . insulin aspart  0-9 Units Subcutaneous TID WC  . insulin glargine  30 Units Subcutaneous QHS  . levothyroxine  50 mcg Oral QAC breakfast  . meloxicam  15 mg Oral Q breakfast  . sodium chloride  3 mL Intravenous Q12H  . Warfarin - Pharmacist Dosing Inpatient   Does not apply q1800   Continuous Infusions:   Principal Problem:   UTI (urinary tract infection) Active Problems:   Leukocytosis, unspecified   Hypotension   Diabetes mellitus    Time  spent: 20 minutes    [facility]  [facility] Pager [phone].   If 8PM-8AM, please contact night-coverage at [email or web address], password [facility] 05/06/2013, 12:18 PM  LOS: 3 days

## 2013-05-06 NOTE — Progress Notes (Signed)
ANTICOAGULATION CONSULT NOTE - Follow Up Consult  Pharmacy Consult for Coumadin Indication: atrial fibrillation  Allergies  Allergen Reactions  . Iohexol   . Ivp Dye (Iodinated Diagnostic Agents)     itching  . Bactrim Itching and Rash    Patient Measurements: Height: 5\' 4"  (162.6 cm) Weight: 233 lb 1.6 oz (105.733 kg) IBW/kg (Calculated) : 54.7  Vital Signs: Temp: 97 F (36.1 C) (08/10 0820) Temp src: Oral (08/10 0820) BP: 124/56 mmHg (08/10 0820) Pulse Rate: 81 (08/10 0820)  Labs:  Recent Labs  05/03/13 1518 05/04/13 0520 05/05/13 0600 05/05/13 1030 05/06/13 0435  HGB 13.4 11.7* 11.6*  --  12.2  HCT 39.2 35.5* 35.2*  --  35.6*  PLT 275 234 239  --  249  LABPROT 19.7*  --   --  18.3* 17.6*  INR 1.72*  --   --  1.57* 1.49  CREATININE 1.36* 1.47* 1.39*  --  1.36*    Estimated Creatinine Clearance: 33.9 ml/min (by C-G formula based on Cr of 1.36).   Medications:  Scheduled:  . acetaminophen  1,000 mg Oral Once  . aspirin  324 mg Oral Once  . aspirin  325 mg Oral BH-q7a  . ciprofloxacin  250 mg Oral BID  . donepezil  5 mg Oral Daily  . furosemide  40 mg Oral Daily  . insulin aspart  0-5 Units Subcutaneous QHS  . insulin aspart  0-9 Units Subcutaneous TID WC  . insulin glargine  35 Units Subcutaneous BID  . levothyroxine  50 mcg Oral QAC breakfast  . meloxicam  15 mg Oral Q breakfast  . sodium chloride  3 mL Intravenous Q12H  . Warfarin - Pharmacist Dosing Inpatient   Does not apply q1800    Assessment: 79 yo F on Coumadin PTA for Afib, hx PE.  INR continues to trend down.  Noted patient essentially missed dose the evening of 8/7 while in the ER (ordered dose was not given until 8/8 11AM and an additional dose was not ordered for 8/8 PM).  Due to age and risk factors for bleeding hesitant to increase dose beyond 10mg .  Noted antibiotic change to Cipro which can also increase INR.  Goal of Therapy:  INR 2-3 Monitor platelets by anticoagulation protocol:  Yes   Plan:  Coumadin 10 mg PO x 1 tonight. Daily INR.  [employer], Pharm.D., BCPS Clinical Pharmacist Pager [phone] 05/06/2013 1:56 PM

## 2013-05-06 NOTE — Evaluation (Signed)
Physical Therapy Evaluation Patient Details Name: Sara Dennis MRN: [id number] DOB: January 21, 1922 Today's Date: 05/06/2013 Time: 0865-7846 PT Time Calculation (min): 30 min  PT Assessment / Plan / Recommendation History of Present Illness  Sara Dennis is a 79 y.o. female who presents to the ED with rigors.  She admits to severe chills, diffuse weakness, and some SOB too.  All of this started about 30 min PTA in the ED, and she had 1 episode of vomiting in the ED.  She does not have dysuria, but work up in the ED demonstrates UTI.  She is started on rocephin and hospitalist has been asked to admit.  Clinical Impression  Pt very limited due to overall fatigue and unable to ambulate this session.  Pt will need assistance at d/c and will need to further assess amount of assistance available.  Per pt, pt stated "I have a lot of help at home.  I'm going home tomorrow." Pt currently unable to ambulated and has 2 steps to get into home.  Recommend either w/c with 2 person assist into home or ambulance transport if family and pt's desire to d/c home.  No family present on PT evaluation and unable to verify appropriate assistance available.  Pt will benefit from acute PT services to improve overall mobility and prepare for safe d/c home with family and HHPT unless agreeable to SNF.     PT Assessment  Patient needs continued PT services    Follow Up Recommendations  Home health PT;Supervision/Assistance - 24 hour (If unable to provide 24 assist will need SNF)    Equipment Recommendations  None recommended by PT    Frequency Min 3X/week    Precautions / Restrictions Restrictions Weight Bearing Restrictions: No   Pertinent Vitals/Pain No c/o pain      Mobility  Bed Mobility Bed Mobility: Not assessed (Sitting in recliner when entering room) Transfers Transfers: Sit to Stand;Stand to Sit Sit to Stand: 1: +2 Total assist;From chair/3-in-1 Sit to Stand: Patient Percentage: 50% Stand to Sit: 1:  +2 Total assist;To chair/3-in-1 Stand to Sit: Patient Percentage: 60% Details for Transfer Assistance: +2 (A) to initiate transfer and maintain balance with max cues for technique.  Ambulation/Gait Ambulation/Gait Assistance: Not tested (comment)    Exercises     PT Diagnosis: Difficulty walking;Generalized weakness  PT Problem List: Decreased strength;Decreased activity tolerance;Decreased balance;Decreased mobility;Decreased knowledge of use of DME PT Treatment Interventions: DME instruction;Gait training;Stair training;Functional mobility training;Therapeutic activities;Therapeutic exercise;Balance training;Patient/family education     PT Goals(Current goals can be found in the care plan section) Acute Rehab PT Goals Patient Stated Goal: To go home tomorrow PT Goal Formulation: With patient Time For Goal Achievement: 05/13/13 Potential to Achieve Goals: Good  Visit Information  Last PT Received On: 05/06/13 Assistance Needed: +2 History of Present Illness: Sara Dennis is a 79 y.o. female who presents to the ED with rigors.  She admits to severe chills, diffuse weakness, and some SOB too.  All of this started about 30 min PTA in the ED, and she had 1 episode of vomiting in the ED.  She does not have dysuria, but work up in the ED demonstrates UTI.  She is started on rocephin and hospitalist has been asked to admit.       Prior Functioning  Home Living Family/patient expects to be discharged to:: Private residence Living Arrangements: Children Available Help at Discharge: Family Type of Home: House Home Access: Stairs to enter [employer] of Steps: 3  Entrance Stairs-Rails: None Home Layout: One level Home Equipment: Cane - single point;Walker - 2 wheels;Wheelchair - manual;Shower seat Prior Function Level of Independence: Independent with assistive device(s) Communication Communication: No difficulties    Cognition  Cognition Arousal/Alertness:  Awake/alert Behavior During Therapy: WFL for tasks assessed/performed Overall Cognitive Status: No family/caregiver present to determine baseline cognitive functioning Area of Impairment: Awareness Awareness: Intellectual General Comments: Pt needing +2 (A) and continues to ask "Can I go home today?  I'll be able to do this at home without any help."    Extremity/Trunk Assessment Lower Extremity Assessment Lower Extremity Assessment: Generalized weakness   Balance Balance Balance Assessed: Yes Static Standing Balance Static Standing - Balance Support: Bilateral upper extremity supported Static Standing - Level of Assistance: 1: +2 Total assist Static Standing - Comment/# of Minutes: +2 (A) with bilateral HHA to maintain balance while pt needed total (A) for pericare care;  Pt with forward flexed posture leaning heavy on UE support  End of Session PT - End of Session Activity Tolerance: Patient limited by fatigue Patient left: in chair;with call bell/phone within reach Nurse Communication: Mobility status  GP     Nishika Parkhurst 05/06/2013, 4:38 PM   Jake Shark, PT DPT [phone]

## 2013-05-07 LAB — GLUCOSE, CAPILLARY
Glucose-Capillary: 223 mg/dL — ABNORMAL HIGH (ref 70–99)
Glucose-Capillary: 215 mg/dL — ABNORMAL HIGH (ref 70–99)

## 2013-05-07 MED ORDER — WARFARIN SODIUM 5 MG PO TABS: 7.5000 mg | ORAL_TABLET | Freq: Every day | ORAL | Status: DC

## 2013-05-07 MED ORDER — WARFARIN SODIUM 7.5 MG PO TABS
7.5000 mg | ORAL_TABLET | Freq: Once | ORAL | Status: DC
  Filled 2013-05-07: qty 1

## 2013-05-07 MED ORDER — CIPROFLOXACIN HCL 250 MG PO TABS: 250.0000 mg | ORAL_TABLET | Freq: Two times a day (BID) | ORAL | Status: DC

## 2013-05-07 NOTE — Discharge Summary (Signed)
Pt d/c to home with daughter. Iv d/c'd, belongings with pt. S/c summary completed and copy given to daughter. Pt and daughter verbalize understanding of all instructions. F/u appt made for Friday.

## 2013-05-07 NOTE — Discharge Summary (Signed)
Physician Discharge Summary  Sara Dennis UJW:[id number] DOB: Aug 25, 1922 DOA: 05/03/2013  PCP: Theodoro Kos, MD  Admit date: 05/03/2013 Discharge date: 05/07/2013  Time spent: Greater than 30 minutes  Recommendations for Outpatient Follow-up:  1. Dr. Early Chars in 3 days with repeat labs (CBC, BMP, TSH, PT & INR). 2. Patient declined Home health PT services. 3. Trimethoprim DCed- follow with PCP to see if needs to be resumed after completing current antibiotics. 4. Review BMP at PCP outpatient visit and decide regarding continuing or stopping metformin.   Discharge Diagnoses:  Principal Problem:   UTI (urinary tract infection) Active Problems:   Leukocytosis, unspecified   Hypotension   Diabetes mellitus   Atrial flutter   Discharge Condition: Improved & Stable  Diet recommendation: Heart Healthy & Diabetic.  Filed Weights   05/04/13 2031 05/05/13 2205 05/06/13 2144  Weight: 110.201 kg (242 lb 15.2 oz) 105.733 kg (233 lb 1.6 oz) 105.325 kg (232 lb 3.2 oz)    History of present illness:  79 year old female patient with history of DM 2, chronic CHF, pulmonary embolism, urinary tract infection was admitted to the hospital on 05/04/13 with complaints of generalized weakness, chills, rigors and dyspnea. Symptoms apparently started 30 minutes PTA. Associated with one episode of vomiting. Denied dysuria. In the ED, UA positive for features of UTI, initially hypotensive 92/70 which improved subsequently. Chest x-ray was suggestive of mild pulmonary edema and VQ scan was low probability for PE.  Hospital Course:   Sepsis, present on admission  - Likely secondary to urinary tract infection  - Patient was hypotensive and had leukocytosis on admission  - Hypotension resolved.  - Change IV Rocephin to Cipro to complete total 7 days Rx. Trimethoprim DCed - Blood cultures negative.  Enterobacter cloaca Urinary tract infection  - Patient was initially placed on IV Rocephin  -  Improved. Will change to oral Cipro and complete total 7 days course of antibiotics.   Leukocytosis  - Secondary to sepsis/UTI.  - Improving.  - ? Chronic- seen in May too. OP follow up.   Dyspnea/mild acute on chronic diastolic CHF/chronic interstitial lung disease.  - Resumed home Lasix 40 mg daily.  -VQ scan: Low probability for PE  - 2-D echo 01/31/13: Moderate LVH. LVEF 55-60%.  - Atrial flutter on EKG (reviewed with [facility] cardiology)  - Patient has chronic interstitial lung disease by CT 03/23/11. This may be contributing to intermittent dyspnea.  Atrial flutter (new), with controlled ventricular rate  -Repeat EKG- as above.  - INR: 1.71. Continue aspirin.  - Will discharge on reduced dose of Coumadin that that she was PTA, because of Cipro and potential for coagulopathy.  - Check TSH OP.  Hypothyroidism  - Continue Synthroid.   Uncontrolled type II DM  - Initially she was placed on a reduced dose of Lantus suspecting poor oral intake from acute illness. Her blood sugar started increasing and her Lantus was increased back to home dose. - Metformin was held in hospital which will be resumed on discharge. Close followup during outpatient visit and if creatinine continues to rise about 1.4, recommend discontinuing metformin and considering alternate agents.   Stage III chronic kidney disease  - Creatinine on 5/9:1.12.  - Creatinine has been in the 1.36-1.47 range since admission-? Stage III chronic kidney disease. It also may be secondary to diuretics.  - Stable   Microcytic anemia  - Stable  - Outpatient workup as deemed necessary.   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 Starting to rise. Management as above.   Procedures:  None   Consultations:  None  Discharge Exam:  Complaints: Denies complaints and eager to go home. Denies dyspnea.  Filed Vitals:   05/06/13 1711 05/06/13 2144 05/07/13 0515 05/07/13 0916  BP: 130/68  140/65 149/55 131/60  Pulse:  62 66 67  Temp: 97.3 F (36.3 C) 98.3 F (36.8 C) 98.4 F (36.9 C) 98.3 F (36.8 C)  TempSrc: Oral Oral Oral Oral  Resp: 20 18 28 19   Height:      Weight:  105.325 kg (232 lb 3.2 oz)    SpO2: 95% 96% 96% 98%    General exam: Comfortable.  Respiratory system: reduced breath sounds in the bases with few coarse velcro like chronic sounding basal crackles . Rest clear to auscultation. No increased work of breathing.  Cardiovascular system: S1 & S2 heard, RRR. No JVD, murmurs, gallops, clicks or pedal edema.  Gastrointestinal system: Abdomen is nondistended, soft and nontender. Normal bowel sounds heard.  Central nervous system: Alert and oriented. No focal neurological deficits.  Extremities: Symmetric 5 x 5 power.   Discharge Instructions      Discharge Orders   Future Orders Complete By Expires     (HEART FAILURE PATIENTS) Call MD:  Anytime you have any of the following symptoms: 1) 3 pound weight gain in 24 hours or 5 pounds in 1 week 2) shortness of breath, with or without a dry hacking cough 3) swelling in the hands, feet or stomach 4) if you have to sleep on extra pillows at night in order to breathe.  As directed     Call MD for:  difficulty breathing, headache or visual disturbances  As directed     Call MD for:  extreme fatigue  As directed     Call MD for:  persistant dizziness or light-headedness  As directed     Call MD for:  temperature >100.4  As directed     Diet - low sodium heart healthy  As directed     Diet Carb Modified  As directed     Increase activity slowly  As directed         Medication List    STOP taking these medications       potassium chloride SA 20 MEQ tablet  Commonly known as:  K-DUR,KLOR-CON     trimethoprim 100 MG tablet  Commonly known as:  TRIMPEX      TAKE these medications       aspirin 325 MG tablet  Take 325 mg by mouth every morning.     beta carotene w/minerals tablet  Take 1 tablet by mouth  daily.     ciprofloxacin 250 MG tablet  Commonly known as:  CIPRO  Take 1 tablet (250 mg total) by mouth 2 (two) times daily.     donepezil 5 MG tablet  Commonly known as:  ARICEPT  Take 5 mg by mouth every morning.     furosemide 40 MG tablet  Commonly known as:  LASIX  Take 40 mg by mouth every morning.     insulin glargine 100 UNIT/ML injection  Commonly known as:  LANTUS  Inject 35 Units into the skin 2 (two) times daily.     levothyroxine 50 MCG tablet  Commonly known as:  SYNTHROID, LEVOTHROID  Take 50 mcg by mouth daily before breakfast.     meloxicam 15 MG tablet  Commonly known as:  MOBIC  Take 15 mg by mouth daily.     metFORMIN 500 MG (MOD) 24 hr tablet  Commonly known as:  GLUMETZA  Take 500 mg by mouth every evening.     warfarin 5 MG tablet  Commonly known as:  COUMADIN  Take 1.5 tablets (7.5 mg total) by mouth daily.       Follow-up Information   Follow up with LEWIS,WILLIAM B, MD. Schedule an appointment as soon as possible for a visit in 3 days. (To be seen with repeat labs (CBC, BMP, PT & INR).)    Contact information:   [address] [place] [address] [phone]        The results of significant diagnostics from this hospitalization (including imaging, microbiology, ancillary and laboratory) are listed below for reference.    Significant Diagnostic Studies: Nm Pulmonary Perf And Vent  05/03/2013   *RADIOLOGY REPORT*  Clinical Data:  Acute onset of dyspnea.  NUCLEAR MEDICINE VENTILATION - PERFUSION LUNG SCAN  Technique:  Ventilation images were obtained in multiple projections using inhaled aerosol technetium 99 M DTPA.  Perfusion images were obtained in multiple projections after intravenous injection of Tc-28m MAA.  Radiopharmaceuticals:  Tc-106m DTPA aerosol and six mCi Tc-80m MAA.  Comparison: Plain film of the chest 05/03/2013.  Findings:  Ventilation:   No focal ventilation defect.  Perfusion:   No wedge shaped peripheral  perfusion defects to suggest acute pulmonary embolism.  Radiotracer distribution is mildly heterogeneous.  IMPRESSION: Low probability for pulmonary embolus.   Original Report Authenticated By: Holley Dexter, M.D.   Dg Chest Portable 1 View  05/03/2013   *RADIOLOGY REPORT*  Clinical Data: Weakness  PORTABLE CHEST - 1 VIEW  Comparison: Jan 30, 2013  Findings: Degree of inspiration is shallow.  There is mild interstitial prominence which is probably due to mild edema superimposed on shallow inspiration.  Heart is borderline prominent with normal pulmonary vascularity.  No adenopathy.  There is arthropathy in the left shoulder, stable.  IMPRESSION: Suspect a degree of mild congestive heart failure, stable.  No consolidation.   Original Report Authenticated By: Bretta Bang, M.D.    Microbiology: Recent Results (from the past 240 hour(s))  URINE CULTURE     Status: None   Collection Time    05/03/13  5:11 PM      Result Value Range Status   Specimen Description URINE, CLEAN CATCH   Final   Special Requests NONE   Final   Culture  Setup Time     Final   Value: 05/03/2013 19:01     Performed at [facility] Count     Final   Value: >=100,000 COLONIES/ML     Performed at [facility]   Culture     Final   Value: ENTEROBACTER CLOACAE     Performed at [facility]   Report Status 05/05/2013 FINAL   Final   Organism ID, Bacteria ENTEROBACTER CLOACAE   Final     Labs: Basic Metabolic Panel:  Recent Labs Lab 05/03/13 1518 05/04/13 0520 05/05/13 0600 05/06/13 0435  NA 140 136 140 138  K 4.5 4.7 4.7 4.3  CL 101 101 104 102  CO2 29 24 25 26   GLUCOSE 126* 216* 194* 258*  BUN 27* 28* 27* 28*  CREATININE 1.36* 1.47* 1.39* 1.36*  CALCIUM 9.4 8.5 9.0 9.1   Liver Function Tests: No results found for this basename: AST, ALT, ALKPHOS, BILITOT, PROT, ALBUMIN,  in the  last 168 hours No results found for this basename: LIPASE, AMYLASE,  in the last 168  hours No results found for this basename: AMMONIA,  in the last 168 hours CBC:  Recent Labs Lab 05/03/13 1518 05/04/13 0520 05/05/13 0600 05/06/13 0435  WBC 15.8* 19.4* 16.4* 15.5*  NEUTROABS 13.2*  --   --   --   HGB 13.4 11.7* 11.6* 12.2  HCT 39.2 35.5* 35.2* 35.6*  MCV 77.5* 77.9* 77.9* 77.4*  PLT 275 234 239 249   Cardiac Enzymes: No results found for this basename: CKTOTAL, CKMB, CKMBINDEX, TROPONINI,  in the last 168 hours BNP: BNP (last 3 results)  Recent Labs  01/30/13 0239 05/03/13 1518  PROBNP 1504.0* 448.4   CBG:  Recent Labs Lab 05/06/13 1153 05/06/13 1615 05/06/13 2135 05/07/13 0744 05/07/13 1206  GLUCAP 284* 337* 236* 223* 215*    Additional labs:    Signed:  Trenice Mesa  [facility] 05/07/2013, 2:44 PM

## 2013-05-07 NOTE — Progress Notes (Signed)
ANTICOAGULATION CONSULT NOTE - Follow Up Consult  Pharmacy Consult for Warfarin Indication: Hx PE and new Aflutter this admission  Allergies  Allergen Reactions  . Iohexol   . Ivp Dye (Iodinated Diagnostic Agents)     itching  . Bactrim Itching and Rash    Patient Measurements: Height: 5\' 4"  (162.6 cm) Weight: 232 lb 3.2 oz (105.325 kg) IBW/kg (Calculated) : 54.7  Vital Signs: Temp: 98.3 F (36.8 C) (08/11 0916) Temp src: Oral (08/11 0916) BP: 131/60 mmHg (08/11 0916) Pulse Rate: 67 (08/11 0916)  Labs:  Recent Labs  05/05/13 0600 05/05/13 1030 05/06/13 0435 05/07/13 0545  HGB 11.6*  --  12.2  --   HCT 35.2*  --  35.6*  --   PLT 239  --  249  --   LABPROT  --  18.3* 17.6* 19.6*  INR  --  1.57* 1.49 1.71*  CREATININE 1.39*  --  1.36*  --     Estimated Creatinine Clearance: 33.8 ml/min (by C-G formula based on Cr of 1.36).   Assessment: 79 y.o. F who continues on warfarin from PTA for hx PE and new Aflutter this admission. INR this morning remains SUBtherapeutic however is trending up nicely (INR 1.71 << 1.49, goal of 2-3). The patient was started on Cipro on 8/10 which has been known to increase warfarin sensitivity. No CBC today, no overt s/sx of bleeding noted.  It is noted that the patient is to be discharged home today. Given the concurrent use of Cipro -- would recommend 7.5 mg daily with a repeat PT/INR check as an outpatient on either Thurs, 8/14 or Fri, 8/15. The patient was re-educated on warfarin today.  Goal of Therapy:  INR 2-3  Plan:  1. Warfarin 7.5 mg x 1 dose at 1800 today 2. Would recommend warfarin 7.5 mg daily upon discharge (while concurrently on Cipro) with a follow-up PT/INR check on Fri, 8/15 3. Will continue to monitor for any signs/symptoms of bleeding and will follow up with PT/INR in the a.m.   Georgina Pillion, PharmD, BCPS Clinical Pharmacist Pager: [phone] 05/07/2013 10:40 AM

## 2013-05-07 NOTE — Progress Notes (Signed)
Physical Therapy Treatment Patient Details Name: TEMISHA MURLEY MRN: [id number] DOB: [birth year] Today's Date: 05/07/2013 Time: 4098-1191 PT Time Calculation (min): 25 min  PT Assessment / Plan / Recommendation  History of Present Illness TEMIA DEBROUX is a 79 y.o. female who presents to the ED with rigors.  She admits to severe chills, diffuse weakness, and some SOB too.  All of this started about 30 min PTA in the ED, and she had 1 episode of vomiting in the ED.  She does not have dysuria, but work up in the ED demonstrates UTI.  She is started on rocephin and hospitalist has been asked to admit.   PT Comments   Pt able to tolerate ambulation in hallway.  Pt initially required min assist for transfers however did better with verbal cues for using armrests.  Pt reports d/c home today and agreeable to have assist with mobility and use RW for safety upon d/c.   Follow Up Recommendations  Home health PT;Supervision/Assistance - 24 hour     Does the patient have the potential to tolerate intense rehabilitation     Barriers to Discharge        Equipment Recommendations  None recommended by PT    Recommendations for Other Services    Frequency     Progress towards PT Goals Progress towards PT goals: Progressing toward goals  Plan Current plan remains appropriate    Precautions / Restrictions Precautions Precautions: Fall   Pertinent Vitals/Pain n/a   Mobility  Bed Mobility Bed Mobility: Supine to Sit;Sit to Supine Supine to Sit: 5: Supervision;With rails Sit to Supine: 5: Supervision Details for Bed Mobility Assistance: increased time but no assist required Transfers Transfers: Sit to Stand;Stand to Sit Sit to Stand: 4: Min assist;With upper extremity assist;From bed;From chair/3-in-1 Stand to Sit: 4: Min assist;With upper extremity assist;To bed;To chair/3-in-1 Details for Transfer Assistance: verbal cues for safe technique, requires less assist with use of armrests, frequent  cues to remember armrests Ambulation/Gait Ambulation/Gait Assistance: 4: Min guard Ambulation Distance (Feet): 70 Feet (total) Assistive device: Rolling walker Ambulation/Gait Assistance Details: verbal cues for posture and safe RW distance, 15'x1, 20'x1, 35'x1 with seated rest breaks Gait Pattern: Step-through pattern;Decreased stride length;Trunk flexed Gait velocity: decreased    Exercises     PT Diagnosis:    PT Problem List:   PT Treatment Interventions:     PT Goals (current goals can now be found in the care plan section)    Visit Information  Last PT Received On: 05/07/13 Assistance Needed: +2 (chair following) History of Present Illness: ZULY BELKIN is a 79 y.o. female who presents to the ED with rigors.  She admits to severe chills, diffuse weakness, and some SOB too.  All of this started about 30 min PTA in the ED, and she had 1 episode of vomiting in the ED.  She does not have dysuria, but work up in the ED demonstrates UTI.  She is started on rocephin and hospitalist has been asked to admit.    Subjective Data      Cognition  Cognition Arousal/Alertness: Awake/alert Behavior During Therapy: WFL for tasks assessed/performed Overall Cognitive Status: Within Functional Limits for tasks assessed    Balance     End of Session PT - End of Session Equipment Utilized During Treatment: Gait belt Activity Tolerance: Patient limited by fatigue Patient left: in bed;with call bell/phone within reach;with bed alarm set Nurse Communication: Mobility status   GP  Dorea Duff,KATHrine E 05/07/2013, 3:35 PM Zenovia Jarred, PT, DPT 05/07/2013 Pager: [phone]

## 2013-05-07 NOTE — Progress Notes (Signed)
   CARE MANAGEMENT NOTE 05/07/2013  Patient:  SHANIYAH, WIX   Account Number:  [id number]  Date Initiated:  05/04/2013  Documentation initiated by:  Darlyne Russian  Subjective/Objective Assessment:   Patient admitted with UTI.     Action/Plan:   Progression of care and discharge planning   Anticipated DC Date:     Anticipated DC Plan:           Choice offered to / List presented to:             Status of service:  In process, will continue to follow Medicare Important Message given?   (If response is "NO", the following Medicare IM given date fields will be blank) Date Medicare IM given:   Date Additional Medicare IM given:    Discharge Disposition:    Per UR Regulation:  Reviewed for med. necessity/level of care/duration of stay  If discussed at Long Length of Stay Meetings, dates discussed:    Comments:  05/07/2013  [address] RN, [place]   [phone]  Met with patient to discuss discharge planning. She lives at home three daughters and grandchildren. She noted the family is always around. Talked about home health services upon discharge. She declines home health services,  all of my family is around me and help me.

## 2013-05-08 LAB — GLUCOSE, CAPILLARY: Glucose-Capillary: 212 mg/dL — ABNORMAL HIGH (ref 70–99)

## 2013-08-14 ENCOUNTER — Encounter (HOSPITAL_COMMUNITY): Payer: Self-pay | Admitting: Emergency Medicine

## 2013-08-14 ENCOUNTER — Emergency Department (HOSPITAL_COMMUNITY): Payer: Medicare Other

## 2013-08-14 ENCOUNTER — Emergency Department (HOSPITAL_COMMUNITY)
Admission: EM | Admit: 2013-08-14 | Discharge: 2013-08-14 | Disposition: A | Discharge status: Home / Self Care | Payer: Medicare Other | Attending: Emergency Medicine | Admitting: Emergency Medicine

## 2013-08-14 DIAGNOSIS — N390 Urinary tract infection, site not specified: Secondary | ICD-10-CM | POA: Insufficient documentation

## 2013-08-14 DIAGNOSIS — R059 Cough, unspecified: Secondary | ICD-10-CM | POA: Insufficient documentation

## 2013-08-14 DIAGNOSIS — Z794 Long term (current) use of insulin: Secondary | ICD-10-CM | POA: Insufficient documentation

## 2013-08-14 DIAGNOSIS — Z86711 Personal history of pulmonary embolism: Secondary | ICD-10-CM | POA: Insufficient documentation

## 2013-08-14 DIAGNOSIS — Z791 Long term (current) use of non-steroidal anti-inflammatories (NSAID): Secondary | ICD-10-CM | POA: Insufficient documentation

## 2013-08-14 DIAGNOSIS — E119 Type 2 diabetes mellitus without complications: Secondary | ICD-10-CM | POA: Insufficient documentation

## 2013-08-14 DIAGNOSIS — Z792 Long term (current) use of antibiotics: Secondary | ICD-10-CM | POA: Insufficient documentation

## 2013-08-14 DIAGNOSIS — R05 Cough: Secondary | ICD-10-CM | POA: Insufficient documentation

## 2013-08-14 DIAGNOSIS — M1710 Unilateral primary osteoarthritis, unspecified knee: Secondary | ICD-10-CM | POA: Insufficient documentation

## 2013-08-14 DIAGNOSIS — R509 Fever, unspecified: Secondary | ICD-10-CM | POA: Insufficient documentation

## 2013-08-14 DIAGNOSIS — Z7982 Long term (current) use of aspirin: Secondary | ICD-10-CM | POA: Insufficient documentation

## 2013-08-14 DIAGNOSIS — Z79899 Other long term (current) drug therapy: Secondary | ICD-10-CM | POA: Insufficient documentation

## 2013-08-14 DIAGNOSIS — I509 Heart failure, unspecified: Secondary | ICD-10-CM | POA: Insufficient documentation

## 2013-08-14 DIAGNOSIS — Z7901 Long term (current) use of anticoagulants: Secondary | ICD-10-CM | POA: Insufficient documentation

## 2013-08-14 LAB — URINALYSIS, ROUTINE W REFLEX MICROSCOPIC
Bilirubin Urine: NEGATIVE
Ketones, ur: NEGATIVE mg/dL
Nitrite: NEGATIVE
Specific Gravity, Urine: 1.024 (ref 1.005–1.030)
Urobilinogen, UA: 0.2 mg/dL (ref 0.0–1.0)
pH: 5 (ref 5.0–8.0)

## 2013-08-14 LAB — CBC WITH DIFFERENTIAL/PLATELET
Basophils Absolute: 0 10*3/uL (ref 0.0–0.1)
Eosinophils Absolute: 0 10*3/uL (ref 0.0–0.7)
Lymphocytes Relative: 5 % — ABNORMAL LOW (ref 12–46)
MCH: 25.5 pg — ABNORMAL LOW (ref 26.0–34.0)
MCHC: 33.7 g/dL (ref 30.0–36.0)
Monocytes Absolute: 0.8 10*3/uL (ref 0.1–1.0)
Neutrophils Relative %: 92 % — ABNORMAL HIGH (ref 43–77)
Platelets: 277 10*3/uL (ref 150–400)

## 2013-08-14 LAB — COMPREHENSIVE METABOLIC PANEL
ALT: 10 U/L (ref 0–35)
AST: 19 U/L (ref 0–37)
Albumin: 3.7 g/dL (ref 3.5–5.2)
Calcium: 9.5 mg/dL (ref 8.4–10.5)
GFR calc Af Amer: 38 mL/min — ABNORMAL LOW (ref >90)
Potassium: 4.3 mEq/L (ref 3.5–5.1)
Sodium: 136 mEq/L (ref 135–145)
Total Protein: 7.8 g/dL (ref 6.0–8.3)

## 2013-08-14 LAB — URINE MICROSCOPIC-ADD ON

## 2013-08-14 MED ORDER — ACETAMINOPHEN 325 MG PO TABS
650.0000 mg | ORAL_TABLET | Freq: Once | ORAL | Status: AC
  Administered 2013-08-14: 650 mg via ORAL
  Filled 2013-08-14: qty 2

## 2013-08-14 MED ORDER — CIPROFLOXACIN HCL 500 MG PO TABS: 500.0000 mg | ORAL_TABLET | Freq: Two times a day (BID) | ORAL | Status: DC

## 2013-08-14 MED ORDER — SODIUM CHLORIDE 0.9 % IV BOLUS (SEPSIS)
1000.0000 mL | Freq: Once | INTRAVENOUS | Status: AC
  Administered 2013-08-14: 1000 mL via INTRAVENOUS

## 2013-08-14 MED ORDER — CIPROFLOXACIN HCL 500 MG PO TABS
500.0000 mg | ORAL_TABLET | Freq: Once | ORAL | Status: AC
  Administered 2013-08-14: 500 mg via ORAL
  Filled 2013-08-14: qty 1

## 2013-08-14 NOTE — ED Notes (Signed)
Pt arrives by EMS-lives at home with family-UTI symptoms for the past 3 days and nausea and vomiting x 1 since 1500 this afternoon

## 2013-08-14 NOTE — ED Provider Notes (Addendum)
CSN: [id number]     Arrival date & time 08/14/13  2105 History   First MD Initiated Contact with Patient 08/14/13 2124     Chief Complaint  Patient presents with  . nausea and vomiting   (Consider location/radiation/quality/duration/timing/severity/associated sxs/prior Treatment) HPI Comments: Pt presenting with foul smelling urine for the last 1 week and then fever/chills today with 1 episode of vomiting.  Pt denies any pain or discomfort at this time but does admit to frequent urination.  Currently no nausea or vomiting.  Denies diarrhea.  Last UTI was about 4 or 5 months ago and since that time she has been on trimethoprim for ppx. Caregiver states that after vomiting episode today she was coughing repeatedly which has improved but still a mild cough.  Pt denies CP or SOB at this time.  The history is provided by the patient and a caregiver.    Past Medical History  Diagnosis Date  . Diabetes mellitus   . Shortness of breath     with activity  . CHF (congestive heart failure) may 2012  . Pulmonary embolus 03-23-11  . Arthritis     left leg  . UTI (lower urinary tract infection)    Past Surgical History  Procedure Laterality Date  . Kisney stone removal  1978    removed thru bladder  . Cystoscopy w/ ureteral stent placement  09/06/2011    Procedure: CYSTOSCOPY WITH RETROGRADE PYELOGRAM/URETERAL STENT PLACEMENT;  Surgeon: Valetta Fuller, MD;  Location: [facility];  Service: Urology;  Laterality: Right;  cysto double j stent placement  . Ureteroscopy  09/06/2011    Procedure: URETEROSCOPY;  Surgeon: Valetta Fuller, MD;  Location: [facility];  Service: Urology;  Laterality: Right;   No family history on file. History  Substance Use Topics  . Smoking status: Never Smoker   . Smokeless tobacco: Never Used  . Alcohol Use: No   OB History   Grav Para Term Preterm Abortions TAB SAB Ect Mult Living                 Review of Systems  Constitutional: Positive for fever and chills.   Respiratory: Positive for cough. Negative for chest tightness and shortness of breath.   Gastrointestinal: Positive for nausea and vomiting. Negative for abdominal pain and diarrhea.  Genitourinary: Positive for frequency. Negative for dysuria and flank pain.  All other systems reviewed and are negative.    Allergies  Iohexol; Ivp dye; and Bactrim  Home Medications   Current Outpatient Rx  Name  Route  Sig  Dispense  Refill  . aspirin 325 MG tablet   Oral   Take 325 mg by mouth every morning.           . beta carotene w/minerals (OCUVITE) tablet   Oral   Take 1 tablet by mouth daily.           . ciprofloxacin (CIPRO) 250 MG tablet   Oral   Take 1 tablet (250 mg total) by mouth 2 (two) times daily.   5 tablet   0   . donepezil (ARICEPT) 5 MG tablet   Oral   Take 5 mg by mouth every morning.          . furosemide (LASIX) 40 MG tablet   Oral   Take 40 mg by mouth every morning.          . insulin glargine (LANTUS) 100 UNIT/ML injection   Subcutaneous   Inject 35 Units into  the skin 2 (two) times daily.          Marland Kitchen levothyroxine (SYNTHROID, LEVOTHROID) 50 MCG tablet   Oral   Take 50 mcg by mouth daily before breakfast.         . meloxicam (MOBIC) 15 MG tablet   Oral   Take 15 mg by mouth daily.         . metFORMIN (GLUMETZA) 500 MG (MOD) 24 hr tablet   Oral   Take 500 mg by mouth every evening.         . warfarin (COUMADIN) 5 MG tablet   Oral   Take 1.5 tablets (7.5 mg total) by mouth daily.          There were no vitals taken for this visit. Physical Exam  Nursing note and vitals reviewed. Constitutional: She is oriented to person, place, and time. She appears well-developed and well-nourished. No distress.  HENT:  Head: Normocephalic and atraumatic.  Mouth/Throat: Oropharynx is clear and moist.  Eyes: Conjunctivae and EOM are normal. Pupils are equal, round, and reactive to light.  Neck: Normal range of motion. Neck supple.   Cardiovascular: Normal rate, regular rhythm and intact distal pulses.   No murmur heard. Pulmonary/Chest: Effort normal and breath sounds normal. No respiratory distress. She has no wheezes. She has no rales.  Abdominal: Soft. She exhibits no distension. There is no tenderness. There is no rebound and no guarding.  Musculoskeletal: Normal range of motion. She exhibits no edema and no tenderness.  Neurological: She is alert and oriented to person, place, and time.  Skin: Skin is warm and dry. No rash noted. No erythema.  Psychiatric: She has a normal mood and affect. Her behavior is normal.    ED Course  Procedures (including critical care time) Labs Review Labs Reviewed  URINALYSIS, ROUTINE W REFLEX MICROSCOPIC - Abnormal; Notable for the following:    APPearance TURBID (*)    Hgb urine dipstick MODERATE (*)    Leukocytes, UA LARGE (*)    All other components within normal limits  CBC WITH DIFFERENTIAL - Abnormal; Notable for the following:    WBC 25.5 (*)    MCV 75.7 (*)    MCH 25.5 (*)    RDW 15.8 (*)    Neutrophils Relative % 92 (*)    Lymphocytes Relative 5 (*)    Neutro Abs 23.4 (*)    All other components within normal limits  COMPREHENSIVE METABOLIC PANEL - Abnormal; Notable for the following:    Glucose, Bld 259 (*)    BUN 25 (*)    Creatinine, Ser 1.39 (*)    GFR calc non Af Amer 33 (*)    GFR calc Af Amer 38 (*)    All other components within normal limits  URINE MICROSCOPIC-ADD ON - Abnormal; Notable for the following:    Bacteria, UA MANY (*)    All other components within normal limits  URINE CULTURE   Imaging Review Dg Chest 2 View  08/14/2013   CLINICAL DATA:  Cough and weakness, possible aspiration.  EXAM: CHEST  2 VIEW  COMPARISON:  Chest radiograph May 03, 2013  FINDINGS: Cardiac silhouette remains moderately enlarged, even with consideration to this low inspiratory portable examination. Persistently elevated right hemidiaphragm. Interstitial  prominence with central pulmonary vasculature congestion. Biapical pleural capping. Similar bibasilar strandy densities. No pneumothorax.  Severe degenerative change of left shoulder with calcifications inferior to the glenohumeral joint space which could reflect osteophyte or loose bodies.  IMPRESSION: Similar appearance of the chest: Cardiomegaly, chronic interstitial changes with central pulmonary vasculature congestion. Bibasilar strandy densities favor atelectasis.   Electronically Signed   By: Awilda Metro   On: 08/14/2013 22:03    EKG Interpretation   None       MDM   1. UTI (lower urinary tract infection)     Patient presented from home with symptoms concerning for developing UTI. Patient has a history of recurrent UTIs and takes trimethoprim as prophylaxis. For the last one week she has had foul-smelling urine and then today had chills, fever and one episode of vomiting. After the episode of vomiting she had recurrent coughing and family members were worried she could have aspirated. Patient currently has no complaints and states she feels okay denying any nausea. She is hemodynamically stable at this time however she does appear mildly dehydrated.  Prior hx of KS but no hx concerning for stone today.  Denies any pain at any time.  CBC, CMP, UA, chest x-ray pending. Patient given IV fluids and Tylenol.  Of note patient is on Coumadin which was checked today it was 1.75  10:55 PM Leukocytosis and UTI present.  Last culture sensitive to cipro.  Will start cipro.  Pt having coumadin checked next week.    Gwyneth Sprout, MD 08/14/13 1610  Gwyneth Sprout, MD 08/14/13 [phone]

## 2013-08-14 NOTE — ED Notes (Signed)
Pt is A&O x3. Pt denies pain.  Daughter at bedside. +nausea, vomit x 1.  Pt lives at home w/ daughter.

## 2013-08-14 NOTE — ED Notes (Signed)
Bed: WA02 Expected date:  Expected time:  Means of arrival:  Comments: EMS/79yo with N/V-possible UTI

## 2013-08-16 LAB — URINE CULTURE: Colony Count: >=100000

## 2013-08-17 ENCOUNTER — Telehealth (HOSPITAL_COMMUNITY): Payer: Self-pay | Admitting: Emergency Medicine

## 2013-08-17 NOTE — ED Notes (Signed)
Post ED Visit - Positive Culture Follow-up ° °Culture report reviewed by antimicrobial stewardship pharmacist: °[] Wes Dulaney, Pharm.D., BCPS °[] Jeremy Frens, Pharm.D., BCPS °[] Elizabeth Martin, Pharm.D., BCPS °[] Minh Pham, Pharm.D., BCPS, AAHIVP °[x] Michelle Turner, Pharm.D., BCPS, AAHIVP ° °Positive urine culture °Treated with Cipro, organism sensitive to the same and no further patient follow-up is required at this time. ° °Sara Dennis °08/17/2013, 12:08 PM ° ° °

## 2014-11-11 ENCOUNTER — Encounter (HOSPITAL_COMMUNITY): Payer: Self-pay | Admitting: Emergency Medicine

## 2014-11-11 ENCOUNTER — Emergency Department (HOSPITAL_COMMUNITY)
Admission: EM | Admit: 2014-11-11 | Discharge: 2014-11-11 | Disposition: A | Discharge status: Home / Self Care | Payer: Medicare Other | Attending: Emergency Medicine | Admitting: Emergency Medicine

## 2014-11-11 DIAGNOSIS — Z79899 Other long term (current) drug therapy: Secondary | ICD-10-CM | POA: Insufficient documentation

## 2014-11-11 DIAGNOSIS — E119 Type 2 diabetes mellitus without complications: Secondary | ICD-10-CM | POA: Diagnosis not present

## 2014-11-11 DIAGNOSIS — M1990 Unspecified osteoarthritis, unspecified site: Secondary | ICD-10-CM | POA: Diagnosis not present

## 2014-11-11 DIAGNOSIS — Z86711 Personal history of pulmonary embolism: Secondary | ICD-10-CM | POA: Insufficient documentation

## 2014-11-11 DIAGNOSIS — I509 Heart failure, unspecified: Secondary | ICD-10-CM | POA: Diagnosis not present

## 2014-11-11 DIAGNOSIS — N390 Urinary tract infection, site not specified: Secondary | ICD-10-CM | POA: Diagnosis not present

## 2014-11-11 DIAGNOSIS — Z7952 Long term (current) use of systemic steroids: Secondary | ICD-10-CM | POA: Diagnosis not present

## 2014-11-11 DIAGNOSIS — Z7982 Long term (current) use of aspirin: Secondary | ICD-10-CM | POA: Insufficient documentation

## 2014-11-11 DIAGNOSIS — Z794 Long term (current) use of insulin: Secondary | ICD-10-CM | POA: Diagnosis not present

## 2014-11-11 DIAGNOSIS — R509 Fever, unspecified: Secondary | ICD-10-CM | POA: Diagnosis present

## 2014-11-11 DIAGNOSIS — Z791 Long term (current) use of non-steroidal anti-inflammatories (NSAID): Secondary | ICD-10-CM | POA: Insufficient documentation

## 2014-11-11 DIAGNOSIS — Z7901 Long term (current) use of anticoagulants: Secondary | ICD-10-CM | POA: Diagnosis not present

## 2014-11-11 DIAGNOSIS — E079 Disorder of thyroid, unspecified: Secondary | ICD-10-CM | POA: Insufficient documentation

## 2014-11-11 DIAGNOSIS — Z792 Long term (current) use of antibiotics: Secondary | ICD-10-CM | POA: Insufficient documentation

## 2014-11-11 LAB — URINALYSIS, ROUTINE W REFLEX MICROSCOPIC
Bilirubin Urine: NEGATIVE
GLUCOSE, UA: NEGATIVE mg/dL
Ketones, ur: NEGATIVE mg/dL
NITRITE: POSITIVE — AB
Protein, ur: 30 mg/dL — AB
SPECIFIC GRAVITY, URINE: 1.015 (ref 1.005–1.030)
UROBILINOGEN UA: 0.2 mg/dL (ref 0.0–1.0)
pH: 5.5 (ref 5.0–8.0)

## 2014-11-11 LAB — POTASSIUM: Potassium: 5.1 mmol/L (ref 3.5–5.1)

## 2014-11-11 LAB — BASIC METABOLIC PANEL
Anion gap: 9 (ref 5–15)
BUN: 28 mg/dL — AB (ref 6–23)
CALCIUM: 8.9 mg/dL (ref 8.4–10.5)
CO2: 24 mmol/L (ref 19–32)
Chloride: 104 mmol/L (ref 96–112)
Creatinine, Ser: 1.44 mg/dL — ABNORMAL HIGH (ref 0.50–1.10)
GFR calc Af Amer: 36 mL/min — ABNORMAL LOW (ref >90)
GFR, EST NON AFRICAN AMERICAN: 31 mL/min — AB (ref >90)
Glucose, Bld: 168 mg/dL — ABNORMAL HIGH (ref 70–99)
Potassium: 5.9 mmol/L — ABNORMAL HIGH (ref 3.5–5.1)
Sodium: 137 mmol/L (ref 135–145)

## 2014-11-11 LAB — CBC
HCT: 42.7 % (ref 36.0–46.0)
Hemoglobin: 14.2 g/dL (ref 12.0–15.0)
MCH: 26.9 pg (ref 26.0–34.0)
MCHC: 33.3 g/dL (ref 30.0–36.0)
MCV: 80.9 fL (ref 78.0–100.0)
PLATELETS: 282 10*3/uL (ref 150–400)
RBC: 5.28 MIL/uL — ABNORMAL HIGH (ref 3.87–5.11)
RDW: 15.1 % (ref 11.5–15.5)
WBC: 18.5 10*3/uL — ABNORMAL HIGH (ref 4.0–10.5)

## 2014-11-11 LAB — URINE MICROSCOPIC-ADD ON

## 2014-11-11 LAB — PROTIME-INR
INR: 1.61 — AB (ref 0.00–1.49)
PROTHROMBIN TIME: 19.3 s — AB (ref 11.6–15.2)

## 2014-11-11 MED ORDER — CEFPODOXIME PROXETIL 100 MG PO TABS: 100.0000 mg | ORAL_TABLET | Freq: Two times a day (BID) | ORAL | Status: DC

## 2014-11-11 MED ORDER — DEXTROSE 5 % IV SOLN
1.0000 g | Freq: Once | INTRAVENOUS | Status: AC
  Administered 2014-11-11: 1 g via INTRAVENOUS
  Filled 2014-11-11: qty 10

## 2014-11-11 NOTE — ED Notes (Signed)
Bed: XY58 Expected date: 11/11/14 Expected time: 8:09 PM Means of arrival: Ambulance Comments: 81 yr old, chills, fever

## 2014-11-11 NOTE — ED Notes (Signed)
Patient has felt weak and has had foul smelling, frequent urination since this morning at 4am. Alert and oriented per norm. Low grade fever.

## 2014-11-11 NOTE — ED Provider Notes (Signed)
CSN: [id number]     Arrival date & time 11/11/14  2032 History   First MD Initiated Contact with Patient 11/11/14 2116     Chief Complaint  Patient presents with  . Fever     (Consider location/radiation/quality/duration/timing/severity/associated sxs/prior Treatment) Patient is a 81 y.o. female presenting with fever. The history is provided by the patient.  Fever Associated symptoms: dysuria   Associated symptoms: no chest pain, no congestion, no cough, no diarrhea, no headaches, no nausea and no vomiting   Dysuria:    Severity:  Mild   Onset quality:  Sudden   Duration:  1 day   Timing:  Constant   Progression:  Unchanged   Chronicity:  New   Past Medical History  Diagnosis Date  . Diabetes mellitus   . Shortness of breath     with activity  . CHF (congestive heart failure) may 2012  . Pulmonary embolus 03-23-11  . Arthritis     left leg  . UTI (lower urinary tract infection)   . Thyroid disease    Past Surgical History  Procedure Laterality Date  . Kisney stone removal  1978    removed thru bladder  . Cystoscopy w/ ureteral stent placement  09/06/2011    Procedure: CYSTOSCOPY WITH RETROGRADE PYELOGRAM/URETERAL STENT PLACEMENT;  Surgeon: Valetta Fuller, MD;  Location: [facility];  Service: Urology;  Laterality: Right;  cysto double j stent placement  . Ureteroscopy  09/06/2011    Procedure: URETEROSCOPY;  Surgeon: Valetta Fuller, MD;  Location: [facility];  Service: Urology;  Laterality: Right;   History reviewed. No pertinent family history. History  Substance Use Topics  . Smoking status: Never Smoker   . Smokeless tobacco: Never Used  . Alcohol Use: No   OB History    No data available     Review of Systems  Constitutional: Positive for fever (low grade). Negative for fatigue.  HENT: Negative for congestion and drooling.   Eyes: Negative for pain.  Respiratory: Negative for cough and shortness of breath.   Cardiovascular: Negative for chest pain.   Gastrointestinal: Negative for nausea, vomiting, abdominal pain and diarrhea.  Genitourinary: Positive for dysuria. Negative for hematuria.  Musculoskeletal: Negative for back pain, gait problem and neck pain.  Skin: Negative for color change.  Neurological: Negative for dizziness and headaches.  Hematological: Negative for adenopathy.  Psychiatric/Behavioral: Negative for behavioral problems.  All other systems reviewed and are negative.     Allergies  Iohexol; Ivp dye; and Bactrim  Home Medications   Prior to Admission medications   Medication Sig Start Date End Date Taking? Authorizing Provider  aspirin 325 MG tablet Take 325 mg by mouth every morning.     Yes Historical Provider, MD  beta carotene w/minerals (OCUVITE) tablet Take 1 tablet by mouth daily.   Yes Historical Provider, MD  donepezil (ARICEPT) 5 MG tablet Take 5 mg by mouth every morning.    Yes Historical Provider, MD  furosemide (LASIX) 40 MG tablet Take 40 mg by mouth every morning.    Yes Historical Provider, MD  glipiZIDE (GLUCOTROL) 10 MG tablet Take 10 mg by mouth 2 (two) times daily before a meal.   Yes Historical Provider, MD  insulin glargine (LANTUS) 100 UNIT/ML injection Inject 35 Units into the skin 2 (two) times daily.    Yes Historical Provider, MD  levothyroxine (SYNTHROID, LEVOTHROID) 50 MCG tablet Take 50 mcg by mouth daily before breakfast.   Yes Historical Provider, MD  meloxicam (MOBIC)  15 MG tablet Take 15 mg by mouth daily.   Yes Historical Provider, MD  metFORMIN (GLUCOPHAGE-XR) 500 MG 24 hr tablet Take 500 mg by mouth at bedtime.    Yes Historical Provider, MD  neomycin-bacitracin-polymyxin (NEOSPORIN) ophthalmic ointment Place 1 application into the right eye at bedtime.   Yes Historical Provider, MD  PREDNISOLONE ACETATE OP Place 1 drop into the right eye 4 (four) times daily.   Yes Historical Provider, MD  TIMOLOL MALEATE OP Place 1 drop into the right eye 2 (two) times daily.   Yes  Historical Provider, MD  trimethoprim (TRIMPEX) 100 MG tablet Take 100 mg by mouth daily.   Yes Historical Provider, MD  warfarin (COUMADIN) 5 MG tablet Take 5 mg by mouth daily.   Yes Historical Provider, MD  ciprofloxacin (CIPRO) 500 MG tablet Take 1 tablet (500 mg total) by mouth every 12 (twelve) hours. Patient not taking: Reported on 11/11/2014 08/14/13   Gwyneth Sprout, MD   BP 118/58 mmHg  Pulse 73  Temp(Src) 97.9 F (36.6 C) (Oral)  SpO2 95% Physical Exam  Constitutional: She is oriented to person, place, and time. She appears well-developed and well-nourished.  HENT:  Head: Normocephalic.  Mouth/Throat: Oropharynx is clear and moist. No oropharyngeal exudate.  Eyes: Conjunctivae and EOM are normal. Pupils are equal, round, and reactive to light.  Neck: Normal range of motion. Neck supple.  Cardiovascular: Normal rate, regular rhythm, normal heart sounds and intact distal pulses.  Exam reveals no gallop and no friction rub.   No murmur heard. Pulmonary/Chest: Effort normal and breath sounds normal. No respiratory distress. She has no wheezes.  Abdominal: Soft. Bowel sounds are normal. There is no tenderness. There is no rebound and no guarding.  Musculoskeletal: Normal range of motion. She exhibits no edema or tenderness.  Neurological: She is alert and oriented to person, place, and time.  Skin: Skin is warm and dry.  Psychiatric: She has a normal mood and affect. Her behavior is normal.  Nursing note and vitals reviewed.   ED Course  Procedures (including critical care time) Labs Review Labs Reviewed  CBC - Abnormal; Notable for the following:    WBC 18.5 (*)    RBC 5.28 (*)    All other components within normal limits  BASIC METABOLIC PANEL - Abnormal; Notable for the following:    Potassium 5.9 (*)    Glucose, Bld 168 (*)    BUN 28 (*)    Creatinine, Ser 1.44 (*)    GFR calc non Af Amer 31 (*)    GFR calc Af Amer 36 (*)    All other components within normal  limits  URINALYSIS, ROUTINE W REFLEX MICROSCOPIC - Abnormal; Notable for the following:    APPearance TURBID (*)    Hgb urine dipstick MODERATE (*)    Protein, ur 30 (*)    Nitrite POSITIVE (*)    Leukocytes, UA LARGE (*)    All other components within normal limits  PROTIME-INR - Abnormal; Notable for the following:    Prothrombin Time 19.3 (*)    INR 1.61 (*)    All other components within normal limits  URINE CULTURE  POTASSIUM  URINE MICROSCOPIC-ADD ON    Imaging Review No results found.   EKG Interpretation   Date/Time:  Monday November 11 2014 20:58:58 EST Ventricular Rate:  70 PR Interval:  407 QRS Duration: 151 QT Interval:  440 QTC Calculation: 475 R Axis:   -62 Text Interpretation:  Sinus or  ectopic atrial rhythm Prolonged PR interval  Right bundle branch block LVH with IVCD and secondary repol abnrm  Confirmed by Jackilyn Umphlett  MD, Ashla Murph ([id number]) on 11/11/2014 9:40:54 PM      MDM   Final diagnoses:  UTI (lower urinary tract infection)    9:41 PM 81 y.o. female w hx of DM, CHF, PE on coumadin, UTI's  Who presents with dysuria which began yesterday evening. She states that she lives with her daughter noted her to have a low-grade fever today. She currently has no complaints on exam. Her vital signs are unremarkable here. We'll get screening labs and urinalysis.  11:08 PM:  The patient has a chronically elevated white blood cell count.  Potassium initially elevated but moderate hemolysis. Repeat potassium is within normal limits. Laboratory workup otherwise unremarkable except for UTI. Patient got Rocephin IV here. She continues to appear well and has no other complaints on exam. Her vital signs are unremarkable here. We'll treat with vantin at home, I discussed abx options w/ pharmacy as she is also on coumadin.  I have discussed the diagnosis/risks/treatment options with the patient and believe the pt to be eligible for discharge home to follow-up with her pcp. We also  discussed returning to the ED immediately if new or worsening sx occur. We discussed the sx which are most concerning (e.g., fever, vomiting, pain) that necessitate immediate return. Medications administered to the patient during their visit and any new prescriptions provided to the patient are listed below.  Medications given during this visit Medications  cefTRIAXone (ROCEPHIN) 1 g in dextrose 5 % 50 mL IVPB (0 g Intravenous Stopped 11/11/14 2242)    New Prescriptions   CEFPODOXIME (VANTIN) 100 MG TABLET    Take 1 tablet (100 mg total) by mouth 2 (two) times daily.     Purvis Sheffield, MD 11/11/14 2324

## 2014-11-15 LAB — URINE CULTURE: Colony Count: >=100000

## 2014-11-19 ENCOUNTER — Telehealth (HOSPITAL_COMMUNITY): Payer: Self-pay

## 2014-11-19 NOTE — Telephone Encounter (Signed)
Post ED Visit - Positive Culture Follow-up  Culture report reviewed by antimicrobial stewardship pharmacist: []  Wes Dulaney, Pharm.D., BCPS []  Celedonio Miyamoto, [address].D., BCPS []  Georgina Pillion, Pharm.D., BCPS []  [place], [address].D., BCPS, AAHIVP [x]  Estella Husk, Pharm.D., BCPS, AAHIVP []  Elder Cyphers, [address].D., BCPS  Positive Urine culture, >/= 100,000 colonies -> E Coli Treated with Cefpodoxime, organism sensitive to the same and no further patient follow-up is required at this time.  Arvid Right 11/19/2014, 5:07 AM

## 2015-06-11 ENCOUNTER — Inpatient Hospital Stay (HOSPITAL_COMMUNITY)
Admission: EM | Admit: 2015-06-11 | Discharge: 2015-06-14 | DRG: 308 | Disposition: A | Discharge status: Home Health | Payer: Medicare Other | Attending: Family Medicine | Admitting: Family Medicine

## 2015-06-11 ENCOUNTER — Emergency Department (HOSPITAL_COMMUNITY): Payer: Medicare Other

## 2015-06-11 ENCOUNTER — Encounter (HOSPITAL_COMMUNITY): Payer: Self-pay | Admitting: Physical Medicine and Rehabilitation

## 2015-06-11 DIAGNOSIS — R001 Bradycardia, unspecified: Secondary | ICD-10-CM | POA: Diagnosis present

## 2015-06-11 DIAGNOSIS — Z91041 Radiographic dye allergy status: Secondary | ICD-10-CM

## 2015-06-11 DIAGNOSIS — Z794 Long term (current) use of insulin: Secondary | ICD-10-CM

## 2015-06-11 DIAGNOSIS — I5033 Acute on chronic diastolic (congestive) heart failure: Secondary | ICD-10-CM | POA: Diagnosis present

## 2015-06-11 DIAGNOSIS — Z79899 Other long term (current) drug therapy: Secondary | ICD-10-CM

## 2015-06-11 DIAGNOSIS — Z7982 Long term (current) use of aspirin: Secondary | ICD-10-CM | POA: Diagnosis not present

## 2015-06-11 DIAGNOSIS — I248 Other forms of acute ischemic heart disease: Secondary | ICD-10-CM | POA: Diagnosis present

## 2015-06-11 DIAGNOSIS — F0390 Unspecified dementia without behavioral disturbance: Secondary | ICD-10-CM | POA: Diagnosis present

## 2015-06-11 DIAGNOSIS — I4510 Unspecified right bundle-branch block: Secondary | ICD-10-CM | POA: Diagnosis present

## 2015-06-11 DIAGNOSIS — E1122 Type 2 diabetes mellitus with diabetic chronic kidney disease: Secondary | ICD-10-CM | POA: Diagnosis present

## 2015-06-11 DIAGNOSIS — N183 Chronic kidney disease, stage 3 (moderate): Secondary | ICD-10-CM | POA: Diagnosis present

## 2015-06-11 DIAGNOSIS — H547 Unspecified visual loss: Secondary | ICD-10-CM | POA: Diagnosis present

## 2015-06-11 DIAGNOSIS — I209 Angina pectoris, unspecified: Secondary | ICD-10-CM | POA: Diagnosis present

## 2015-06-11 DIAGNOSIS — I4891 Unspecified atrial fibrillation: Secondary | ICD-10-CM | POA: Diagnosis present

## 2015-06-11 DIAGNOSIS — R3915 Urgency of urination: Secondary | ICD-10-CM | POA: Diagnosis present

## 2015-06-11 DIAGNOSIS — I4439 Other atrioventricular block: Secondary | ICD-10-CM | POA: Diagnosis present

## 2015-06-11 DIAGNOSIS — Z86711 Personal history of pulmonary embolism: Secondary | ICD-10-CM | POA: Diagnosis not present

## 2015-06-11 DIAGNOSIS — R112 Nausea with vomiting, unspecified: Secondary | ICD-10-CM | POA: Diagnosis present

## 2015-06-11 DIAGNOSIS — R079 Chest pain, unspecified: Secondary | ICD-10-CM | POA: Diagnosis present

## 2015-06-11 DIAGNOSIS — I272 Other secondary pulmonary hypertension: Secondary | ICD-10-CM | POA: Diagnosis present

## 2015-06-11 DIAGNOSIS — Z993 Dependence on wheelchair: Secondary | ICD-10-CM

## 2015-06-11 DIAGNOSIS — I080 Rheumatic disorders of both mitral and aortic valves: Secondary | ICD-10-CM | POA: Diagnosis present

## 2015-06-11 DIAGNOSIS — I482 Chronic atrial fibrillation: Secondary | ICD-10-CM | POA: Diagnosis present

## 2015-06-11 DIAGNOSIS — Z881 Allergy status to other antibiotic agents status: Secondary | ICD-10-CM

## 2015-06-11 DIAGNOSIS — I5032 Chronic diastolic (congestive) heart failure: Secondary | ICD-10-CM | POA: Diagnosis present

## 2015-06-11 DIAGNOSIS — R0600 Dyspnea, unspecified: Secondary | ICD-10-CM | POA: Diagnosis not present

## 2015-06-11 DIAGNOSIS — I501 Left ventricular failure: Secondary | ICD-10-CM | POA: Diagnosis not present

## 2015-06-11 DIAGNOSIS — N390 Urinary tract infection, site not specified: Secondary | ICD-10-CM | POA: Diagnosis present

## 2015-06-11 DIAGNOSIS — I208 Other forms of angina pectoris: Secondary | ICD-10-CM | POA: Diagnosis not present

## 2015-06-11 DIAGNOSIS — Z7901 Long term (current) use of anticoagulants: Secondary | ICD-10-CM

## 2015-06-11 DIAGNOSIS — I484 Atypical atrial flutter: Principal | ICD-10-CM | POA: Diagnosis present

## 2015-06-11 DIAGNOSIS — I2489 Other forms of acute ischemic heart disease: Secondary | ICD-10-CM | POA: Diagnosis present

## 2015-06-11 DIAGNOSIS — I4892 Unspecified atrial flutter: Secondary | ICD-10-CM | POA: Diagnosis present

## 2015-06-11 HISTORY — DX: Hypothyroidism, unspecified: E03.9

## 2015-06-11 HISTORY — DX: Permanent atrial fibrillation: I48.21

## 2015-06-11 LAB — I-STAT TROPONIN, ED: Troponin i, poc: 0.02 ng/mL (ref 0.00–0.08)

## 2015-06-11 LAB — CBC
HEMATOCRIT: 42.3 % (ref 36.0–46.0)
HEMOGLOBIN: 14.1 g/dL (ref 12.0–15.0)
MCH: 26.7 pg (ref 26.0–34.0)
MCHC: 33.3 g/dL (ref 30.0–36.0)
MCV: 80.1 fL (ref 78.0–100.0)
Platelets: 223 10*3/uL (ref 150–400)
RBC: 5.28 MIL/uL — AB (ref 3.87–5.11)
RDW: 15.4 % (ref 11.5–15.5)
WBC: 9 10*3/uL (ref 4.0–10.5)

## 2015-06-11 LAB — BASIC METABOLIC PANEL
ANION GAP: 11 (ref 5–15)
BUN: 30 mg/dL — ABNORMAL HIGH (ref 6–20)
CO2: 22 mmol/L (ref 22–32)
Calcium: 9 mg/dL (ref 8.9–10.3)
Chloride: 105 mmol/L (ref 101–111)
Creatinine, Ser: 1.56 mg/dL — ABNORMAL HIGH (ref 0.44–1.00)
GFR calc Af Amer: 33 mL/min — ABNORMAL LOW (ref >60)
GFR, EST NON AFRICAN AMERICAN: 28 mL/min — AB (ref >60)
Glucose, Bld: 165 mg/dL — ABNORMAL HIGH (ref 65–99)
POTASSIUM: 4.8 mmol/L (ref 3.5–5.1)
SODIUM: 138 mmol/L (ref 135–145)

## 2015-06-11 LAB — PROTIME-INR
INR: 2.53 — AB (ref 0.00–1.49)
PROTHROMBIN TIME: 26.9 s — AB (ref 11.6–15.2)

## 2015-06-11 LAB — TROPONIN I
TROPONIN I: 0.08 ng/mL — AB (ref <0.031)
Troponin I: 0.09 ng/mL — ABNORMAL HIGH (ref <0.031)

## 2015-06-11 LAB — GLUCOSE, CAPILLARY
GLUCOSE-CAPILLARY: 169 mg/dL — AB (ref 65–99)
Glucose-Capillary: 164 mg/dL — ABNORMAL HIGH (ref 65–99)

## 2015-06-11 MED ORDER — ACETAMINOPHEN 325 MG PO TABS
650.0000 mg | ORAL_TABLET | ORAL | Status: DC | PRN
  Administered 2015-06-11: 650 mg via ORAL
  Filled 2015-06-11: qty 2

## 2015-06-11 MED ORDER — TIMOLOL MALEATE 0.25 % OP SOLN
1.0000 [drp] | Freq: Two times a day (BID) | OPHTHALMIC | Status: DC
  Administered 2015-06-11 – 2015-06-12 (×2): 1 [drp] via OPHTHALMIC
  Filled 2015-06-11: qty 5

## 2015-06-11 MED ORDER — INSULIN ASPART 100 UNIT/ML ~~LOC~~ SOLN
0.0000 [IU] | Freq: Three times a day (TID) | SUBCUTANEOUS | Status: DC
  Administered 2015-06-11 – 2015-06-12 (×2): 3 [IU] via SUBCUTANEOUS
  Administered 2015-06-12: 8 [IU] via SUBCUTANEOUS
  Administered 2015-06-13 – 2015-06-14 (×4): 3 [IU] via SUBCUTANEOUS
  Administered 2015-06-14: 5 [IU] via SUBCUTANEOUS

## 2015-06-11 MED ORDER — FUROSEMIDE 10 MG/ML IJ SOLN
40.0000 mg | INTRAMUSCULAR | Status: AC
  Administered 2015-06-11: 40 mg via INTRAVENOUS
  Filled 2015-06-11: qty 4

## 2015-06-11 MED ORDER — ONDANSETRON HCL 4 MG/2ML IJ SOLN: 4.0000 mg | Freq: Four times a day (QID) | INTRAMUSCULAR | Status: DC | PRN

## 2015-06-11 MED ORDER — ASPIRIN 81 MG PO CHEW
324.0000 mg | CHEWABLE_TABLET | Freq: Once | ORAL | Status: AC
  Administered 2015-06-11: 324 mg via ORAL
  Filled 2015-06-11: qty 4

## 2015-06-11 MED ORDER — INSULIN GLARGINE 100 UNIT/ML ~~LOC~~ SOLN
15.0000 [IU] | Freq: Two times a day (BID) | SUBCUTANEOUS | Status: DC
  Administered 2015-06-11 – 2015-06-14 (×6): 15 [IU] via SUBCUTANEOUS
  Filled 2015-06-11 (×7): qty 0.15

## 2015-06-11 MED ORDER — ENOXAPARIN SODIUM 40 MG/0.4ML ~~LOC~~ SOLN: 40.0000 mg | SUBCUTANEOUS | Status: DC

## 2015-06-11 MED ORDER — WARFARIN SODIUM 5 MG PO TABS
5.0000 mg | ORAL_TABLET | Freq: Once | ORAL | Status: AC
  Administered 2015-06-11: 5 mg via ORAL
  Filled 2015-06-11: qty 1

## 2015-06-11 MED ORDER — WARFARIN - PHARMACIST DOSING INPATIENT: Freq: Every day | Status: DC

## 2015-06-11 MED ORDER — DONEPEZIL HCL 5 MG PO TABS
5.0000 mg | ORAL_TABLET | ORAL | Status: DC
  Administered 2015-06-12 – 2015-06-14 (×3): 5 mg via ORAL
  Filled 2015-06-11 (×3): qty 1

## 2015-06-11 MED ORDER — LEVOTHYROXINE SODIUM 50 MCG PO TABS
50.0000 ug | ORAL_TABLET | Freq: Every day | ORAL | Status: DC
  Administered 2015-06-12 – 2015-06-14 (×3): 50 ug via ORAL
  Filled 2015-06-11 (×3): qty 1

## 2015-06-11 MED ORDER — INSULIN GLARGINE 100 UNIT/ML ~~LOC~~ SOLN
35.0000 [IU] | Freq: Two times a day (BID) | SUBCUTANEOUS | Status: DC
Start: 2015-06-11 — End: 2015-06-11

## 2015-06-11 MED ORDER — FUROSEMIDE 10 MG/ML IJ SOLN
40.0000 mg | Freq: Every day | INTRAMUSCULAR | Status: DC
  Administered 2015-06-12: 40 mg via INTRAVENOUS
  Filled 2015-06-11: qty 4

## 2015-06-11 MED ORDER — INSULIN ASPART 100 UNIT/ML ~~LOC~~ SOLN: 0.0000 [IU] | Freq: Every day | SUBCUTANEOUS | Status: DC

## 2015-06-11 MED ORDER — INSULIN ASPART 100 UNIT/ML ~~LOC~~ SOLN: 0.0000 [IU] | Freq: Three times a day (TID) | SUBCUTANEOUS | Status: DC

## 2015-06-11 MED ORDER — ASPIRIN 325 MG PO TABS
325.0000 mg | ORAL_TABLET | ORAL | Status: DC
  Administered 2015-06-12: 325 mg via ORAL
  Filled 2015-06-11: qty 1

## 2015-06-11 MED ORDER — FUROSEMIDE 40 MG PO TABS: 40.0000 mg | ORAL_TABLET | ORAL | Status: DC

## 2015-06-11 NOTE — ED Provider Notes (Signed)
CSN: [id number]     Arrival date & time 06/11/15  1353 History   First MD Initiated Contact with Patient 06/11/15 1412     Chief Complaint  Patient presents with  . Code STEMI     (Consider location/radiation/quality/duration/timing/severity/associated sxs/prior Treatment) HPI Comments: 81 year old female with past medical history including CHF, atrial fibrillation, type 2 diabetes mellitus who presents with weakness. History obtained with the assistance of EMS. They report that they were called to the scene because the patient had been walking to her car with assistance from family members and began having pain in the back of her neck between her shoulder blades. She became short of breath with the ambulation and when she got to her car she started to get anxious and began crying and feeling short of breath. When EMS arrived, they found her alert and oriented and in no acute distress. EKG was obtained during transport which showed a bundle branch block and EMS initiated a STEMI alert. Currently, the patient denies any chest pain or shortness of breath. No abdominal pain. No fevers, vomiting, or recent illness.  The history is provided by the patient and a relative.    Past Medical History  Diagnosis Date  . Diabetes mellitus   . Shortness of breath     with activity  . CHF (congestive heart failure) may 2012  . Pulmonary embolus 03-23-11  . Arthritis     left leg  . UTI (lower urinary tract infection)   . Thyroid disease   . Atrial fibrillation    Past Surgical History  Procedure Laterality Date  . Kisney stone removal  1978    removed thru bladder  . Cystoscopy w/ ureteral stent placement  09/06/2011    Procedure: CYSTOSCOPY WITH RETROGRADE PYELOGRAM/URETERAL STENT PLACEMENT;  Surgeon: Valetta Fuller, MD;  Location: [facility];  Service: Urology;  Laterality: Right;  cysto double j stent placement  . Ureteroscopy  09/06/2011    Procedure: URETEROSCOPY;  Surgeon: Valetta Fuller, MD;   Location: [facility];  Service: Urology;  Laterality: Right;   No family history on file. Social History  Substance Use Topics  . Smoking status: Never Smoker   . Smokeless tobacco: Never Used  . Alcohol Use: No   OB History    No data available     Review of Systems 10 Systems reviewed and are negative for acute change except as noted in the HPI.    Allergies  Iohexol; Ivp dye; and Bactrim  Home Medications   Prior to Admission medications   Medication Sig Start Date End Date Taking? Authorizing Provider  aspirin 325 MG tablet Take 325 mg by mouth every morning.      Historical Provider, MD  beta carotene w/minerals (OCUVITE) tablet Take 1 tablet by mouth daily.    Historical Provider, MD  cefpodoxime (VANTIN) 100 MG tablet Take 1 tablet (100 mg total) by mouth 2 (two) times daily. 11/11/14   Purvis Sheffield, MD  ciprofloxacin (CIPRO) 500 MG tablet Take 1 tablet (500 mg total) by mouth every 12 (twelve) hours. Patient not taking: Reported on 11/11/2014 08/14/13   Gwyneth Sprout, MD  donepezil (ARICEPT) 5 MG tablet Take 5 mg by mouth every morning.     Historical Provider, MD  furosemide (LASIX) 40 MG tablet Take 40 mg by mouth every morning.     Historical Provider, MD  glipiZIDE (GLUCOTROL) 10 MG tablet Take 10 mg by mouth 2 (two) times daily before a meal.  Historical Provider, MD  insulin glargine (LANTUS) 100 UNIT/ML injection Inject 35 Units into the skin 2 (two) times daily.     Historical Provider, MD  levothyroxine (SYNTHROID, LEVOTHROID) 50 MCG tablet Take 50 mcg by mouth daily before breakfast.    Historical Provider, MD  meloxicam (MOBIC) 15 MG tablet Take 15 mg by mouth daily.    Historical Provider, MD  metFORMIN (GLUCOPHAGE-XR) 500 MG 24 hr tablet Take 500 mg by mouth at bedtime.     Historical Provider, MD  neomycin-bacitracin-polymyxin (NEOSPORIN) ophthalmic ointment Place 1 application into the right eye at bedtime.    Historical Provider, MD  PREDNISOLONE  ACETATE OP Place 1 drop into the right eye 4 (four) times daily.    Historical Provider, MD  TIMOLOL MALEATE OP Place 1 drop into the right eye 2 (two) times daily.    Historical Provider, MD  trimethoprim (TRIMPEX) 100 MG tablet Take 100 mg by mouth daily.    Historical Provider, MD  warfarin (COUMADIN) 5 MG tablet Take 5 mg by mouth daily.    Historical Provider, MD   BP 166/46 mmHg  Pulse 41  Temp(Src) 98.8 F (37.1 C) (Oral)  Resp 20  SpO2 100% Physical Exam  Constitutional: She is oriented to person, place, and time. She appears well-developed and well-nourished.  Chronically ill appearing and mildly dyspneic but no acute distress  HENT:  Head: Normocephalic and atraumatic.  Moist mucous membranes  Eyes: Conjunctivae are normal. Pupils are equal, round, and reactive to light.  Neck: Neck supple.  Cardiovascular: Normal rate, regular rhythm and normal heart sounds.   No murmur heard. Pulmonary/Chest: No stridor.  Mildly increased work of breathing with diminished breath sounds bilaterally but no respiratory distress  Abdominal: Soft. Bowel sounds are normal. She exhibits no distension. There is no tenderness.  Musculoskeletal:  Mild bilateral lower extremity edema  Neurological: She is alert and oriented to person, place, and time.  Fluent speech  Skin: Skin is warm and dry.  Psychiatric: She has a normal mood and affect. Judgment normal.  Nursing note and vitals reviewed.   ED Course  Procedures (including critical care time) Labs Review Labs Reviewed  BASIC METABOLIC PANEL - Abnormal; Notable for the following:    Glucose, Bld 165 (*)    BUN 30 (*)    Creatinine, Ser 1.56 (*)    GFR calc non Af Amer 28 (*)    GFR calc Af Amer 33 (*)    All other components within normal limits  CBC - Abnormal; Notable for the following:    RBC 5.28 (*)    All other components within normal limits  URINALYSIS, ROUTINE W REFLEX MICROSCOPIC (NOT AT [facility])  Rosezena Sensor, ED     Imaging Review Dg Chest Portable 1 View  06/11/2015   CLINICAL DATA:  Myocardial infarction.  Intra for ablation.  EXAM: PORTABLE CHEST - 1 VIEW  COMPARISON:  August 14, 2013  FINDINGS: The heart size and mediastinal contours are stable. The aorta is tortuous. Heart size is enlarged. There is pulmonary edema. There is no focal pneumonia. There is probable minimal right pleural effusion. The visualized skeletal structures are stable.  IMPRESSION: Pulmonary edema.  Probable minimal right pleural effusion.   Electronically Signed   By: Sherian Rein M.D.   On: 06/11/2015 15:27   I have personally reviewed and evaluated these lab results as part of my medical decision-making.   EKG Interpretation   Date/Time:  Wednesday June 11 2015 14:01:55  EDT Ventricular Rate:  51 PR Interval:    QRS Duration: 158 QT Interval:  529 QTC Calculation: 487 R Axis:   -68 Text Interpretation:  Atrial flutter with predominant 4:1 AV block Right  bundle branch block Left ventricular hypertrophy Anterior infarct, old A  flutter new since previous EKG, no ischemic changes Confirmed by Ledarius Leeson  MD, Trinidi Toppins ([id number]) on 06/11/2015 2:13:02 PM      MDM   Final diagnoses:  Bradycardia  Pulmonary edema with congestive heart failure   81 year old female who presents with episode of pain between her shoulder blades associated with shortness of breath that began after exertion when trying to walk to the car. Patient was picked up by EMS and they called a STEMI alert in transport. On arrival, we obtained an EKG which showed right bundle-branch block and atrial flutter but no ischemic changes when compared to previous EKGs. A STEMI alert was canceled. Placed patient on cardiac monitoring and obtained above lab work as well as chest x-ray.  Labs showed stable CK D ad 1.56, negative troponin. Chest x-ray showed pulmonary edema with right pleural effusion. I suspect that a CHF exacerbation is playing a role in the  patient's SOB. While in the emergency department, the patient began having episodes of bradycardia into the 30s and 40s. Pacer pads were placed. During examination, the patient denied complaints even when heart rate was in the 30s. Because of the patient's unexplained episodes of bleeding or cardiac in the setting of CHF exacerbation, she was admitted for further care. Gave the patient 40 mg IV Lasix.  Laurence Spates, MD 06/11/15 [phone]

## 2015-06-11 NOTE — Progress Notes (Signed)
ANTICOAGULATION CONSULT NOTE - Initial Consult  Pharmacy Consult for Coumadin Indication: atrial fibrillation  Allergies  Allergen Reactions  . Iohexol   . Ivp Dye [Iodinated Diagnostic Agents]     itching  . Bactrim Itching, Nausea And Vomiting and Rash    And diarrhea    Patient Measurements: Height: 5\' 5"  (165.1 cm) Weight: 257 lb (116.574 kg) (per daughter) IBW/kg (Calculated) : 57  Labs:  Recent Labs  06/11/15 1405 06/11/15 1737 06/11/15 1745 06/11/15 2009  HGB 14.1  --   --   --   HCT 42.3  --   --   --   PLT 223  --   --   --   LABPROT  --   --  26.9*  --   INR  --   --  2.53*  --   CREATININE 1.56*  --   --   --   TROPONINI  --  0.08*  --  0.09*    Estimated Creatinine Clearance: 30.6 mL/min (by C-G formula based on Cr of 1.56).   Medical History: Past Medical History  Diagnosis Date  . Diabetes mellitus   . Shortness of breath     with activity  . CHF (congestive heart failure) may 2012  . Pulmonary embolus 03-23-11  . Arthritis     left leg  . UTI (lower urinary tract infection)   . Thyroid disease   . Atrial fibrillation    Assessment:   81 yr old female admitted with shoulder pain and shortness of breath, concern for angina. To continue home Coumadin for atrial fibrillation and hx PE (2012).   INR is therapeutic (2.53).  Takes Coumadin 5 mg alternating with 7.5 mg.  Daughter and grandson report that she took 7.5 mg on 06/10/15.  Takes in the evening at home, so has not had today's dose yet.  Also takes Aspirin 325 mg daily and Meloxican 15 mg qhs for arthritis.  Holding Meloxicam for now.  Goal of Therapy:  INR 2-3 Monitor platelets by anticoagulation protocol: Yes   Plan:   Coumadin 5 mg tonight. Usual dose.  Daily PT/INR for now.  Lovenox order cancelled, as INR is therapeutic.  Dennie Fetters, [place] Pager: [phone] 06/11/2015,9:16 PM

## 2015-06-11 NOTE — ED Notes (Signed)
Pt having periods of bradycardia, placed on zoll pads. EDP notified. Remains on cardiac monitor. Denies chest pain. Respirations unlabored. Pt is alert and oriented x4.

## 2015-06-11 NOTE — Progress Notes (Signed)
   06/11/15 1400  Clinical Encounter Type  Visited With Health care provider  Visit Type Initial;Code  Referral From Care management   Chaplain responded to a Code STEMI in the ED, and the STEMI was cancelled. No family present at this time. Chaplain support available as needed. Pager - [phone]  Alda Ponder, Chaplain 06/11/2015 2:01 PM

## 2015-06-11 NOTE — ED Notes (Signed)
Pt presents to department via [facility], pt was walking to car when she became weak, began having pain to upper neck to both shoulder blades, also states SOB. Pt is alert and oriented x4. History of CHF and atrial fibrillation. 20g R forearm.

## 2015-06-11 NOTE — H&P (Signed)
[facility] Admission History and Physical Service Pager: [phone]  Patient name: Sara Dennis Medical record number: [id number] Date of birth: November 16, 1921 Age: 81 y.o. Gender: female  Primary Care Provider: Theodoro Kos, MD Consultants: None Code Status: Full  Chief Complaint:  Shoulder pain, SOB  Assessment and Plan: Sara Dennis is a 82 y.o. female presenting with shoulder pain, weakness and shortness of breath  . PMH is significant for DM2. H/o afib, CHF, dementia, CKDIII  Shoulder discomfort- Shoulder discomfort along with constellation of symptoms in addition to risk factors is concerning for angina equivalent. Troponon x1 negative in the ED, EKG show A-flutter but with out RVR. Symptoms have resolved with rest and after being given aspirin in the ED - Admit to telemetry, family medicine service Dr Gwendolyn Grant - Aspirin 325 - trend troponins - Repeat EKG in AM  CHF- Last echo 01/2013 EF 55-60%, moderate LVH mild LA dilatation. Pt denies SOB at this time, however CXR shows pulmonary edema and cardiomegally concerning for worsening cardiac function. No evidence of LE edema - Supplemental O2 to maintain sats >92% - Repeat echo - s/p lasix 40 IV in the ED - continue lasix 40 IV daily for gentle diuresis   Atrial flutter- Stable with HR 40s-50s - coumadin per pharmacy - tele - follow closely  H/o PE in 2012  - Continue coumadin as above  DM2- last A1c 8.6 on5/02/2013, per report last A1c was 6.9. Takes lantus 35 BID, metformin 500 qHS, glipizide 10 mg BID.  - Repeat A1c - continue 1/2 home lantus, 15 BID - SSI - CBG ACHS  CKDIII- CR 1.56 on admission, baseline appears approx 1.4.  - continue to monitor closely especially in the setting of diuresis for CHR concern  Dementia- Stable - Continue home Aricept  Weakness- Now resolved - PT/OT  Social- Per family they have bedside commode, wheel chair, walker and shower chair at home -Consider  Case management consult for further home health needs  FEN/GI: Heart healthy diet, SLIV Prophylaxis: Lovenox  Disposition: Admit to telemetry, family medicine  History of Present Illness:  Sara Dennis is a 81 y.o. female presenting with shoulder "tiredness", weakness and shortness of breath with ambulation from house to [place]. She denies chest pain during this episode. By arrival to the ED her shoulder discomfort, weakness  resolved She states that she was in her usual state of health until this AM when she felt "bad" and tired such that she did not want to get out of bed. She denies chest pain, shortness of breath, fevers or chills but does report a chronic cough which has been stable; no sputum produiction.  Her granddaughter helped her to get dressed as usual, but today noted that she seemed to get more winded than usual with this activity. She had a hair appointment today and when leaving the house was noted to get increasingly short of breath, grew weak such that she wanted to sit on the ground, and reported shoulder "tiredness". She denies shoulder pain and says her shoulders feel as if she has been exercising then very hard and they are fatigued. She felt nauseated at that time but did not throw up. She also felt short of breath and daughter noted she was sweaty and clammy.  She has a history of this type of shoulder discomfort with exertion for the last 2 years, with similar SOB when this occurs. However, per the granddaughter and patient this is much worse. She can  usually get to the car without this degree of shoulder discomfort, shortness of breath or weakness.  She otherwise denies dizziness, headache, abdominal pain, dysuria, diarrhea, fevers, chills or sick contacts. Has been able to tolerate normal PO and reports a good appetite  Review Of Systems: Per HPI.  Patient Active Problem List   Diagnosis Date Noted  . Bradycardia 06/11/2015  . Atrial flutter 05/06/2013  . Diabetes  mellitus 05/05/2013  . Hypotension 05/04/2013  . UTI (urinary tract infection) 01/30/2013  . CAP (community acquired pneumonia) 01/30/2013  . Leukocytosis, unspecified 01/30/2013  . Acute encephalopathy 01/30/2013  . CHF (congestive heart failure) 01/30/2013  . Chronic kidney disease stage III 01/30/2013  . Ureteral calculus 09/06/2011   Past Medical History: Past Medical History  Diagnosis Date  . Diabetes mellitus   . Shortness of breath     with activity  . CHF (congestive heart failure) may 2012  . Pulmonary embolus 03-23-11  . Arthritis     left leg  . UTI (lower urinary tract infection)   . Thyroid disease   . Atrial fibrillation    Past Surgical History: Past Surgical History  Procedure Laterality Date  . Kisney stone removal  1978    removed thru bladder  . Cystoscopy w/ ureteral stent placement  09/06/2011    Procedure: CYSTOSCOPY WITH RETROGRADE PYELOGRAM/URETERAL STENT PLACEMENT;  Surgeon: Valetta Fuller, MD;  Location: [facility];  Service: Urology;  Laterality: Right;  cysto double j stent placement  . Ureteroscopy  09/06/2011    Procedure: URETEROSCOPY;  Surgeon: Valetta Fuller, MD;  Location: [facility];  Service: Urology;  Laterality: Right;   Social History: Social History  Substance Use Topics  . Smoking status: Never Smoker   . Smokeless tobacco: Never Used  . Alcohol Use: No   Additional social history: Lives with grand daughter and great grandchildren, denies ever smoking, drinking ETOH Please also refer to relevant sections of EMR.  Family History: No family history on file. Allergies and Medications: Allergies  Allergen Reactions  . Iohexol   . Ivp Dye [Iodinated Diagnostic Agents]     itching  . Bactrim Itching and Rash   No current facility-administered medications on file prior to encounter.   Current Outpatient Prescriptions on File Prior to Encounter  Medication Sig Dispense Refill  . aspirin 325 MG tablet Take 325 mg by mouth every  morning.      . beta carotene w/minerals (OCUVITE) tablet Take 1 tablet by mouth daily.    . cefpodoxime (VANTIN) 100 MG tablet Take 1 tablet (100 mg total) by mouth 2 (two) times daily. 14 tablet 0  . ciprofloxacin (CIPRO) 500 MG tablet Take 1 tablet (500 mg total) by mouth every 12 (twelve) hours. (Patient not taking: Reported on 11/11/2014) 20 tablet 0  . donepezil (ARICEPT) 5 MG tablet Take 5 mg by mouth every morning.     . furosemide (LASIX) 40 MG tablet Take 40 mg by mouth every morning.     Marland Kitchen glipiZIDE (GLUCOTROL) 10 MG tablet Take 10 mg by mouth 2 (two) times daily before a meal.    . insulin glargine (LANTUS) 100 UNIT/ML injection Inject 35 Units into the skin 2 (two) times daily.     Marland Kitchen levothyroxine (SYNTHROID, LEVOTHROID) 50 MCG tablet Take 50 mcg by mouth daily before breakfast.    . meloxicam (MOBIC) 15 MG tablet Take 15 mg by mouth daily.    . metFORMIN (GLUCOPHAGE-XR) 500 MG 24 hr tablet Take 500  mg by mouth at bedtime.     Marland Kitchen neomycin-bacitracin-polymyxin (NEOSPORIN) ophthalmic ointment Place 1 application into the right eye at bedtime.    Marland Kitchen PREDNISOLONE ACETATE OP Place 1 drop into the right eye 4 (four) times daily.    Marland Kitchen TIMOLOL MALEATE OP Place 1 drop into the right eye 2 (two) times daily.    Marland Kitchen trimethoprim (TRIMPEX) 100 MG tablet Take 100 mg by mouth daily.    Marland Kitchen warfarin (COUMADIN) 5 MG tablet Take 5 mg by mouth daily.      Objective: BP 131/51 mmHg  Pulse 48  Temp(Src) 98.8 F (37.1 C) (Oral)  Resp 20  SpO2 100% Exam: General: NAD, lying in hospital bed HEENT: PERRL, right eye tracks, blind in left eye Cardiovascular: Irregular rhythm, reg rate, no murmurs auscultated Respiratory: Crackles heard most at bases, distant lung sounds, limited exam secondary to body habitus Abdomen: obese, soft, non tender Extremities: no LE edema, mild tenderness at left knee but no warmth, rash or deformity MSK: No CVA tenderness, no shoulder or back pain on palpation Skin: no  rashes noted Neuro: Alert and oriented x3, no focal deficits, 5/5 strength in upper and lower extremities Psych: Normal mood and affect  Labs and Imaging: CBC BMET   Recent Labs Lab 06/11/15 1405  WBC 9.0  HGB 14.1  HCT 42.3  PLT 223    Recent Labs Lab 06/11/15 1405  NA 138  K 4.8  CL 105  CO2 22  BUN 30*  CREATININE 1.56*  GLUCOSE 165*  CALCIUM 9.0     CXR  IMPRESSION: Pulmonary edema. Probable minimal right pleural effusion.  Bonney Aid, MD 06/11/2015, 3:41 PM PGY-2 [facility] Intern pager: [phone], text pages welcome

## 2015-06-12 ENCOUNTER — Encounter (HOSPITAL_COMMUNITY): Payer: Self-pay | Admitting: Nurse Practitioner

## 2015-06-12 ENCOUNTER — Inpatient Hospital Stay (HOSPITAL_COMMUNITY): Payer: Medicare Other

## 2015-06-12 DIAGNOSIS — I248 Other forms of acute ischemic heart disease: Secondary | ICD-10-CM | POA: Diagnosis present

## 2015-06-12 DIAGNOSIS — I209 Angina pectoris, unspecified: Secondary | ICD-10-CM

## 2015-06-12 DIAGNOSIS — I4892 Unspecified atrial flutter: Secondary | ICD-10-CM | POA: Diagnosis present

## 2015-06-12 DIAGNOSIS — I501 Left ventricular failure: Secondary | ICD-10-CM | POA: Diagnosis present

## 2015-06-12 DIAGNOSIS — I208 Other forms of angina pectoris: Secondary | ICD-10-CM

## 2015-06-12 DIAGNOSIS — R0600 Dyspnea, unspecified: Secondary | ICD-10-CM

## 2015-06-12 LAB — PROTIME-INR
INR: 2.79 — ABNORMAL HIGH (ref 0.00–1.49)
Prothrombin Time: 29 seconds — ABNORMAL HIGH (ref 11.6–15.2)

## 2015-06-12 LAB — BASIC METABOLIC PANEL
ANION GAP: 9 (ref 5–15)
BUN: 35 mg/dL — ABNORMAL HIGH (ref 6–20)
CHLORIDE: 106 mmol/L (ref 101–111)
CO2: 23 mmol/L (ref 22–32)
Calcium: 8.9 mg/dL (ref 8.9–10.3)
Creatinine, Ser: 1.64 mg/dL — ABNORMAL HIGH (ref 0.44–1.00)
GFR calc Af Amer: 31 mL/min — ABNORMAL LOW (ref >60)
GFR, EST NON AFRICAN AMERICAN: 26 mL/min — AB (ref >60)
GLUCOSE: 197 mg/dL — AB (ref 65–99)
POTASSIUM: 4.7 mmol/L (ref 3.5–5.1)
Sodium: 138 mmol/L (ref 135–145)

## 2015-06-12 LAB — CBC
HEMATOCRIT: 40.8 % (ref 36.0–46.0)
HEMOGLOBIN: 13.5 g/dL (ref 12.0–15.0)
MCH: 26.4 pg (ref 26.0–34.0)
MCHC: 33.1 g/dL (ref 30.0–36.0)
MCV: 79.7 fL (ref 78.0–100.0)
Platelets: 261 10*3/uL (ref 150–400)
RBC: 5.12 MIL/uL — ABNORMAL HIGH (ref 3.87–5.11)
RDW: 15.3 % (ref 11.5–15.5)
WBC: 8.1 10*3/uL (ref 4.0–10.5)

## 2015-06-12 LAB — GLUCOSE, CAPILLARY
GLUCOSE-CAPILLARY: 91 mg/dL (ref 65–99)
GLUCOSE-CAPILLARY: 260 mg/dL — AB (ref 65–99)
Glucose-Capillary: 188 mg/dL — ABNORMAL HIGH (ref 65–99)
Glucose-Capillary: 130 mg/dL — ABNORMAL HIGH (ref 65–99)

## 2015-06-12 LAB — HEMOGLOBIN A1C
HEMOGLOBIN A1C: 7 % — AB (ref 4.8–5.6)
Mean Plasma Glucose: 154 mg/dL

## 2015-06-12 LAB — TROPONIN I
TROPONIN I: 0.1 ng/mL — AB (ref <0.031)
Troponin I: 0.08 ng/mL — ABNORMAL HIGH (ref <0.031)

## 2015-06-12 MED ORDER — WARFARIN SODIUM 5 MG PO TABS
5.0000 mg | ORAL_TABLET | Freq: Once | ORAL | Status: AC
  Administered 2015-06-12: 5 mg via ORAL
  Filled 2015-06-12: qty 1

## 2015-06-12 MED ORDER — ASPIRIN 81 MG PO CHEW: 81.0000 mg | CHEWABLE_TABLET | Freq: Every day | ORAL | Status: DC

## 2015-06-12 NOTE — Evaluation (Signed)
Physical Therapy Evaluation Patient Details Name: AKYRAH CHRISTEN MRN: [id number] DOB: 1922-09-01 Today's Date: 06/12/2015   History of Present Illness  PATRA SHORTRIDGE is a 81 y.o. female presenting with shoulder pain, weakness and shortness of breath . PMH is significant for DM2. H/o afib, CHF, dementia, CKDIII  Clinical Impression  Pt admitted with the above complications. Pt currently with functional limitations due to the deficits listed below (see PT Problem List). Requires frequent min assist for transfers and gait which was limited to 30 feet today due to fatigue and weakness. Pt and son reports this is patient's baseline with mobility however Mrs. Masarik reports she feels much weaker than usual. Spoke with daughter over phone who states she does not wish for pt to go to SNF or have any HHPT at discharge, as the family has been taking care of her on their own, despite my recommendations for HHPT. She is at high risk of falling. Pt will benefit from skilled PT to increase their independence and safety with mobility to allow discharge to the venue listed below.       Follow Up Recommendations Home health PT;Supervision for mobility/OOB (Daughter refuses HHPT)    Equipment Recommendations  None recommended by PT    Recommendations for Other Services OT consult     Precautions / Restrictions Precautions Precautions: Fall Precaution Comments: Nearly full blindness. Monitor HR Restrictions Weight Bearing Restrictions: No      Mobility  Bed Mobility Overal bed mobility: Modified Independent             General bed mobility comments: Requires extra time and effort but able to complete on her own  Transfers Overall transfer level: Needs assistance Equipment used: Rolling walker (2 wheeled) Transfers: Sit to/from Stand Sit to Stand: Min assist         General transfer comment: Min assist for boost to stand from lowest bed setting. First attempt pt with knee pain and sat very  shortly after standing. Second attempt to struggled to stand but was able to maintain once upright.  Ambulation/Gait Ambulation/Gait assistance: Min assist Ambulation Distance (Feet): 30 Feet Assistive device: Rolling walker (2 wheeled) Gait Pattern/deviations: Step-through pattern;Decreased stride length;Shuffle;Trunk flexed Gait velocity: slow Gait velocity interpretation: <1.8 ft/sec, indicative of risk for recurrent falls General Gait Details: Shuffled gait, requiring frequent min assist for balance and control with rolling walker. Max VC for upright posture and for larger step length. Very effortful for pt to complete short distance. Son states this is her baseline. HR elevated to 94.  Stairs            Wheelchair Mobility    Modified Rankin (Stroke Patients Only)       Balance Overall balance assessment: Needs assistance Sitting-balance support: No upper extremity supported;Feet supported Sitting balance-Leahy Scale: Fair     Standing balance support: Bilateral upper extremity supported Standing balance-Leahy Scale: Poor                               Pertinent Vitals/Pain Pain Assessment: No/denies pain    Home Living Family/patient expects to be discharged to:: Private residence Living Arrangements: Children;Other relatives Available Help at Discharge: Family;Available 24 hours/day Type of Home: Mobile home Home Access: Stairs to enter Entrance Stairs-Rails: Right Entrance Stairs-Number of Steps: 4 Home Layout: One level Home Equipment: Cane - single point;Wheelchair - Economist - 2 wheels      Prior Function Level of  Independence: Needs assistance   Gait / Transfers Assistance Needed: walks with cane to bathroom. Uses w/c when out of house  ADL's / Homemaking Assistance Needed: Needs assist with bath and dress        Hand Dominance   Dominant Hand: Right    Extremity/Trunk Assessment   Upper Extremity Assessment:  Defer to OT evaluation           Lower Extremity Assessment: Generalized weakness         Communication   Communication: No difficulties (Near full Blindness)  Cognition Arousal/Alertness: Awake/alert Behavior During Therapy: WFL for tasks assessed/performed Overall Cognitive Status: Within Functional Limits for tasks assessed                      General Comments General comments (skin integrity, edema, etc.): Resting HR 52, SpO2 100% on room air.. HR up to 94 ambulating. Son present and observing in room; states her mobility appears to be at baseline however pt states she is weaker than usual.    Exercises        Assessment/Plan    PT Assessment Patient needs continued PT services  PT Diagnosis Difficulty walking;Abnormality of gait;Generalized weakness   PT Problem List Decreased strength;Decreased activity tolerance;Decreased balance;Decreased mobility;Decreased knowledge of use of DME;Obesity  PT Treatment Interventions DME instruction;Gait training;Stair training;Functional mobility training;Therapeutic activities;Therapeutic exercise;Neuromuscular re-education;Balance training;Patient/family education   PT Goals (Current goals can be found in the Care Plan section) Acute Rehab PT Goals Patient Stated Goal: Go home PT Goal Formulation: With patient/family Time For Goal Achievement: 06/26/15 Potential to Achieve Goals: Good    Frequency Min 3X/week   Barriers to discharge        Co-evaluation               End of Session Equipment Utilized During Treatment: Gait belt Activity Tolerance: Patient limited by fatigue Patient left: in bed;with call bell/phone within reach;with bed alarm set;with family/visitor present Nurse Communication: Mobility status;Precautions         Time: 1610-9604 PT Time Calculation (min) (ACUTE ONLY): 17 min   Charges:   PT Evaluation $Initial PT Evaluation Tier I: 1 Procedure     PT G CodesBerton Mount 06/12/2015, 6:46 PM Charlsie Merles, [place] [phone]

## 2015-06-12 NOTE — Progress Notes (Signed)
  Echocardiogram 2D Echocardiogram has been performed.  Cathie Beams 06/12/2015, 4:43 PM

## 2015-06-12 NOTE — Consult Note (Signed)
ELECTROPHYSIOLOGY CONSULT NOTE    Patient ID: Sara Dennis MRN: [id number], DOB/AGE: [birth year] 81 y.o.  Admit date: 06/11/2015 Date of Consult: 06/12/2015  Primary Physician: Theodoro Kos, MD Primary Cardiologist: Rennis Golden (new this admission)  Reason for Consultation: bradycardia  HPI:  Sara Dennis is a 81 y.o. female with a past medical history significant for diabetes, hypothyroidism, glaucoma with significantly decreased vision and permanent atrial fibrillation/atrial flutter. On the day of admission, she was at home with her daughter when she developed pre-syncope, diaphoresis, and pallor.  Her daughter called EMS and on their arrival she was bradycardic and transported to [facility] for further evaluation. She has been monitored on telemetry with persistent bradycardia although it is difficult to tell how symptomatic she is. She is not very active at home and has been wheelchair bound for the past 2 years when going out of the house. While inside the house, she will walk to the bathroom or to the kitchen using a cane.  Her activity is limited because of her poor vision. She has not had dizziness or syncope prior to this episode.  EP has been asked to evaluate for treatment options.  Echo pending this admission. Troponin mildly elevated but trend flat. Creatinine 1.56 on admission which is slightly elevated from baseline.   Past Medical History  Diagnosis Date  . Diabetes mellitus   . CHF (congestive heart failure)   . Pulmonary embolus 03-23-11  . Arthritis     left leg  . UTI (lower urinary tract infection)   . Hypothyroidism   . Permanent atrial fibrillation      Surgical History:  Past Surgical History  Procedure Laterality Date  . Kisney stone removal  1978    removed thru bladder  . Cystoscopy w/ ureteral stent placement  09/06/2011    Procedure: CYSTOSCOPY WITH RETROGRADE PYELOGRAM/URETERAL STENT PLACEMENT;  Surgeon: Valetta Fuller, MD;  Location: [facility];  Service:  Urology;  Laterality: Right;  cysto double j stent placement  . Ureteroscopy  09/06/2011    Procedure: URETEROSCOPY;  Surgeon: Valetta Fuller, MD;  Location: [facility];  Service: Urology;  Laterality: Right;     Prescriptions prior to admission  Medication Sig Dispense Refill Last Dose  . aspirin 325 MG tablet Take 325 mg by mouth every morning.     06/11/2015 at Unknown time  . beta carotene w/minerals (OCUVITE) tablet Take 1 tablet by mouth daily.   06/11/2015 at Unknown time  . donepezil (ARICEPT) 5 MG tablet Take 5 mg by mouth every morning. Prefers to take in the morning   06/11/2015 at Unknown time  . furosemide (LASIX) 40 MG tablet Take 40 mg by mouth every morning.    06/11/2015 at Unknown time  . glipiZIDE (GLUCOTROL) 10 MG tablet Take 10 mg by mouth 2 (two) times daily before a meal.   06/11/2015 at Unknown time  . insulin glargine (LANTUS) 100 UNIT/ML injection Inject 35 Units into the skin 2 (two) times daily.    06/11/2015 at Unknown time  . levothyroxine (SYNTHROID, LEVOTHROID) 50 MCG tablet Take 50 mcg by mouth daily before breakfast.   06/11/2015 at Unknown time  . meloxicam (MOBIC) 15 MG tablet Take 15 mg by mouth at bedtime.    06/10/2015 at Unknown time  . metFORMIN (GLUCOPHAGE-XR) 500 MG 24 hr tablet Take 500 mg by mouth at bedtime.    06/10/2015 at Unknown time  . Timolol Maleate PF 0.25 % SOLN Place 1 drop into the  right eye 2 (two) times daily.   06/11/2015 at Unknown time  . trimethoprim (TRIMPEX) 100 MG tablet Take 100 mg by mouth daily.   06/11/2015 at Unknown time  . warfarin (COUMADIN) 5 MG tablet Take 5-7.5 mg by mouth daily at 6 PM. Alternates 5 mg with 7.5 mg   06/10/2015 at 1800    Inpatient Medications:  . [START ON 06/13/2015] aspirin  81 mg Oral Daily  . donepezil  5 mg Oral BH-q7a  . furosemide  40 mg Intravenous Daily  . insulin aspart  0-15 Units Subcutaneous TID WC  . insulin aspart  0-5 Units Subcutaneous QHS  . insulin glargine  15 Units Subcutaneous BID  .  levothyroxine  50 mcg Oral QAC breakfast  . timolol  1 drop Right Eye BID  . Warfarin - Pharmacist Dosing Inpatient   Does not apply q1800    Allergies:  Allergies  Allergen Reactions  . Iohexol   . Ivp Dye [Iodinated Diagnostic Agents]     itching  . Bactrim Itching, Nausea And Vomiting and Rash    And diarrhea    Social History   Social History  . Marital Status: Widowed    Spouse Name: N/A  . Number of Children: N/A  . Years of Education: N/A   Occupational History  . Not on file.   Social History Main Topics  . Smoking status: Never Smoker   . Smokeless tobacco: Never Used  . Alcohol Use: No  . Drug Use: No  . Sexual Activity: No   Other Topics Concern  . Not on file   Social History Narrative     Family History - no early CAD  Review of Systems: General: No chills, fever, night sweats or weight changes  Cardiovascular:  No chest pain, dyspnea on exertion, edema, orthopnea, palpitations, paroxysmal nocturnal dyspnea Dermatological: No rash, lesions or masses Respiratory: No cough, dyspnea Urologic: No hematuria, dysuria Abdominal: No nausea, vomiting, diarrhea, bright red blood per rectum, melena, or hematemesis Neurologic: No visual changes, weakness, changes in mental status All other systems reviewed and are otherwise negative except as noted above.  Physical Exam: Filed Vitals:   06/12/15 0032 06/12/15 0357 06/12/15 0400 06/12/15 1212  BP: 156/49 145/52  124/94  Pulse: 50 51  52  Temp:   97.8 F (36.6 C) 97.9 F (36.6 C)  TempSrc:   Oral Oral  Resp: 20 22  15   Height:      Weight:   240 lb 8 oz (109.09 kg)   SpO2: 94% 96%  96%    GEN- The patient is elderly and obese appearing, alert and oriented x 3 today.   HEENT: normocephalic, atraumatic; sclera clear, conjunctiva pink; hearing intact; oropharynx clear; neck supple  Lungs- Clear to ausculation bilaterally, normal work of breathing.  No wheezes, rales, rhonchi Heart- Bradycardic  irregular rate and rhythm  GI- obese, non-tender, non-distended, bowel sounds present  Extremities- no clubbing, cyanosis, or edema; DP/PT/radial pulses 1+ bilaterally MS- no significant deformity or atrophy Skin- warm and dry, no rash or lesion Psych- euthymic mood, full affect Neuro- strength and sensation are intact  Labs:   Lab Results  Component Value Date   WBC 8.1 06/12/2015   HGB 13.5 06/12/2015   HCT 40.8 06/12/2015   MCV 79.7 06/12/2015   PLT 261 06/12/2015     Recent Labs Lab 06/12/15 1020  NA 138  K 4.7  CL 106  CO2 23  BUN 35*  CREATININE 1.64*  CALCIUM 8.9  GLUCOSE 197*      Radiology/Studies: Dg Chest Portable 1 View 06/11/2015   CLINICAL DATA:  Myocardial infarction.  Intra for ablation.  EXAM: PORTABLE CHEST - 1 VIEW  COMPARISON:  August 14, 2013  FINDINGS: The heart size and mediastinal contours are stable. The aorta is tortuous. Heart size is enlarged. There is pulmonary edema. There is no focal pneumonia. There is probable minimal right pleural effusion. The visualized skeletal structures are stable.  IMPRESSION: Pulmonary edema.  Probable minimal right pleural effusion.   Electronically Signed   By: Sherian Rein M.D.   On: 06/11/2015 15:27    EKG: atypical atrial flutter, ventricular rate 50, RBBB, LAD  TELEMETRY: atrial flutter, ventricular rate 30-50's  Assessment/Plan; 1.  Bradycardia It is unclear how symptomatic she is.  She does have underlying conduction system disease with RBBB and LAD. He episode at home is not clearly arrhythmic but could have been related to bradycardia. She is clear in her decision to avoid invasive procedures at this time.  I think this is reasonable with advanced age and sedentary lifestyle.  For now, would hold Timolol eye drops if ok with ophthalmologist and monitor clinically.  2.  Permanent atrial flutter/atrial fibrillation Continue Warfarin for CHADS2VASC of at least 4 She has had no recent bleeding issues or  falls Would discontinue ASA with coumadin use  3.  Diastolic heart failure Euvolemic on exam Echo pending this admission  Electrophysiology team to see as needed while here. Please call with questions.  Signed, Gypsy Balsam, NP 06/12/2015 1:55 PM   I have seen, examined the patient, and reviewed the above assessment and plan. On exam bradycardic irregular rhythm. Changes to above are made where necessary.  She is clear in her decision to avoid pacing.  No further EP workup planned.  OK to discharge from my standpoint.    Hillis Range MD, [facility] 06/12/2015 5:45 PM

## 2015-06-12 NOTE — Progress Notes (Signed)
ANTICOAGULATION CONSULT NOTE  Pharmacy Consult for Coumadin Indication: atrial fibrillation  Allergies  Allergen Reactions  . Iohexol   . Ivp Dye [Iodinated Diagnostic Agents]     itching  . Bactrim Itching, Nausea And Vomiting and Rash    And diarrhea    Patient Measurements: Height: 5\' 5"  (165.1 cm) Weight: 240 lb 8 oz (109.09 kg) IBW/kg (Calculated) : 57  Labs:  Recent Labs  06/11/15 1405  06/11/15 1745 06/11/15 2009 06/11/15 2339 06/12/15 0324  HGB 14.1  --   --   --   --   --   HCT 42.3  --   --   --   --   --   PLT 223  --   --   --   --   --   LABPROT  --   --  26.9*  --   --  29.0*  INR  --   --  2.53*  --   --  2.79*  CREATININE 1.56*  --   --   --   --   --   TROPONINI  --   < >  --  0.09* 0.10* 0.08*  < > = values in this interval not displayed.  Estimated Creatinine Clearance: 29.4 mL/min (by C-G formula based on Cr of 1.56).   Medical History: Past Medical History  Diagnosis Date  . Diabetes mellitus   . Shortness of breath     with activity  . CHF (congestive heart failure) may 2012  . Pulmonary embolus 03-23-11  . Arthritis     left leg  . UTI (lower urinary tract infection)   . Thyroid disease   . Atrial fibrillation    Assessment:   81 yr old female admitted with shoulder pain and shortness of breath, concern for angina. To continue home Coumadin for atrial fibrillation and hx PE (2012).   INR is therapeutic (2.53).  Takes Coumadin 5 mg alternating with 7.5 mg.  Daughter and grandson report that she took 7.5 mg on 06/10/15.  Takes in the evening at home, so has not had today's dose yet.  Also takes Aspirin 325 mg daily and Meloxican 15 mg qhs for arthritis.  Holding Meloxicam for now.  INR has trended up to 2.79 today -> will give 5 mg instead of 7.5 mg tonight (off home schedule)  Goal of Therapy:  INR 2-3 Monitor platelets by anticoagulation protocol: Yes   Plan:  Coumadin 5 mg po x 1 dose tonight at 1800 pm Daily INR  Thank  you Okey Regal, PharmD [phone] 06/12/2015,10:37 AM

## 2015-06-12 NOTE — Consult Note (Signed)
CONSULTATION NOTE  Reason for Consult: Atrial flutter, Chest pain, elevated troponin  Requesting Physician: Dr. Mingo Amber  Cardiologist: None (NEW)  HPI: This is a 81 y.o. female with a past medical history significant for atrial flutter (newly diagnosed in 2014) on warfarin - she also has a history of recurrent PE, possibly in 1978 and in 2012, type 2 diabetes on insulin, hypothyroidism, diastolic heart failure, CKD 3 and mild dementia. She presented to the hospital yesterday with one day of increased malaise and pain in her back and her shoulder which occurred at rest. In the emergency department she was found to be in atrial flutter with a slow ventricular rate. Heart rate continues to be slow, and overnight heart rate was in the 30s. Troponins were checked and were mildly elevated at 0.02, then 0.08, 0.09, 0.1 and 0.08.  INR has been therapeutic at 2.53 and 2.79. Creatinine is elevated at 1.56. Chest x-ray shows pulmonary edema and probable minimal right pleural effusion. EKG shows atypical atrial flutter with a variable AV block and right bundle branch block. Echo in 2014 showed EF of 55-60% with probable mild aortic stenosis and regurgitation, mild mitral regurgitation and mild left atrial enlargement. A repeat echocardiogram is ordered. Cardiology is asked to consult regarding chest pain, elevated troponins, mild heart failure and atrial flutter with bradycardia - heart rate in the 30s.  PMHx:  Past Medical History  Diagnosis Date  . Diabetes mellitus   . Shortness of breath     with activity  . CHF (congestive heart failure) may 2012  . Pulmonary embolus 03-23-11  . Arthritis     left leg  . UTI (lower urinary tract infection)   . Thyroid disease   . Atrial fibrillation    Past Surgical History  Procedure Laterality Date  . Kisney stone removal  1978    removed thru bladder  . Cystoscopy w/ ureteral stent placement  09/06/2011    Procedure: CYSTOSCOPY WITH RETROGRADE  PYELOGRAM/URETERAL STENT PLACEMENT;  Surgeon: Bernestine Amass, MD;  Location: [facility];  Service: Urology;  Laterality: Right;  cysto double j stent placement  . Ureteroscopy  09/06/2011    Procedure: URETEROSCOPY;  Surgeon: Bernestine Amass, MD;  Location: [facility];  Service: Urology;  Laterality: Right;    FAMHx: Noncontributory given her age.  SOCHx:  reports that she has never smoked. She has never used smokeless tobacco. She reports that she does not drink alcohol or use illicit drugs.  ALLERGIES: Allergies  Allergen Reactions  . Iohexol   . Ivp Dye [Iodinated Diagnostic Agents]     itching  . Bactrim Itching, Nausea And Vomiting and Rash    And diarrhea    ROS: Review of systems not obtained due to patient factors.  HOME MEDICATIONS: Prescriptions prior to admission  Medication Sig Dispense Refill Last Dose  . aspirin 325 MG tablet Take 325 mg by mouth every morning.     06/11/2015 at Unknown time  . beta carotene w/minerals (OCUVITE) tablet Take 1 tablet by mouth daily.   06/11/2015 at Unknown time  . donepezil (ARICEPT) 5 MG tablet Take 5 mg by mouth every morning. Prefers to take in the morning   06/11/2015 at Unknown time  . furosemide (LASIX) 40 MG tablet Take 40 mg by mouth every morning.    06/11/2015 at Unknown time  . glipiZIDE (GLUCOTROL) 10 MG tablet Take 10 mg by mouth 2 (two) times daily before a meal.   06/11/2015 at Unknown  time  . insulin glargine (LANTUS) 100 UNIT/ML injection Inject 35 Units into the skin 2 (two) times daily.    06/11/2015 at Unknown time  . levothyroxine (SYNTHROID, LEVOTHROID) 50 MCG tablet Take 50 mcg by mouth daily before breakfast.   06/11/2015 at Unknown time  . meloxicam (MOBIC) 15 MG tablet Take 15 mg by mouth at bedtime.    06/10/2015 at Unknown time  . metFORMIN (GLUCOPHAGE-XR) 500 MG 24 hr tablet Take 500 mg by mouth at bedtime.    06/10/2015 at Unknown time  . Timolol Maleate PF 0.25 % SOLN Place 1 drop into the right eye 2 (two) times daily.    06/11/2015 at Unknown time  . trimethoprim (TRIMPEX) 100 MG tablet Take 100 mg by mouth daily.   06/11/2015 at Unknown time  . warfarin (COUMADIN) 5 MG tablet Take 5-7.5 mg by mouth daily at 6 PM. Alternates 5 mg with 7.5 mg   06/10/2015 at [place]: I have reviewed the patient's current medications.  VITALS: Blood pressure 145/52, pulse 51, temperature 97.8 F (36.6 C), temperature source Oral, resp. rate 22, height _0  (1.651 m), weight 240 lb 8 oz (109.09 kg), SpO2 96 %.  PHYSICAL EXAM: General appearance: alert, no distress and moderately obese Neck: no carotid bruit and no JVD Lungs: diminished breath sounds bilaterally and rales bibasilar Heart: irregularly irregular rhythm and Bradycardia Abdomen: soft, non-tender; bowel sounds normal; no masses,  no organomegaly Extremities: extremities normal, atraumatic, no cyanosis or edema Pulses: 2+ and symmetric Skin: Skin color, texture, turgor normal. No rashes or lesions Neurologic: Grossly normal Psych: Mild dementia, pleasant  LABS: Results for orders placed or performed during the hospital encounter of 06/11/15 (from the past 48 hour(s))  Basic metabolic panel     Status: Abnormal   Collection Time: 06/11/15  2:05 PM  Result Value Ref Range   Sodium 138 135 - 145 mmol/L   Potassium 4.8 3.5 - 5.1 mmol/L   Chloride 105 101 - 111 mmol/L   CO2 22 22 - 32 mmol/L   Glucose, Bld 165 (H) 65 - 99 mg/dL   BUN 30 (H) 6 - 20 mg/dL   Creatinine, Ser 1.56 (H) 0.44 - 1.00 mg/dL   Calcium 9.0 8.9 - 10.3 mg/dL   GFR calc non Af Amer 28 (L) >60 mL/min   GFR calc Af Amer 33 (L) >60 mL/min    Comment: (NOTE) The eGFR has been calculated using the CKD EPI equation. This calculation has not been validated in all clinical situations. eGFR's persistently <60 mL/min signify possible Chronic Kidney Disease.    Anion gap 11 5 - 15  CBC     Status: Abnormal   Collection Time: 06/11/15  2:05 PM  Result Value Ref Range   WBC  9.0 4.0 - 10.5 K/uL   RBC 5.28 (H) 3.87 - 5.11 MIL/uL   Hemoglobin 14.1 12.0 - 15.0 g/dL   HCT 42.3 36.0 - 46.0 %   MCV 80.1 78.0 - 100.0 fL   MCH 26.7 26.0 - 34.0 pg   MCHC 33.3 30.0 - 36.0 g/dL   RDW 15.4 11.5 - 15.5 %   Platelets 223 150 - 400 K/uL  Hemoglobin A1c     Status: Abnormal   Collection Time: 06/11/15  2:05 PM  Result Value Ref Range   Hgb A1c MFr Bld 7.0 (H) 4.8 - 5.6 %    Comment: (NOTE)         Pre-diabetes: 5.7 -  6.4         Diabetes: >6.4         Glycemic control for adults with diabetes: <7.0    Mean Plasma Glucose 154 mg/dL    Comment: (NOTE) Performed At: [facility] [place], [place] [id number] Lindon Romp MD VA:[id number]   I-stat troponin, ED     Status: None   Collection Time: 06/11/15  2:22 PM  Result Value Ref Range   Troponin i, poc 0.02 0.00 - 0.08 ng/mL   Comment 3            Comment: Due to the release kinetics of cTnI, a negative result within the first hours of the onset of symptoms does not rule out myocardial infarction with certainty. If myocardial infarction is still suspected, repeat the test at appropriate intervals.   Glucose, capillary     Status: Abnormal   Collection Time: 06/11/15  4:56 PM  Result Value Ref Range   Glucose-Capillary 164 (H) 65 - 99 mg/dL   Comment 1 Notify RN    Comment 2 Document in Chart   Troponin I-serum (0, 3, 6 hours)     Status: Abnormal   Collection Time: 06/11/15  5:37 PM  Result Value Ref Range   Troponin I 0.08 (H) <0.031 ng/mL    Comment:        PERSISTENTLY INCREASED TROPONIN VALUES IN THE RANGE OF 0.04-0.49 ng/mL CAN BE SEEN IN:       -UNSTABLE ANGINA       -CONGESTIVE HEART FAILURE       -MYOCARDITIS       -CHEST TRAUMA       -ARRYHTHMIAS       -LATE PRESENTING MYOCARDIAL INFARCTION       -COPD   CLINICAL FOLLOW-UP RECOMMENDED.   Protime-INR     Status: Abnormal   Collection Time: 06/11/15  5:45 PM  Result Value Ref Range   Prothrombin Time 26.9 (H)  11.6 - 15.2 seconds   INR 2.53 (H) 0.00 - 1.49  Troponin I-serum (0, 3, 6 hours)     Status: Abnormal   Collection Time: 06/11/15  8:09 PM  Result Value Ref Range   Troponin I 0.09 (H) <0.031 ng/mL    Comment:        PERSISTENTLY INCREASED TROPONIN VALUES IN THE RANGE OF 0.04-0.49 ng/mL CAN BE SEEN IN:       -UNSTABLE ANGINA       -CONGESTIVE HEART FAILURE       -MYOCARDITIS       -CHEST TRAUMA       -ARRYHTHMIAS       -LATE PRESENTING MYOCARDIAL INFARCTION       -COPD   CLINICAL FOLLOW-UP RECOMMENDED.   Glucose, capillary     Status: Abnormal   Collection Time: 06/11/15  9:13 PM  Result Value Ref Range   Glucose-Capillary 169 (H) 65 - 99 mg/dL  Troponin I-serum (0, 3, 6 hours)     Status: Abnormal   Collection Time: 06/11/15 11:39 PM  Result Value Ref Range   Troponin I 0.10 (H) <0.031 ng/mL    Comment:        PERSISTENTLY INCREASED TROPONIN VALUES IN THE RANGE OF 0.04-0.49 ng/mL CAN BE SEEN IN:       -UNSTABLE ANGINA       -CONGESTIVE HEART FAILURE       -MYOCARDITIS       -CHEST TRAUMA       -  ARRYHTHMIAS       -LATE PRESENTING MYOCARDIAL INFARCTION       -COPD   CLINICAL FOLLOW-UP RECOMMENDED.   Protime-INR     Status: Abnormal   Collection Time: 06/12/15  3:24 AM  Result Value Ref Range   Prothrombin Time 29.0 (H) 11.6 - 15.2 seconds   INR 2.79 (H) 0.00 - 1.49  Troponin I (q 6hr x 3)     Status: Abnormal   Collection Time: 06/12/15  3:24 AM  Result Value Ref Range   Troponin I 0.08 (H) <0.031 ng/mL    Comment:        PERSISTENTLY INCREASED TROPONIN VALUES IN THE RANGE OF 0.04-0.49 ng/mL CAN BE SEEN IN:       -UNSTABLE ANGINA       -CONGESTIVE HEART FAILURE       -MYOCARDITIS       -CHEST TRAUMA       -ARRYHTHMIAS       -LATE PRESENTING MYOCARDIAL INFARCTION       -COPD   CLINICAL FOLLOW-UP RECOMMENDED.   Glucose, capillary     Status: None   Collection Time: 06/12/15  7:28 AM  Result Value Ref Range   Glucose-Capillary 91 65 - 99 mg/dL     IMAGING: Dg Chest Portable 1 View  06/11/2015   CLINICAL DATA:  Myocardial infarction.  Intra for ablation.  EXAM: PORTABLE CHEST - 1 VIEW  COMPARISON:  August 14, 2013  FINDINGS: The heart size and mediastinal contours are stable. The aorta is tortuous. Heart size is enlarged. There is pulmonary edema. There is no focal pneumonia. There is probable minimal right pleural effusion. The visualized skeletal structures are stable.  IMPRESSION: Pulmonary edema.  Probable minimal right pleural effusion.   Electronically Signed   By: Abelardo Diesel M.D.   On: 06/11/2015 15:27    HOSPITAL DIAGNOSES: Active Problems:   Bradycardia   Anginal pain   History of pulmonary embolism   Pulmonary edema with congestive heart failure   Atrial flutter, unspecified   IMPRESSION: 1. Atypical atrial flutter with slow ventricular response, not on negative chronotropes 2. Chest pain with mildly elevated troponins 3. Probable diastolic congestive heart failure - likely demand ischemia 4. CKD 3 5. Insulin-dependent diabetes 6. 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 Mrs. Monterroso presents with what is a cute decompensated congestive heart failure, suspicious for diastolic etiology. A repeat echocardiogram has been ordered. She also had pain in her chest that radiated to her left shoulder which is resolved. Troponins have been mildly elevated but flat, likely consistent with demand ischemia. Given her age and risk factors, it is likely she has underlying coronary artery disease, but she is not a good candidate for invasive or noninvasive workup based on stage III chronic kidney disease and age.  I am concerned about her slow ventricular response with regards to atrial flutter. There is underlying intraventricular conduction delay with a right bundle branch block with heart rates in the 30s. I'm unable to determine whether she is significantly fatigued with this and her family says that she  is not particularly active at home. She was not on any negative chronotropic agents, except for timolol eyedrops which may have minimal effects. Agree with an echocardiogram today to further assess LV function and atrial size. I've asked cardiac electrophysiology to additionally consult with recommendations about possible pacemaker given her bradycardia. I would recommend decreasing her aspirin to 81 mg in the setting of concomitant warfarin  use.  Thank you for the consultation, cardiology will follow with you.  Time Spent Directly with Patient: 45 minutes  Pixie Casino, MD, [facility] Attending Cardiologist [place] 06/12/2015, 9:35 AM

## 2015-06-12 NOTE — Discharge Summary (Signed)
Family Medicine Teaching [facility] Discharge Summary  Patient name: Sara Dennis Medical record number: [id number] Date of birth: [birth year] Age: 81 y.o. Gender: female Date of Admission: 06/11/2015  Date of Discharge: 06/14/15 Admitting Physician: Tobey Grim, MD  Primary Care Provider: Theodoro Kos, MD Consultants: Cardiology, Electrophysiology   Indication for Hospitalization: increasing malaise and back/shoulder pain concerning for anginal equivalent found to be bradycardic ventricular response with atrial flutter  Discharge Diagnoses/Problem List:  Atrial Flutter Diastolic CHF exacerbation Demand ischemia Bradycardia  Disposition: home  Discharge Condition: improved  Brief Hospital Course:  Sara Dennis is a 81 y.o. female who presented with shoulder pain, weakness and shortness of breath concerning for angina. PMH is significant for DM2. H/o afib, CHF, dementia, CKDIII  Shoulder Discomfort concerning for Anginal Equivalent:  On admission she was noted to be bradycardic to the 40s, but otherwise EKG showed no evidence of ischemia but atrial flutter which she has chronically. Initial troponin was negative. Her symptoms resolved by presentation to the ED after being given aspirin 324. Troponins were trended overnight and went to 0.1, however trended down after this; this was thought to be demand ischemia. Repeat EKG also did not show signs of ischemia. Cardiology was consulted and given her bradycardia there was concern for heart conduction delay causing her bradycardia and symptoms. EP saw her to discuss pacemaker which she declined. Additionally per cardiology, given her age and risk factors, patient was not a good candidate for invasive or noninvasive ischemic workup based on stage III chronic kidney disease and age.  Diastolic CHF:  She did have a Chest xray on admission which showed evidence of pulmonary edema and she was continued on lasix 40 IV qD. Mild CHF  exacerbation was likely due to bradycardia. She had an echo which showed LVEF 60-65%, severe LVH, G2DD, moderate/severe pulmonary HTN, moderate TR, and moderate biatrial enlargement. Patient was eventually transitioned to PO Lasix 40mg  daily. Weight on day of discharge was 232lb (257lb on admission).   Atrial Flutter:  Patient was continued on Coumadin. Patient's HR was in 40s-50s. As noted above, Cardiology was consulted and given her bradycardia there was concern for heart conduction delay causing her bradycardia and symptoms. EP saw her to discuss pacemaker which she declined. As patient may be changing PCPs in the near future, Dr. Delton See (cardiology) discussed possibly managing patient's coumadin in the future. Patient was continued on her home coumadin regimen. Additionally, ASA was discontinued per cardiology due to concomitant warfarin use, and timolol eye drops were also discontinued to avoid negative chronotropic agents.  DM2:  Patient's A1c 9.0 on admission. She takes Lantus 35 BID, Metformin 500 qHS, and glipizide 10 mg BID at home. During admission, patient was on Lantus 15 units BID with SSI. Patient was discharged on Lantus 15 BID to avoid hypoglycemia at home.   Urinary Tract Infection:  Patient with urinary urgency and incontence concerning for UTI. U/A with large leukocyte esterase, +nitrite, TNTC WBC and few bacteria. Patient was started on Keflex on 9/16 and was discharged with medication to complete a 7 day course (through AM dose on 9/23)  Issues for Follow Up:  - Patient discharged on Lantus 15 units BID, consider titrating back up to home dose - Follow up urinary symptoms  - Follow up INR on 9/20 (daugther to draw).  - Patient's ASA was discontinued as she was already on warfarin per cardiology. She was started on Lipitor.   Significant Procedures: none  Significant Labs and  Imaging:   Recent Labs Lab 06/11/15 1405 06/12/15 1020  WBC 9.0 8.1  HGB 14.1 13.5  HCT 42.3  40.8  PLT 223 261    Recent Labs Lab 06/11/15 1405 06/12/15 1020 06/13/15 0658 06/14/15 0830  NA 138 138 139 137  K 4.8 4.7 4.5 4.4  CL 105 106 105 102  CO2 GLUCOSE 165* 197* 152* 236*  BUN 30* 35* 32* 32*  CREATININE 1.56* 1.64* 1.42* 1.51*  CALCIUM 9.0 8.9 9.0 9.4   ECHO: LVEF 60-65%, severe LVH, G2DD, moderate/severe pulmonary HTN, moderate TR, and moderate biatrial enlargement.  Results/Tests Pending at Time of Discharge: none  Discharge Medications:    Medication List    STOP taking these medications        aspirin 325 MG tablet     Timolol Maleate PF 0.25 % Soln     trimethoprim 100 MG tablet  Commonly known as:  TRIMPEX      TAKE these medications        atorvastatin 20 MG tablet  Commonly known as:  LIPITOR  Take 1 tablet (20 mg total) by mouth daily at 6 PM.     beta carotene w/minerals tablet  Take 1 tablet by mouth daily.     cephALEXin 500 MG capsule  Commonly known as:  KEFLEX  Take 1 capsule (500 mg total) by mouth every 12 (twelve) hours.     donepezil 5 MG tablet  Commonly known as:  ARICEPT  Take 5 mg by mouth every morning. Prefers to take in the morning     furosemide 40 MG tablet  Commonly known as:  LASIX  Take 40 mg by mouth every morning.     glipiZIDE 10 MG tablet  Commonly known as:  GLUCOTROL  Take 10 mg by mouth 2 (two) times daily before a meal.     insulin glargine 100 UNIT/ML injection  Commonly known as:  LANTUS  Inject 0.15 mLs (15 Units total) into the skin 2 (two) times daily.     levothyroxine 50 MCG tablet  Commonly known as:  SYNTHROID, LEVOTHROID  Take 50 mcg by mouth daily before breakfast.     meloxicam 15 MG tablet  Commonly known as:  MOBIC  Take 15 mg by mouth at bedtime.     metFORMIN 500 MG 24 hr tablet  Commonly known as:  GLUCOPHAGE-XR  Take 500 mg by mouth at bedtime.     warfarin 5 MG tablet  Commonly known as:  COUMADIN  Take 5-7.5 mg by mouth daily at 6 PM. Alternates 5 mg  with 7.5 mg        Discharge Instructions: Please refer to Patient Instructions section of EMR for full details.  Patient was counseled important signs and symptoms that should prompt return to medical care, changes in medications, dietary instructions, activity restrictions, and follow up appointments.   Follow-Up Appointments: Follow-up Information    Follow up with Dr. Caryl Asp On 06/17/2015.   Why:  at 11:15am    Contact information:   Dr. Ladean Raya  [facility] [address] [place], [place] [address] Phone: [phone] Fax: [phone]       Follow up with Wilburt Finlay, PA-C On 06/27/2015.   Specialties:  Physician Assistant, Radiology, Interventional Cardiology   Why:  @ 10am   Contact information:   [address] [place] [address] [phone]  Palma Holter, MD 06/16/2015, 7:22 PM PGY-1, [facility]

## 2015-06-12 NOTE — Progress Notes (Signed)
Family Medicine Teaching Service Daily Progress Note Intern Pager: [phone]  Patient name: Sara Dennis Medical record number: [id number] Date of birth: 04-26-1922 Age: 81 y.o. Gender: female  Primary Care Provider: Theodoro Kos, MD Consultants: Cardiology Code Status: Full  Pt Overview and Major Events to Date:  9/16: Admitted for Anginal type pain  Assessment and Plan:  Sara Dennis is a 81 y.o. female presenting with shoulder pain, weakness and shortness of breath. PMH is significant for DM2. H/o afib, CHF, dementia, CKDIII  Shoulder discomfort-  Resolved not but concerning to represent angina. Troponins elevated overnight but decreasing, EKG stable Aflutter - Admit to telemetry - Aspirin 325 - trend troponins - Cardiology consult  CHF- Improved SOB, O2 sats to 96 on RA, Last echo 01/2013 EF 55-60%, moderate LVH mild LA dilatation. - Supplemental O2 to maintain sats >92% PRN - Repeat echo - Lasix 40 IV qD  Atrial flutter- Stable with HR 40s-50s - coumadin per pharmacy - tele - follow closely  H/o PE in 2012  - Continue coumadin as above  DM2- last A1c 8.6 on5/02/2013, per report last A1c was 6.9. Takes lantus 35 BID, metformin 500 qHS, glipizide 10 mg BID.  - Repeat A1c - continue 1/2 home lantus, 15 BID - SSI - CBG ACHS  CKDIII-   Cr pending this AM, Cr 1.56 on admission, baseline appears approx 1.4.  - continue to monitor closely especially in the setting of diuresis for CHR concern  Dementia- Stable - Continue home Aricept  Weakness- Now resolved - PT/OT  Social- Per family they have bedside commode, wheel chair, walker and shower chair at home -Consider Case management consult for further home health needs  FEN/GI: Heart healthy diet, SLIV Prophylaxis: Lovenox  Disposition: Admit to telemetry, family medicine Subjective:  Denies shoulder discomfort, chest pain , SOB. Able to get to bedside commode without SOB, shoulder discomfort or chest pain  but has yet to ambulate farther than that  Objective: Temp:  [97.5 F (36.4 C)-98.8 F (37.1 C)] 97.8 F (36.6 C) (09/15 0400) Pulse Rate:  [41-58] 51 (09/15 0357) Resp:  [15-22] 22 (09/15 0357) BP: (109-166)/(46-63) 145/52 mmHg (09/15 0357) SpO2:  [94 %-100 %] 96 % (09/15 0357) Weight:  [240 lb 8 oz (109.09 kg)-257 lb (116.574 kg)] 240 lb 8 oz (109.09 kg) (09/15 0400) Physical Exam: General: NAD, lying in bed Cardiovascular: RRR, no murmurs auscultated Respiratory: CTAB, no wheezes or crackles auscultated Abdomen: obese, soft, non tender Extremities: no LE edema, non tender  Laboratory:  Recent Labs Lab 06/11/15 1405  WBC 9.0  HGB 14.1  HCT 42.3  PLT 223    Recent Labs Lab 06/11/15 1405  NA 138  K 4.8  CL 105  CO2 22  BUN 30*  CREATININE 1.56*  CALCIUM 9.0  GLUCOSE 165*      Imaging/Diagnostic Tests: CXR 9/16  IMPRESSION: Pulmonary edema. Probable minimal right pleural effusion.  Bonney Aid, MD 06/12/2015, 9:23 AM PGY-2, [place] [facility] FPTS Intern pager: [phone], text pages welcome

## 2015-06-12 NOTE — Progress Notes (Signed)
MD on call notified of pt's heart rate intermittently dropping as low as 33 especially when sleeping. Pt asymptomatic. VS have been stable.Will continue to monitor the pt. Sanda Linger, RN

## 2015-06-12 NOTE — Clinical Documentation Improvement (Signed)
Cardiology Family Medicine  Can the diagnosis of diastolic CHF be further specified?    Acuity - Acute, Chronic, Acute on Chronic  Other  Clinically Undetermined   Document any associated diagnoses/conditions  Supporting Information:  IV Lasix 40 mg daily  Please exercise your independent, professional judgment when responding. A specific answer is not anticipated or expected.  Thank You,  Shellee Milo BSN, RN Health Information Management  [phone]

## 2015-06-13 ENCOUNTER — Telehealth: Payer: Self-pay | Admitting: Internal Medicine

## 2015-06-13 LAB — GLUCOSE, CAPILLARY
GLUCOSE-CAPILLARY: 187 mg/dL — AB (ref 65–99)
Glucose-Capillary: 151 mg/dL — ABNORMAL HIGH (ref 65–99)
Glucose-Capillary: 175 mg/dL — ABNORMAL HIGH (ref 65–99)
Glucose-Capillary: 161 mg/dL — ABNORMAL HIGH (ref 65–99)

## 2015-06-13 LAB — BASIC METABOLIC PANEL
Anion gap: 9 (ref 5–15)
BUN: 32 mg/dL — ABNORMAL HIGH (ref 6–20)
CHLORIDE: 105 mmol/L (ref 101–111)
CO2: 25 mmol/L (ref 22–32)
CREATININE: 1.42 mg/dL — AB (ref 0.44–1.00)
Calcium: 9 mg/dL (ref 8.9–10.3)
GFR calc Af Amer: 36 mL/min — ABNORMAL LOW (ref >60)
GFR calc non Af Amer: 31 mL/min — ABNORMAL LOW (ref >60)
GLUCOSE: 152 mg/dL — AB (ref 65–99)
Potassium: 4.5 mmol/L (ref 3.5–5.1)
Sodium: 139 mmol/L (ref 135–145)

## 2015-06-13 LAB — URINE MICROSCOPIC-ADD ON

## 2015-06-13 LAB — URINALYSIS, ROUTINE W REFLEX MICROSCOPIC
Bilirubin Urine: NEGATIVE
GLUCOSE, UA: NEGATIVE mg/dL
Ketones, ur: NEGATIVE mg/dL
Nitrite: POSITIVE — AB
PH: 6 (ref 5.0–8.0)
PROTEIN: 30 mg/dL — AB
Specific Gravity, Urine: 1.013 (ref 1.005–1.030)
Urobilinogen, UA: 0.2 mg/dL (ref 0.0–1.0)

## 2015-06-13 LAB — PROTIME-INR
INR: 2.5 — ABNORMAL HIGH (ref 0.00–1.49)
Prothrombin Time: 26.7 seconds — ABNORMAL HIGH (ref 11.6–15.2)

## 2015-06-13 MED ORDER — FUROSEMIDE 40 MG PO TABS
40.0000 mg | ORAL_TABLET | Freq: Every day | ORAL | Status: DC
  Administered 2015-06-13 – 2015-06-14 (×2): 40 mg via ORAL
  Filled 2015-06-13 (×2): qty 1

## 2015-06-13 MED ORDER — FUROSEMIDE 10 MG/ML IJ SOLN
40.0000 mg | Freq: Once | INTRAMUSCULAR | Status: AC
  Administered 2015-06-13: 40 mg via INTRAVENOUS
  Filled 2015-06-13: qty 4

## 2015-06-13 MED ORDER — ASPIRIN 81 MG PO CHEW
81.0000 mg | CHEWABLE_TABLET | Freq: Once | ORAL | Status: AC
  Administered 2015-06-13: 81 mg via ORAL
  Filled 2015-06-13: qty 1

## 2015-06-13 MED ORDER — CEPHALEXIN 500 MG PO CAPS
500.0000 mg | ORAL_CAPSULE | Freq: Two times a day (BID) | ORAL | Status: DC
  Administered 2015-06-13 – 2015-06-14 (×3): 500 mg via ORAL
  Filled 2015-06-13 (×3): qty 1

## 2015-06-13 MED ORDER — WARFARIN SODIUM 7.5 MG PO TABS
7.5000 mg | ORAL_TABLET | Freq: Once | ORAL | Status: AC
  Administered 2015-06-13: 7.5 mg via ORAL
  Filled 2015-06-13: qty 1

## 2015-06-13 MED ORDER — ATORVASTATIN CALCIUM 20 MG PO TABS
20.0000 mg | ORAL_TABLET | Freq: Every day | ORAL | Status: DC
  Administered 2015-06-13: 20 mg via ORAL
  Filled 2015-06-13: qty 1

## 2015-06-13 NOTE — Evaluation (Signed)
Occupational Therapy Evaluation Patient Details Name: Sara Dennis MRN: [id number] DOB: 12-Sep-1922 Today's Date: 06/13/2015    History of Present Illness Sara Dennis is a 81 y.o. female presenting with shoulder pain, weakness and shortness of breath . PMH is significant for DM2. H/o afib, CHF, dementia, CKDIII   Clinical Impression   Pt demonstrates decline in function and safety with ADLs and ADL mobility with decreased strength, balance and endurance. Pt would benefit from acute OT services to address impairments to increase level of function and safety to return home    Follow Up Recommendations  No OT follow up;Supervision/Assistance - 24 hour    Equipment Recommendations    none   Recommendations for Other Services       Precautions / Restrictions Precautions Precautions: Fall Precaution Comments: Nearly full blindness. Monitor HR Restrictions Weight Bearing Restrictions: No      Mobility Bed Mobility Overal bed mobility: Modified Independent             General bed mobility comments: Requires extra time and effort but able to complete on her own  Transfers Overall transfer level: Needs assistance Equipment used: Rolling walker (2 wheeled) Transfers: Sit to/from Stand Sit to Stand: Min assist         General transfer comment: Min assist for momemntum on 1st attempt, min guard A on 2nd attempt. Ambulated towards sink with RW    Balance   Sitting-balance support: No upper extremity supported;Feet supported Sitting balance-Leahy Scale: Good     Standing balance support: During functional activity Standing balance-Leahy Scale: Poor                              ADL Overall ADL's : Needs assistance/impaired     Grooming: Wash/dry hands;Wash/dry face;Standing;Min guard   Upper Body Bathing: Supervision/ safety;Set up;Sitting   Lower Body Bathing: Moderate assistance   Upper Body Dressing : Supervision/safety;Set up;Sitting    Lower Body Dressing: Moderate assistance   Toilet Transfer: Min guard;Minimal assistance;BSC   Toileting- Clothing Manipulation and Hygiene: Minimal assistance       Functional mobility during ADLs: Minimal assistance;Min guard;Cueing for safety;Rolling walker       Vision  no change from baseline   Perception Perception Perception Tested?: No   Praxis Praxis Praxis tested?: Not tested    Pertinent Vitals/Pain Pain Assessment: No/denies pain     Hand Dominance Right   Extremity/Trunk Assessment Upper Extremity Assessment Upper Extremity Assessment: Overall WFL for tasks assessed;Generalized weakness   Lower Extremity Assessment Lower Extremity Assessment: Defer to PT evaluation   Cervical / Trunk Assessment Cervical / Trunk Assessment: Normal   Communication Communication Communication: No difficulties   Cognition Arousal/Alertness: Awake/alert Behavior During Therapy: WFL for tasks assessed/performed Overall Cognitive Status: Within Functional Limits for tasks assessed                     General Comments   pt very pleasant and cooperative, family supportive                 Home Living Family/patient expects to be discharged to:: Private residence Living Arrangements: Children;Other relatives Available Help at Discharge: Family;Available 24 hours/day Type of Home: Mobile home Home Access: Stairs to enter Entrance Stairs-Number of Steps: 4 Entrance Stairs-Rails: Right Home Layout: One level     Bathroom Shower/Tub: Tub/shower unit;Walk-in Human resources officer: Standard     Home Equipment: [employer] -  single point;Wheelchair - Economist - 2 wheels          Prior Functioning/Environment Level of Independence: Needs assistance  Gait / Transfers Assistance Needed: walks with cane to bathroom. Uses w/c when out of house ADL's / Homemaking Assistance Needed: Needs assist with bath and dress LB        OT Diagnosis:  Generalized weakness   OT Problem List: Decreased activity tolerance;Impaired balance (sitting and/or standing);Decreased knowledge of use of DME or AE;Decreased strength   OT Treatment/Interventions: Self-care/ADL training;Therapeutic exercise;Patient/family education;DME and/or AE instruction;Therapeutic activities    OT Goals(Current goals can be found in the care plan section) Acute Rehab OT Goals Patient Stated Goal: Go home OT Goal Formulation: With patient/family Time For Goal Achievement: 06/20/15 Potential to Achieve Goals: Good ADL Goals Pt Will Perform Grooming: with set-up;with supervision;standing Pt Will Perform Upper Body Bathing: with set-up;sitting;standing Pt Will Perform Lower Body Bathing: with min assist Pt Will Perform Upper Body Dressing: with set-up;sitting;standing Pt Will Perform Lower Body Dressing: with min assist Pt Will Transfer to Toilet: with min guard assist;with supervision;ambulating;regular height toilet;grab bars Pt Will Perform Toileting - Clothing Manipulation and hygiene: with min guard assist;sit to/from stand  OT Frequency: Min 2X/week   Barriers to D/C:    none                     End of Session Equipment Utilized During Treatment: Rolling walker;Other (comment) (BSC)  Activity Tolerance: Patient tolerated treatment well Patient left: in bed;with call bell/phone within reach;with family/visitor present   Time: 5885-0277 OT Time Calculation (min): 29 min Charges:  OT General Charges $OT Visit: 1 Procedure OT Evaluation $Initial OT Evaluation Tier I: 1 Procedure OT Treatments $Therapeutic Activity: 8-22 mins G-Codes:    Galen Manila 06/13/2015, 1:24 PM

## 2015-06-13 NOTE — Care Management Important Message (Signed)
Important Message  Patient Details  Name: Sara Dennis MRN: [id number] Date of Birth: 06-17-1922   Medicare Important Message Given:  Yes-second notification given    Kyla Balzarine 06/13/2015, 11:34 AM

## 2015-06-13 NOTE — Progress Notes (Signed)
CSW (Clinical Child psychotherapist) received call from pt daughter with concerns about pt getting safely into their home. CSW explained non-emergent ambulance transport to pt daughter. CSW notified pt daughter that this is not a serviced that is guaranteed to be covered by insurance. Pt daughter understanding and would like transport arranged. CSW confirmed address and notified pt daughter transport will be arranged when pt is ready for dc.  Poonum Ambelal, LCSW [phone]

## 2015-06-13 NOTE — Progress Notes (Signed)
Physical Therapy Treatment Patient Details Name: Sara Dennis MRN: [id number] DOB: 01/21/1922 Today's Date: 06/13/2015    History of Present Illness Sara Dennis is a 81 y.o. female presenting with shoulder pain, weakness and shortness of breath . PMH is significant for DM2. H/o afib, CHF, dementia, CKDIII    PT Comments    Pt is progressing with her mobility.  She worked with OT this AM and we now went to practice the stairs with PT.  She was successful at getting up the stairs, but was unable to stand to come down them.  We sat down and scooted down the stairs and stood up from the bottom steps.  She is very weak and deconditioned.  Daughter mentioned that she is looking at getting a ramp.  She reports she encourages her mom all the time to get up and walk and move and her mom refuses, continuing a sedentary lifestyle.  I educated pt on the importance of moving to be able to continue to move, and to help with her arthritis pain.  PT will continue to follow acutely until pt d/c home.   Follow Up Recommendations  Home health PT;Supervision for mobility/OOB     Equipment Recommendations  None recommended by PT    Recommendations for Other Services   NA     Precautions / Restrictions Precautions Precautions: Fall Precaution Comments: Nearly full blindness. Monitor HR Restrictions Weight Bearing Restrictions: No    Mobility  Bed Mobility Overal bed mobility: Needs Assistance Bed Mobility: Supine to Sit     Supine to sit: Min assist     General bed mobility comments: min hand held assist to get to sitting EOB.    Transfers Overall transfer level: Needs assistance Equipment used: 1 person hand held assist Transfers: Sit to/from [employer] Sit to Stand: Min assist Stand pivot transfers: Min assist       General transfer comment: Min assist to support trunk during transitions, with fatigue up to mod assist from lower chair.  verbal cues for hand  placement for safety.                   Stairs Stairs: Yes Stairs assistance: Mod assist Stair Management: One rail Right;Step to pattern;Forwards Number of Stairs: 4 General stair comments: Pt was able to step up 4 steps with both hands on railing.  She became too fatigued to come back down stairs in standing, so we sat down and scooted down 3 steps to get to where her feet touched the ground and used the railing and this therapist's assist to get to standing.  Pt was too weak to come back down the stairs in standing.  I relayed this to daughter when we got back to the room.          Balance Overall balance assessment: Needs assistance Sitting-balance support: Feet supported;Bilateral upper extremity supported Sitting balance-Leahy Scale: Fair     Standing balance support: Bilateral upper extremity supported Standing balance-Leahy Scale: Poor                      Cognition Arousal/Alertness: Awake/alert Behavior During Therapy: WFL for tasks assessed/performed Overall Cognitive Status: Within Functional Limits for tasks assessed                      Exercises General Exercises - Lower Extremity Long Arc Quad: AROM;Both;10 reps;Seated Hip ABduction/ADduction: AROM;Both;10 reps;Seated Hip Flexion/Marching: AROM;Both;10 reps;Seated Toe Raises: AROM;Both;10  reps;Seated Heel Raises: AROM;Both;10 reps;Seated        Pertinent Vitals/Pain Pain Assessment: No/denies pain    Home Living Family/patient expects to be discharged to:: Private residence Living Arrangements: Children;Other relatives Available Help at Discharge: Family;Available 24 hours/day Type of Home: Mobile home Home Access: Stairs to enter Entrance Stairs-Rails: Right Home Layout: One level Home Equipment: Cane - single point;Wheelchair - Economist - 2 wheels      Prior Function Level of Independence: Needs assistance  Gait / Transfers Assistance Needed: walks with  cane to bathroom. Uses w/c when out of house ADL's / Homemaking Assistance Needed: Needs assist with bath and dress LB     PT Goals (current goals can now be found in the care plan section) Acute Rehab PT Goals Patient Stated Goal: Go home Progress towards PT goals: Progressing toward goals    Frequency  Min 3X/week    PT Plan Current plan remains appropriate       End of Session Equipment Utilized During Treatment: Gait belt Activity Tolerance: Patient limited by fatigue Patient left: in chair;with call bell/phone within reach;with family/visitor present     Time: 1315-1345 PT Time Calculation (min) (ACUTE ONLY): 30 min  Charges:  $Gait Training: 8-22 mins $Therapeutic Exercise: 8-22 mins                      Alberto Schoch B. Kito Cuffe, PT, DPT [phone]   06/13/2015, 2:17 PM

## 2015-06-13 NOTE — Progress Notes (Signed)
ANTICOAGULATION CONSULT NOTE  Pharmacy Consult for Coumadin Indication: atrial fibrillation  Allergies  Allergen Reactions  . Iohexol   . Ivp Dye [Iodinated Diagnostic Agents]     itching  . Bactrim Itching, Nausea And Vomiting and Rash    And diarrhea    Patient Measurements: Height: 5\' 5"  (165.1 cm) Weight: 240 lb 8 oz (109.09 kg) IBW/kg (Calculated) : 57   Assessment:   81 yr old female admitted with shoulder pain and shortness of breath, concern for angina. To continue home Coumadin for atrial fibrillation and hx PE (2012).   INR is therapeutic (2.53).  Takes Coumadin 5 mg alternating with 7.5 mg.  Daughter and grandson report that she took 7.5 mg on 06/10/15.  Takes in the evening at home, so has not had today's dose yet.  Also takes Aspirin 325 mg daily and Meloxican 15 mg qhs for arthritis.  Holding Meloxicam for now.  INR = 2.5 today  Goal of Therapy:  INR 2-3 Monitor platelets by anticoagulation protocol: Yes   Plan:  Coumadin 7.5 mg po x 1 dose tonight at 1800 pm If home today, continue home dose of Coumadin 7.5 mg alternating with 5 mg (start with 7.5 mg tonight) Daily INR in hospital Follow up INR next week if discharged   Labs:  Recent Labs  06/11/15 1405  06/11/15 1745 06/11/15 2009 06/11/15 2339 06/12/15 0324 06/12/15 1020 06/13/15 0338 06/13/15 0658  HGB 14.1  --   --   --   --   --  13.5  --   --   HCT 42.3  --   --   --   --   --  40.8  --   --   PLT 223  --   --   --   --   --  261  --   --   LABPROT  --   --  26.9*  --   --  29.0*  --  26.7*  --   INR  --   --  2.53*  --   --  2.79*  --  2.50*  --   CREATININE 1.56*  --   --   --   --   --  1.64*  --  1.42*  TROPONINI  --   < >  --  0.09* 0.10* 0.08*  --   --   --   < > = values in this interval not displayed.  Estimated Creatinine Clearance: 32.3 mL/min (by C-G formula based on Cr of 1.42).   Medical History: Past Medical History  Diagnosis Date  . Diabetes mellitus   . CHF  (congestive heart failure)   . Pulmonary embolus 03-23-11  . Arthritis     left leg  . UTI (lower urinary tract infection)   . Hypothyroidism   . Permanent atrial fibrillation      Thank you Okey Regal, PharmD [phone] 06/13/2015,10:58 AM

## 2015-06-13 NOTE — Progress Notes (Signed)
Patient Name: Sara Dennis Date of Encounter: 06/13/2015     Principal Problem:   Pulmonary edema with congestive heart failure Active Problems:   Bradycardia   Anginal pain   History of pulmonary embolism   Atrial flutter, unspecified   Demand ischemia    SUBJECTIVE  Breathing much better. She has never had LE edema, orthopnea or PND. Belly less distended. No further chest pain. No complaints.   CURRENT MEDS . donepezil  5 mg Oral BH-q7a  . furosemide  40 mg Oral Daily  . insulin aspart  0-15 Units Subcutaneous TID WC  . insulin aspart  0-5 Units Subcutaneous QHS  . insulin glargine  15 Units Subcutaneous BID  . levothyroxine  50 mcg Oral QAC breakfast  . warfarin  7.5 mg Oral ONCE-1800  . Warfarin - Pharmacist Dosing Inpatient   Does not apply q1800    OBJECTIVE  Filed Vitals:   06/12/15 2003 06/13/15 0000 06/13/15 0245 06/13/15 0643  BP: 150/67  185/57 121/96  Pulse:      Temp:  97.8 F (36.6 C)  97.9 F (36.6 C)  TempSrc:  Oral  Oral  Resp:    20  Height:      Weight:      SpO2:        Intake/Output Summary (Last 24 hours) at 06/13/15 1128 Last data filed at 06/13/15 0814  Gross per 24 hour  Intake   1080 ml  Output      0 ml  Net   1080 ml   Filed Weights   06/11/15 2100 06/12/15 0400  Weight: 257 lb (116.574 kg) 240 lb 8 oz (109.09 kg)    PHYSICAL EXAM  General: Pleasant, NAD. obese Neuro: Alert and oriented X 3. Moves all extremities spontaneously. Psych: Normal affect. HEENT:  Normal  Neck: Supple without bruits or mildly elevated JVD. Lungs:  Resp regular and unlabored, crackles at bases bilaterally. Marland Kitchen Heart: irreg irreg, brady. no s3, s4, or murmurs. Abdomen: Soft, non-tender, non-distended, BS + x 4.  Extremities: No clubbing, cyanosis or edema. DP/PT/Radials 2+ and equal bilaterally.  Accessory Clinical Findings  CBC  Recent Labs  06/11/15 1405 06/12/15 1020  WBC 9.0 8.1  HGB 14.1 13.5  HCT 42.3 40.8  MCV 80.1 79.7  PLT  223 261   Basic Metabolic Panel  Recent Labs  06/12/15 1020 06/13/15 0658  NA 138 139  K 4.7 4.5  CL 106 105  CO2 23 25  GLUCOSE 197* 152*  BUN 35* 32*  CREATININE 1.64* 1.42*  CALCIUM 8.9 9.0   Liver Function Tests No results for input(s): AST, ALT, ALKPHOS, BILITOT, PROT, ALBUMIN in the last 72 hours. No results for input(s): LIPASE, AMYLASE in the last 72 hours. Cardiac Enzymes  Recent Labs  06/11/15 2009 06/11/15 2339 06/12/15 0324  TROPONINI 0.09* 0.10* 0.08*   BNP Invalid input(s): POCBNP D-Dimer No results for input(s): DDIMER in the last 72 hours. Hemoglobin A1C  Recent Labs  06/11/15 1405  HGBA1C 7.0*    TELE  Atrial flutter with variable block. HR in 50s currently  Radiology/Studies  Dg Chest Portable 1 View  06/11/2015   CLINICAL DATA:  Myocardial infarction.  Intra for ablation.  EXAM: PORTABLE CHEST - 1 VIEW  COMPARISON:  August 14, 2013  FINDINGS: The heart size and mediastinal contours are stable. The aorta is tortuous. Heart size is enlarged. There is pulmonary edema. There is no focal pneumonia. There is probable minimal right pleural effusion.  The visualized skeletal structures are stable.  IMPRESSION: Pulmonary edema.  Probable minimal right pleural effusion.   Electronically Signed   By: Sherian Rein M.D.   On: 06/11/2015 15:27    ASSESSMENT AND PLAN Sara Dennis is a 81 y.o. female with a past medical history significant for diabetes, hypothyroidism, previous pulmonary embolism, glaucoma with significantly decreased vision and permanent atrial fibrillation/atrial flutter on coumadin who presented to [facility] for pre-syncope, diaphoresis, and chest pain and she was found to be in atrial flutter with a slow ventricular rate.  Atrial flutter with slow VR -- Not on any negative chronotropic agents, except for timolol eyedrops which may have minimal effects. This has been discontinued. -- Continue Warfarin. INR has been therapeutic. INRs are  followed by her PCP in [place] (her daughter collects them each week).  -- ASA decreased to 81 with concomitant warfarin use.  Bradycardia- seen by EP and not a candidate for pacemaker. HR in 50s now. -- It is unclear how symptomatic she is.She does have underlying conduction system disease with RBBB and LAD. He episode at home is not clearly arrhythmic but could have been related to bradycardia. She is clear in her decision to avoid invasive procedures at this time. This was felt to be reasonable with advanced age and sedentary lifestyle. For now, would hold Timolol eye drops if ok with ophthalmologist and monitor clinically.  Acute on chronic diastolic CHF - -- Placed on IV lasix. I/Os positive but weight down 17 lbs ( this is not reliable). Her creat improved with IV diuresis ( 1.56--> 1.42) -- 2D ECHO with EF 60-65%, severe LVH, no RWMA. G2DD w/ elevated filling pressure. Mod RA/RV dilation. Mod TR. PA pk pressure 73 mm Hg + RAP. The ventricular septum is d-shaped anddemonstrates incoordinate motion, suggesting pressure and volume overload of the RV. -- Now back on home po Lasix dosing  qd. She is breathing back at her baseline. She never has LE edema. She does have some crackles on ausculation and neck veins slightly elevated. 2D ECHO yesterday with evidence of volume overload. She may require additional IV diuresis. MD to see for final dispo.  Chest pain- Troponins have been mildly elevated but flat, likely consistent with demand ischemia. Given her age and risk factors, it is likely she has underlying coronary artery disease, but she is not a good candidate for invasive or noninvasive workup based on stage III chronic kidney disease and age. No further ischemic w/u   H/o PE in 2012  -- Continue coumadin as above. INR 2.5   DM2- A1c 7.0. Per primary team  CKDIII-Cr 1.56--> 1.42  (baseline ~ 1.4).   Dispo- she is new to cardiology despite her diastolic CHF and afib/flutter. I think we  should follow her on an outpatient basis to help manage her coumadin and diuretics. She was seen by Dr. Rennis Golden on admission.   Billy Fischer PA-C  Pager [phone]

## 2015-06-13 NOTE — Care Management Note (Signed)
Case Management Note  Patient Details  Name: Sara Dennis MRN: [id number] Date of Birth: [birth year]  Subjective/Objective:  Pt admitted for pulmonary edema and bradycardia. Plan for d/c home today. Pt has family support.                Action/Plan: No needs identified by CM at this time.   Expected Discharge Date:                  Expected Discharge Plan:  Home/Self Care  In-House Referral:     Discharge planning Services  CM Consult  Post Acute Care Choice:  NA Choice offered to:  NA  DME Arranged:  N/A DME Agency:  NA  HH Arranged:  NA HH Agency:  NA  Status of Service:  Completed, signed off  Medicare Important Message Given:    Date Medicare IM Given:    Medicare IM give by:    Date Additional Medicare IM Given:    Additional Medicare Important Message give by:     If discussed at Long Length of Stay Meetings, dates discussed:    Additional Comments:  Gala Lewandowsky, RN 06/13/2015, 9:48 AM

## 2015-06-13 NOTE — Progress Notes (Signed)
Family Medicine Teaching Service Daily Progress Note Intern Pager: [phone]  Patient name: Sara Dennis Medical record number: [id number] Date of birth: 04-22-1922 Age: 81 y.o. Gender: female  Primary Care Sara Dennis: Sara Kos, MD Consultants: cardiology Code Status: FULL  Assessment and Plan:  Sara Dennis is a 81 y.o. female presenting with shoulder pain, weakness and shortness of breath. PMH is significant for DM2. H/o afib, CHF, dementia, CKDIII  Shoulder discomfort- Resolved not but concerning to represent angina. Troponins elevated overnight but decreasing, EKG stable Aflutter - Admit to telemetry - trend troponins - Cardiology consulted, appreciate recs:  - elevated troponins likely demand ischemia; given age and CKDIII, not a good  candidate for invasive or noninvasive workup   - would decrease ASA to 81mg  (discontinued today see below)  - asked EP to consult with recs about possible pacemaker given pt's bradycardia  - agree with repeat ECHO - EP consulted, appreciate recs:   - would discontinue ASA with coumadin use (discontinued today)  - patient decided to avoid invasive procedures; okay to discharge home from EP  Standpoint  Diastolic CHF- Improved SOB, O2 sats to 96 on RA, Last echo 01/2013 EF 55-60%, moderate LVH mild LA dilatation. ECHO 9/15: LVEF 60-65%, severe LVH, G2DD, mod/severe pulmonary HTN, moderate TR, and mod biatrial enlargement. Weight 9/15 240 (from 257 on admission). Unknown baseline weight (per chart rev, 268lb in 03/2015).  Euvolemic on exam. S/p IV Lasix 40mg  x 2.  - Supplemental O2 to maintain sats >92% PRN - discontine IV lasix and restart home Lasix 40mg  PO daily  Atrial flutter- Stable with HR 40s-50s - coumadin per pharmacy: If home today, continue home dose of Coumadin 7.5 mg alternating with 5 mg (start with 7.5 mg tonight). Check INR next week.  - tele  H/o PE in 2012  - Continue coumadin as above  DM2- last A1c 8.6 on5/02/2013, per  report last A1c was 6.9. Takes lantus 35 BID, metformin 500 qHS, glipizide 10 mg BID. Required Novolog 14 units - Repeat A1c - continue 1/2 home lantus, 15 BID - SSI - CBG ACHS  CKDIII-Cr 1.42 (9/16) <Cr 1.64<Cr 1.56 on admission, baseline appears approx 1.4.  - at baseline  Dementia- Stable - Continue home Aricept  Weakness- Now resolved - PT: recommends HH PT however per note daughter declined - OT consult   Social- Per family they have bedside commode, wheel chair, walker and shower chair at home - No equipment recommendations by PT  FEN/GI: Heart healthy diet, SLIV Prophylaxis: Lovenox  Disposition: will be discharged likely today  Subjective:  Patient states she is doing well. Would like to go home today. Denies chest pain, SOB, palpitations.  Notes of urinary hesitancy and odor to urine for the past few days; states that she feels she has a UTI. Denies dysuria, hematuria, abdominal pain, fevers, or chills.   Objective: Temp:  [97.5 F (36.4 C)-97.9 F (36.6 C)] 97.8 F (36.6 C) (09/16 0000) Pulse Rate:  [52] 52 (09/15 1212) Resp:  [15] 15 (09/15 1212) BP: (121-185)/(57-96) 121/96 mmHg (09/16 0643); AM BP 121/96 SpO2:  [96 %] 96 % (09/15 1212) Physical Exam: General: NAD, lying in bed Cardiovascular: RRR, no murmurs auscultated Respiratory: CTAB, no wheezes or crackles auscultated Abdomen: obese, soft, non tender Extremities: no LE edema, non tender  Laboratory:  Recent Labs Lab 06/11/15 1405 06/12/15 1020  WBC 9.0 8.1  HGB 14.1 13.5  HCT 42.3 40.8  PLT 223 261    Recent Labs  Lab 06/11/15 1405 06/12/15 1020 06/13/15 0658  NA 138 138 139  K 4.8 4.7 4.5  CL 105 106 105  CO2 BUN 30* 35* 32*  CREATININE 1.56* 1.64* 1.42*  CALCIUM 9.0 8.9 9.0  GLUCOSE 165* 197* 152*    Imaging/Diagnostic Tests: ECHO 9/15: LVEF 60-65%, severe LVH, G2DD, mod/severe pulmonary HTN, moderate TR, and mod biatrial enlargement.  Sara Holter,  MD 06/13/2015, 8:40 AM PGY-1, [facility] FPTS Intern pager: [phone], text pages welcome

## 2015-06-13 NOTE — Telephone Encounter (Signed)
New Message       Pt has a TCM f/u on 06/27/15 at 10:00 w/ Wilburt Finlay PA.

## 2015-06-14 DIAGNOSIS — Z86711 Personal history of pulmonary embolism: Secondary | ICD-10-CM

## 2015-06-14 DIAGNOSIS — I248 Other forms of acute ischemic heart disease: Secondary | ICD-10-CM

## 2015-06-14 LAB — GLUCOSE, CAPILLARY
GLUCOSE-CAPILLARY: 223 mg/dL — AB (ref 65–99)
Glucose-Capillary: 165 mg/dL — ABNORMAL HIGH (ref 65–99)

## 2015-06-14 LAB — BASIC METABOLIC PANEL
Anion gap: 10 (ref 5–15)
BUN: 32 mg/dL — ABNORMAL HIGH (ref 6–20)
CO2: 25 mmol/L (ref 22–32)
Calcium: 9.4 mg/dL (ref 8.9–10.3)
Chloride: 102 mmol/L (ref 101–111)
Creatinine, Ser: 1.51 mg/dL — ABNORMAL HIGH (ref 0.44–1.00)
GFR calc Af Amer: 34 mL/min — ABNORMAL LOW (ref >60)
GFR calc non Af Amer: 29 mL/min — ABNORMAL LOW (ref >60)
Glucose, Bld: 236 mg/dL — ABNORMAL HIGH (ref 65–99)
Potassium: 4.4 mmol/L (ref 3.5–5.1)
Sodium: 137 mmol/L (ref 135–145)

## 2015-06-14 LAB — PROTIME-INR
INR: 2.06 — AB (ref 0.00–1.49)
PROTHROMBIN TIME: 23.1 s — AB (ref 11.6–15.2)

## 2015-06-14 MED ORDER — WARFARIN SODIUM 7.5 MG PO TABS: 7.5000 mg | ORAL_TABLET | Freq: Once | ORAL | Status: DC

## 2015-06-14 MED ORDER — ATORVASTATIN CALCIUM 20 MG PO TABS: 20.0000 mg | ORAL_TABLET | Freq: Every day | ORAL | Status: DC

## 2015-06-14 MED ORDER — CEPHALEXIN 500 MG PO CAPS: 500.0000 mg | ORAL_CAPSULE | Freq: Two times a day (BID) | ORAL | Status: DC

## 2015-06-14 MED ORDER — INSULIN GLARGINE 100 UNIT/ML ~~LOC~~ SOLN: 15.0000 [IU] | Freq: Two times a day (BID) | SUBCUTANEOUS | Status: DC

## 2015-06-14 NOTE — Progress Notes (Signed)
Family Medicine Teaching Service Daily Progress Note Intern Pager: [phone]  Patient name: Sara Dennis Medical record number: [id number] Date of birth: 10-Oct-1921 Age: 81 y.o. Gender: female  Primary Care Provider: Theodoro Kos, MD Consultants: cardiology Code Status: FULL  Assessment and Plan: Sara Dennis is a 81 y.o. female presenting with shoulder pain, weakness and shortness of breath. PMH is significant for DM2. H/o afib, CHF, dementia, CKDIII  Shoulder discomfort concerning for anginal equivalent- Resolved.  Troponins slightly elevated but decreasing, EKG stable atrial flutter. Thought to be secondary to demand ischemia.  - continue to monitor on telemetry - Cardiology consulted, appreciate recs:  -  given age and CKDIII, not a good candidate for invasive or noninvasive workup   - continue ASA to  in addition to coumadin - EP consulted, appreciate recs:   - would discontinue ASA with coumadin use (discontinued today)  - patient decided to avoid invasive procedures; okay to discharge home from EP standpoint  - would hold timolol eye drops if ok with opthalmology (continue to monitor for symptoms)   Diastolic CHF- Concerns of volume overload on 9/16 with crackles noted on lung exam, still present but improved on 9/17.  Improved SOB, O2 sats 100% on RA.  ECHO 9/15: LVEF 60-65%, severe LVH, G2DD, mod/severe pulmonary HTN, moderate TR, and mod biatrial enlargement. Weight down to 232 (from 257 on admission). Unknown baseline weight (per chart rev, 268lb in 03/2015). Had another dose of Lasix  IV on 9/16. .   - Supplemental O2 to maintain sats >92% PRN - discontine IV lasix and restarted home Lasix  PO daily  - Dr. Anne Fu with cardiology agrees with primary team concerning discharge home today.  - continue to watch renal function in the setting of diuresis  Urinary tract infection: Patient with urinary urgency and incontence concerning for UTI. U/A with  large  leukocyte esterase, +nitrite, TNTC WBC and few bacteria  - Started Keflex on 9/16, will discharge to complete a 7 day course (throught AM dose of 9/23)  Atrial flutter- Stable with HR 40s-50s - coumadin per pharmacy: If home today, continue home dose of Coumadin 7.5 mg alternating with 5 mg (start with 5 mg 9/17). - INR is checked at home by daughter who faxes results into her PCP, Dr. Melvyn Neth - INR check early next week.  - patient may be changing PCPs in the near future, cardiology note by Dr. Delton See on 9/16 discusses cardiology possibly managing her coumadin in the future.   H/o PE in 2012  - Continue coumadin as above  DM2- last A1c 9.0 on admission. Takes lantus 35 BID, metformin 500 qHS, glipizide 10 mg BID at home. Required Novolog 9 unitsas CBGs 236- 161 - Lantus 15 units BID; discharge with this dose with instructions increase as needed - SSI - CBG ACHS  CKDIII-Cr 1.42 (9/16) <Cr 1.64<Cr 1.56 on admission, baseline appears approx 1.51.  - at baseline  Dementia- Stable - Continue home Aricept  Weakness- Now resolved - PT: recommends HH PT, family and patient decline - OT for no follow, 24hr supervision   Social- Per family they have bedside commode, wheel chair, walker and shower chair at home - No equipment recommendations by PT  FEN/GI: Heart healthy diet, SLIV Prophylaxis: Lovenox  Disposition: will be discharged likely today  Subjective:  Patient doing much better. No cough. Breathing comfortably. Notes a very transient episode of "back weakness" and "fatigue" between her shoulder blades last evening that spontaneously resolved. Denies any  pain (chest, shoulder, or back).   Objective: Temp:  [97.5 F (36.4 C)-97.9 F (36.6 C)] 97.8 F (36.6 C) (09/16 0000) Pulse Rate:  [52] 52 (09/15 1212) Resp:  [15] 15 (09/15 1212) BP: (121-185)/(57-96) 121/96 mmHg (09/16 0643); AM BP 121/96 SpO2:  [96 %] 96 % (09/15 1212) Physical Exam: General: NAD, sitting up in bed.   Cardiovascular: RRR, no murmurs auscultated, possibly mild JVD,  Respiratory: CTAB, no wheezes noted. Mild crackles in the bases bilaterally.  Abdomen: obese, +BS, soft, non tender Extremities: no LE edema, non tender  Laboratory:  Recent Labs Lab 06/11/15 1405 06/12/15 1020  WBC 9.0 8.1  HGB 14.1 13.5  HCT 42.3 40.8  PLT 223 261    Recent Labs Lab 06/11/15 1405 06/12/15 1020 06/13/15 0658  NA 138 138 139  K 4.8 4.7 4.5  CL 105 106 105  CO2 22 23 25   BUN 30* 35* 32*  CREATININE 1.56* 1.64* 1.42*  CALCIUM 9.0 8.9 9.0  GLUCOSE 165* 197* 152*   U/A: large leukocyte esterase, +nitrite, TNTC WBC and few bacteria   Imaging/Diagnostic Tests: ECHO 9/15: LVEF 60-65%, severe LVH, G2DD, mod/severe pulmonary HTN, moderate TR, and mod biatrial enlargement.  Joanna Puff, MD 06/14/2015, 1:13 AM PGY-2, [employer] Family Medicine FPTS Intern pager: [phone], text pages welcome

## 2015-06-14 NOTE — Progress Notes (Signed)
ANTICOAGULATION CONSULT NOTE - Follow Up Consult  Pharmacy Consult for Warfarin Indication: atrial fibrillation  Allergies  Allergen Reactions  . Iohexol   . Ivp Dye [Iodinated Diagnostic Agents]     itching  . Bactrim Itching, Nausea And Vomiting and Rash    And diarrhea    Patient Measurements: Height: 5\' 5"  (165.1 cm) Weight: 232 lb 12.8 oz (105.597 kg) IBW/kg (Calculated) : 57  Vital Signs: Temp: 98.1 F (36.7 C) (09/17 0500) BP: 126/53 mmHg (09/17 0500) Pulse Rate: 55 (09/17 0500)  Labs:  Recent Labs  06/11/15 1405  06/11/15 2009 06/11/15 2339 06/12/15 0324 06/12/15 1020 06/13/15 0338 06/13/15 0658 06/14/15 0800 06/14/15 0830  HGB 14.1  --   --   --   --  13.5  --   --   --   --   HCT 42.3  --   --   --   --  40.8  --   --   --   --   PLT 223  --   --   --   --  261  --   --   --   --   LABPROT  --   < >  --   --  29.0*  --  26.7*  --  23.1*  --   INR  --   < >  --   --  2.79*  --  2.50*  --  2.06*  --   CREATININE 1.56*  --   --   --   --  1.64*  --  1.42*  --  1.51*  TROPONINI  --   < > 0.09* 0.10* 0.08*  --   --   --   --   --   < > = values in this interval not displayed.  Estimated Creatinine Clearance: 29.9 mL/min (by C-G formula based on Cr of 1.51).   Medications:  Scheduled:  . atorvastatin  20 mg Oral q1800  . cephALEXin  500 mg Oral Q12H  . donepezil  5 mg Oral BH-q7a  . furosemide  40 mg Oral Daily  . insulin aspart  0-15 Units Subcutaneous TID WC  . insulin aspart  0-5 Units Subcutaneous QHS  . insulin glargine  15 Units Subcutaneous BID  . levothyroxine  50 mcg Oral QAC breakfast  . Warfarin - Pharmacist Dosing Inpatient   Does not apply q1800   Infusions:   PRN: acetaminophen, ondansetron (ZOFRAN) IV  Assessment:  81 yr old female admitted with shoulder pain and shortness of breath, concern for angina. To continue home Coumadin for atrial fibrillation and hx PE (2012). INR is therapeutic (2.06) x 4. Takes Coumadin 5 mg  alternating with 7.5 mg. Daughter and grandson report that she took 7.5 mg on 06/10/15.  9/15 Hgb 13.5, Plts 261.    Goal of Therapy:  INR 2-3 Monitor platelets by anticoagulation protocol: Yes   Plan:   Coumadin 7.5 mg po x 1 tonight   Daily PT/INR CBC q3d Monitor for s/sx of bleeding  Hazle Nordmann, PharmD Pharmacy Resident [phone]

## 2015-06-14 NOTE — Care Management Note (Addendum)
Case Management Note  Patient Details  Name: Sara Dennis MRN: [id number] Date of Birth: June 13, 1922  Subjective/Objective:                  weakness and shortness of breath  Action/Plan:  Discharge planning Expected Discharge Date:  06/14/15               Expected Discharge Plan:  [place]  In-House Referral:     Discharge planning Services  CM Consult  Post Acute Care Choice:  Home Health Choice offered to:  Patient  DME Arranged:  N/A DME Agency:  NA  HH Arranged:  Patient Refused [place] Agency:  NA  Status of Service:  Completed, signed off  Medicare Important Message Given:  Yes-second notification given Date Medicare IM Given:    Medicare IM give by:    Date Additional Medicare IM Given:    Additional Medicare Important Message give by:     If discussed at [place] of Stay Meetings, dates discussed:    Additional Comments: 12:27 CM received call from RN for med asst.  Pt's medicaid has expired and daughter states she is taking care of this on Monday but in the meantime her two new prescriptions are not covered.  CM gave family a coupon for Lipitor and Keflex is on the $4 list at [facility].  CM also informed the family of Jim Desanctis p[harmacy free diabetic oral medications and diabetic supplies but daughter states when pt's Medicaid is reinstated, these supplies and medications are free.  No other CM needs were communicated. CM met with pt in room to offer choice of home health agency.  Pt lives with her daughter, Rochel Brome, and states her daughter and grandson take care of her and she politely refuses all HH services bc, as she states, "I get enough PT every day at home just getting around."  When asked if she'd like me to call her daughter, she stated, "No thank you."  Pt states she has all the DME she needs at home including rolling walker, bedside commode and shower stool.  NO other CM needs were communicated. Dellie Catholic,  RN 06/14/2015, 10:15 AM

## 2015-06-14 NOTE — Progress Notes (Signed)
Pt c/o "tiredness" across her back and SOB.. B/p125/107, hr51. EKG unremarkable and oxygen applied. Pt states "tiredness"  Across back resolved before EKG complete. Will continue to monitor pt.

## 2015-06-14 NOTE — Discharge Instructions (Signed)
Alternate coumadin dosing between 5mg  and 7.5mg : Start taking 5mg  on the day of discharge (9/17), then 7.5mg  on 9/18, and so forth. Check your INR on Tuesday and contact your PCP for any possible changes in dosing  Continue to take Keflex for the urinary tract infection- STOP taking Trimethoprim and restart it once you've completed the Keflex.  We have decreased your Lantus to 15 units twice daily while you were here, this can be increased by your PCP.

## 2015-06-14 NOTE — Progress Notes (Addendum)
Patient Name: Sara Dennis Date of Encounter: 06/14/2015     Principal Problem:   Pulmonary edema with congestive heart failure Active Problems:   Bradycardia   Anginal pain   History of pulmonary embolism   Atrial flutter, unspecified   Demand ischemia    SUBJECTIVE  Had some mid back pain over the night. She has this occasionally at home. Breathing much better. She has never had LE edema, orthopnea or PND. Belly less distended. No further chest pain. No complaints.   CURRENT MEDS . atorvastatin  20 mg Oral q1800  . cephALEXin  500 mg Oral Q12H  . donepezil  5 mg Oral BH-q7a  . furosemide  40 mg Oral Daily  . insulin aspart  0-15 Units Subcutaneous TID WC  . insulin aspart  0-5 Units Subcutaneous QHS  . insulin glargine  15 Units Subcutaneous BID  . levothyroxine  50 mcg Oral QAC breakfast  . Warfarin - Pharmacist Dosing Inpatient   Does not apply q1800    OBJECTIVE  Filed Vitals:   06/13/15 1718 06/13/15 2000 06/13/15 2326 06/14/15 0500  BP: 132/59 128/34 91/56 126/53  Pulse:  51 62 55  Temp: 97.8 F (36.6 C) 98 F (36.7 C) 98.6 F (37 C) 98.1 F (36.7 C)  TempSrc: Oral     Resp: Height:      Weight:    232 lb 12.8 oz (105.597 kg)  SpO2: 98% 97% 97% 100%    Intake/Output Summary (Last 24 hours) at 06/14/15 0942 Last data filed at 06/13/15 1720  Gross per 24 hour  Intake    480 ml  Output      0 ml  Net    480 ml   Filed Weights   06/11/15 2100 06/12/15 0400 06/14/15 0500  Weight: 257 lb (116.574 kg) 240 lb 8 oz (109.09 kg) 232 lb 12.8 oz (105.597 kg)    PHYSICAL EXAM  General: Pleasant, NAD. obese Neuro: Alert and oriented X 3. Moves all extremities spontaneously. Psych: Normal affect. HEENT:  Normal  Neck: Supple without bruits or mildly elevated JVD. Lungs:  Resp regular and unlabored, crackles at bases bilaterally. Marland Kitchen Heart: irreg irreg, brady. no s3, s4, or murmurs. Abdomen: Soft, non-tender, non-distended, BS + x 4.    Extremities: No clubbing, cyanosis or edema. DP/PT/Radials 2+ and equal bilaterally.  Accessory Clinical Findings  CBC  Recent Labs  06/11/15 1405 06/12/15 1020  WBC 9.0 8.1  HGB 14.1 13.5  HCT 42.3 40.8  MCV 80.1 79.7  PLT 223 261   Basic Metabolic Panel  Recent Labs  06/13/15 0658 06/14/15 0830  NA 139 137  K 4.5 4.4  CL 105 102  CO2 25 25  GLUCOSE 152* 236*  BUN 32* 32*  CREATININE 1.42* 1.51*  CALCIUM 9.0 9.4   Liver Function Tests No results for input(s): AST, ALT, ALKPHOS, BILITOT, PROT, ALBUMIN in the last 72 hours. No results for input(s): LIPASE, AMYLASE in the last 72 hours. Cardiac Enzymes  Recent Labs  06/11/15 2009 06/11/15 2339 06/12/15 0324  TROPONINI 0.09* 0.10* 0.08*   BNP Invalid input(s): POCBNP D-Dimer No results for input(s): DDIMER in the last 72 hours. Hemoglobin A1C  Recent Labs  06/11/15 1405  HGBA1C 7.0*    TELE  Atrial flutter with variable block. HR in 60s currently  Radiology/Studies  Dg Chest Portable 1 View  06/11/2015   CLINICAL DATA:  Myocardial infarction.  Intra for ablation.  EXAM: PORTABLE  CHEST - 1 VIEW  COMPARISON:  August 14, 2013  FINDINGS: The heart size and mediastinal contours are stable. The aorta is tortuous. Heart size is enlarged. There is pulmonary edema. There is no focal pneumonia. There is probable minimal right pleural effusion. The visualized skeletal structures are stable.  IMPRESSION: Pulmonary edema.  Probable minimal right pleural effusion.   Electronically Signed   By: Sherian Rein M.D.   On: 06/11/2015 15:27    ASSESSMENT AND PLAN CLYTEE MAJUMDAR is a 81 y.o. female with a past medical history significant for diabetes, hypothyroidism, previous pulmonary embolism, glaucoma with significantly decreased vision and permanent atrial fibrillation/atrial flutter on coumadin who presented to [facility] for pre-syncope, diaphoresis, and chest pain and she was found to be in atrial flutter with a slow  ventricular rate.  Atrial flutter with slow VR -- Not on any negative chronotropic agents, except for timolol eyedrops which may have minimal effects. This has been discontinued. -- Continue Warfarin. INR has been therapeutic. INRs are followed by her PCP in [place] (her daughter collects them each week).  -- ASA stopped with concomitant warfarin use.  Bradycardia- seen by EP and not a candidate for pacemaker. HR in 50-60s now. -- It is unclear how symptomatic she is.She does have underlying conduction system disease with RBBB and LAD. Her episode at home is not clearly arrhythmic but could have been related to bradycardia. She is clear in her decision to avoid invasive procedures at this time. This was felt to be reasonable with advanced age and sedentary lifestyle. For now, would hold Timolol eye drops if ok with ophthalmologist and monitor clinically.  Acute on chronic diastolic CHF - -- Placed on IV lasix. I/Os positive but weight down 17 lbs ( this is not reliable). Her creat improved with IV diuresis ( 1.56--> 1.42) -- 2D ECHO with EF 60-65%, severe LVH, no RWMA. G2DD w/ elevated filling pressure. Mod RA/RV dilation. Mod TR. PA pk pressure 73 mm Hg + RAP. The ventricular septum is d-shaped anddemonstrates incoordinate motion, suggesting pressure and volume overload of the RV. -- Now back on home po Lasix dosing 40mg  qd. She is breathing back at her baseline. She never has LE edema. She does have some crackles on ausculation and neck veins slightly elevated. 2D ECHO yesterday with evidence of volume overload.   Chest pain- Troponins have been mildly elevated but flat, likely consistent with demand ischemia. Given her age and risk factors, it is likely she has underlying coronary artery disease, but she is not a good candidate for invasive or noninvasive workup based on stage III chronic kidney disease and age. No further ischemic w/u   H/o PE in 2012  -- Continue coumadin as above. INR 2.5    DM2- A1c 7.0. Per primary team  CKDIII-Cr 1.56--> 1.42  (baseline ~ 1.4).   Dispo- she is new to cardiology despite her diastolic CHF and afib/flutter.Will follow her on an outpatient basis. She was seen by Dr. Rennis Golden on admission.   Hopeful discharge soon. Okay from our standpoint  Signed, Donato Schultz, MD

## 2015-06-16 NOTE — Telephone Encounter (Signed)
TCM call.  Patient does not get moving very well in the morning.  Could you schedule her appointment later in the day?

## 2015-06-26 ENCOUNTER — Encounter: Payer: Self-pay | Admitting: Physician Assistant

## 2015-06-26 NOTE — Progress Notes (Signed)
Cardiology Office Note Date:  06/27/2015  Patient ID:  Sara Dennis, Sara Dennis 04/10/1922, MRN [id number] PCP:  Theodoro Kos, MD  Cardiologist:  Dr. Rennis Golden  Chief Complaint: f/u hospital stay for low HR  History of Present Illness: Sara Dennis is a 81 y.o. female with history of type 2 diabetes, hypothyroidism, h/o recurrent PE (1978 & 2012), glaucoma with significantly decreased vision, permanent atrial fibrillation/atrial flutter on Coumadin, chronic diastolic CHF, CKD stage III, mild dementia, mod-severe pulm HTN by echo 05/2015, RBBB, & recent bradycardia who presents for post-hospital follow-up. She was recently admitted to [facility] with shoulder pain, weakness, and SOB and was found to have atypical atrial flutter with slow ventricular response with HR in the 40s-50s, occasionally dipping into the 30s overnight. INR was therapeutic. She was found to have minimally elevated flat troponin (0.1) felt more likely due to demand ischemia in the setting of concomitant pulmonary edema. She was treated with IV Lasix. She was seen by EP and it was felt unclear how symptomatic she was related to her HR - the patient was clear in her decision to avoid invasive procedures at this time. ASA was discontinued per cardiology due to concomitant warfarin use, and timolol eye drops were also discontinued to avoid negative chronotropic agents- she was instructed to discuss eye drop alternatives with doctor that prescribed these. She was also tx for UTI. 2D Echo 06/12/15: severe LVH, EF 60-65%, stage 2 DD, moderate-severe pulmonary HTN, mod TR, mod bi-atrial enlargement.  She comes in today for follow-up with her daughter and grandson. Everyone agrees she is doing well - she has felt much better since discharge. Her weight is up in the office today but she is wearing heavy shoes and has not noticed any signs of volume overload. She watches salt intake. No SOB, orthopnea, LEE, dizziness, chest pain, syncope or bleeding. Uses  cane to get around at home.  Past Medical History  Diagnosis Date  . Diabetes mellitus   . Chronic diastolic CHF (congestive heart failure)   . Pulmonary embolus 03-23-11    a. 1978 and 2012.  . Arthritis     left leg  . UTI (lower urinary tract infection)   . Hypothyroidism   . Permanent atrial fibrillation   . Glaucoma     a. with significantly decreased vision.  . Chronic atrial flutter   . Bradycardia     a. 05/2015: HR 40s-50s, occ 30s at night - not clear how much symptoms were r/t this, patient declined PPM at that time.  . CKD (chronic kidney disease), stage III   . Mild dementia   . Moderate to severe pulmonary hypertension By echo 05/2015  . RBBB     Past Surgical History  Procedure Laterality Date  . Kisney stone removal  1978    removed thru bladder  . Cystoscopy w/ ureteral stent placement  09/06/2011    Procedure: CYSTOSCOPY WITH RETROGRADE PYELOGRAM/URETERAL STENT PLACEMENT;  Surgeon: Valetta Fuller, MD;  Location: [facility] ORS;  Service: Urology;  Laterality: Right;  cysto double j stent placement  . Ureteroscopy  09/06/2011    Procedure: URETEROSCOPY;  Surgeon: Valetta Fuller, MD;  Location: [facility] ORS;  Service: Urology;  Laterality: Right;    Current Outpatient Prescriptions  Medication Sig Dispense Refill  . atorvastatin (LIPITOR) 20 MG tablet Take 1 tablet (20 mg total) by mouth daily at 6 PM. 30 tablet 0  . beta carotene w/minerals (OCUVITE) tablet Take 1 tablet by mouth daily.    Marland Kitchen  donepezil (ARICEPT) 5 MG tablet Take 5 mg by mouth every morning. Prefers to take in the morning    . furosemide (LASIX) 40 MG tablet Take 40 mg by mouth every morning.     Marland Kitchen glipiZIDE (GLUCOTROL) 10 MG tablet Take 10 mg by mouth 2 (two) times daily before a meal.    . insulin glargine (LANTUS) 100 UNIT/ML injection Inject 0.15 mLs (15 Units total) into the skin 2 (two) times daily. 10 mL 11  . levothyroxine (SYNTHROID, LEVOTHROID) 50 MCG tablet Take 50 mcg by mouth daily before  breakfast.    . meloxicam (MOBIC) 15 MG tablet Take 15 mg by mouth at bedtime.     . metFORMIN (GLUCOPHAGE-XR) 500 MG 24 hr tablet Take 500 mg by mouth at bedtime.     Marland Kitchen warfarin (COUMADIN) 5 MG tablet Take 5-7.5 mg by mouth daily at 6 PM. Alternates 5 mg with 7.5 mg     No current facility-administered medications for this visit.    Allergies:   Iohexol; Ivp dye; Bactrim; and Sulfamethoxazole-trimethoprim   Social History:  The patient  reports that she has never smoked. She has never used smokeless tobacco. She reports that she does not drink alcohol or use illicit drugs.   Family History:  The patient's family history includes Hypertension in an other family member.  ROS:  Please see the history of present illness.  All other systems are reviewed and otherwise negative.   PHYSICAL EXAM:  VS:  BP 130/64 mmHg  Pulse 53  Ht  (1.651 m)  Wt 240 lb 6.4 oz (109.045 kg)  BMI 40.00 kg/m2 BMI: Body mass index is 40 kg/(m^2). Well nourished, well developed obese elderly AAF in no acute distress HEENT: normocephalic, atraumatic Neck: no JVD, carotid bruits or masses Cardiac:  normal S1, S2; irregularly irregular; no murmurs, rubs, or gallops Lungs:  clear to auscultation bilaterally, no wheezing, rhonchi or rales Abd: soft, nontender, no hepatomegaly, + BS MS: no deformity or atrophy Ext: no edema Skin: warm and dry, no rash Neuro:  moves all extremities spontaneously, no focal abnormalities noted, follows commands Psych: euthymic mood, full affect   EKG:  Done today shows atrial flutter 53bpm, RBBB, LAFB, no sig change from prior  Recent Labs: 06/12/2015: Hemoglobin 13.5; Platelets 261 06/14/2015: BUN 32*; Creatinine, Ser 1.51*; Potassium 4.4; Sodium 137  No results found for requested labs within last 365 days.   Estimated Creatinine Clearance: 30.4 mL/min (by C-G formula based on Cr of 1.51).   Wt Readings from Last 3 Encounters:  06/27/15 240 lb 6.4 oz (109.045 kg)    06/14/15 232 lb 12.8 oz (105.597 kg)  05/06/13 232 lb 3.2 oz (105.325 kg)     Other studies reviewed: Additional studies/records reviewed today include: summarized above  ASSESSMENT AND PLAN:  1. Chronic atrial flutter/atrial fibrillation with bradycardia - being managed conservatively. HR is in the 50's today. She has had no recurrent symptoms. INRs are followed by PCP per daughter. No bleeding reported. 2. Chronic diastolic CHF - weight up in clinic but she is wearing heavy shoes and overall feels "great." Volume status appears fine on exam. Since she is feeling well, will not make any medicine changes at this time. (Must be cautious given chronic renal disease.) We discussed reinforcement of daily weights and fluid restriction. Her daughter inquired about a handicapped placard for her mother - I think this is completely appropriate. I filled out paperwork today. 3. CKD stage III - further monitoring  per PCP. I asked her daughter to discuss meds Meloxicam and Metformin with primary care since these are both less ideal with patients with CKD. 4. Mod-severe pulmonary HTN - likely 2/2 #2. Conservative therapy recommended. 5. Hypothyroidism - no recent TSH. She will not see PCP for another month. Will check TSH given recent bradycardia.  Disposition: F/u with Dr. Rennis Golden in 3 months.  Current medicines are reviewed at length with the patient today.  The patient did not have any concerns regarding medicines.  Thomasene Mohair PA-C 06/27/2015 1:48 PM     [facility] [address] [place], [place] [address] Phone: [phone]

## 2015-06-27 ENCOUNTER — Ambulatory Visit (INDEPENDENT_AMBULATORY_CARE_PROVIDER_SITE_OTHER): Payer: Medicare Other | Admitting: Physician Assistant

## 2015-06-27 ENCOUNTER — Encounter: Payer: Self-pay | Admitting: Physician Assistant

## 2015-06-27 ENCOUNTER — Encounter: Payer: Medicare Other | Admitting: Physician Assistant

## 2015-06-27 VITALS — BP 130/64 | HR 53 | Ht 65.0 in | Wt 240.4 lb

## 2015-06-27 DIAGNOSIS — I5032 Chronic diastolic (congestive) heart failure: Secondary | ICD-10-CM | POA: Diagnosis not present

## 2015-06-27 DIAGNOSIS — I482 Chronic atrial fibrillation, unspecified: Secondary | ICD-10-CM

## 2015-06-27 DIAGNOSIS — I272 Pulmonary hypertension, unspecified: Secondary | ICD-10-CM

## 2015-06-27 DIAGNOSIS — N183 Chronic kidney disease, stage 3 unspecified: Secondary | ICD-10-CM

## 2015-06-27 DIAGNOSIS — I270 Primary pulmonary hypertension: Secondary | ICD-10-CM

## 2015-06-27 DIAGNOSIS — E039 Hypothyroidism, unspecified: Secondary | ICD-10-CM

## 2015-06-27 DIAGNOSIS — I4892 Unspecified atrial flutter: Secondary | ICD-10-CM | POA: Diagnosis not present

## 2015-06-27 DIAGNOSIS — R001 Bradycardia, unspecified: Secondary | ICD-10-CM

## 2015-06-27 NOTE — Patient Instructions (Signed)
Your physician wants you to follow-up in: 3 Months with Dr Rennis Golden. You will receive a reminder letter in the mail two months in advance. If you don't receive a letter, please call our office to schedule the follow-up appointment.  Your physician recommends that you return for lab work Today TSH

## 2015-06-28 LAB — TSH: TSH: 1.985 u[IU]/mL (ref 0.350–4.500)

## 2015-08-10 ENCOUNTER — Other Ambulatory Visit: Payer: Self-pay | Admitting: Family Medicine

## 2015-08-11 ENCOUNTER — Other Ambulatory Visit: Payer: Self-pay | Admitting: Internal Medicine

## 2015-08-11 MED ORDER — ATORVASTATIN CALCIUM 20 MG PO TABS: 20.0000 mg | ORAL_TABLET | Freq: Every day | ORAL | Status: AC

## 2015-09-06 ENCOUNTER — Inpatient Hospital Stay (HOSPITAL_COMMUNITY)
Admission: EM | Admit: 2015-09-06 | Discharge: 2015-09-10 | DRG: 091 | Disposition: A | Discharge status: Skilled Nursing Facility | Payer: Medicare HMO | Attending: Internal Medicine | Admitting: Internal Medicine

## 2015-09-06 ENCOUNTER — Encounter (HOSPITAL_COMMUNITY): Payer: Self-pay | Admitting: Emergency Medicine

## 2015-09-06 ENCOUNTER — Emergency Department (HOSPITAL_COMMUNITY): Payer: Medicare HMO

## 2015-09-06 DIAGNOSIS — E860 Dehydration: Secondary | ICD-10-CM | POA: Diagnosis present

## 2015-09-06 DIAGNOSIS — I272 Other secondary pulmonary hypertension: Secondary | ICD-10-CM | POA: Diagnosis present

## 2015-09-06 DIAGNOSIS — M109 Gout, unspecified: Secondary | ICD-10-CM | POA: Diagnosis present

## 2015-09-06 DIAGNOSIS — G92 Toxic encephalopathy: Principal | ICD-10-CM | POA: Diagnosis present

## 2015-09-06 DIAGNOSIS — Z882 Allergy status to sulfonamides status: Secondary | ICD-10-CM | POA: Diagnosis not present

## 2015-09-06 DIAGNOSIS — Z7901 Long term (current) use of anticoagulants: Secondary | ICD-10-CM | POA: Diagnosis not present

## 2015-09-06 DIAGNOSIS — J189 Pneumonia, unspecified organism: Secondary | ICD-10-CM

## 2015-09-06 DIAGNOSIS — F0390 Unspecified dementia without behavioral disturbance: Secondary | ICD-10-CM | POA: Diagnosis present

## 2015-09-06 DIAGNOSIS — E1122 Type 2 diabetes mellitus with diabetic chronic kidney disease: Secondary | ICD-10-CM | POA: Diagnosis present

## 2015-09-06 DIAGNOSIS — T45511A Poisoning by anticoagulants, accidental (unintentional), initial encounter: Secondary | ICD-10-CM

## 2015-09-06 DIAGNOSIS — Z8249 Family history of ischemic heart disease and other diseases of the circulatory system: Secondary | ICD-10-CM

## 2015-09-06 DIAGNOSIS — I5032 Chronic diastolic (congestive) heart failure: Secondary | ICD-10-CM | POA: Diagnosis present

## 2015-09-06 DIAGNOSIS — T45515A Adverse effect of anticoagulants, initial encounter: Secondary | ICD-10-CM | POA: Diagnosis present

## 2015-09-06 DIAGNOSIS — Z888 Allergy status to other drugs, medicaments and biological substances status: Secondary | ICD-10-CM | POA: Diagnosis not present

## 2015-09-06 DIAGNOSIS — T40605A Adverse effect of unspecified narcotics, initial encounter: Secondary | ICD-10-CM | POA: Diagnosis present

## 2015-09-06 DIAGNOSIS — I209 Angina pectoris, unspecified: Secondary | ICD-10-CM

## 2015-09-06 DIAGNOSIS — Z91041 Radiographic dye allergy status: Secondary | ICD-10-CM | POA: Diagnosis not present

## 2015-09-06 DIAGNOSIS — S8251XA Displaced fracture of medial malleolus of right tibia, initial encounter for closed fracture: Secondary | ICD-10-CM | POA: Diagnosis present

## 2015-09-06 DIAGNOSIS — Z881 Allergy status to other antibiotic agents status: Secondary | ICD-10-CM

## 2015-09-06 DIAGNOSIS — E871 Hypo-osmolality and hyponatremia: Secondary | ICD-10-CM | POA: Diagnosis present

## 2015-09-06 DIAGNOSIS — H409 Unspecified glaucoma: Secondary | ICD-10-CM | POA: Diagnosis present

## 2015-09-06 DIAGNOSIS — G9340 Encephalopathy, unspecified: Secondary | ICD-10-CM | POA: Diagnosis not present

## 2015-09-06 DIAGNOSIS — I4892 Unspecified atrial flutter: Secondary | ICD-10-CM | POA: Diagnosis present

## 2015-09-06 DIAGNOSIS — Z79899 Other long term (current) drug therapy: Secondary | ICD-10-CM | POA: Diagnosis not present

## 2015-09-06 DIAGNOSIS — N183 Chronic kidney disease, stage 3 unspecified: Secondary | ICD-10-CM

## 2015-09-06 DIAGNOSIS — I248 Other forms of acute ischemic heart disease: Secondary | ICD-10-CM

## 2015-09-06 DIAGNOSIS — I5033 Acute on chronic diastolic (congestive) heart failure: Secondary | ICD-10-CM | POA: Diagnosis present

## 2015-09-06 DIAGNOSIS — M1990 Unspecified osteoarthritis, unspecified site: Secondary | ICD-10-CM | POA: Diagnosis present

## 2015-09-06 DIAGNOSIS — N390 Urinary tract infection, site not specified: Secondary | ICD-10-CM | POA: Diagnosis not present

## 2015-09-06 DIAGNOSIS — D689 Coagulation defect, unspecified: Secondary | ICD-10-CM

## 2015-09-06 DIAGNOSIS — I482 Chronic atrial fibrillation: Secondary | ICD-10-CM | POA: Diagnosis present

## 2015-09-06 DIAGNOSIS — D6832 Hemorrhagic disorder due to extrinsic circulating anticoagulants: Secondary | ICD-10-CM | POA: Diagnosis present

## 2015-09-06 DIAGNOSIS — E039 Hypothyroidism, unspecified: Secondary | ICD-10-CM | POA: Diagnosis present

## 2015-09-06 DIAGNOSIS — I501 Left ventricular failure: Secondary | ICD-10-CM

## 2015-09-06 DIAGNOSIS — R001 Bradycardia, unspecified: Secondary | ICD-10-CM

## 2015-09-06 DIAGNOSIS — J810 Acute pulmonary edema: Secondary | ICD-10-CM | POA: Diagnosis not present

## 2015-09-06 DIAGNOSIS — I129 Hypertensive chronic kidney disease with stage 1 through stage 4 chronic kidney disease, or unspecified chronic kidney disease: Secondary | ICD-10-CM | POA: Diagnosis present

## 2015-09-06 DIAGNOSIS — R0902 Hypoxemia: Secondary | ICD-10-CM

## 2015-09-06 DIAGNOSIS — Z9181 History of falling: Secondary | ICD-10-CM

## 2015-09-06 DIAGNOSIS — R791 Abnormal coagulation profile: Secondary | ICD-10-CM | POA: Diagnosis present

## 2015-09-06 DIAGNOSIS — I4510 Unspecified right bundle-branch block: Secondary | ICD-10-CM | POA: Diagnosis present

## 2015-09-06 DIAGNOSIS — M10371 Gout due to renal impairment, right ankle and foot: Secondary | ICD-10-CM | POA: Diagnosis not present

## 2015-09-06 DIAGNOSIS — S8253XA Displaced fracture of medial malleolus of unspecified tibia, initial encounter for closed fracture: Secondary | ICD-10-CM | POA: Insufficient documentation

## 2015-09-06 DIAGNOSIS — H547 Unspecified visual loss: Secondary | ICD-10-CM | POA: Diagnosis present

## 2015-09-06 DIAGNOSIS — R52 Pain, unspecified: Secondary | ICD-10-CM

## 2015-09-06 DIAGNOSIS — Z794 Long term (current) use of insulin: Secondary | ICD-10-CM | POA: Diagnosis not present

## 2015-09-06 DIAGNOSIS — E119 Type 2 diabetes mellitus without complications: Secondary | ICD-10-CM

## 2015-09-06 DIAGNOSIS — Z86711 Personal history of pulmonary embolism: Secondary | ICD-10-CM | POA: Diagnosis not present

## 2015-09-06 DIAGNOSIS — N179 Acute kidney failure, unspecified: Secondary | ICD-10-CM | POA: Diagnosis not present

## 2015-09-06 DIAGNOSIS — R410 Disorientation, unspecified: Secondary | ICD-10-CM | POA: Diagnosis not present

## 2015-09-06 DIAGNOSIS — N201 Calculus of ureter: Secondary | ICD-10-CM

## 2015-09-06 DIAGNOSIS — E1169 Type 2 diabetes mellitus with other specified complication: Secondary | ICD-10-CM

## 2015-09-06 DIAGNOSIS — N189 Chronic kidney disease, unspecified: Secondary | ICD-10-CM | POA: Diagnosis present

## 2015-09-06 LAB — URINALYSIS, ROUTINE W REFLEX MICROSCOPIC
BILIRUBIN URINE: NEGATIVE
GLUCOSE, UA: NEGATIVE mg/dL
Ketones, ur: NEGATIVE mg/dL
Nitrite: POSITIVE — AB
Protein, ur: NEGATIVE mg/dL
SPECIFIC GRAVITY, URINE: 1.015 (ref 1.005–1.030)
pH: 5 (ref 5.0–8.0)

## 2015-09-06 LAB — URINE MICROSCOPIC-ADD ON
RBC / HPF: NONE SEEN RBC/hpf (ref 0–5)
Squamous Epithelial / LPF: NONE SEEN

## 2015-09-06 LAB — I-STAT CHEM 8, ED
BUN: 22 mg/dL — ABNORMAL HIGH (ref 6–20)
Calcium, Ion: 1.08 mmol/L — ABNORMAL LOW (ref 1.13–1.30)
Chloride: 105 mmol/L (ref 101–111)
Creatinine, Ser: 1.2 mg/dL — ABNORMAL HIGH (ref 0.44–1.00)
Glucose, Bld: 169 mg/dL — ABNORMAL HIGH (ref 65–99)
HCT: 41 % (ref 36.0–46.0)
Hemoglobin: 13.9 g/dL (ref 12.0–15.0)
Potassium: 4 mmol/L (ref 3.5–5.1)
Sodium: 141 mmol/L (ref 135–145)
TCO2: 23 mmol/L (ref 0–100)

## 2015-09-06 LAB — CBC WITH DIFFERENTIAL/PLATELET
BASOS ABS: 0.1 10*3/uL (ref 0.0–0.1)
BASOS PCT: 1 %
EOS ABS: 0.1 10*3/uL (ref 0.0–0.7)
EOS PCT: 1 %
HEMATOCRIT: 37.1 % (ref 36.0–46.0)
Hemoglobin: 12.1 g/dL (ref 12.0–15.0)
Lymphocytes Relative: 15 %
Lymphs Abs: 1.5 10*3/uL (ref 0.7–4.0)
MCH: 25.7 pg — AB (ref 26.0–34.0)
MCHC: 32.6 g/dL (ref 30.0–36.0)
MCV: 78.9 fL (ref 78.0–100.0)
Monocytes Absolute: 1.2 10*3/uL — ABNORMAL HIGH (ref 0.1–1.0)
Monocytes Relative: 12 %
Neutro Abs: 7.6 10*3/uL (ref 1.7–7.7)
Neutrophils Relative %: 73 %
PLATELETS: 269 10*3/uL (ref 150–400)
RBC: 4.7 MIL/uL (ref 3.87–5.11)
RDW: 14.8 % (ref 11.5–15.5)
WBC: 10.5 10*3/uL (ref 4.0–10.5)

## 2015-09-06 MED ORDER — ONDANSETRON HCL 4 MG PO TABS
4.0000 mg | ORAL_TABLET | Freq: Four times a day (QID) | ORAL | Status: DC | PRN
Start: 2015-09-06 — End: 2015-09-10

## 2015-09-06 MED ORDER — OXYCODONE HCL 5 MG PO TABS
5.0000 mg | ORAL_TABLET | ORAL | Status: DC | PRN
Start: 2015-09-06 — End: 2015-09-08
  Administered 2015-09-07 – 2015-09-08 (×3): 5 mg via ORAL
  Filled 2015-09-06 (×3): qty 1

## 2015-09-06 MED ORDER — LEVOTHYROXINE SODIUM 50 MCG PO TABS
50.0000 ug | ORAL_TABLET | Freq: Every day | ORAL | Status: DC
  Administered 2015-09-07 – 2015-09-10 (×4): 50 ug via ORAL
  Filled 2015-09-06 (×4): qty 1

## 2015-09-06 MED ORDER — ACETAMINOPHEN 325 MG PO TABS
650.0000 mg | ORAL_TABLET | Freq: Four times a day (QID) | ORAL | Status: DC | PRN
  Administered 2015-09-08: 650 mg via ORAL
  Filled 2015-09-06: qty 2

## 2015-09-06 MED ORDER — ATORVASTATIN CALCIUM 10 MG PO TABS
20.0000 mg | ORAL_TABLET | Freq: Every day | ORAL | Status: DC
  Administered 2015-09-07 – 2015-09-09 (×3): 20 mg via ORAL
  Filled 2015-09-06 (×3): qty 2

## 2015-09-06 MED ORDER — ACETAMINOPHEN 650 MG RE SUPP: 650.0000 mg | Freq: Four times a day (QID) | RECTAL | Status: DC | PRN

## 2015-09-06 MED ORDER — DEXTROSE 5 % IV SOLN
1.0000 g | INTRAVENOUS | Status: DC
  Administered 2015-09-07 – 2015-09-09 (×3): 1 g via INTRAVENOUS
  Filled 2015-09-06 (×5): qty 10

## 2015-09-06 MED ORDER — DONEPEZIL HCL 10 MG PO TABS
5.0000 mg | ORAL_TABLET | Freq: Every day | ORAL | Status: DC
  Administered 2015-09-07 – 2015-09-10 (×4): 5 mg via ORAL
  Filled 2015-09-06 (×4): qty 1

## 2015-09-06 MED ORDER — ONDANSETRON HCL 4 MG/2ML IJ SOLN: 4.0000 mg | Freq: Four times a day (QID) | INTRAMUSCULAR | Status: DC | PRN

## 2015-09-06 MED ORDER — SODIUM CHLORIDE 0.9 % IV SOLN
INTRAVENOUS | Status: DC
  Administered 2015-09-07: via INTRAVENOUS

## 2015-09-06 MED ORDER — INSULIN GLARGINE 100 UNIT/ML ~~LOC~~ SOLN
15.0000 [IU] | Freq: Two times a day (BID) | SUBCUTANEOUS | Status: DC
  Filled 2015-09-06 (×2): qty 0.15

## 2015-09-06 MED ORDER — OCUVITE PO TABS
1.0000 | ORAL_TABLET | Freq: Every day | ORAL | Status: DC
  Administered 2015-09-07 – 2015-09-10 (×4): 1 via ORAL
  Filled 2015-09-06 (×5): qty 1

## 2015-09-06 MED ORDER — WARFARIN SODIUM 5 MG PO TABS
5.0000 mg | ORAL_TABLET | ORAL | Status: DC
  Administered 2015-09-07 – 2015-09-08 (×2): 5 mg via ORAL
  Filled 2015-09-06 (×2): qty 1

## 2015-09-06 MED ORDER — FUROSEMIDE 40 MG PO TABS
40.0000 mg | ORAL_TABLET | Freq: Every day | ORAL | Status: DC
Start: 2015-09-07 — End: 2015-09-07

## 2015-09-06 MED ORDER — ALUM & MAG HYDROXIDE-SIMETH 200-200-20 MG/5ML PO SUSP: 30.0000 mL | Freq: Four times a day (QID) | ORAL | Status: DC | PRN

## 2015-09-06 MED ORDER — INSULIN ASPART 100 UNIT/ML ~~LOC~~ SOLN
0.0000 [IU] | Freq: Three times a day (TID) | SUBCUTANEOUS | Status: DC
  Administered 2015-09-07: 2 [IU] via SUBCUTANEOUS
  Administered 2015-09-07: 3 [IU] via SUBCUTANEOUS
  Administered 2015-09-07 – 2015-09-08 (×2): 2 [IU] via SUBCUTANEOUS
  Administered 2015-09-08 – 2015-09-09 (×4): 3 [IU] via SUBCUTANEOUS
  Administered 2015-09-09: 5 [IU] via SUBCUTANEOUS
  Administered 2015-09-10: 7 [IU] via SUBCUTANEOUS
  Administered 2015-09-10: 9 [IU] via SUBCUTANEOUS

## 2015-09-06 MED ORDER — INSULIN ASPART 100 UNIT/ML ~~LOC~~ SOLN
0.0000 [IU] | Freq: Every day | SUBCUTANEOUS | Status: DC
  Administered 2015-09-07: 2 [IU] via SUBCUTANEOUS
  Administered 2015-09-08: 3 [IU] via SUBCUTANEOUS
  Administered 2015-09-09: 5 [IU] via SUBCUTANEOUS

## 2015-09-06 MED ORDER — HYDROMORPHONE HCL 1 MG/ML IJ SOLN
0.5000 mg | INTRAMUSCULAR | Status: DC | PRN
Start: 2015-09-06 — End: 2015-09-07
  Administered 2015-09-07 (×3): 1 mg via INTRAVENOUS
  Filled 2015-09-06 (×3): qty 1

## 2015-09-06 MED ORDER — DEXTROSE 5 % IV SOLN
1.0000 g | Freq: Once | INTRAVENOUS | Status: AC
  Administered 2015-09-06: 1 g via INTRAVENOUS
  Filled 2015-09-06: qty 10

## 2015-09-06 NOTE — ED Notes (Signed)
Brought in by EMS from home with c/o generalized body weakness.  Pt reports that she has been not able to move around for a week now---- believes that it's due to her increasing pain to lower back and right foot.  Has hx of Arthritis.  Presents to ED A/Ox4, no s/s apparent distress noted.

## 2015-09-06 NOTE — ED Provider Notes (Addendum)
CSN: [id number]     Arrival date & time 09/06/15  2004 History   First MD Initiated Contact with Patient 09/06/15 2011     Chief Complaint  Patient presents with  . Generalized Weakness      (Consider location/radiation/quality/duration/timing/severity/associated sxs/prior Treatment) HPI Comments: Pt is a 81y/o female with hx of DM, CHF, atrial fibrillation, CKD who present today with persistent severe pain in the right ankle and difficulty getting around.  States she twisted her ankle on the stairs about 2 weeks ago and has persistent pain in the ankle anytime she tries to walk.  She has been taking tylenol without relief.  She has a px for something stronger but can't get it filled till the 12th.  She has some neck and back pain this morning when waking up but it is gone now.  She denies SOB, chest pain, N/V/D.  No abd pain or urinary complaints.  She has not been eating as much as she should be not sure why her appetite is down.  No new leg swelling and no recent med changes.  Coumadin checked every Thursday and was 2.6  The history is provided by the patient.    Past Medical History  Diagnosis Date  . Diabetes mellitus   . Chronic diastolic CHF (congestive heart failure) (HCC)   . Pulmonary embolus (HCC) 03-23-11    a. 1978 and 2012.  . Arthritis     left leg  . UTI (lower urinary tract infection)   . Hypothyroidism   . Permanent atrial fibrillation (HCC)   . Glaucoma     a. with significantly decreased vision.  . Chronic atrial flutter (HCC)   . Bradycardia     a. 05/2015: HR 40s-50s, occ 30s at night - not clear how much symptoms were r/t this, patient declined PPM at that time.  . CKD (chronic kidney disease), stage III   . Mild dementia   . Moderate to severe pulmonary hypertension (HCC) By echo 05/2015  . RBBB    Past Surgical History  Procedure Laterality Date  . Kisney stone removal  1978    removed thru bladder  . Cystoscopy w/ ureteral stent placement  09/06/2011    Procedure: CYSTOSCOPY WITH RETROGRADE PYELOGRAM/URETERAL STENT PLACEMENT;  Surgeon: Valetta Fuller, MD;  Location: [facility];  Service: Urology;  Laterality: Right;  cysto double j stent placement  . Ureteroscopy  09/06/2011    Procedure: URETEROSCOPY;  Surgeon: Valetta Fuller, MD;  Location: [facility];  Service: Urology;  Laterality: Right;   Family History  Problem Relation Age of Onset  . Hypertension     Social History  Substance Use Topics  . Smoking status: Never Smoker   . Smokeless tobacco: Never Used  . Alcohol Use: No   OB History    No data available     Review of Systems  All other systems reviewed and are negative.     Allergies  Gemfibrozil; Iohexol; Ivp dye; Bactrim; and Sulfamethoxazole-trimethoprim  Home Medications   Prior to Admission medications   Medication Sig Start Date End Date Taking? Authorizing Provider  atorvastatin (LIPITOR) 20 MG tablet Take 1 tablet (20 mg total) by mouth daily at 6 PM. 08/11/15   Chrystie Nose, MD  beta carotene w/minerals (OCUVITE) tablet Take 1 tablet by mouth daily.    Historical Provider, MD  donepezil (ARICEPT) 5 MG tablet Take 5 mg by mouth every morning. Prefers to take in the morning    Historical  Provider, MD  furosemide (LASIX) 40 MG tablet Take 40 mg by mouth every morning.     Historical Provider, MD  glipiZIDE (GLUCOTROL) 10 MG tablet Take 10 mg by mouth 2 (two) times daily before a meal.    Historical Provider, MD  insulin glargine (LANTUS) 100 UNIT/ML injection Inject 0.15 mLs (15 Units total) into the skin 2 (two) times daily. 06/14/15   Joanna Puff, MD  levothyroxine (SYNTHROID, LEVOTHROID) 50 MCG tablet Take 50 mcg by mouth daily before breakfast.    Historical Provider, MD  meloxicam (MOBIC) 15 MG tablet Take 15 mg by mouth at bedtime.     Historical Provider, MD  metFORMIN (GLUCOPHAGE-XR) 500 MG 24 hr tablet Take 500 mg by mouth at bedtime.     Historical Provider, MD  warfarin (COUMADIN) 5 MG tablet Take  5-7.5 mg by mouth daily at 6 PM. Alternates 5 mg with 7.5 mg    Historical Provider, MD   BP 152/70 mmHg  Pulse 67  Temp(Src) 98.1 F (36.7 C) (Oral)  Resp 18  Ht  (1.676 m)  Wt 240 lb (108.863 kg)  BMI 38.76 kg/m2  SpO2 96% Physical Exam  Constitutional: She is oriented to person, place, and time. She appears well-developed and well-nourished. No distress.  HENT:  Head: Normocephalic and atraumatic.  Mouth/Throat: Oropharynx is clear and moist.  Eyes: Conjunctivae and EOM are normal. Pupils are equal, round, and reactive to light.  Neck: Normal range of motion. Neck supple.  Cardiovascular: Normal rate, regular rhythm, normal heart sounds and intact distal pulses.   No murmur heard. Pulmonary/Chest: Effort normal. No respiratory distress. She has no wheezes. She has rales in the right lower field and the left lower field.  Abdominal: Soft. She exhibits no distension. There is no tenderness. There is no rebound and no guarding.  Musculoskeletal: Normal range of motion. She exhibits no edema.       Right ankle: She exhibits swelling and ecchymosis. Tenderness. Medial malleolus tenderness found.       Cervical back: Normal.       Lumbar back: Normal.       Feet:  2+ DP pulse in bilateral feet.    Neurological: She is alert and oriented to person, place, and time.  Skin: Skin is warm and dry. No rash noted. No erythema.  Psychiatric: She has a normal mood and affect. Her behavior is normal.  Nursing note and vitals reviewed.   ED Course  Procedures (including critical care time) Labs Review Labs Reviewed  CBC WITH DIFFERENTIAL/PLATELET - Abnormal; Notable for the following:    MCH 25.7 (*)    Monocytes Absolute 1.2 (*)    All other components within normal limits  URINALYSIS, ROUTINE W REFLEX MICROSCOPIC (NOT AT [facility]) - Abnormal; Notable for the following:    APPearance TURBID (*)    Hgb urine dipstick SMALL (*)    Nitrite POSITIVE (*)    Leukocytes, UA LARGE (*)     All other components within normal limits  URINE MICROSCOPIC-ADD ON - Abnormal; Notable for the following:    Bacteria, UA MANY (*)    All other components within normal limits  I-STAT CHEM 8, ED - Abnormal; Notable for the following:    BUN 22 (*)    Creatinine, Ser 1.20 (*)    Glucose, Bld 169 (*)    Calcium, Ion 1.08 (*)    All other components within normal limits  URINE CULTURE    Imaging Review  Dg Ankle Complete Right  09/06/2015  CLINICAL DATA:  Generalized body weakness, fell 3 weeks ago. Ankle pain. EXAM: RIGHT ANKLE - COMPLETE 3+ VIEW COMPARISON:  RIGHT ankle radiograph Feb 02, 2013 FINDINGS: Linear lucency tip of the medial malleolus seen only on the AP view. Ankle mortise appears congruent and tibial fibular syndesmosis intact. Osteopenia without destructive bony lesions. Medial ankle soft tissue swelling without subcutaneous gas or radiopaque foreign bodies. Moderate plantar calcaneal spur. IMPRESSION: Equivocal findings for acute nondisplaced medial malleolus fracture, limited by osteopenia. No dislocation. Electronically Signed   By: Awilda Metro M.D.   On: 09/06/2015 20:59   I have personally reviewed and evaluated these images and lab results as part of my medical decision-making.   EKG Interpretation None      MDM   Final diagnoses:  UTI (lower urinary tract infection)  Fractured medial malleolus, right, closed, initial encounter    Patient is a 81 year old female with multiple medical problems presenting today with complaint of right ankle pain for the last 2 weeks worse with ambulation and having difficulty getting around. Patient denies any infectious symptoms such as fever, nausea, vomiting, abdominal pain, chest pain, shortness of breath. She's noted no symptoms concerning for recent Lee worsening CHF such as lower extremity edema, weight gain or dyspnea on exertion or orthopnea.  Patient does recall twisting her ankle when going up the stairs proximal  weight 2 weeks ago and has had persistent pain and bruising in the right ankle since. She is taking Tylenol without improvement. When she woke up this morning she had back and neck pain which has since resolved. She denies any sciatica type symptoms. She has had no falls. Exam consistent with pain, healing ecchymosis and swelling over the right medial malleolus. Mild rales in the bases of both lungs which are most lot likely chronic but otherwise a normal exam.  He shouldn't does have a strong smell of urine so will ensure no UTI causing generalized symptoms.  CBC, Chem-8, UA, ankle film pending.  10:44 PM Imaging consistent with a nondisplaced medial malleolus fracture which is most likely why she's been having ongoing right ankle pain. However patient also found to have a UTI today. Spoke with her daughter on the phone that said she was unable to stand or walk this evening was having generalized weakness which is most likely the result of the UTI. Speaking with her daughter she did not feel that she was safe to come home due to the general medicine inability to stand or transfer independently. Will admit for treatment of UTI and improvement in weakness before going home.  Patient should wear an Ace wrap and a hard soled shoe when ambulating for the fracture.    Gwyneth Sprout, MD 09/06/15 2245  Gwyneth Sprout, MD 09/06/15 2302

## 2015-09-06 NOTE — ED Notes (Signed)
Delay in lab draw,  Pt enroute to exray 

## 2015-09-06 NOTE — H&P (Addendum)
[facility] Admission History and Physical       Sara Dennis UJW:[id number] DOB: 1922/06/13 DOA: 09/06/2015  Referring physician: EDP PCP: Theodoro Kos, MD  Specialists:   Chief Complaint: Confusion  HPI: Sara Dennis is a 81 y.o. female with a history of Chronic Diastolic CHF, Atrial fibrillation on Coumadin Rx,  HTN, DM2, Stage III CKD, Hypothyroid and Mild Dementia who was brought to the ED due to increased confusion, and decreased appetite and weakness over the past 2 -3 days.   She was evaluated in the ED and found to have mild dehydration and a UTI and a Urine culture was sent and she was placed on IV Rocephin.  She suffered a fll 3 weeks ago where she twisted her right ankle and has had increased pain since then.   An X-ray was performed in the ED and revealed a nondisplaced malleolar fracture, and an Ace wrap was applied in the ED.  She was referred for admission.       Review of Systems:  Constitutional: No Weight Loss, No Weight Gain, Night Sweats, Fevers, Chills, Dizziness, Light Headedness, Fatigue, +Generalized Weakness HEENT: No Headaches, Difficulty Swallowing,Tooth/Dental Problems,Sore Throat,  No Sneezing, Rhinitis, Ear Ache, Nasal Congestion, or Post Nasal Drip,  Cardio-vascular:  No Chest pain, Orthopnea, PND, Edema in Lower Extremities, Anasarca, Dizziness, Palpitations  Resp: No Dyspnea, No DOE, No Productive Cough, No Non-Productive Cough, No Hemoptysis, No Wheezing.    GI: No Heartburn, Indigestion, Abdominal Pain, Nausea, Vomiting, Diarrhea, Constipation, Hematemesis, Hematochezia, Melena, Change in Bowel Habits,  +Loss of Appetite  GU: No Dysuria, No Change in Color of Urine, No Urgency or Urinary Frequency, No Flank pain.  Musculoskeletal: +Right Ankle Pain, No Decreased Range of Motion, No Back Pain.  Neurologic: No Syncope, No Seizures, Muscle Weakness, Paresthesia, Vision Disturbance or Loss, No Diplopia, No Vertigo, No Difficulty Walking,  Skin:  No Rash or Lesions. Psych: No Change in Mood or Affect, No Depression or Anxiety, No Memory loss, +Confusion, or Hallucinations   Past Medical History  Diagnosis Date  . Diabetes mellitus   . Chronic diastolic CHF (congestive heart failure) (HCC)   . Pulmonary embolus (HCC) 03-23-11    a. 1978 and 2012.  . Arthritis     left leg  . UTI (lower urinary tract infection)   . Hypothyroidism   . Permanent atrial fibrillation (HCC)   . Glaucoma     a. with significantly decreased vision.  . Chronic atrial flutter (HCC)   . Bradycardia     a. 05/2015: HR 40s-50s, occ 30s at night - not clear how much symptoms were r/t this, patient declined PPM at that time.  . CKD (chronic kidney disease), stage III   . Mild dementia   . Moderate to severe pulmonary hypertension (HCC) By echo 05/2015  . RBBB      Past Surgical History  Procedure Laterality Date  . Kisney stone removal  1978    removed thru bladder  . Cystoscopy w/ ureteral stent placement  09/06/2011    Procedure: CYSTOSCOPY WITH RETROGRADE PYELOGRAM/URETERAL STENT PLACEMENT;  Surgeon: Valetta Fuller, MD;  Location: [facility];  Service: Urology;  Laterality: Right;  cysto double j stent placement  . Ureteroscopy  09/06/2011    Procedure: URETEROSCOPY;  Surgeon: Valetta Fuller, MD;  Location: [facility];  Service: Urology;  Laterality: Right;      Prior to Admission medications   Medication Sig Start Date End Date Taking? Authorizing Provider  atorvastatin (  LIPITOR) 20 MG tablet Take 1 tablet (20 mg total) by mouth daily at 6 PM. 08/11/15   Chrystie Nose, MD  beta carotene w/minerals (OCUVITE) tablet Take 1 tablet by mouth daily.    Historical Provider, MD  donepezil (ARICEPT) 5 MG tablet Take 5 mg by mouth every morning. Prefers to take in the morning    Historical Provider, MD  furosemide (LASIX) 40 MG tablet Take 40 mg by mouth every morning.     Historical Provider, MD  glipiZIDE (GLUCOTROL) 10 MG tablet Take 10 mg by mouth 2 (two)  times daily before a meal.    Historical Provider, MD  insulin glargine (LANTUS) 100 UNIT/ML injection Inject 0.15 mLs (15 Units total) into the skin 2 (two) times daily. 06/14/15   Joanna Puff, MD  levothyroxine (SYNTHROID, LEVOTHROID) 50 MCG tablet Take 50 mcg by mouth daily before breakfast.    Historical Provider, MD  meloxicam (MOBIC) 15 MG tablet Take 15 mg by mouth at bedtime.     Historical Provider, MD  metFORMIN (GLUCOPHAGE-XR) 500 MG 24 hr tablet Take 500 mg by mouth at bedtime.     Historical Provider, MD  warfarin (COUMADIN) 5 MG tablet Take 5-7.5 mg by mouth daily at 6 PM. Alternates 5 mg with 7.5 mg    Historical Provider, MD     Allergies  Allergen Reactions  . Gemfibrozil     Sick and give urine infection  . Iohexol Itching  . Ivp Dye [Iodinated Diagnostic Agents]     itching  . Bactrim Itching, Nausea And Vomiting and Rash    And diarrhea  . Sulfamethoxazole-Trimethoprim Itching, Nausea And Vomiting and Rash    And diarrhea    Social History:  Lives with Her Daughter  reports that she has never smoked. She has never used smokeless tobacco. She reports that she does not drink alcohol or use illicit drugs.    Family History  Problem Relation Age of Onset  . Hypertension         Physical Exam:  GEN:  Pleasant Mildly Confused Obese Elderly 81 y.o. African American female examined and in no acute distress; cooperative with exam Filed Vitals:   09/06/15 2100 09/06/15 2130 09/06/15 2200 09/06/15 2247  BP: 148/70 135/68 119/76 132/60  Pulse:  68  68  Temp:      TempSrc:      Resp:    18  Height:      Weight:      SpO2:  91%  95%   Blood pressure 132/60, pulse 68, temperature 98.1 F (36.7 C), temperature source Oral, resp. rate 18, height 5\' 6"  (1.676 m), weight 108.863 kg (240 lb), SpO2 95 %. PSYCH: She is alert and oriented x4; does not appear anxious does not appear depressed; affect is normal HEENT: Normocephalic and Atraumatic, Mucous membranes pink;  PERRLA; EOM intact; Fundi:  Benign;  No scleral icterus, Nares: Patent, Oropharynx: Clear, Fair Dentition,    Neck:  FROM, No Cervical Lymphadenopathy nor Thyromegaly or Carotid Bruit; No JVD; Breasts:: Not examined CHEST WALL: No tenderness CHEST: Normal respiration, clear to auscultation bilaterally HEART: Irregular rate and rhythm; no murmurs rubs or gallops BACK: No kyphosis or scoliosis; No CVA tenderness ABDOMEN: Positive Bowel Sounds, Obese, Soft Non-Tender, No Rebound or Guarding; No Masses, No Organomegaly, No Pannus; No Intertriginous candida. Rectal Exam: Not done EXTREMITIES: No Cyanosis, Clubbing, or Edema; No Ulcerations. Genitalia: not examined PULSES: 2+ and symmetric SKIN: Normal hydration no rash or ulceration  CNS:  Alert and Oriented x 4, No Focal Deficits Except for Legal Blindness Vascular: pulses palpable throughout    Labs on Admission:  Basic Metabolic Panel:  Recent Labs Lab 09/06/15 2113  NA 141  K 4.0  CL 105  GLUCOSE 169*  BUN 22*  CREATININE 1.20*   Liver Function Tests: No results for input(s): AST, ALT, ALKPHOS, BILITOT, PROT, ALBUMIN in the last 168 hours. No results for input(s): LIPASE, AMYLASE in the last 168 hours. No results for input(s): AMMONIA in the last 168 hours. CBC:  Recent Labs Lab 09/06/15 2056 09/06/15 2113  WBC 10.5  --   NEUTROABS 7.6  --   HGB 12.1 13.9  HCT 37.1 41.0  MCV 78.9  --   PLT 269  --    Cardiac Enzymes: No results for input(s): CKTOTAL, CKMB, CKMBINDEX, TROPONINI in the last 168 hours.  BNP (last 3 results) No results for input(s): BNP in the last 8760 hours.  ProBNP (last 3 results) No results for input(s): PROBNP in the last 8760 hours.  CBG: No results for input(s): GLUCAP in the last 168 hours.  Radiological Exams on Admission: Dg Ankle Complete Right  09/06/2015  CLINICAL DATA:  Generalized body weakness, fell 3 weeks ago. Ankle pain. EXAM: RIGHT ANKLE - COMPLETE 3+ VIEW COMPARISON:   RIGHT ankle radiograph Feb 02, 2013 FINDINGS: Linear lucency tip of the medial malleolus seen only on the AP view. Ankle mortise appears congruent and tibial fibular syndesmosis intact. Osteopenia without destructive bony lesions. Medial ankle soft tissue swelling without subcutaneous gas or radiopaque foreign bodies. Moderate plantar calcaneal spur. IMPRESSION: Equivocal findings for acute nondisplaced medial malleolus fracture, limited by osteopenia. No dislocation. Electronically Signed   By: Awilda Metro M.D.   On: 09/06/2015 20:59      Assessment/Plan:   81 y.o. female with  Principal Problem:  Acute encephalopathy- due to UTI, most likely   Monitor   Treat UTI   Check TSH   Active Problems:   UTI (lower urinary tract infection)   Urine C+S sent   IV Rocephin    Acute on chronic renal failure (HCC)/ Chronic kidney disease stage III   Gentle IVFs   Monitor BUN/Cr     Diabetes mellitus (HCC)   Continue Lantus 15 units SQ BID   SSI coverage PRN   Check HbA1C in AM     Atrial flutter, unspecified   On Coumarin Rx    Chronic diastolic (congestive) heart failure (HCC)   On Lasix, Hold AM dose while hydrating   Monitor I/Os    Warfarin-induced coagulopathy (HCC)   On Coumadin    Monitor PT/INR   Pharmacy adjustement PRN     Hypothyroid   Continue Levothyroxine Rx    Check TSH Level in AM      Right Ankle Fracture- Nonsurgical   ACE wrap and RICE Rx      DVT Prophylaxis   On Coumadin Rx     Code Status:     FULL CODE       Family Communication:   No Family Present    Disposition Plan:    Inpatient Status        Time spent:  59 Minutes      Ron Parker [facility] Pager [phone]   If 7AM -7PM Please Contact the Day Rounding Team MD for [facility]  If 7PM-7AM, Please Contact Night-Floor Coverage  [email or web address] Password [email or web address] 09/06/2015, 11:21 PM     ADDENDUM:  Patient was seen and examined on 09/06/2015

## 2015-09-06 NOTE — ED Notes (Signed)
Bed: BL39 Expected date:  Expected time:  Means of arrival:  Comments: 61 f generalized weakness

## 2015-09-07 LAB — BASIC METABOLIC PANEL
ANION GAP: 8 (ref 5–15)
BUN: 22 mg/dL — AB (ref 6–20)
CHLORIDE: 105 mmol/L (ref 101–111)
CO2: 27 mmol/L (ref 22–32)
Calcium: 9.1 mg/dL (ref 8.9–10.3)
Creatinine, Ser: 1.36 mg/dL — ABNORMAL HIGH (ref 0.44–1.00)
GFR calc Af Amer: 38 mL/min — ABNORMAL LOW (ref >60)
GFR calc non Af Amer: 33 mL/min — ABNORMAL LOW (ref >60)
Glucose, Bld: 181 mg/dL — ABNORMAL HIGH (ref 65–99)
POTASSIUM: 3.9 mmol/L (ref 3.5–5.1)
SODIUM: 140 mmol/L (ref 135–145)

## 2015-09-07 LAB — CBC
HCT: 35.5 % — ABNORMAL LOW (ref 36.0–46.0)
Hemoglobin: 11.7 g/dL — ABNORMAL LOW (ref 12.0–15.0)
MCH: 26.1 pg (ref 26.0–34.0)
MCHC: 33 g/dL (ref 30.0–36.0)
MCV: 79.2 fL (ref 78.0–100.0)
PLATELETS: 319 10*3/uL (ref 150–400)
RBC: 4.48 MIL/uL (ref 3.87–5.11)
RDW: 14.7 % (ref 11.5–15.5)
WBC: 11.8 10*3/uL — AB (ref 4.0–10.5)

## 2015-09-07 LAB — GLUCOSE, CAPILLARY
GLUCOSE-CAPILLARY: 154 mg/dL — AB (ref 65–99)
Glucose-Capillary: 154 mg/dL — ABNORMAL HIGH (ref 65–99)
Glucose-Capillary: 240 mg/dL — ABNORMAL HIGH (ref 65–99)
Glucose-Capillary: 249 mg/dL — ABNORMAL HIGH (ref 65–99)

## 2015-09-07 LAB — PROTIME-INR
INR: 2.33 — ABNORMAL HIGH (ref 0.00–1.49)
PROTHROMBIN TIME: 25.3 s — AB (ref 11.6–15.2)

## 2015-09-07 MED ORDER — INSULIN GLARGINE 100 UNIT/ML ~~LOC~~ SOLN
10.0000 [IU] | Freq: Every day | SUBCUTANEOUS | Status: DC
  Administered 2015-09-07: 10 [IU] via SUBCUTANEOUS
  Filled 2015-09-07: qty 0.1

## 2015-09-07 MED ORDER — WARFARIN - PHYSICIAN DOSING INPATIENT: Freq: Every day | Status: DC

## 2015-09-07 MED ORDER — WARFARIN SODIUM 5 MG PO TABS
7.5000 mg | ORAL_TABLET | ORAL | Status: DC
  Administered 2015-09-09: 7.5 mg via ORAL
  Filled 2015-09-07 (×2): qty 1

## 2015-09-07 MED ORDER — HYDROMORPHONE HCL 1 MG/ML IJ SOLN: 0.5000 mg | INTRAMUSCULAR | Status: DC | PRN

## 2015-09-07 NOTE — Progress Notes (Signed)
PHARMACIST - PHYSICIAN COMMUNICATION DR:  Butler Denmark CONCERNING: Pharmacy Care Issues Regarding Warfarin Labs  RECOMMENDATION (Action Taken): A baseline and daily protime for three days has been ordered to meet the [facility] Patient safety goal and comply with the current [facility] Pharmacy & Therapeutics Committee policy.   The Pharmacy will defer all warfarin dose order changes and follow up of lab results to the prescriber unless an additional order to initiate a "pharmacy Coumadin consult" is placed.  DESCRIPTION:  While hospitalized, to be in compliance with The [employer] Commission National Patient Safety Goals, all patients on warfarin must have a baseline and/or current protime prior to the administration of warfarin. Pharmacy has received your order for warfarin without these required laboratory assessments.

## 2015-09-07 NOTE — Progress Notes (Signed)
Pt's Rt. Lower extremity has been elevated on pillows all day. Able to move toes,warm to touch. Appetite fair. Visitor at bedside.

## 2015-09-07 NOTE — Progress Notes (Signed)
[facility] Progress Note   TIFFINIE CAILLIER  NUU:[id number]  DOB: 06/27/1922  DOA: 09/06/2015 PCP: Coralie Common, MD  Brief narrative: Sara Dennis is a 81 y.o. female with chronic diastolic heart failure, atrial fibrillation on Coumadin, hypertension, diabetes mellitus type 2, chronic kidney disease stage III and mild dementia who is brought into the hospital by her daughter for 3 days of confusion and poor by mouth intake. The daughter also mentioned that the patient fell a few weeks ago and has since been complaining of pain in her right ankle. X-ray of the ankle reveals a malleolus fracture which is nondisplaced   Subjective: No complaints of pain, no vomiting shortness breath or diarrhea.  Assessment/Plan: Principal Problem:   Acute encephalopathy -Appears to be secondary to UTI-is improving  Active Problems: UTI/ leukocytosis -cont current antibiotics  Right non-displace malleolar fx - pain control- outpt ortho eval- PT eval    Chronic kidney disease stage III - stable    Diabetes mellitus 2 - cont sliding scale- cut back on Lantus as PO intake is poor and she has vomited up breakfast    Atrial flutter, unspecified -cont coumadin - rate controlled without rate controllingmeds    Chronic diastolic (congestive) heart failure  - compensated    Hypothyroidism - synthroid    Code Status:     Code Status Orders        Start     Ordered   09/06/15 2346  Full code   Continuous     09/06/15 2345     Family Communication:  daughter Disposition Plan: treat UTI DVT prophylaxis: Coumadin Consultants: Procedures:  Antibiotics: Anti-infectives    Start     Dose/Rate Route Frequency Ordered Stop   09/07/15 2200  cefTRIAXone (ROCEPHIN) 1 g in dextrose 5 % 50 mL IVPB     1 g 100 mL/hr over 30 Minutes Intravenous Every 24 hours 09/06/15 2326     09/06/15 2245  cefTRIAXone (ROCEPHIN) 1 g in dextrose 5 % 50 mL IVPB     1 g 100 mL/hr over 30 Minutes  Intravenous  Once 09/06/15 2243 09/06/15 2341      Objective: Filed Weights   09/06/15 2011  Weight: 108.863 kg (240 lb)    Intake/Output Summary (Last 24 hours) at 09/07/15 1744 Last data filed at 09/07/15 1700  Gross per 24 hour  Intake   2400 ml  Output      0 ml  Net   2400 ml     Vitals Filed Vitals:   09/06/15 2300 09/07/15 0500 09/07/15 1015 09/07/15 1305  BP: 127/83 130/62 133/67 158/48  Pulse: 70 75 77 65  Temp:  98.5 F (36.9 C) 98.3 F (36.8 C) 98.2 F (36.8 C)  TempSrc:  Oral Oral Oral  Resp:  Height:      Weight:      SpO2: 97% 93% 95% 92%    Exam:  General:  Pt is alert, not confused, not in acute distress  HEENT: No icterus, No thrush, oral mucosa moist  Cardiovascular: regular rate and rhythm, S1/S2 No murmur  Respiratory: clear to auscultation bilaterally   Abdomen: Soft, +Bowel sounds, non tender, non distended, no guarding  MSK: No LE edema, cyanosis or clubbing- left ankle in ACE wrap  Data Reviewed: Basic Metabolic Panel:  Recent Labs Lab 09/06/15 2113 09/07/15 0110  NA 141 140  K 4.0 3.9  CL 105 105  CO2  --  27  GLUCOSE  169* 181*  BUN 22* 22*  CREATININE 1.20* 1.36*  CALCIUM  --  9.1   Liver Function Tests: No results for input(s): AST, ALT, ALKPHOS, BILITOT, PROT, ALBUMIN in the last 168 hours. No results for input(s): LIPASE, AMYLASE in the last 168 hours. No results for input(s): AMMONIA in the last 168 hours. CBC:  Recent Labs Lab 09/06/15 2056 09/06/15 2113 09/07/15 0110  WBC 10.5  --  11.8*  NEUTROABS 7.6  --   --   HGB 12.1 13.9 11.7*  HCT 37.1 41.0 35.5*  MCV 78.9  --  79.2  PLT 269  --  319   Cardiac Enzymes: No results for input(s): CKTOTAL, CKMB, CKMBINDEX, TROPONINI in the last 168 hours. BNP (last 3 results) No results for input(s): BNP in the last 8760 hours.  ProBNP (last 3 results) No results for input(s): PROBNP in the last 8760 hours.  CBG:  Recent Labs Lab 09/07/15 0805  09/07/15 1140 09/07/15 1713  GLUCAP 154* 154* 240*    No results found for this or any previous visit (from the past 240 hour(s)).   Studies: Dg Ankle Complete Right  09/06/2015  CLINICAL DATA:  Generalized body weakness, fell 3 weeks ago. Ankle pain. EXAM: RIGHT ANKLE - COMPLETE 3+ VIEW COMPARISON:  RIGHT ankle radiograph Feb 02, 2013 FINDINGS: Linear lucency tip of the medial malleolus seen only on the AP view. Ankle mortise appears congruent and tibial fibular syndesmosis intact. Osteopenia without destructive bony lesions. Medial ankle soft tissue swelling without subcutaneous gas or radiopaque foreign bodies. Moderate plantar calcaneal spur. IMPRESSION: Equivocal findings for acute nondisplaced medial malleolus fracture, limited by osteopenia. No dislocation. Electronically Signed   By: Awilda Metro M.D.   On: 09/06/2015 20:59    Scheduled Meds:  Scheduled Meds: . atorvastatin  20 mg Oral q1800  . beta carotene w/minerals  1 tablet Oral Daily  . cefTRIAXone (ROCEPHIN)  IV  1 g Intravenous Q24H  . donepezil  5 mg Oral Daily  . insulin aspart  0-5 Units Subcutaneous QHS  . insulin aspart  0-9 Units Subcutaneous TID WC  . levothyroxine  50 mcg Oral QAC breakfast  . warfarin  5 mg Oral Once per day on Sun Mon Wed Fri Sat  . [START ON 09/09/2015] warfarin  7.5 mg Oral Once per day on Tue Thu  . Warfarin - Physician Dosing Inpatient   Does not apply q1800   Continuous Infusions:   Time spent on care of this patient: 35 min   Jaselle Pryer, MD 09/07/2015, 5:44 PM  LOS: 1 day   [facility] Office  [phone] Pager - Text Page per [email or web address] If 7PM-7AM, please contact night-coverage [email or web address]

## 2015-09-08 ENCOUNTER — Inpatient Hospital Stay (HOSPITAL_COMMUNITY): Payer: Medicare HMO

## 2015-09-08 DIAGNOSIS — J810 Acute pulmonary edema: Secondary | ICD-10-CM

## 2015-09-08 LAB — GLUCOSE, CAPILLARY
GLUCOSE-CAPILLARY: 243 mg/dL — AB (ref 65–99)
GLUCOSE-CAPILLARY: 259 mg/dL — AB (ref 65–99)
Glucose-Capillary: 209 mg/dL — ABNORMAL HIGH (ref 65–99)
Glucose-Capillary: 210 mg/dL — ABNORMAL HIGH (ref 65–99)
Glucose-Capillary: 193 mg/dL — ABNORMAL HIGH (ref 65–99)

## 2015-09-08 LAB — HEMOGLOBIN A1C
HEMOGLOBIN A1C: 8.4 % — AB (ref 4.8–5.6)
Mean Plasma Glucose: 194 mg/dL

## 2015-09-08 LAB — CBC
HEMATOCRIT: 35.2 % — AB (ref 36.0–46.0)
HEMOGLOBIN: 11.5 g/dL — AB (ref 12.0–15.0)
MCH: 25.6 pg — ABNORMAL LOW (ref 26.0–34.0)
MCHC: 32.7 g/dL (ref 30.0–36.0)
MCV: 78.2 fL (ref 78.0–100.0)
Platelets: 309 10*3/uL (ref 150–400)
RBC: 4.5 MIL/uL (ref 3.87–5.11)
RDW: 14.7 % (ref 11.5–15.5)
WBC: 10.1 10*3/uL (ref 4.0–10.5)

## 2015-09-08 LAB — BASIC METABOLIC PANEL
ANION GAP: 10 (ref 5–15)
BUN: 23 mg/dL — AB (ref 6–20)
CHLORIDE: 99 mmol/L — AB (ref 101–111)
CO2: 24 mmol/L (ref 22–32)
Calcium: 8.6 mg/dL — ABNORMAL LOW (ref 8.9–10.3)
Creatinine, Ser: 1.29 mg/dL — ABNORMAL HIGH (ref 0.44–1.00)
GFR calc Af Amer: 41 mL/min — ABNORMAL LOW (ref >60)
GFR calc non Af Amer: 35 mL/min — ABNORMAL LOW (ref >60)
GLUCOSE: 248 mg/dL — AB (ref 65–99)
POTASSIUM: 4.4 mmol/L (ref 3.5–5.1)
Sodium: 133 mmol/L — ABNORMAL LOW (ref 135–145)

## 2015-09-08 LAB — PROTIME-INR
INR: 2.16 — ABNORMAL HIGH (ref 0.00–1.49)
Prothrombin Time: 23.9 seconds — ABNORMAL HIGH (ref 11.6–15.2)

## 2015-09-08 MED ORDER — INSULIN GLARGINE 100 UNIT/ML ~~LOC~~ SOLN
20.0000 [IU] | Freq: Every day | SUBCUTANEOUS | Status: DC
  Filled 2015-09-08: qty 0.2

## 2015-09-08 MED ORDER — TRAMADOL-ACETAMINOPHEN 37.5-325 MG PO TABS
1.0000 | ORAL_TABLET | Freq: Four times a day (QID) | ORAL | Status: DC | PRN
  Administered 2015-09-09 (×3): 1 via ORAL
  Filled 2015-09-08 (×3): qty 1

## 2015-09-08 MED ORDER — FUROSEMIDE 10 MG/ML IJ SOLN: 40.0000 mg | INTRAMUSCULAR | Status: DC

## 2015-09-08 MED ORDER — INSULIN GLARGINE 100 UNIT/ML ~~LOC~~ SOLN: 20.0000 [IU] | Freq: Two times a day (BID) | SUBCUTANEOUS | Status: DC

## 2015-09-08 MED ORDER — FUROSEMIDE 10 MG/ML IJ SOLN
40.0000 mg | Freq: Two times a day (BID) | INTRAMUSCULAR | Status: AC
  Administered 2015-09-08 – 2015-09-09 (×3): 40 mg via INTRAVENOUS
  Filled 2015-09-08 (×3): qty 4

## 2015-09-08 MED ORDER — INSULIN GLARGINE 100 UNIT/ML ~~LOC~~ SOLN
20.0000 [IU] | Freq: Two times a day (BID) | SUBCUTANEOUS | Status: DC
  Administered 2015-09-08 – 2015-09-10 (×5): 20 [IU] via SUBCUTANEOUS
  Filled 2015-09-08 (×7): qty 0.2

## 2015-09-08 NOTE — Evaluation (Signed)
Physical Therapy Evaluation Patient Details Name: Sara Dennis MRN: [id number] DOB: [birth year] Today's Date: 09/08/2015   History of Present Illness  Sara Dennis is a 81 y.o. female with a history of Chronic Diastolic CHF, Atrial fibrillation on Coumadin Rx, HTN, DM2, Stage III CKD, Hypothyroid and Mild Dementia who was brought to the ED 09/15/15 due to increased confusion, and decreased appetite and weakness over the past 2 -3 days. She was found to have mild dehydration and a UTI  She suffered a fall 3 weeks ago where she twisted her right ankle and has had increased pain since then. An X-ray was performed in the ED and revealed a nondisplaced Right medial malleolar fracture, and an Ace wrap was applied i  Clinical Impression  Pt admitted with above diagnosis. Pt currently with functional limitations due to the deficits listed below (see PT Problem List).  Pt will benefit from skilled PT to increase their independence and safety with mobility to allow discharge to the venue listed below.    Patient requires extensive assistance for bed mobility.  Did not attempt transfer due to  Unknown WBS on the right foot and now has order for CT of the right foot. Patient may require a sliding board to safely transfer as appears to have considerable pain of the R foot.    Follow Up Recommendations SNF;Supervision/Assistance - 24 hour    Equipment Recommendations  None recommended by PT    Recommendations for Other Services       Precautions / Restrictions Precautions Precautions: Fall Precaution Comments: incontinent of large volumes Restrictions Other Position/Activity Restrictions: No indication for WBS on the R  foot with nondisplaced medial malleolar ankle fracture. Note states" ACE wrap and a hard bottom shoe for ambulation. To F?U with ortho as outpatient.      Mobility  Bed Mobility Overal bed mobility: Needs Assistance;+2 for physical assistance;+ 2 for safety/equipment Bed  Mobility: Rolling;Supine to Sit;Sit to Supine Rolling: Max assist;+2 for safety/equipment   Supine to sit: Max assist;+2 for safety/equipment;+2 for physical assistance Sit to supine: Max assist;+2 for safety/equipment;+2 for physical assistance   General bed mobility comments: much effort to get to roll. CNA reports that patient was more helpful yesterday.  Transfers                 General transfer comment: NT  Ambulation/Gait                Stairs            Wheelchair Mobility    Modified Rankin (Stroke Patients Only)       Balance Overall balance assessment: History of Falls;Needs assistance Sitting-balance support: Bilateral upper extremity supported;Feet supported Sitting balance-Leahy Scale: Fair                                       Pertinent Vitals/Pain Pain Assessment: Faces Faces Pain Scale: Hurts even more Pain Location: when moving  R leg, Pain Descriptors / Indicators: Grimacing;Guarding Pain Intervention(s): Limited activity within patient's tolerance;Monitored during session;Repositioned    Home Living Family/patient expects to be discharged to:: Private residence Living Arrangements: Children Available Help at Discharge: Available 24 hours/day Type of Home: Mobile home Home Access: Stairs to enter Entrance Stairs-Rails: Right Entrance Stairs-Number of Steps: 4 Home Layout: One level Home Equipment: Cane - single point;Wheelchair - Economist - 2 wheels Additional Comments: info based  on EPIC doc from 3 months ago.    Prior Function Level of Independence: Needs assistance         Comments: no family available to provide     Hand Dominance        Extremity/Trunk Assessment   Upper Extremity Assessment: Generalized weakness           Lower Extremity Assessment: Generalized weakness;RLE deficits/detail RLE Deficits / Details: unable to move the ankle due to pain. in ACE wrap     Cervical / Trunk Assessment: Normal  Communication      Cognition Arousal/Alertness: Awake/alert Behavior During Therapy: Anxious;Restless Overall Cognitive Status: No family/caregiver present to determine baseline cognitive functioning Area of Impairment: Orientation;Following commands;Problem solving               General Comments: oriented to [facility] and fall.    General Comments      Exercises        Assessment/Plan    PT Assessment Patient needs continued PT services  PT Diagnosis Difficulty walking;Generalized weakness;Acute pain;Altered mental status   PT Problem List Decreased strength;Decreased activity tolerance;Decreased balance;Decreased mobility;Decreased cognition;Decreased knowledge of precautions;Decreased safety awareness;Decreased knowledge of use of DME;Pain  PT Treatment Interventions DME instruction;Functional mobility training;Therapeutic activities;Therapeutic exercise;Wheelchair mobility training;Patient/family education   PT Goals (Current goals can be found in the Care Plan section) Acute Rehab PT Goals Patient Stated Goal: patient unable  PT Goal Formulation: Patient unable to participate in goal setting Time For Goal Achievement: 09/22/15 Potential to Achieve Goals: Fair    Frequency Min 3X/week   Barriers to discharge Decreased caregiver support      Co-evaluation               End of Session   Activity Tolerance: Patient limited by pain Patient left: in bed;with call bell/phone within reach;with bed alarm set Nurse Communication: Mobility status;Need for lift equipment         Time: 7505-1833 PT Time Calculation (min) (ACUTE ONLY): 39 min   Charges:   PT Evaluation $Initial PT Evaluation Tier I: 1 Procedure PT Treatments $Therapeutic Activity: 23-37 mins   PT G Codes:        Rada Hay 09/08/2015, 11:26 AM Blanchard Kelch PT [phone]

## 2015-09-08 NOTE — Clinical Social Work Placement (Signed)
   CLINICAL SOCIAL WORK PLACEMENT  NOTE  Date:  09/08/2015  Patient Details  Name: Sara Dennis MRN: [id number] Date of Birth: [birth year]  Clinical Social Work is seeking post-discharge placement for this patient at the Skilled  Nursing Facility level of care (*CSW will initial, date and re-position this form in  chart as items are completed):  Yes   Patient/family provided with [facility] Department's list of facilities offering this level of care within the geographic area requested by the patient (or if unable, by the patient's family).  Yes   Patient/family informed of their freedom to choose among providers that offer the needed level of care, that participate in Medicare, Medicaid or managed care program needed by the patient, have an available bed and are willing to accept the patient.  Yes   Patient/family informed of [facility]'s ownership interest in [facility] and [facility], as well as of the fact that they are under no obligation to receive care at these facilities.  PASRR submitted to EDS on       PASRR number received on       Existing PASRR number confirmed on 09/08/15     FL2 transmitted to all facilities in geographic area requested by pt/family on 09/08/15     FL2 transmitted to all facilities within larger geographic area on       Patient informed that his/her managed care company has contracts with or will negotiate with certain facilities, including the following:            Patient/family informed of bed offers received.  Patient chooses bed at       Physician recommends and patient chooses bed at      Patient to be transferred to   on  .  Patient to be transferred to facility by       Patient family notified on   of transfer.  Name of family member notified:        PHYSICIAN Please sign FL2     Additional Comment:    _______________________________________________ Orson Eva, LCSW 09/08/2015, 2:47  PM

## 2015-09-08 NOTE — Progress Notes (Addendum)
[facility] Progress Note   Sara Dennis  ZOX:[id number]  DOB: Feb 28, 1922  DOA: 09/06/2015 PCP: Coralie Common, MD  Brief narrative: Sara Dennis is a 81 y.o. female with chronic diastolic heart failure, atrial fibrillation on Coumadin, hypertension, diabetes mellitus type 2, chronic kidney disease stage III and mild dementia who is brought into the hospital by her daughter for 3 days of confusion and poor by mouth intake. The daughter also mentioned that the patient fell a few weeks ago and has since been complaining of pain in her right ankle. X-ray of the ankle reveals a malleolus fracture which is nondisplaced   Subjective: Quite confused today. No complaints of pain, nausea, vomiting.   Assessment/Plan: Principal Problem:   Acute encephalopathy -initially appeared to be secondary to UTI- was improving but now confused again, likely due to narcotics ordered on admission- only takes Ultram at home- will transition to Ultracet and D/C Percocet and Dilaudid.   Active Problems: UTI/ leukocytosis -cont current antibiotics- culture pending  Hyponatremia - dehydrated therefore held Lasix but CXR now showing possible edema- will resume Lasix- follow sodium  Acute on chronic diastolic (congestive) heart failure  - see above  Right non-displace malleolar fx - pain control- ortho eval requested- recommended to obtain Ct of the ankle due to concerns for a "lucency  on xray - Pt eval done- SNF recommended    Chronic kidney disease stage III - stable    Diabetes mellitus 2 - cont sliding scale- cut back on Lantus yesterday as PO intake was poor and she has vomited up breakfast - sugars climbing- will increase Lantus to 20 BID today- follow    Atrial flutter, unspecified -cont coumadin - rate controlled without rate controlling meds    Hypothyroidism - synthroid    Code Status:     Code Status Orders        Start     Ordered   09/06/15 2346  Full code    Continuous     09/06/15 2345     Family Communication: spoke with daughter on 12/11 Disposition Plan: treat UTI DVT prophylaxis: Coumadin Consultants: Procedures:  Antibiotics: Anti-infectives    Start     Dose/Rate Route Frequency Ordered Stop   09/07/15 2200  cefTRIAXone (ROCEPHIN) 1 g in dextrose 5 % 50 mL IVPB     1 g 100 mL/hr over 30 Minutes Intravenous Every 24 hours 09/06/15 2326     09/06/15 2245  cefTRIAXone (ROCEPHIN) 1 g in dextrose 5 % 50 mL IVPB     1 g 100 mL/hr over 30 Minutes Intravenous  Once 09/06/15 2243 09/06/15 2341      Objective: Filed Weights   09/06/15 2011  Weight: 108.863 kg (240 lb)    Intake/Output Summary (Last 24 hours) at 09/08/15 1156 Last data filed at 09/08/15 0544  Gross per 24 hour  Intake   1630 ml  Output      0 ml  Net   1630 ml     Vitals Filed Vitals:   09/07/15 1015 09/07/15 1305 09/07/15 2140 09/08/15 0544  BP: 133/67 158/48 158/62 163/78  Pulse: 77 65 71 79  Temp: 98.3 F (36.8 C) 98.2 F (36.8 C) 99.2 F (37.3 C) 98.1 F (36.7 C)  TempSrc: Oral Oral Oral Oral  Resp: Height:      Weight:      SpO2: 95% 92% 93% 91%    Exam:  General:  Pt is alert, not  confused, not in acute distress  HEENT: No icterus, No thrush, oral mucosa moist  Cardiovascular: regular rate and rhythm, S1/S2 No murmur  Respiratory: clear to auscultation bilaterally - poor air entry - distant breath sounds  Abdomen: Soft, +Bowel sounds, non tender, non distended, no guarding  MSK: No LE edema, cyanosis or clubbing- left ankle in ACE wrap  Data Reviewed: Basic Metabolic Panel:  Recent Labs Lab 09/06/15 2113 09/07/15 0110 09/08/15 0400  NA 141 140 133*  K 4.0 3.9 4.4  CL 105 105 99*  CO2  --  27 24  GLUCOSE 169* 181* 248*  BUN 22* 22* 23*  CREATININE 1.20* 1.36* 1.29*  CALCIUM  --  9.1 8.6*   Liver Function Tests: No results for input(s): AST, ALT, ALKPHOS, BILITOT, PROT, ALBUMIN in the last 168 hours. No  results for input(s): LIPASE, AMYLASE in the last 168 hours. No results for input(s): AMMONIA in the last 168 hours. CBC:  Recent Labs Lab 09/06/15 2056 09/06/15 2113 09/07/15 0110 09/08/15 0400  WBC 10.5  --  11.8* 10.1  NEUTROABS 7.6  --   --   --   HGB 12.1 13.9 11.7* 11.5*  HCT 37.1 41.0 35.5* 35.2*  MCV 78.9  --  79.2 78.2  PLT 269  --  319 309   Cardiac Enzymes: No results for input(s): CKTOTAL, CKMB, CKMBINDEX, TROPONINI in the last 168 hours. BNP (last 3 results) No results for input(s): BNP in the last 8760 hours.  ProBNP (last 3 results) No results for input(s): PROBNP in the last 8760 hours.  CBG:  Recent Labs Lab 09/07/15 0805 09/07/15 1140 09/07/15 1713 09/07/15 2135 09/08/15 0737  GLUCAP 154* 154* 240* 249* 209*    No results found for this or any previous visit (from the past 240 hour(s)).   Studies: Dg Ankle Complete Right  09/06/2015  CLINICAL DATA:  Generalized body weakness, fell 3 weeks ago. Ankle pain. EXAM: RIGHT ANKLE - COMPLETE 3+ VIEW COMPARISON:  RIGHT ankle radiograph Feb 02, 2013 FINDINGS: Linear lucency tip of the medial malleolus seen only on the AP view. Ankle mortise appears congruent and tibial fibular syndesmosis intact. Osteopenia without destructive bony lesions. Medial ankle soft tissue swelling without subcutaneous gas or radiopaque foreign bodies. Moderate plantar calcaneal spur. IMPRESSION: Equivocal findings for acute nondisplaced medial malleolus fracture, limited by osteopenia. No dislocation. Electronically Signed   By: Awilda Metro M.D.   On: 09/06/2015 20:59    Scheduled Meds:  Scheduled Meds: . atorvastatin  20 mg Oral q1800  . beta carotene w/minerals  1 tablet Oral Daily  . cefTRIAXone (ROCEPHIN)  IV  1 g Intravenous Q24H  . donepezil  5 mg Oral Daily  . insulin aspart  0-5 Units Subcutaneous QHS  . insulin aspart  0-9 Units Subcutaneous TID WC  . insulin glargine  20 Units Subcutaneous BID  . levothyroxine   50 mcg Oral QAC breakfast  . warfarin  5 mg Oral Once per day on Sun Mon Wed Fri Sat  . [START ON 09/09/2015] warfarin  7.5 mg Oral Once per day on Tue Thu  . Warfarin - Physician Dosing Inpatient   Does not apply q1800   Continuous Infusions:   Time spent on care of this patient: 35 min   Dawsyn Zurn, MD 09/08/2015, 11:56 AM  LOS: 2 days   [facility] Office  [phone] Pager - Text Page per [email or web address] If 7PM-7AM, please contact night-coverage [email or web address]

## 2015-09-08 NOTE — Clinical Social Work Note (Signed)
Clinical Social Work Assessment  Patient Details  Name: Sara Dennis MRN: [id number] Date of Birth: 12/03/1921  Date of referral:  09/08/15               Reason for consult:  Discharge Planning                Permission sought to share information with:  Family Supports Permission granted to share information::  Yes, Verbal Permission Granted  Name::     Sara Dennis  Agency::     Relationship::  daughter  Contact Information:  [phone]  Housing/Transportation Living arrangements for the past 2 months:  Single Family Home Source of Information:  Patient Patient Interpreter Needed:  None Criminal Activity/Legal Involvement Pertinent to Current Situation/Hospitalization:  No - Comment as needed Significant Relationships:  Adult Children Lives with:  Adult Children Do you feel safe going back to the place where you live?  No Need for family participation in patient care:  Yes (Comment)  Care giving concerns:  Pt admitted from home with pt daughter. PT eval completed and recommendation for SNF upon discharge.   Social Worker assessment / plan:  CSW received referral for New SNF.   CSW visited pt room. Pt alert and oriented x 3, but has periods of confusion at this time secondary to UTI and narcotics. Pt was on the phone with pt daughter when CSW visited. Pt asked CSW speak with pt daughter. CSW spoke with pt daughter via telephone. Pt daughter reports that pt lives with pt daughter. Pt daughter states that pt was able to do ADL's with minimal assistance prior to admission. CSW discussed with pt daughter that PT evaluated pt and recommended SNF upon discharge CSW explained that pt requiring max assist with bed mobility. Pt daughter stated that she agreed that pt needed SNF upon discharge. Pt daughter states that she wants to ensure that pt is aware that plan is for short term rehab. Pt daughter discussed that pt has been to [facility] in the past and pt daughter was pleased with pt  stay at [facility] and would be agreeable to [facility] for pt placement upon discharge if facility accept pt insurance and has bed available. Pt daughter requested information on resources that assist in building ramps in peoples homes. CSW discussed that CSW will look into information to provide pt daughter to follow up with. CSW discussed with pt at bedside regarding that pt will need short term rehab at SNF prior to returning home. Pt expressed understanding and agrees that she is weak. CSW discussed with pt that pt daughter was interested in pt returning to [facility] and pt states that she is agreeable to [facility] if they can accept.   CSW completed FL2 and initiated SNF search to [facility]. CSW contacted [facility] to notify facility of pt interest. [facility] Ad[facility] to review information and notify CSW if facility can accept pt. Pt has [employer] Medicare which requires authorization prior to pt discharge from hospital.  CSW to follow up with pt and pt daughter re: SNF bed offers.  CSW to continue to follow to provide support and assist with pt disposition needs.  Employment status:  Retired Database administrator, [employer] [place] PT Recommendations:  Skilled Nursing Facility Information / Referral to community resources:  Skilled Nursing Facility  Patient/Family's Response to care:  Pt alert and oriented x 3 with periods of confusion. Pt daughter reports that pt mental status is not  at baseline and pt is usually sharp mentally. Pt daughter agreeable to plan for rehab at [facility] upon discharge.  Patient/Family's Understanding of and Emotional Response to Diagnosis, Current Treatment, and Prognosis:  Pt recognizes that current medical diagnosis is causing pt increased weakness. Pt daughter displayed understanding surrounding pt diagnosis and treatment plan and hopeful that pt mental status will begin to clear.   Emotional Assessment Appearance:  Appears stated  age Attitude/Demeanor/Rapport:  Other (pt appropriate) Affect (typically observed):  Appropriate, Accepting Orientation:  Oriented to Self, Oriented to Place, Oriented to Situation Alcohol / Substance use:  Not Applicable Psych involvement (Current and /or in the community):  No (Comment)  Discharge Needs  Concerns to be addressed:  Discharge Planning Concerns Readmission within the last 30 days:  No Current discharge risk:  Physical Impairment Barriers to Discharge:  Continued Medical Work up   Loletta Specter A, LCSW 09/08/2015, 2:17 PM  [phone]

## 2015-09-08 NOTE — Progress Notes (Signed)
Pt has been more confused today than yesterday. Appetite is poor also.Temp aux-100.7. Dr. Butler Denmark called. Blood cultures drawn.Temp 98.3 @ 1700 orally..Pt continually incontinent. Turned and repositioned Q2 hr. Rt. Foot elevated on pillow.

## 2015-09-08 NOTE — [place] FL2 (Signed)
[place] MEDICAID FL2 LEVEL OF CARE SCREENING TOOL     IDENTIFICATION  Patient Name: Sara Dennis Birthdate: January 10, 1922 Sex: female Admission Date (Current Location): 09/06/2015  [place] and [place] Number:   [id number] Facility and Address:  [facility],  [address] [place] [address]      Provider Number: [id number]  Attending Physician Name and Address:  Calvert Cantor, MD  Relative Name and Phone Number:       Current Level of Care: SNF Recommended Level of Care: Skilled Nursing Facility Prior Approval Number:    Date Approved/Denied:   PASRR Number: [id number]  Discharge Plan: SNF    Current Diagnoses: Patient Active Problem List   Diagnosis Date Noted  . UTI (lower urinary tract infection) 09/06/2015  . Chronic diastolic (congestive) heart failure (HCC) 09/06/2015  . Diabetes mellitus 09/06/2015  . Acute on chronic renal failure (HCC) 09/06/2015  . Warfarin-induced coagulopathy (HCC) 09/06/2015  . Hypothyroidism 09/06/2015  . Fractured medial malleolus   . Demand ischemia (HCC) 06/12/2015  . Pulmonary edema with congestive heart failure (HCC)   . Atrial flutter, unspecified   . Bradycardia 06/11/2015  . Anginal pain (HCC) 06/11/2015  . History of pulmonary embolism 06/11/2015  . Atrial flutter (HCC) 05/06/2013  . Diabetes mellitus (HCC) 05/05/2013  . Hypotension 05/04/2013  . UTI (urinary tract infection) 01/30/2013  . CAP (community acquired pneumonia) 01/30/2013  . Leukocytosis, unspecified 01/30/2013  . Acute encephalopathy 01/30/2013  . CHF (congestive heart failure) (HCC) 01/30/2013  . Chronic kidney disease stage III 01/30/2013  . Ureteral calculus 09/06/2011    Orientation RESPIRATION BLADDER Height & Weight    Self, Situation, Place  Normal Incontinent  (167.6 cm) 240 lbs.  BEHAVIORAL SYMPTOMS/MOOD NEUROLOGICAL BOWEL NUTRITION STATUS     (NONE) Continent Diet (Diet Heart Healthy/Carb Modified)  AMBULATORY STATUS  COMMUNICATION OF NEEDS Skin   Extensive Assist Verbally Normal                       Personal Care Assistance Level of Assistance  Bathing, Feeding, Dressing Bathing Assistance: Maximum assistance Feeding assistance: Limited assistance Dressing Assistance: Maximum assistance     Functional Limitations Info  Sight, Hearing, Speech Sight Info: Impaired Hearing Info: Adequate Speech Info: Adequate    SPECIAL CARE FACTORS FREQUENCY  PT (By licensed PT), OT (By licensed OT)     PT Frequency: 5 x a week OT Frequency: 5 x a week            Contractures Contractures Info: Not present    Additional Factors Info  Code Status, Allergies, Insulin Sliding Scale Code Status Info: FULL code status Allergies Info: Gemfibrozil, Iohexol, Ivp Dye, Bactrim, Sulfamethoxazole-trimethoprim   Insulin Sliding Scale Info: 4x a day       Current Medications (09/08/2015):  This is the current hospital active medication list Current Facility-Administered Medications  Medication Dose Route Frequency Provider Last Rate Last Dose  . acetaminophen (TYLENOL) tablet 650 mg  650 mg Oral Q6H PRN Ron Parker, MD       Or  . acetaminophen (TYLENOL) suppository 650 mg  650 mg Rectal Q6H PRN Ron Parker, MD      . alum & mag hydroxide-simeth (MAALOX/MYLANTA) 200-200-20 MG/5ML suspension 30 mL  30 mL Oral Q6H PRN Ron Parker, MD      . atorvastatin (LIPITOR) tablet 20 mg  20 mg Oral q1800 Ron Parker, MD   20 mg at 09/07/15 1743  .  beta carotene w/minerals (OCUVITE) tablet 1 tablet  1 tablet Oral Daily Ron Parker, MD   1 tablet at 09/08/15 1042  . cefTRIAXone (ROCEPHIN) 1 g in dextrose 5 % 50 mL IVPB  1 g Intravenous Q24H Ron Parker, MD   1 g at 09/07/15 2128  . donepezil (ARICEPT) tablet 5 mg  5 mg Oral Daily Ron Parker, MD   5 mg at 09/08/15 1042  . furosemide (LASIX) injection 40 mg  40 mg Intravenous BID Calvert Cantor, MD      . insulin aspart  (novoLOG) injection 0-5 Units  0-5 Units Subcutaneous QHS Ron Parker, MD   2 Units at 09/07/15 2138  . insulin aspart (novoLOG) injection 0-9 Units  0-9 Units Subcutaneous TID WC Ron Parker, MD   3 Units at 09/08/15 1405  . insulin glargine (LANTUS) injection 20 Units  20 Units Subcutaneous BID Calvert Cantor, MD   20 Units at 09/08/15 1041  . levothyroxine (SYNTHROID, LEVOTHROID) tablet 50 mcg  50 mcg Oral QAC breakfast Ron Parker, MD   50 mcg at 09/08/15 0905  . ondansetron (ZOFRAN) tablet 4 mg  4 mg Oral Q6H PRN Ron Parker, MD       Or  . ondansetron (ZOFRAN) injection 4 mg  4 mg Intravenous Q6H PRN Ron Parker, MD      . traMADol-acetaminophen (ULTRACET) 37.5-325 MG per tablet 1 tablet  1 tablet Oral Q6H PRN Calvert Cantor, MD      . warfarin (COUMADIN) tablet 5 mg  5 mg Oral Once per day on Sun Mon Wed Fri Sat Ron Parker, MD   5 mg at 09/07/15 1744  . [START ON 09/09/2015] warfarin (COUMADIN) tablet 7.5 mg  7.5 mg Oral Once per day on Tue Thu Calvert Cantor, MD      . Warfarin - Physician Dosing Inpatient   Does not apply [id number] Calvert Cantor, MD         Discharge Medications: Please see discharge summary for a list of discharge medications.  Relevant Imaging Results:  Relevant Lab Results:   Additional Information SSN: [id number]  Damain Broadus, Selena Lesser A, LCSW

## 2015-09-09 DIAGNOSIS — N390 Urinary tract infection, site not specified: Secondary | ICD-10-CM

## 2015-09-09 DIAGNOSIS — G9340 Encephalopathy, unspecified: Secondary | ICD-10-CM

## 2015-09-09 DIAGNOSIS — N183 Chronic kidney disease, stage 3 (moderate): Secondary | ICD-10-CM

## 2015-09-09 DIAGNOSIS — M25571 Pain in right ankle and joints of right foot: Secondary | ICD-10-CM

## 2015-09-09 DIAGNOSIS — I5032 Chronic diastolic (congestive) heart failure: Secondary | ICD-10-CM

## 2015-09-09 LAB — BASIC METABOLIC PANEL
Anion gap: 9 (ref 5–15)
BUN: 16 mg/dL (ref 6–20)
CO2: 27 mmol/L (ref 22–32)
CREATININE: 1 mg/dL (ref 0.44–1.00)
Calcium: 9.3 mg/dL (ref 8.9–10.3)
Chloride: 100 mmol/L — ABNORMAL LOW (ref 101–111)
GFR calc Af Amer: 55 mL/min — ABNORMAL LOW (ref >60)
GFR, EST NON AFRICAN AMERICAN: 48 mL/min — AB (ref >60)
GLUCOSE: 198 mg/dL — AB (ref 65–99)
Potassium: 3.8 mmol/L (ref 3.5–5.1)
SODIUM: 136 mmol/L (ref 135–145)

## 2015-09-09 LAB — GLUCOSE, CAPILLARY
GLUCOSE-CAPILLARY: 235 mg/dL — AB (ref 65–99)
GLUCOSE-CAPILLARY: 368 mg/dL — AB (ref 65–99)
Glucose-Capillary: 209 mg/dL — ABNORMAL HIGH (ref 65–99)
Glucose-Capillary: 288 mg/dL — ABNORMAL HIGH (ref 65–99)

## 2015-09-09 LAB — PROTIME-INR
INR: 1.8 — AB (ref 0.00–1.49)
PROTHROMBIN TIME: 20.9 s — AB (ref 11.6–15.2)

## 2015-09-09 LAB — URIC ACID: Uric Acid, Serum: 7.3 mg/dL — ABNORMAL HIGH (ref 2.3–6.6)

## 2015-09-09 MED ORDER — HYDROCODONE-ACETAMINOPHEN 5-325 MG PO TABS
1.0000 | ORAL_TABLET | Freq: Once | ORAL | Status: AC
  Administered 2015-09-09: 1 via ORAL
  Filled 2015-09-09: qty 1

## 2015-09-09 MED ORDER — PREDNISONE 20 MG PO TABS
60.0000 mg | ORAL_TABLET | Freq: Every day | ORAL | Status: DC
  Administered 2015-09-09 – 2015-09-10 (×2): 60 mg via ORAL
  Filled 2015-09-09 (×2): qty 3

## 2015-09-09 NOTE — Progress Notes (Signed)
[facility] Progress Note   Sara Dennis  JGO:[id number]  DOB: 08-12-1922  DOA: 09/06/2015 PCP: Coralie Common, MD  Brief narrative: Sara Dennis is a 81 y.o. female with chronic diastolic heart failure, atrial fibrillation on Coumadin, hypertension, diabetes mellitus type 2, chronic kidney disease stage III and mild dementia who is brought into the hospital by her daughter for 3 days of confusion and poor by mouth intake. The daughter also mentioned that the patient fell a few weeks ago and has since been complaining of pain in her right ankle.    Subjective: Still with pain in ankle  Assessment/Plan:   Acute encephalopathy -initially appeared to be secondary to UTI- was improving but now confused again, likely due to narcotics ordered on admission- only takes Ultram at home- will transition to Ultracet and D/C Percocet and Dilaudid.    UTI/ leukocytosis -cont current antibiotics- culture pending  Hyponatremia - dehydrated therefore held Lasix but CXR now showing possible edema- will resume Lasix- follow sodium  Acute on chronic diastolic (congestive) heart failure  - see above  Right non-displace malleolar fx- NO FRACTURE SEEN ON CT -- Pt eval done- SNF recommended -suspect gout-- will get uric acid and give 1 dose of PO prednisone    Chronic kidney disease stage III - stable    Diabetes mellitus 2 - cont sliding scale -watch blood sugars    Atrial flutter, unspecified -cont coumadin - rate controlled without rate controlling meds    Hypothyroidism - synthroid    Code Status:     Code Status Orders        Start     Ordered   09/06/15 2346  Full code   Continuous     09/06/15 2345     Family Communication: no family at bedside Disposition Plan: treat UTI DVT prophylaxis: Coumadin Consultants: Procedures:  Antibiotics: Anti-infectives    Start     Dose/Rate Route Frequency Ordered Stop   09/07/15 2200  cefTRIAXone (ROCEPHIN) 1 g in dextrose  5 % 50 mL IVPB     1 g 100 mL/hr over 30 Minutes Intravenous Every 24 hours 09/06/15 2326     09/06/15 2245  cefTRIAXone (ROCEPHIN) 1 g in dextrose 5 % 50 mL IVPB     1 g 100 mL/hr over 30 Minutes Intravenous  Once 09/06/15 2243 09/06/15 2341      Objective: Filed Weights   09/06/15 2011 09/09/15 0446  Weight: 108.863 kg (240 lb) 108.5 kg (239 lb 3.2 oz)    Intake/Output Summary (Last 24 hours) at 09/09/15 1133 Last data filed at 09/09/15 0900  Gross per 24 hour  Intake 2582.3 ml  Output      0 ml  Net 2582.3 ml     Vitals Filed Vitals:   09/08/15 1432 09/08/15 2155 09/08/15 2320 09/09/15 0446  BP: 159/68 136/59  156/63  Pulse: 81 75 76 75  Temp: 100.7 F (38.2 C) 98 F (36.7 C)  97.6 F (36.4 C)  TempSrc: Axillary Oral  Oral  Resp: 18 18  18   Height:      Weight:    108.5 kg (239 lb 3.2 oz)  SpO2: 93% 94%  94%    Exam:  General:  Pleasant/cooperative  Cardiovascular: regular rate and rhythm, S1/S2 No murmur  Respiratory: clear to auscultation bilaterally - poor air entry - distant breath sounds  Abdomen: Soft, +Bowel sounds, non tender, non distended, no guarding  MSK: right ankle very tender to palpation  Data  Reviewed: Basic Metabolic Panel:  Recent Labs Lab 09/06/15 2113 09/07/15 0110 09/08/15 0400 09/09/15 0418  NA 141 140 133* 136  K 4.0 3.9 4.4 3.8  CL 105 105 99* 100*  CO2  --  GLUCOSE 169* 181* 248* 198*  BUN 22* 22* 23* 16  CREATININE 1.20* 1.36* 1.29* 1.00  CALCIUM  --  9.1 8.6* 9.3   Liver Function Tests: No results for input(s): AST, ALT, ALKPHOS, BILITOT, PROT, ALBUMIN in the last 168 hours. No results for input(s): LIPASE, AMYLASE in the last 168 hours. No results for input(s): AMMONIA in the last 168 hours. CBC:  Recent Labs Lab 09/06/15 2056 09/06/15 2113 09/07/15 0110 09/08/15 0400  WBC 10.5  --  11.8* 10.1  NEUTROABS 7.6  --   --   --   HGB 12.1 13.9 11.7* 11.5*  HCT 37.1 41.0 35.5* 35.2*  MCV 78.9  --   79.2 78.2  PLT 269  --  319 309   Cardiac Enzymes: No results for input(s): CKTOTAL, CKMB, CKMBINDEX, TROPONINI in the last 168 hours. BNP (last 3 results) No results for input(s): BNP in the last 8760 hours.  ProBNP (last 3 results) No results for input(s): PROBNP in the last 8760 hours.  CBG:  Recent Labs Lab 09/08/15 1227 09/08/15 1339 09/08/15 1739 09/08/15 2200 09/09/15 0810  GLUCAP 243* 210* 193* 259* 209*    No results found for this or any previous visit (from the past 240 hour(s)).   Studies: Ct Ankle Right Wo Contrast  09/08/2015  CLINICAL DATA:  Status post fall 3 weeks ago with continued right ankle pain. Possible medial malleolar fracture by prior plain films. Initial encounter. EXAM: CT OF THE RIGHT ANKLE WITHOUT CONTRAST TECHNIQUE: Multidetector CT imaging of the right ankle was performed according to the standard protocol. Multiplanar CT image reconstructions were also generated. COMPARISON:  Plain films the right ankle 09/06/2015. FINDINGS: The axial images incidentally include the medial aspect of the left ankle. The medial malleolus is intact. Chronic calcific tendinopathy of the tibialis posterior tendon accounts for findings on prior plain films. Similar change is seen on the left. No acute or remote fracture is identified. No osteochondral lesion of the talar dome is noted. Except as described above, soft tissues are unremarkable. No tibiotalar joint effusion is identified. Erosive change is seen in the distal fibula and adjacent talus, about the calcaneocuboid joint and in the base of the fifth metatarsal. Erosions are also seen about the middle facet of the subtalar joint. IMPRESSION: Chronic calcific tendinopathy of the tibialis posterior accounts for findings on prior plain films. There is no fracture. Scattered erosions about the hindfoot are worrisome for crystal or inflammatory arthropathy. Electronically Signed   By: Drusilla Kanner M.D.   On: 09/08/2015  16:13   Dg Chest Port 1 View  09/08/2015  CLINICAL DATA:  Hypoxia EXAM: PORTABLE CHEST 1 VIEW COMPARISON:  08/14/2013 FINDINGS: Mild cardiomegaly. Diffuse interstitial prominence throughout the lungs, similar to prior study. No effusions. No acute bony abnormality. Degenerative changes in the left shoulder. IMPRESSION: Cardiomegaly. Diffuse interstitial disease could reflect chronic interstitial lung disease, edema, or a combination. Electronically Signed   By: Charlett Nose M.D.   On: 09/08/2015 12:28    Scheduled Meds:  Scheduled Meds: . atorvastatin  20 mg Oral q1800  . beta carotene w/minerals  1 tablet Oral Daily  . cefTRIAXone (ROCEPHIN)  IV  1 g Intravenous Q24H  . donepezil  5 mg Oral  Daily  . furosemide  40 mg Intravenous BID  . insulin aspart  0-5 Units Subcutaneous QHS  . insulin aspart  0-9 Units Subcutaneous TID WC  . insulin glargine  20 Units Subcutaneous BID  . levothyroxine  50 mcg Oral QAC breakfast  . predniSONE  60 mg Oral Q breakfast  . warfarin  5 mg Oral Once per day on Sun Mon Wed Fri Sat  . warfarin  7.5 mg Oral Once per day on Tue Thu  . Warfarin - Physician Dosing Inpatient   Does not apply q1800   Continuous Infusions:   Time spent on care of this patient: 35 min   Khadijatou Borak UPCHURCH Praveen Coia, DO  09/09/2015, 11:33 AM  LOS: 3 days   [facility] Office  [phone] Pager - Text Page per [email or web address] If 7PM-7AM, please contact night-coverage [email or web address]

## 2015-09-09 NOTE — Care Management Important Message (Signed)
Important Message  Patient Details  Name: Sara Dennis MRN: [id number] Date of Birth: Nov 13, 1921   Medicare Important Message Given:  Yes    Haskell Flirt 09/09/2015, 9:13 AMImportant Message  Patient Details  Name: Sara Dennis MRN: [id number] Date of Birth: 04/10/1922   Medicare Important Message Given:  Yes    Haskell Flirt 09/09/2015, 9:13 AM

## 2015-09-09 NOTE — Progress Notes (Signed)
This CSW spoke with Sara Dennis who reports they have rec'd [facility] auth. Dr Benjamine Mola anticipates dc maybe tomorrow- updated SNF and family-   Reece Levy, MSW, [place]  [phone]

## 2015-09-10 DIAGNOSIS — N189 Chronic kidney disease, unspecified: Secondary | ICD-10-CM

## 2015-09-10 DIAGNOSIS — M109 Gout, unspecified: Secondary | ICD-10-CM

## 2015-09-10 DIAGNOSIS — N179 Acute kidney failure, unspecified: Secondary | ICD-10-CM

## 2015-09-10 DIAGNOSIS — E039 Hypothyroidism, unspecified: Secondary | ICD-10-CM

## 2015-09-10 DIAGNOSIS — M10371 Gout due to renal impairment, right ankle and foot: Secondary | ICD-10-CM

## 2015-09-10 LAB — GLUCOSE, CAPILLARY
GLUCOSE-CAPILLARY: 340 mg/dL — AB (ref 65–99)
Glucose-Capillary: 365 mg/dL — ABNORMAL HIGH (ref 65–99)

## 2015-09-10 LAB — PROTIME-INR
INR: 2.07 — AB (ref 0.00–1.49)
Prothrombin Time: 23.2 seconds — ABNORMAL HIGH (ref 11.6–15.2)

## 2015-09-10 LAB — URINE CULTURE: Culture: >=100000

## 2015-09-10 MED ORDER — INSULIN ASPART 100 UNIT/ML ~~LOC~~ SOLN: 0.0000 [IU] | Freq: Three times a day (TID) | SUBCUTANEOUS | Status: DC

## 2015-09-10 MED ORDER — INSULIN GLARGINE 100 UNIT/ML ~~LOC~~ SOLN: 30.0000 [IU] | Freq: Two times a day (BID) | SUBCUTANEOUS | Status: AC

## 2015-09-10 MED ORDER — INSULIN ASPART 100 UNIT/ML ~~LOC~~ SOLN: 0.0000 [IU] | Freq: Every day | SUBCUTANEOUS | Status: DC

## 2015-09-10 MED ORDER — PREDNISONE 10 MG PO TABS: ORAL_TABLET | ORAL | Status: DC

## 2015-09-10 MED ORDER — ACETAMINOPHEN 325 MG PO TABS: 650.0000 mg | ORAL_TABLET | Freq: Four times a day (QID) | ORAL | Status: AC | PRN

## 2015-09-10 MED ORDER — FUROSEMIDE 40 MG PO TABS: 40.0000 mg | ORAL_TABLET | Freq: Two times a day (BID) | ORAL | Status: DC

## 2015-09-10 MED ORDER — TRAMADOL-ACETAMINOPHEN 37.5-325 MG PO TABS: 1.0000 | ORAL_TABLET | Freq: Four times a day (QID) | ORAL | Status: DC | PRN

## 2015-09-10 NOTE — Progress Notes (Signed)
Physical Therapy Treatment Patient Details Name: Sara Dennis MRN: [id number] DOB: 03-Jun-1922 Today's Date: 09/10/2015    History of Present Illness Sara Dennis is a 81 y.o. female with a history of Chronic Diastolic CHF, Atrial fibrillation on Coumadin Rx, HTN, DM2, Stage III CKD, Hypothyroid and Mild Dementia who was brought to the ED 09/15/15 due to increased confusion, and decreased appetite and weakness over the past 2 -3 days. She was found to have mild dehydration and a UTI  She suffered a fall 3 weeks ago where she twisted her right ankle and has had increased pain since then. An X-ray was performed in the ED and revealed a nondisplaced malleolar fracture, and an Ace wrap was applied in     PT Comments    Assisted pt OOB to [facility] required + 2 assist.  Attempted amb with walker and + 2 assist however pt was unable to progress past 2 feet due to c/o R ankle instability.  Pt stated, "it feels like it's going to give out".  Also, increased anxiety/fear of falling so pt tends to sit quickly uncontrolled.  MAX VC's to complete turns prior to sit.  HIGH FALL RISK. Pt could benefit from ASO R ankle to increase ankle stability to progress gait.   Daughter stated, pt was amb with a cane 3 weeks ago.   Follow Up Recommendations  SNF     Equipment Recommendations       Recommendations for Other Services       Precautions / Restrictions Precautions Precautions: Fall Precaution Comments: incontinent of large volumes Restrictions Weight Bearing Restrictions: No Other Position/Activity Restrictions: No indication for WBS on the R  foot with nondisplaced medial malleolar ankle fracture. Note states" ACE wrap and a hard bottom shoe for ambulation. To F?U with ortho as outpatient.    Mobility  Bed Mobility Overal bed mobility: Needs Assistance Bed Mobility: Supine to Sit     Supine to sit: Mod assist     General bed mobility comments: HOB elevated, use of rails and extra assist  with upper body due to BMI and extra time to scoot self to EOB.  Transfers Overall transfer level: Needs assistance Equipment used: None Transfers: Sit to/from [employer] Sit to Stand: +2 safety/equipment;+2 physical assistance;Mod assist;Max assist Stand pivot transfers: Mod assist;Max assist;+2 physical assistance;+2 safety/equipment       General transfer comment: assisted from elevated bed to [facility] partial turn completion/urgency to sit.  + 2 assist for safety.  Assisted off BSC with 50% VC's on proper hand placement to push self up.  Partial stance.    Ambulation/Gait Ambulation/Gait assistance: +2 physical assistance;+2 safety/equipment;Mod assist;Max assist Ambulation Distance (Feet): 2 Feet Assistive device: Rolling walker (2 wheeled) Gait Pattern/deviations: Step-to pattern;Decreased step length - right;Decreased step length - left;Trunk flexed;Shuffle Gait velocity: decreased   General Gait Details: 50% VC's for upright posture and direction (poor vision, only has one eye).  Urgency to sit due to fear of falling and c/o R ankle instability.  Pt stated "it feels like it wants to give way".     Stairs            Wheelchair Mobility    Modified Rankin (Stroke Patients Only)       Balance                                    Cognition Arousal/Alertness: Awake/alert  Behavior During Therapy: Anxious;Restless (fear of falling)                   General Comments: AxO x3     appears at prior level    Exercises      General Comments        Pertinent Vitals/Pain Pain Assessment: Faces Faces Pain Scale: Hurts little more Pain Location: during transfer and attempted gait Pain Descriptors / Indicators: Discomfort;Grimacing Pain Intervention(s): Monitored during session;Repositioned    Home Living                      Prior Function            PT Goals (current goals can now be found in the care plan section)  Progress towards PT goals: Progressing toward goals    Frequency  Min 3X/week    PT Plan      Co-evaluation             End of Session Equipment Utilized During Treatment: Gait belt   Patient left: in chair;with call bell/phone within reach;with chair alarm set     Time: 0935-1000 PT Time Calculation (min) (ACUTE ONLY): 25 min  Charges:  $Gait Training: 8-22 mins $Therapeutic Activity: 8-22 mins                    G Codes:      Felecia Shelling  PTA [facility]  Acute  Rehab Pager      [phone]

## 2015-09-10 NOTE — Progress Notes (Signed)
Report given to Geronimo Running, nurse at [facility].

## 2015-09-10 NOTE — Plan of Care (Signed)
Problem: Nutrition: Goal: Adequate nutrition will be maintained Outcome: Progressing Patient consumed 100% of her meal this am.

## 2015-09-10 NOTE — Discharge Summary (Signed)
Physician Discharge Summary  Sara Dennis GMW:[id number] DOB: December 31, 1921 DOA: 09/06/2015  PCP: Coralie Common, MD  Admit date: 09/06/2015 Discharge date: 09/10/2015  Time spent: 35 minutes  Recommendations for Outpatient Follow-up:  1. ASO brace 2. SSI-watch BS closely while on steroids 3. Steroid taper for gout 4. BMP 1 week for Cr-- lasix increased 5. Monitor INR   Discharge Diagnoses:  Principal Problem:   Acute encephalopathy Active Problems:   Chronic kidney disease stage III   Diabetes mellitus (HCC)   Atrial flutter, unspecified   UTI (lower urinary tract infection)   Chronic diastolic (congestive) heart failure (HCC)   Acute on chronic renal failure (HCC)   Warfarin-induced coagulopathy (HCC)   Hypothyroidism   Gout   Discharge Condition: improved  Diet recommendation: carb mod/heart healthy  Filed Weights   09/06/15 2011 09/09/15 0446 09/10/15 0515  Weight: 108.863 kg (240 lb) 108.5 kg (239 lb 3.2 oz) 104.7 kg (230 lb 13.2 oz)    History of present illness:  Sara Dennis is a 81 y.o. female with a history of Chronic Diastolic CHF, Atrial fibrillation on Coumadin Rx, HTN, DM2, Stage III CKD, Hypothyroid and Mild Dementia who was brought to the ED due to increased confusion, and decreased appetite and weakness over the past 2 -3 days. She was evaluated in the ED and found to have mild dehydration and a UTI and a Urine culture was sent and she was placed on IV Rocephin. She suffered a fll 3 weeks ago where she twisted her right ankle and has had increased pain since then. An X-ray was performed in the ED and revealed a nondisplaced malleolar fracture, and an Ace wrap was applied in the ED. She was referred for admission.    Hospital Course:  Acute encephalopathy -initially appeared to be secondary to UTI- was improving but now confused again, likely due to narcotics ordered on admission- only takes Ultram at home- will transition to Ultracet   UTI/  leukocytosis -ecoli- treated with rocephin  Hyponatremia - resolved  Acute on chronic diastolic (congestive) heart failure  - increase home lasix-- follow cr closely  Right non-displace malleolar fx- NO FRACTURE SEEN ON CT -- Pt eval done- SNF recommended - gout--  uric acid elevated and give 1 dose of PO prednisone   Chronic kidney disease stage III - stable   Diabetes mellitus 2 - cont sliding scale -watch blood sugars   Atrial flutter, unspecified -cont coumadin - rate controlled without rate controlling meds   Hypothyroidism - synthroid   Procedures:    Consultations:    Discharge Exam: Filed Vitals:   09/09/15 2232 09/10/15 0515  BP: 149/63 120/53  Pulse: 60 64  Temp: 97.2 F (36.2 C) 97.2 F (36.2 C)  Resp: 20 20     Discharge Instructions   Discharge Instructions    Diet - low sodium heart healthy    Complete by:  As directed      Diet Carb Modified    Complete by:  As directed      Discharge instructions    Complete by:  As directed   Taper steroids for gout flare Monitor blood sugars- especially while on steroids     Increase activity slowly    Complete by:  As directed           Current Discharge Medication List    START taking these medications   Details  acetaminophen (TYLENOL) 325 MG tablet Take 2 tablets (650 mg total) by mouth  every 6 (six) hours as needed for mild pain (or Fever >/= 101).    !! insulin aspart (NOVOLOG) 100 UNIT/ML injection Inject 0-5 Units into the skin at bedtime. Qty: 10 mL, Refills: 11    !! insulin aspart (NOVOLOG) 100 UNIT/ML injection Inject 0-9 Units into the skin 3 (three) times daily with meals. Qty: 10 mL, Refills: 11    predniSONE (DELTASONE) 10 MG tablet 60 mg x 1 day, then 50 mg x 1 days, then 40 mg x 1 days, then 30 mg x 1 days, then 20 mg x 1 day, then 10 mg x 1 days then 5 mg x 1 day then stop    traMADol-acetaminophen (ULTRACET) 37.5-325 MG tablet Take 1 tablet by mouth every 6 (six)  hours as needed for moderate pain. Qty: 30 tablet, Refills: 0     !! - Potential duplicate medications found. Please discuss with provider.    CONTINUE these medications which have CHANGED   Details  furosemide (LASIX) 40 MG tablet Take 1 tablet (40 mg total) by mouth 2 (two) times daily. Qty: 30 tablet    insulin glargine (LANTUS) 100 UNIT/ML injection Inject 0.3 mLs (30 Units total) into the skin 2 (two) times daily. Qty: 10 mL, Refills: 11      CONTINUE these medications which have NOT CHANGED   Details  atorvastatin (LIPITOR) 20 MG tablet Take 1 tablet (20 mg total) by mouth daily at 6 PM. Qty: 30 tablet, Refills: 1    beta carotene w/minerals (OCUVITE) tablet Take 1 tablet by mouth daily.    donepezil (ARICEPT) 5 MG tablet Take 5 mg by mouth every morning. Prefers to take in the morning    levothyroxine (SYNTHROID, LEVOTHROID) 50 MCG tablet Take 50 mcg by mouth daily before breakfast.    warfarin (COUMADIN) 5 MG tablet Take 5-7.5 mg by mouth daily at 6 PM. Take 5mg  every evening except take 7.5 mg on Tuesday and Thursday      STOP taking these medications     traMADol (ULTRAM) 50 MG tablet      trimethoprim (TRIMPEX) 100 MG tablet        Allergies  Allergen Reactions  . Gemfibrozil Nausea And Vomiting and Other (See Comments)    Sick and give urine infection  . Iohexol Itching  . Ivp Dye [Iodinated Diagnostic Agents] Itching    itching  . Bactrim Itching, Nausea And Vomiting and Rash    And diarrhea  . Sulfamethoxazole-Trimethoprim Itching, Nausea And Vomiting and Rash    And diarrhea      The results of significant diagnostics from this hospitalization (including imaging, microbiology, ancillary and laboratory) are listed below for reference.    Significant Diagnostic Studies: Dg Ankle Complete Right  09/06/2015  CLINICAL DATA:  Generalized body weakness, fell 3 weeks ago. Ankle pain. EXAM: RIGHT ANKLE - COMPLETE 3+ VIEW COMPARISON:  RIGHT ankle  radiograph Feb 02, 2013 FINDINGS: Linear lucency tip of the medial malleolus seen only on the AP view. Ankle mortise appears congruent and tibial fibular syndesmosis intact. Osteopenia without destructive bony lesions. Medial ankle soft tissue swelling without subcutaneous gas or radiopaque foreign bodies. Moderate plantar calcaneal spur. IMPRESSION: Equivocal findings for acute nondisplaced medial malleolus fracture, limited by osteopenia. No dislocation. Electronically Signed   By: Awilda Metro M.D.   On: 09/06/2015 20:59   Ct Ankle Right Wo Contrast  09/08/2015  CLINICAL DATA:  Status post fall 3 weeks ago with continued right ankle pain. Possible medial malleolar  fracture by prior plain films. Initial encounter. EXAM: CT OF THE RIGHT ANKLE WITHOUT CONTRAST TECHNIQUE: Multidetector CT imaging of the right ankle was performed according to the standard protocol. Multiplanar CT image reconstructions were also generated. COMPARISON:  Plain films the right ankle 09/06/2015. FINDINGS: The axial images incidentally include the medial aspect of the left ankle. The medial malleolus is intact. Chronic calcific tendinopathy of the tibialis posterior tendon accounts for findings on prior plain films. Similar change is seen on the left. No acute or remote fracture is identified. No osteochondral lesion of the talar dome is noted. Except as described above, soft tissues are unremarkable. No tibiotalar joint effusion is identified. Erosive change is seen in the distal fibula and adjacent talus, about the calcaneocuboid joint and in the base of the fifth metatarsal. Erosions are also seen about the middle facet of the subtalar joint. IMPRESSION: Chronic calcific tendinopathy of the tibialis posterior accounts for findings on prior plain films. There is no fracture. Scattered erosions about the hindfoot are worrisome for crystal or inflammatory arthropathy. Electronically Signed   By: Drusilla Kanner M.D.   On:  09/08/2015 16:13   Dg Chest Port 1 View  09/08/2015  CLINICAL DATA:  Hypoxia EXAM: PORTABLE CHEST 1 VIEW COMPARISON:  08/14/2013 FINDINGS: Mild cardiomegaly. Diffuse interstitial prominence throughout the lungs, similar to prior study. No effusions. No acute bony abnormality. Degenerative changes in the left shoulder. IMPRESSION: Cardiomegaly. Diffuse interstitial disease could reflect chronic interstitial lung disease, edema, or a combination. Electronically Signed   By: Charlett Nose M.D.   On: 09/08/2015 12:28    Microbiology: Recent Results (from the past 240 hour(s))  Urine culture     Status: None   Collection Time: 09/06/15  9:20 PM  Result Value Ref Range Status   Specimen Description URINE, CATHETERIZED  Final   Special Requests NONE  Final   Culture   Final    >=100,000 COLONIES/mL ESCHERICHIA COLI Performed at [facility]    Report Status 09/10/2015 FINAL  Final   Organism ID, Bacteria ESCHERICHIA COLI  Final      Susceptibility   Escherichia coli - MIC*    AMPICILLIN >=32 RESISTANT Resistant     CEFAZOLIN <=4 SENSITIVE Sensitive     CEFTRIAXONE <=1 SENSITIVE Sensitive     CIPROFLOXACIN >=4 RESISTANT Resistant     GENTAMICIN <=1 SENSITIVE Sensitive     IMIPENEM <=0.25 SENSITIVE Sensitive     NITROFURANTOIN <=16 SENSITIVE Sensitive     TRIMETH/SULFA >=320 RESISTANT Resistant     AMPICILLIN/SULBACTAM 16 INTERMEDIATE Intermediate     PIP/TAZO <=4 SENSITIVE Sensitive     * >=100,000 COLONIES/mL ESCHERICHIA COLI  Culture, blood (routine x 2)     Status: None (Preliminary result)   Collection Time: 09/08/15  3:03 PM  Result Value Ref Range Status   Specimen Description BLOOD LEFT ARM  Final   Special Requests BOTTLES DRAWN AEROBIC AND ANAEROBIC 7CC  Final   Culture   Final    NO GROWTH < 24 HOURS Performed at [facility]    Report Status PENDING  Incomplete  Culture, blood (routine x 2)     Status: None (Preliminary result)   Collection Time: 09/08/15   3:10 PM  Result Value Ref Range Status   Specimen Description BLOOD RIGHT ARM  Final   Special Requests BOTTLES DRAWN AEROBIC AND ANAEROBIC 10CC  Final   Culture   Final    NO GROWTH < 24 HOURS Performed at [facility]    Report Status PENDING  Incomplete     Labs: Basic Metabolic Panel:  Recent Labs Lab 09/06/15 2113 09/07/15 0110 09/08/15 0400 09/09/15 0418  NA 141 140 133* 136  K 4.0 3.9 4.4 3.8  CL 105 105 99* 100*  CO2  --  GLUCOSE 169* 181* 248* 198*  BUN 22* 22* 23* 16  CREATININE 1.20* 1.36* 1.29* 1.00  CALCIUM  --  9.1 8.6* 9.3   Liver Function Tests: No results for input(s): AST, ALT, ALKPHOS, BILITOT, PROT, ALBUMIN in the last 168 hours. No results for input(s): LIPASE, AMYLASE in the last 168 hours. No results for input(s): AMMONIA in the last 168 hours. CBC:  Recent Labs Lab 09/06/15 2056 09/06/15 2113 09/07/15 0110 09/08/15 0400  WBC 10.5  --  11.8* 10.1  NEUTROABS 7.6  --   --   --   HGB 12.1 13.9 11.7* 11.5*  HCT 37.1 41.0 35.5* 35.2*  MCV 78.9  --  79.2 78.2  PLT 269  --  319 309   Cardiac Enzymes: No results for input(s): CKTOTAL, CKMB, CKMBINDEX, TROPONINI in the last 168 hours. BNP: BNP (last 3 results) No results for input(s): BNP in the last 8760 hours.  ProBNP (last 3 results) No results for input(s): PROBNP in the last 8760 hours.  CBG:  Recent Labs Lab 09/09/15 0810 09/09/15 1137 09/09/15 1630 09/09/15 2157 09/10/15 0752  GLUCAP 209* 235* 288* 368* 340*       Signed:  Mert Dietrick UPCHURCH Angelus Hoopes  [facility] 09/10/2015, 11:01 AM

## 2015-09-10 NOTE — Clinical Social Work Placement (Signed)
   CLINICAL SOCIAL WORK PLACEMENT  NOTE  Date:  09/10/2015  Patient Details  Name: Sara Dennis MRN: [id number] Date of Birth: January 06, 1922  Clinical Social Work is seeking post-discharge placement for this patient at the Skilled  Nursing Facility level of care (*CSW will initial, date and re-position this form in  chart as items are completed):  Yes   Patient/family provided with [place] Clinical Social Work Department's list of facilities offering this level of care within the geographic area requested by the patient (or if unable, by the patient's family).  Yes   Patient/family informed of their freedom to choose among providers that offer the needed level of care, that participate in Medicare, Medicaid or managed care program needed by the patient, have an available bed and are willing to accept the patient.  Yes   Patient/family informed of 's ownership interest in [facility] and [facility], as well as of the fact that they are under no obligation to receive care at these facilities.  PASRR submitted to EDS on       PASRR number received on       Existing PASRR number confirmed on 09/08/15     FL2 transmitted to all facilities in geographic area requested by pt/family on 09/08/15     FL2 transmitted to all facilities within larger geographic area on       Patient informed that his/her managed care company has contracts with or will negotiate with certain facilities, including the following:        Yes   Patient/family informed of bed offers received.  Patient chooses bed at [facility]     Physician recommends and patient chooses bed at      Patient to be transferred to [facility] on 09/10/15.  Patient to be transferred to facility by ambulance Sharin Mons)     Patient family notified on 09/10/15 of transfer.  Name of family member notified:  pt notified at bedside and pt daughter, Annice Pih notified via telephone     PHYSICIAN Please sign FL2      Additional Comment:    _______________________________________________ Orson Eva, LCSW 09/10/2015, 12:40 PM

## 2015-09-10 NOTE — Progress Notes (Signed)
Patient d/c to SNF, [facility],patient is stable. Denies pain. Transported via non emergency ambulance.

## 2015-09-10 NOTE — Progress Notes (Signed)
Pt for discharge to [facility].  CSW facilitated pt discharge needs including contacting facility, faxing pt discharge information via [employer], confirming [facility] received insurance authorization from [employer], discussing with pt at bedside and pt daughter, Annice Pih via telephone, providing RN phone number to call report, and arranging ambulance transport scheduled for 1:30 pm pick up.   Pt daughter appreciative of assistance and pt eager to transition to [facility].   No further social work needs identified at this time.  CSW signing off.   Loletta Specter, MSW, LCSW Clinical Social Work [phone]

## 2015-09-13 LAB — CULTURE, BLOOD (ROUTINE X 2)
CULTURE: NO GROWTH
CULTURE: NO GROWTH

## 2015-09-15 ENCOUNTER — Ambulatory Visit (INDEPENDENT_AMBULATORY_CARE_PROVIDER_SITE_OTHER): Payer: Medicare HMO | Admitting: Internal Medicine

## 2015-09-15 ENCOUNTER — Encounter: Payer: Self-pay | Admitting: Internal Medicine

## 2015-09-15 VITALS — BP 156/60 | HR 50 | Ht 65.0 in | Wt 233.0 lb

## 2015-09-15 DIAGNOSIS — I4892 Unspecified atrial flutter: Secondary | ICD-10-CM | POA: Diagnosis not present

## 2015-09-15 DIAGNOSIS — I501 Left ventricular failure: Secondary | ICD-10-CM

## 2015-09-15 DIAGNOSIS — R001 Bradycardia, unspecified: Secondary | ICD-10-CM

## 2015-09-15 DIAGNOSIS — I5032 Chronic diastolic (congestive) heart failure: Secondary | ICD-10-CM | POA: Diagnosis not present

## 2015-09-15 NOTE — Progress Notes (Signed)
Cardiology Office Note Date:  09/15/2015  Patient ID:  [name], [name] Jun 29, 1922, MRN [id number] PCP:  Coralie Common, MD  Cardiologist:  Dr. Rennis Golden  Chief Complaint:  "Want to go home"  History of Present Illness: Sara Dennis is a 81 y.o. female with history of type 2 diabetes, hypothyroidism, h/o recurrent PE (1978 & 2012), glaucoma with significantly decreased vision, permanent atrial fibrillation/atrial flutter on Coumadin, chronic diastolic CHF, CKD stage III, mild dementia, mod-severe pulm HTN by echo 05/2015, RBBB, & recent bradycardia who presents for post-hospital follow-up. She was recently admitted to [facility] with shoulder pain, weakness, and SOB and was found to have atypical atrial flutter with slow ventricular response with HR in the 40s-50s, occasionally dipping into the 30s overnight. INR was therapeutic. She was found to have minimally elevated flat troponin (0.1) felt more likely due to demand ischemia in the setting of concomitant pulmonary edema. She was treated with IV Lasix. She was seen by EP and it was felt unclear how symptomatic she was related to her HR - the patient was clear in her decision to avoid invasive procedures at this time. ASA was discontinued per cardiology due to concomitant warfarin use, and timolol eye drops were also discontinued to avoid negative chronotropic agents- she was instructed to discuss eye drop alternatives with doctor that prescribed these. She was also tx for UTI. 2D Echo 06/12/15: severe LVH, EF 60-65%, stage 2 DD, moderate-severe pulmonary HTN, mod TR, mod bi-atrial enlargement.  She comes in today for follow-up with her daughter and grandson. Everyone agrees she is doing well - she has felt much better since discharge. Her weight is up in the office today but she is wearing heavy shoes and has not noticed any signs of volume overload. She watches salt intake. No SOB, orthopnea, LEE, dizziness, chest pain, syncope or bleeding. Uses cane to get  around at home.  I saw Sara Dennis today in the office. She is currently in rehabilitation at [facility]. She recently was hospitalized for UTI, encephalopathy and right ankle/foot gout. She is doing much better and wants to be home for Christmas. She has chronic a-fib which is rate-controlled. Her INR was elevated at admission and will be followed closely.  Past Medical History  Diagnosis Date  . Diabetes mellitus   . Chronic diastolic CHF (congestive heart failure) (HCC)   . Pulmonary embolus (HCC) 03-23-11    a. 1978 and 2012.  . Arthritis     left leg  . UTI (lower urinary tract infection)   . Hypothyroidism   . Permanent atrial fibrillation (HCC)   . Glaucoma     a. with significantly decreased vision.  . Chronic atrial flutter (HCC)   . Bradycardia     a. 05/2015: HR 40s-50s, occ 30s at night - not clear how much symptoms were r/t this, patient declined PPM at that time.  . CKD (chronic kidney disease), stage III   . Mild dementia   . Moderate to severe pulmonary hypertension (HCC) By echo 05/2015  . RBBB     Past Surgical History  Procedure Laterality Date  . Kisney stone removal  1978    removed thru bladder  . Cystoscopy w/ ureteral stent placement  09/06/2011    Procedure: CYSTOSCOPY WITH RETROGRADE PYELOGRAM/URETERAL STENT PLACEMENT;  Surgeon: Valetta Fuller, MD;  Location: [facility]S;  Service: Urology;  Laterality: Right;  cysto double j stent placement  . Ureteroscopy  09/06/2011  Procedure: URETEROSCOPY;  Surgeon: Valetta Fuller, MD;  Location: [facility] ORS;  Service: Urology;  Laterality: Right;    Current Outpatient Prescriptions  Medication Sig Dispense Refill  . acetaminophen (TYLENOL) 325 MG tablet Take 2 tablets (650 mg total) by mouth every 6 (six) hours as needed for mild pain (or Fever >/= 101).    Marland Kitchen atorvastatin (LIPITOR) 20 MG tablet Take 1 tablet (20 mg total) by mouth daily at 6 PM. 30 tablet 1  . beta carotene w/minerals  (OCUVITE) tablet Take 1 tablet by mouth daily.    Marland Kitchen donepezil (ARICEPT) 5 MG tablet Take 5 mg by mouth every morning. Prefers to take in the morning    . furosemide (LASIX) 40 MG tablet Take 1 tablet (40 mg total) by mouth 2 (two) times daily. 30 tablet   . insulin aspart (NOVOLOG) 100 UNIT/ML injection Inject 0-5 Units into the skin at bedtime. 10 mL 11  . insulin aspart (NOVOLOG) 100 UNIT/ML injection Inject 0-9 Units into the skin 3 (three) times daily with meals. 10 mL 11  . insulin glargine (LANTUS) 100 UNIT/ML injection Inject 0.3 mLs (30 Units total) into the skin 2 (two) times daily. 10 mL 11  . levothyroxine (SYNTHROID, LEVOTHROID) 50 MCG tablet Take 50 mcg by mouth daily before breakfast.    . predniSONE (DELTASONE) 10 MG tablet 60 mg x 1 day, then 50 mg x 1 days, then 40 mg x 1 days, then 30 mg x 1 days, then 20 mg x 1 day, then 10 mg x 1 days then 5 mg x 1 day then stop    . traMADol-acetaminophen (ULTRACET) 37.5-325 MG tablet Take 1 tablet by mouth every 6 (six) hours as needed for moderate pain. 30 tablet 0  . warfarin (COUMADIN) 5 MG tablet Take 5-7.5 mg by mouth daily at 6 PM. Take  every evening except take 7.5 mg on Tuesday and Thursday     No current facility-administered medications for this visit.    Allergies:   Gemfibrozil; Iohexol; Ivp dye; Bactrim; and Sulfamethoxazole-trimethoprim   Social History:  The patient  reports that she has never smoked. She has never used smokeless tobacco. She reports that she does not drink alcohol or use illicit drugs.   Family History:  The patient's family history is not on file.  ROS:  Please see the history of present illness.  All other systems are reviewed and otherwise negative.   PHYSICAL EXAM:  VS:  BP 156/60 mmHg  Pulse 50  Ht  (1.651 m)  Wt 233 lb (105.688 kg)  BMI 38.77 kg/m2 BMI: Body mass index is 38.77 kg/(m^2). Well nourished, well developed obese elderly AAF in no acute distress HEENT: normocephalic,  atraumatic Neck: no JVD, carotid bruits or masses Cardiac:  normal S1, S2; irregularly irregular; no murmurs, rubs, or gallops Lungs:  clear to auscultation bilaterally, faint basilar crackles Abd: soft, nontender, no hepatomegaly, + BS MS: no deformity or atrophy Ext: no edema, right ankle brace in place Skin: warm and dry, no rash Neuro:  moves all extremities spontaneously, no focal abnormalities noted, follows commands Psych: euthymic mood, full affect   EKG:   A-fib, bifascicular block - unchanged at rate of 50  Recent Labs: 06/27/2015: TSH 1.985 09/08/2015: Hemoglobin 11.5*; Platelets 309 09/09/2015: BUN 16; Creatinine, Ser 1.00; Potassium 3.8; Sodium 136  No results found for requested labs within last 365 days.   Estimated Creatinine Clearance: 44.3 mL/min (by C-G formula based on Cr of 1).  Wt Readings from Last 3 Encounters:  09/15/15 233 lb (105.688 kg)  09/10/15 230 lb 13.2 oz (104.7 kg)  06/27/15 240 lb 6.4 oz (109.045 kg)     Other studies reviewed: Additional studies/records reviewed today include: summarized above  ASSESSMENT AND PLAN:  1. Chronic atrial flutter/atrial fibrillation with bradycardia - being managed conservatively. HR is in the 50's today. She has had no recurrent symptoms. INRs are followed by PCP per daughter. No bleeding reported. 2. Chronic diastolic CHF - lasix was increased in the hospital. Her weight is less than at discharge. She has no shortness of breath or edema on exam. 3. CKD stage III - further monitoring per PCP - he recently d/c'd medications that may have been contributing to this. 4. Mod-severe pulmonary HTN - likely 2/2 #2. Conservative therapy recommended. 5. Hypothyroidism - plan per PCP.  Current medicines are reviewed at length with the patient today.  The patient did not have any concerns regarding medicines.  Chrystie Nose, MD, [facility] Attending Cardiologist [facility]   09/15/2015 6:01 PM

## 2015-09-15 NOTE — Patient Instructions (Signed)
Dr Hilty has made no changes today in your current medications or treatment plan.  Your physician recommends that you schedule a follow-up appointment in 6 months. You will receive a reminder letter in the mail two months in advance. If you don't receive a letter, please call our office to schedule the follow-up appointment.  If you need a refill on your cardiac medications before your next appointment, please call your pharmacy. 

## 2016-02-04 ENCOUNTER — Emergency Department (HOSPITAL_COMMUNITY): Payer: Medicare HMO

## 2016-02-04 ENCOUNTER — Encounter (HOSPITAL_COMMUNITY): Payer: Self-pay

## 2016-02-04 ENCOUNTER — Inpatient Hospital Stay (HOSPITAL_COMMUNITY)
Admission: EM | Admit: 2016-02-04 | Discharge: 2016-02-08 | DRG: 689 | Disposition: A | Discharge status: Home Health | Payer: Medicare HMO | Attending: Internal Medicine | Admitting: Internal Medicine

## 2016-02-04 DIAGNOSIS — Z882 Allergy status to sulfonamides status: Secondary | ICD-10-CM | POA: Diagnosis not present

## 2016-02-04 DIAGNOSIS — Z87440 Personal history of urinary (tract) infections: Secondary | ICD-10-CM

## 2016-02-04 DIAGNOSIS — H547 Unspecified visual loss: Secondary | ICD-10-CM | POA: Diagnosis present

## 2016-02-04 DIAGNOSIS — Z8249 Family history of ischemic heart disease and other diseases of the circulatory system: Secondary | ICD-10-CM

## 2016-02-04 DIAGNOSIS — I4892 Unspecified atrial flutter: Secondary | ICD-10-CM | POA: Diagnosis present

## 2016-02-04 DIAGNOSIS — I4510 Unspecified right bundle-branch block: Secondary | ICD-10-CM | POA: Diagnosis present

## 2016-02-04 DIAGNOSIS — Z7901 Long term (current) use of anticoagulants: Secondary | ICD-10-CM

## 2016-02-04 DIAGNOSIS — I4891 Unspecified atrial fibrillation: Secondary | ICD-10-CM | POA: Diagnosis not present

## 2016-02-04 DIAGNOSIS — Z794 Long term (current) use of insulin: Secondary | ICD-10-CM

## 2016-02-04 DIAGNOSIS — E860 Dehydration: Secondary | ICD-10-CM | POA: Diagnosis present

## 2016-02-04 DIAGNOSIS — E119 Type 2 diabetes mellitus without complications: Secondary | ICD-10-CM

## 2016-02-04 DIAGNOSIS — G9341 Metabolic encephalopathy: Secondary | ICD-10-CM | POA: Diagnosis present

## 2016-02-04 DIAGNOSIS — F0390 Unspecified dementia without behavioral disturbance: Secondary | ICD-10-CM | POA: Diagnosis present

## 2016-02-04 DIAGNOSIS — R9089 Other abnormal findings on diagnostic imaging of central nervous system: Secondary | ICD-10-CM

## 2016-02-04 DIAGNOSIS — M1990 Unspecified osteoarthritis, unspecified site: Secondary | ICD-10-CM | POA: Diagnosis present

## 2016-02-04 DIAGNOSIS — R4182 Altered mental status, unspecified: Secondary | ICD-10-CM | POA: Diagnosis present

## 2016-02-04 DIAGNOSIS — I482 Chronic atrial fibrillation: Secondary | ICD-10-CM | POA: Diagnosis present

## 2016-02-04 DIAGNOSIS — E1122 Type 2 diabetes mellitus with diabetic chronic kidney disease: Secondary | ICD-10-CM | POA: Diagnosis present

## 2016-02-04 DIAGNOSIS — Z91041 Radiographic dye allergy status: Secondary | ICD-10-CM

## 2016-02-04 DIAGNOSIS — Z86711 Personal history of pulmonary embolism: Secondary | ICD-10-CM | POA: Diagnosis not present

## 2016-02-04 DIAGNOSIS — Z79891 Long term (current) use of opiate analgesic: Secondary | ICD-10-CM | POA: Diagnosis not present

## 2016-02-04 DIAGNOSIS — N390 Urinary tract infection, site not specified: Principal | ICD-10-CM | POA: Diagnosis present

## 2016-02-04 DIAGNOSIS — Z7982 Long term (current) use of aspirin: Secondary | ICD-10-CM

## 2016-02-04 DIAGNOSIS — M25511 Pain in right shoulder: Secondary | ICD-10-CM | POA: Diagnosis present

## 2016-02-04 DIAGNOSIS — M542 Cervicalgia: Secondary | ICD-10-CM

## 2016-02-04 DIAGNOSIS — I272 Other secondary pulmonary hypertension: Secondary | ICD-10-CM | POA: Diagnosis present

## 2016-02-04 DIAGNOSIS — I5032 Chronic diastolic (congestive) heart failure: Secondary | ICD-10-CM | POA: Diagnosis present

## 2016-02-04 DIAGNOSIS — Z888 Allergy status to other drugs, medicaments and biological substances status: Secondary | ICD-10-CM

## 2016-02-04 DIAGNOSIS — R451 Restlessness and agitation: Secondary | ICD-10-CM | POA: Diagnosis not present

## 2016-02-04 DIAGNOSIS — G9340 Encephalopathy, unspecified: Secondary | ICD-10-CM | POA: Diagnosis not present

## 2016-02-04 DIAGNOSIS — N3000 Acute cystitis without hematuria: Secondary | ICD-10-CM | POA: Diagnosis not present

## 2016-02-04 DIAGNOSIS — E039 Hypothyroidism, unspecified: Secondary | ICD-10-CM | POA: Diagnosis present

## 2016-02-04 DIAGNOSIS — N183 Chronic kidney disease, stage 3 (moderate): Secondary | ICD-10-CM | POA: Diagnosis present

## 2016-02-04 DIAGNOSIS — Z79899 Other long term (current) drug therapy: Secondary | ICD-10-CM

## 2016-02-04 DIAGNOSIS — N189 Chronic kidney disease, unspecified: Secondary | ICD-10-CM | POA: Diagnosis present

## 2016-02-04 DIAGNOSIS — R442 Other hallucinations: Secondary | ICD-10-CM | POA: Diagnosis present

## 2016-02-04 DIAGNOSIS — R52 Pain, unspecified: Secondary | ICD-10-CM

## 2016-02-04 DIAGNOSIS — H409 Unspecified glaucoma: Secondary | ICD-10-CM | POA: Diagnosis present

## 2016-02-04 LAB — COMPREHENSIVE METABOLIC PANEL
ALBUMIN: 4 g/dL (ref 3.5–5.0)
ALK PHOS: 56 U/L (ref 38–126)
ALT: 10 U/L — ABNORMAL LOW (ref 14–54)
ANION GAP: 13 (ref 5–15)
AST: 19 U/L (ref 15–41)
BILIRUBIN TOTAL: 0.7 mg/dL (ref 0.3–1.2)
BUN: 19 mg/dL (ref 6–20)
CALCIUM: 9.9 mg/dL (ref 8.9–10.3)
CO2: 26 mmol/L (ref 22–32)
CREATININE: 1.17 mg/dL — AB (ref 0.44–1.00)
Chloride: 104 mmol/L (ref 101–111)
GFR, EST AFRICAN AMERICAN: 46 mL/min — AB (ref >60)
GFR, EST NON AFRICAN AMERICAN: 39 mL/min — AB (ref >60)
Glucose, Bld: 126 mg/dL — ABNORMAL HIGH (ref 65–99)
POTASSIUM: 4.4 mmol/L (ref 3.5–5.1)
Sodium: 143 mmol/L (ref 135–145)
Total Protein: 7.3 g/dL (ref 6.5–8.1)

## 2016-02-04 LAB — CBC WITH DIFFERENTIAL/PLATELET
Basophils Absolute: 0.1 10*3/uL (ref 0.0–0.1)
Basophils Relative: 1 %
EOS ABS: 0.1 10*3/uL (ref 0.0–0.7)
EOS PCT: 1 %
HCT: 41.3 % (ref 36.0–46.0)
Hemoglobin: 13.9 g/dL (ref 12.0–15.0)
LYMPHS ABS: 1.9 10*3/uL (ref 0.7–4.0)
LYMPHS PCT: 18 %
MCH: 25.8 pg — AB (ref 26.0–34.0)
MCHC: 33.7 g/dL (ref 30.0–36.0)
MCV: 76.8 fL — AB (ref 78.0–100.0)
MONO ABS: 0.8 10*3/uL (ref 0.1–1.0)
Monocytes Relative: 8 %
Neutro Abs: 7.9 10*3/uL — ABNORMAL HIGH (ref 1.7–7.7)
Neutrophils Relative %: 72 %
PLATELETS: 247 10*3/uL (ref 150–400)
RBC: 5.38 MIL/uL — AB (ref 3.87–5.11)
RDW: 17.3 % — AB (ref 11.5–15.5)
WBC: 10.8 10*3/uL — AB (ref 4.0–10.5)

## 2016-02-04 LAB — CREATININE, SERUM
CREATININE: 1.1 mg/dL — AB (ref 0.44–1.00)
GFR, EST AFRICAN AMERICAN: 49 mL/min — AB (ref >60)
GFR, EST NON AFRICAN AMERICAN: 43 mL/min — AB (ref >60)

## 2016-02-04 LAB — URINE MICROSCOPIC-ADD ON

## 2016-02-04 LAB — URINALYSIS, ROUTINE W REFLEX MICROSCOPIC
BILIRUBIN URINE: NEGATIVE
GLUCOSE, UA: NEGATIVE mg/dL
HGB URINE DIPSTICK: NEGATIVE
KETONES UR: NEGATIVE mg/dL
Nitrite: NEGATIVE
PROTEIN: 30 mg/dL — AB
Specific Gravity, Urine: 1.021 (ref 1.005–1.030)
pH: 6.5 (ref 5.0–8.0)

## 2016-02-04 LAB — PROTIME-INR
INR: 1.22 (ref 0.00–1.49)
PROTHROMBIN TIME: 15.5 s — AB (ref 11.6–15.2)

## 2016-02-04 LAB — GLUCOSE, CAPILLARY
Glucose-Capillary: 133 mg/dL — ABNORMAL HIGH (ref 65–99)
Glucose-Capillary: 162 mg/dL — ABNORMAL HIGH (ref 65–99)

## 2016-02-04 LAB — CBC
HCT: 42.5 % (ref 36.0–46.0)
HEMOGLOBIN: 14.4 g/dL (ref 12.0–15.0)
MCH: 25.9 pg — ABNORMAL LOW (ref 26.0–34.0)
MCHC: 33.9 g/dL (ref 30.0–36.0)
MCV: 76.3 fL — ABNORMAL LOW (ref 78.0–100.0)
PLATELETS: 288 10*3/uL (ref 150–400)
RBC: 5.57 MIL/uL — AB (ref 3.87–5.11)
RDW: 17.1 % — ABNORMAL HIGH (ref 11.5–15.5)
WBC: 11 10*3/uL — AB (ref 4.0–10.5)

## 2016-02-04 LAB — TSH: TSH: 1.786 u[IU]/mL (ref 0.350–4.500)

## 2016-02-04 MED ORDER — DEXTROSE 5 % IV SOLN
1.0000 g | INTRAVENOUS | Status: DC
  Administered 2016-02-05: 1 g via INTRAVENOUS
  Filled 2016-02-04 (×3): qty 10

## 2016-02-04 MED ORDER — CETYLPYRIDINIUM CHLORIDE 0.05 % MT LIQD
7.0000 mL | Freq: Two times a day (BID) | OROMUCOSAL | Status: DC
  Administered 2016-02-04 – 2016-02-08 (×6): 7 mL via OROMUCOSAL

## 2016-02-04 MED ORDER — INSULIN ASPART 100 UNIT/ML ~~LOC~~ SOLN
0.0000 [IU] | SUBCUTANEOUS | Status: DC
  Administered 2016-02-04: 2 [IU] via SUBCUTANEOUS
  Administered 2016-02-04: 3 [IU] via SUBCUTANEOUS
  Administered 2016-02-05: 2 [IU] via SUBCUTANEOUS

## 2016-02-04 MED ORDER — ENOXAPARIN SODIUM 100 MG/ML ~~LOC~~ SOLN
100.0000 mg | Freq: Two times a day (BID) | SUBCUTANEOUS | Status: DC
  Administered 2016-02-04 – 2016-02-08 (×8): 100 mg via SUBCUTANEOUS
  Filled 2016-02-04 (×10): qty 1

## 2016-02-04 MED ORDER — WARFARIN - PHARMACIST DOSING INPATIENT: Freq: Every day | Status: DC

## 2016-02-04 MED ORDER — LORAZEPAM 2 MG/ML IJ SOLN
1.0000 mg | Freq: Once | INTRAMUSCULAR | Status: AC
  Administered 2016-02-04: 1 mg via INTRAVENOUS
  Filled 2016-02-04: qty 1

## 2016-02-04 MED ORDER — SODIUM CHLORIDE 0.9 % IV SOLN: 250.0000 mL | INTRAVENOUS | Status: DC | PRN

## 2016-02-04 MED ORDER — ONDANSETRON HCL 4 MG PO TABS: 4.0000 mg | ORAL_TABLET | Freq: Four times a day (QID) | ORAL | Status: DC | PRN

## 2016-02-04 MED ORDER — SODIUM CHLORIDE 0.9% FLUSH
3.0000 mL | Freq: Two times a day (BID) | INTRAVENOUS | Status: DC
  Administered 2016-02-04 – 2016-02-06 (×3): 3 mL via INTRAVENOUS

## 2016-02-04 MED ORDER — FENTANYL CITRATE (PF) 100 MCG/2ML IJ SOLN
12.5000 ug | Freq: Once | INTRAMUSCULAR | Status: AC
  Administered 2016-02-04: 12.5 ug via INTRAVENOUS
  Filled 2016-02-04: qty 2

## 2016-02-04 MED ORDER — ONDANSETRON HCL 4 MG/2ML IJ SOLN: 4.0000 mg | Freq: Four times a day (QID) | INTRAMUSCULAR | Status: DC | PRN

## 2016-02-04 MED ORDER — DONEPEZIL HCL 5 MG PO TABS
5.0000 mg | ORAL_TABLET | ORAL | Status: DC
  Administered 2016-02-05 – 2016-02-08 (×4): 5 mg via ORAL
  Filled 2016-02-04 (×5): qty 1

## 2016-02-04 MED ORDER — ACETAMINOPHEN 325 MG PO TABS
650.0000 mg | ORAL_TABLET | Freq: Four times a day (QID) | ORAL | Status: DC | PRN
  Administered 2016-02-05 – 2016-02-07 (×3): 650 mg via ORAL
  Filled 2016-02-04 (×3): qty 2

## 2016-02-04 MED ORDER — SODIUM CHLORIDE 0.9% FLUSH: 3.0000 mL | INTRAVENOUS | Status: DC | PRN

## 2016-02-04 MED ORDER — OCUVITE PO TABS
1.0000 | ORAL_TABLET | Freq: Every day | ORAL | Status: DC
  Administered 2016-02-05 – 2016-02-08 (×4): 1 via ORAL
  Filled 2016-02-04 (×4): qty 1

## 2016-02-04 MED ORDER — DEXTROSE 5 % IV SOLN
1.0000 g | Freq: Once | INTRAVENOUS | Status: AC
  Administered 2016-02-04: 1 g via INTRAVENOUS
  Filled 2016-02-04: qty 10

## 2016-02-04 MED ORDER — WARFARIN SODIUM 6 MG PO TABS
6.0000 mg | ORAL_TABLET | Freq: Once | ORAL | Status: AC
  Administered 2016-02-04: 6 mg via ORAL
  Filled 2016-02-04: qty 1

## 2016-02-04 MED ORDER — HALOPERIDOL LACTATE 5 MG/ML IJ SOLN
2.5000 mg | Freq: Once | INTRAMUSCULAR | Status: DC
  Filled 2016-02-04: qty 1

## 2016-02-04 MED ORDER — DICLOFENAC SODIUM 1 % TD GEL
2.0000 g | Freq: Four times a day (QID) | TRANSDERMAL | Status: DC
  Administered 2016-02-04 – 2016-02-08 (×12): 2 g via TOPICAL
  Filled 2016-02-04: qty 100

## 2016-02-04 MED ORDER — ATORVASTATIN CALCIUM 20 MG PO TABS
20.0000 mg | ORAL_TABLET | Freq: Every day | ORAL | Status: DC
  Administered 2016-02-04 – 2016-02-06 (×3): 20 mg via ORAL
  Filled 2016-02-04 (×5): qty 1

## 2016-02-04 MED ORDER — LEVOTHYROXINE SODIUM 50 MCG PO TABS
50.0000 ug | ORAL_TABLET | Freq: Every day | ORAL | Status: DC
  Administered 2016-02-05 – 2016-02-08 (×4): 50 ug via ORAL
  Filled 2016-02-04 (×5): qty 1

## 2016-02-04 MED ORDER — CHLORHEXIDINE GLUCONATE 0.12 % MT SOLN
15.0000 mL | Freq: Two times a day (BID) | OROMUCOSAL | Status: DC
  Administered 2016-02-04 – 2016-02-08 (×7): 15 mL via OROMUCOSAL
  Filled 2016-02-04 (×9): qty 15

## 2016-02-04 MED ORDER — SODIUM CHLORIDE 0.9 % IV SOLN
INTRAVENOUS | Status: AC
  Administered 2016-02-04: 16:00:00 via INTRAVENOUS

## 2016-02-04 NOTE — ED Notes (Signed)
Bed: FX58 Expected date:  Expected time:  Means of arrival:  Comments: EMS 82 y.o. Confusion

## 2016-02-04 NOTE — ED Provider Notes (Signed)
CSN: [id number]     Arrival date & time 02/04/16  4098 History   First MD Initiated Contact with Patient 02/04/16 [phone]     Chief Complaint  Patient presents with  . Altered Mental Status    PER DAUGHTER SINCE THIS AM      HPI Patient presents emergency room via EMS with altered mental status per family.  Patient awoke this morning was at [facility] hallucinating.  She is alert and oriented 2 now.  She also complains of right arm and right hip pain.  She said she vomited early this morning one time. Past Medical History  Diagnosis Date  . Diabetes mellitus   . Chronic diastolic CHF (congestive heart failure) (HCC)   . Pulmonary embolus (HCC) 03-23-11    a. 1978 and 2012.  . Arthritis     left leg  . UTI (lower urinary tract infection)   . Hypothyroidism   . Permanent atrial fibrillation (HCC)   . Glaucoma     a. with significantly decreased vision.  . Chronic atrial flutter (HCC)   . Bradycardia     a. 05/2015: HR 40s-50s, occ 30s at night - not clear how much symptoms were r/t this, patient declined PPM at that time.  . CKD (chronic kidney disease), stage III   . Mild dementia   . Moderate to severe pulmonary hypertension (HCC) By echo 05/2015  . RBBB    Past Surgical History  Procedure Laterality Date  . Kisney stone removal  1978    removed thru bladder  . Cystoscopy w/ ureteral stent placement  09/06/2011    Procedure: CYSTOSCOPY WITH RETROGRADE PYELOGRAM/URETERAL STENT PLACEMENT;  Surgeon: Valetta Fuller, MD;  Location: [facility];  Service: Urology;  Laterality: Right;  cysto double j stent placement  . Ureteroscopy  09/06/2011    Procedure: URETEROSCOPY;  Surgeon: Valetta Fuller, MD;  Location: [facility];  Service: Urology;  Laterality: Right;   Family History  Problem Relation Age of Onset  . Hypertension     Social History  Substance Use Topics  . Smoking status: Never Smoker   . Smokeless tobacco: Never Used  . Alcohol Use: No   OB History    No data available      Review of Systems  Unable to perform ROS: Mental status change      Allergies  Gemfibrozil; Iohexol; Ivp dye; Bactrim; and Sulfamethoxazole-trimethoprim  Home Medications   Prior to Admission medications   Medication Sig Start Date End Date Taking? Authorizing Provider  acetaminophen (TYLENOL) 325 MG tablet Take 2 tablets (650 mg total) by mouth every 6 (six) hours as needed for mild pain (or Fever >/= 101). 09/10/15  Yes Joseph Art, DO  aspirin 325 MG EC tablet Take 325 mg by mouth daily.   Yes Historical Provider, MD  atorvastatin (LIPITOR) 20 MG tablet Take 1 tablet (20 mg total) by mouth daily at 6 PM. 08/11/15  Yes Chrystie Nose, MD  beta carotene w/minerals (OCUVITE) tablet Take 1 tablet by mouth daily.   Yes Historical Provider, MD  donepezil (ARICEPT) 5 MG tablet Take 5 mg by mouth every morning. Prefers to take in the morning   Yes Historical Provider, MD  furosemide (LASIX) 40 MG tablet Take 1 tablet (40 mg total) by mouth 2 (two) times daily. 09/10/15  Yes Jessica U Vann, DO  insulin glargine (LANTUS) 100 UNIT/ML injection Inject 0.3 mLs (30 Units total) into the skin 2 (two) times daily. Patient taking  differently: Inject 35 Units into the skin 2 (two) times daily.  09/10/15  Yes Joseph Art, DO  levothyroxine (SYNTHROID, LEVOTHROID) 50 MCG tablet Take 50 mcg by mouth daily before breakfast.   Yes Historical Provider, MD  traMADol (ULTRAM) 50 MG tablet Take 25 mg by mouth daily.   Yes Historical Provider, MD  trimethoprim (TRIMPEX) 100 MG tablet Take 100 mg by mouth daily. 01/15/16  Yes Historical Provider, MD  warfarin (COUMADIN) 4 MG tablet Take 4 mg by mouth daily. 01/20/16  Yes Historical Provider, MD  insulin aspart (NOVOLOG) 100 UNIT/ML injection Inject 0-5 Units into the skin at bedtime. Patient not taking: Reported on 02/04/2016 09/10/15   Joseph Art, DO  insulin aspart (NOVOLOG) 100 UNIT/ML injection Inject 0-9 Units into the skin 3 (three) times daily  with meals. Patient not taking: Reported on 02/04/2016 09/10/15   Joseph Art, DO  predniSONE (DELTASONE) 10 MG tablet 60 mg x 1 day, then 50 mg x 1 days, then 40 mg x 1 days, then 30 mg x 1 days, then 20 mg x 1 day, then 10 mg x 1 days then 5 mg x 1 day then stop Patient not taking: Reported on 02/04/2016 09/10/15   Joseph Art, DO  traMADol-acetaminophen (ULTRACET) 37.5-325 MG tablet Take 1 tablet by mouth every 6 (six) hours as needed for moderate pain. Patient not taking: Reported on 02/04/2016 09/10/15   Selinda Orion Vann, DO   BP 162/83 mmHg  Pulse 71  Temp(Src) 97.7 F (36.5 C) (Oral)  Resp 19  Ht  (1.702 m)  Wt 230 lb (104.327 kg)  BMI 36.01 kg/m2  SpO2 93% Physical Exam  Constitutional: She appears well-developed and well-nourished. No distress.  HENT:  Head: Normocephalic and atraumatic.  Eyes: Pupils are equal, round, and reactive to light.  Neck: Normal range of motion.  Cardiovascular: Normal rate and intact distal pulses.   Pulmonary/Chest: No respiratory distress.  Abdominal: Soft. Normal appearance. She exhibits no distension. There is no tenderness. There is no rebound.  Musculoskeletal: She exhibits tenderness.       Right shoulder: She exhibits tenderness.       Right hip: She exhibits tenderness.  Neurological: She is alert. No cranial nerve deficit.  Skin: Skin is warm and dry. No rash noted.  Psychiatric: She has a normal mood and affect. Her speech is normal. She is actively hallucinating.  Nursing note and vitals reviewed.   ED Course  Procedures (including critical care time) Labs Review Labs Reviewed  CBC WITH DIFFERENTIAL/PLATELET - Abnormal; Notable for the following:    WBC 10.8 (*)    RBC 5.38 (*)    MCV 76.8 (*)    MCH 25.8 (*)    RDW 17.3 (*)    Neutro Abs 7.9 (*)    All other components within normal limits  URINALYSIS, ROUTINE W REFLEX MICROSCOPIC (NOT AT [facility]) - Abnormal; Notable for the following:    APPearance CLOUDY (*)     Protein, ur 30 (*)    Leukocytes, UA LARGE (*)    All other components within normal limits  PROTIME-INR - Abnormal; Notable for the following:    Prothrombin Time 15.5 (*)    All other components within normal limits  COMPREHENSIVE METABOLIC PANEL - Abnormal; Notable for the following:    Glucose, Bld 126 (*)    Creatinine, Ser 1.17 (*)    ALT 10 (*)    GFR calc non Af Amer 39 (*)  GFR calc Af Amer 46 (*)    All other components within normal limits  URINE MICROSCOPIC-ADD ON - Abnormal; Notable for the following:    Squamous Epithelial / LPF 0-5 (*)    Bacteria, UA MANY (*)    Casts HYALINE CASTS (*)    All other components within normal limits  URINE CULTURE    Imaging Review Dg Chest 1 View  02/04/2016  CLINICAL DATA:  Acute right-sided chest pain. EXAM: CHEST 1 VIEW COMPARISON:  September 08, 2015. FINDINGS: Stable cardiomegaly. No pneumothorax or significant pleural effusion is noted. Stable interstitial densities are noted throughout both lungs consistent with scarring. Bony thorax is unremarkable. IMPRESSION: Stable interstitial densities noted throughout both lungs most consistent with scarring. Superimposed edema or inflammation cannot be excluded. Electronically Signed   By: Lupita Raider, M.D.   On: 02/04/2016 10:02   Ct Head Wo Contrast  02/04/2016  CLINICAL DATA:  Altered mental status, woke up this morning talking but not making sense, altered level of consciousness, diabetes mellitus, chronic diastolic CHF, chronic atrial flutter, mild dementia, pulmonary hypertension EXAM: CT HEAD WITHOUT CONTRAST TECHNIQUE: Contiguous axial images were obtained from the base of the skull through the vertex without intravenous contrast. COMPARISON:  11/14/2006 FINDINGS: Generalized atrophy. Normal ventricular morphology. No midline shift or mass effect. Small vessel chronic ischemic changes of deep cerebral white matter progressive since previous exam. Poorly defined area of low  attenuation within LEFT thalamus new since prior exam consistent with infarct, question acute versus subacute. No intracranial hemorrhage, mass lesion, or acute cortical infarction. Visualized paranasal sinuses and mastoid air cells clear. Bones unremarkable. Minimal atherosclerotic calcification at skullbase. IMPRESSION: Atrophy with progressive small vessel chronic ischemic changes of deep cerebral white matter. Acute versus subacute infarct LEFT thalamus. Electronically Signed   By: Ulyses Southward M.D.   On: 02/04/2016 09:42   Dg Humerus Right  02/04/2016  CLINICAL DATA:  Proximal RIGHT humeral pain, no known trauma EXAM: RIGHT HUMERUS - 2+ VIEW COMPARISON:  None FINDINGS: Mild osseous demineralization. AC joint alignment normal. Glenohumeral and elbow joint alignments normal. No acute fracture, dislocation, or bone destruction. IMPRESSION: No acute abnormalities. Electronically Signed   By: Ulyses Southward M.D.   On: 02/04/2016 09:50   I have personally reviewed and evaluated these images and lab results as part of my medical decision-making.    MDM   Final diagnoses:  UTI (lower urinary tract infection)  Altered mental status, unspecified altered mental status type        Nelva Nay, MD 02/04/16 1256

## 2016-02-04 NOTE — ED Notes (Signed)
RN plans to collect blood.

## 2016-02-04 NOTE — ED Notes (Signed)
FIRST ATTEMPT TO CALL REPORT TO 1501-1.

## 2016-02-04 NOTE — H&P (Signed)
History and Physical    Sara Dennis ZOX:[id number] DOB: 09/19/1922 DOA: 02/04/2016  Referring MD/NP/PA:  PCP: Coralie Common, MD Outpatient Specialists:  Patient coming from: Community  Chief Complaint: Mental status changes  HPI: Sara Dennis is a 82 y.o. female with medical history significant of cognitive impairment, poor functional status at baseline, A. fib who had been chronically anticoagulated with warfarin, chronic diastolic congestive heart failure, history of recurrent urinary tract infections, presenting to the emergency department with mental status changes. History was obtained from family members who were present at bedside as patient is unable to provide history or participate in her plan of care. Family members reporting that she has had a steep decline over the past 24 hours, developing agitation, hallucinating, speaking with dead relatives, "talking out of her head" having minimal by mouth intake. She had episode of nausea and vomiting this morning. Family members reporting that she was awake all night long. They deny fevers, chills, dysuria, hematuria, chest pain or shortness of breath. They do report that she has been complaining of right shoulder and right knee pain. She has not had any recent falls. At baseline she has been nonambulatory since February required assistance with most activities of daily living. She has a history of cognitive impairment and has been hospitalized in the past for acute encephalopathy secondary to urinary tract infection.  ED Course: In the emergency department she was diagnosed with a urinary tract infection and started on ceftriaxone IV.  Review of Systems: Unable to obtain reliable review of systems due to acute encephalopathy  Past Medical History  Diagnosis Date  . Diabetes mellitus   . Chronic diastolic CHF (congestive heart failure) (HCC)   . Pulmonary embolus (HCC) 03-23-11    a. 1978 and 2012.  . Arthritis     left leg  . UTI  (lower urinary tract infection)   . Hypothyroidism   . Permanent atrial fibrillation (HCC)   . Glaucoma     a. with significantly decreased vision.  . Chronic atrial flutter (HCC)   . Bradycardia     a. 05/2015: HR 40s-50s, occ 30s at night - not clear how much symptoms were r/t this, patient declined PPM at that time.  . CKD (chronic kidney disease), stage III   . Mild dementia   . Moderate to severe pulmonary hypertension (HCC) By echo 05/2015  . RBBB     Past Surgical History  Procedure Laterality Date  . Kisney stone removal  1978    removed thru bladder  . Cystoscopy w/ ureteral stent placement  09/06/2011    Procedure: CYSTOSCOPY WITH RETROGRADE PYELOGRAM/URETERAL STENT PLACEMENT;  Surgeon: Valetta Fuller, MD;  Location: [facility];  Service: Urology;  Laterality: Right;  cysto double j stent placement  . Ureteroscopy  09/06/2011    Procedure: URETEROSCOPY;  Surgeon: Valetta Fuller, MD;  Location: [facility];  Service: Urology;  Laterality: Right;     reports that she has never smoked. She has never used smokeless tobacco. She reports that she does not drink alcohol or use illicit drugs.  Allergies  Allergen Reactions  . Gemfibrozil Nausea And Vomiting and Other (See Comments)    Sick and give urine infection  . Iohexol Itching  . Ivp Dye [Iodinated Diagnostic Agents] Itching    itching  . Bactrim Itching, Nausea And Vomiting and Rash    And diarrhea  . Sulfamethoxazole-Trimethoprim Itching, Nausea And Vomiting and Rash    And diarrhea    Family  History  Problem Relation Age of Onset  . Hypertension      Prior to Admission medications   Medication Sig Start Date End Date Taking? Authorizing Provider  acetaminophen (TYLENOL) 325 MG tablet Take 2 tablets (650 mg total) by mouth every 6 (six) hours as needed for mild pain (or Fever >/= 101). 09/10/15  Yes Joseph Art, DO  aspirin 325 MG EC tablet Take 325 mg by mouth daily.   Yes Historical Provider, MD  atorvastatin  (LIPITOR) 20 MG tablet Take 1 tablet (20 mg total) by mouth daily at 6 PM. 08/11/15  Yes Chrystie Nose, MD  beta carotene w/minerals (OCUVITE) tablet Take 1 tablet by mouth daily.   Yes Historical Provider, MD  donepezil (ARICEPT) 5 MG tablet Take 5 mg by mouth every morning. Prefers to take in the morning   Yes Historical Provider, MD  furosemide (LASIX) 40 MG tablet Take 1 tablet (40 mg total) by mouth 2 (two) times daily. 09/10/15  Yes Jessica U Vann, DO  insulin glargine (LANTUS) 100 UNIT/ML injection Inject 0.3 mLs (30 Units total) into the skin 2 (two) times daily. Patient taking differently: Inject 35 Units into the skin 2 (two) times daily.  09/10/15  Yes Joseph Art, DO  levothyroxine (SYNTHROID, LEVOTHROID) 50 MCG tablet Take 50 mcg by mouth daily before breakfast.   Yes Historical Provider, MD  traMADol (ULTRAM) 50 MG tablet Take 25 mg by mouth daily.   Yes Historical Provider, MD  trimethoprim (TRIMPEX) 100 MG tablet Take 100 mg by mouth daily. 01/15/16  Yes Historical Provider, MD  warfarin (COUMADIN) 4 MG tablet Take 4 mg by mouth daily. 01/20/16  Yes Historical Provider, MD  insulin aspart (NOVOLOG) 100 UNIT/ML injection Inject 0-5 Units into the skin at bedtime. Patient not taking: Reported on 02/04/2016 09/10/15   Joseph Art, DO  insulin aspart (NOVOLOG) 100 UNIT/ML injection Inject 0-9 Units into the skin 3 (three) times daily with meals. Patient not taking: Reported on 02/04/2016 09/10/15   Joseph Art, DO  predniSONE (DELTASONE) 10 MG tablet 60 mg x 1 day, then 50 mg x 1 days, then 40 mg x 1 days, then 30 mg x 1 days, then 20 mg x 1 day, then 10 mg x 1 days then 5 mg x 1 day then stop Patient not taking: Reported on 02/04/2016 09/10/15   Joseph Art, DO  traMADol-acetaminophen (ULTRACET) 37.5-325 MG tablet Take 1 tablet by mouth every 6 (six) hours as needed for moderate pain. Patient not taking: Reported on 02/04/2016 09/10/15   Joseph Art, DO    Physical  Exam: Filed Vitals:   02/04/16 0854 02/04/16 1103 02/04/16 1130 02/04/16 1230  BP:  179/99 156/70 162/83  Pulse:  70 67 71  Temp:      TempSrc:      Resp:  19    Height:  (1.702 m)     Weight: 104.327 kg (230 lb)     SpO2:  91% 95% 93%    Constitutional: She is mildly agitated, unable to provide reliable history, family members reported that she was seeing snakes in her right arm prior to my arrival Filed Vitals:   02/04/16 0854 02/04/16 1103 02/04/16 1130 02/04/16 1230  BP:  179/99 156/70 162/83  Pulse:  70 67 71  Temp:      TempSrc:      Resp:  19    Height:  (1.702 m)  Weight: 104.327 kg (230 lb)     SpO2:  91% 95% 93%   Eyes: PERRL, lids and conjunctivae normal ENMT: Dry oral mucosa, no oral lesions or masses Neck: normal, supple, no masses, no thyromegaly, no jugular venous distention Respiratory: clear to auscultation bilaterally, no wheezing, no crackles. Normal respiratory effort. No accessory muscle use.  Cardiovascular: Irregular rate and rhythm normal S1-S2, no extremity edema  Abdomen: no tenderness, no masses palpated. No hepatosplenomegaly. Bowel sounds positive.  Musculoskeletal: No edema, she reports pain with abduction of her right upper extremity mostly to her shoulder. Skin: no rashes, lesions, ulcers. No induration Neurologic: CN 2-12 grossly intact. Sensation intact, DTR normal. Strength 5/5 in all 4.  Psychiatric: She is confused, disoriented, agitated  Labs on Admission: I have personally reviewed following labs and imaging studies  CBC:  Recent Labs Lab 02/04/16 1019  WBC 10.8*  NEUTROABS 7.9*  HGB 13.9  HCT 41.3  MCV 76.8*  PLT 247   Basic Metabolic Panel:  Recent Labs Lab 02/04/16 1123  NA 143  K 4.4  CL 104  CO2 26  GLUCOSE 126*  BUN 19  CREATININE 1.17*  CALCIUM 9.9   GFR: Estimated Creatinine Clearance: 38.9 mL/min (by C-G formula based on Cr of 1.17). Liver Function Tests:  Recent Labs Lab 02/04/16 1123   AST 19  ALT 10*  ALKPHOS 56  BILITOT 0.7  PROT 7.3  ALBUMIN 4.0   No results for input(s): LIPASE, AMYLASE in the last 168 hours. No results for input(s): AMMONIA in the last 168 hours. Coagulation Profile:  Recent Labs Lab 02/04/16 1019  INR 1.22   Cardiac Enzymes: No results for input(s): CKTOTAL, CKMB, CKMBINDEX, TROPONINI in the last 168 hours. BNP (last 3 results) No results for input(s): PROBNP in the last 8760 hours. HbA1C: No results for input(s): HGBA1C in the last 72 hours. CBG: No results for input(s): GLUCAP in the last 168 hours. Lipid Profile: No results for input(s): CHOL, HDL, LDLCALC, TRIG, CHOLHDL, LDLDIRECT in the last 72 hours. Thyroid Function Tests: No results for input(s): TSH, T4TOTAL, FREET4, T3FREE, THYROIDAB in the last 72 hours. Anemia Panel: No results for input(s): VITAMINB12, FOLATE, FERRITIN, TIBC, IRON, RETICCTPCT in the last 72 hours. Urine analysis:    Component Value Date/Time   COLORURINE YELLOW 02/04/2016 1042   APPEARANCEUR CLOUDY* 02/04/2016 1042   LABSPEC 1.021 02/04/2016 1042   PHURINE 6.5 02/04/2016 1042   GLUCOSEU NEGATIVE 02/04/2016 1042   HGBUR NEGATIVE 02/04/2016 1042   BILIRUBINUR NEGATIVE 02/04/2016 1042   KETONESUR NEGATIVE 02/04/2016 1042   PROTEINUR 30* 02/04/2016 1042   UROBILINOGEN 0.2 06/13/2015 1105   NITRITE NEGATIVE 02/04/2016 1042   LEUKOCYTESUR LARGE* 02/04/2016 1042   Sepsis Labs: @LABRCNTIP (procalcitonin:4,lacticidven:4) )No results found for this or any previous visit (from the past 240 hour(s)).   Radiological Exams on Admission: Dg Chest 1 View  02/04/2016  CLINICAL DATA:  Acute right-sided chest pain. EXAM: CHEST 1 VIEW COMPARISON:  September 08, 2015. FINDINGS: Stable cardiomegaly. No pneumothorax or significant pleural effusion is noted. Stable interstitial densities are noted throughout both lungs consistent with scarring. Bony thorax is unremarkable. IMPRESSION: Stable interstitial densities  noted throughout both lungs most consistent with scarring. Superimposed edema or inflammation cannot be excluded. Electronically Signed   By: Lupita Raider, M.D.   On: 02/04/2016 10:02   Ct Head Wo Contrast  02/04/2016  CLINICAL DATA:  Altered mental status, woke up this morning talking but not making sense, altered level of consciousness,  diabetes mellitus, chronic diastolic CHF, chronic atrial flutter, mild dementia, pulmonary hypertension EXAM: CT HEAD WITHOUT CONTRAST TECHNIQUE: Contiguous axial images were obtained from the base of the skull through the vertex without intravenous contrast. COMPARISON:  11/14/2006 FINDINGS: Generalized atrophy. Normal ventricular morphology. No midline shift or mass effect. Small vessel chronic ischemic changes of deep cerebral white matter progressive since previous exam. Poorly defined area of low attenuation within LEFT thalamus new since prior exam consistent with infarct, question acute versus subacute. No intracranial hemorrhage, mass lesion, or acute cortical infarction. Visualized paranasal sinuses and mastoid air cells clear. Bones unremarkable. Minimal atherosclerotic calcification at skullbase. IMPRESSION: Atrophy with progressive small vessel chronic ischemic changes of deep cerebral white matter. Acute versus subacute infarct LEFT thalamus. Electronically Signed   By: Ulyses Southward M.D.   On: 02/04/2016 09:42   Dg Humerus Right  02/04/2016  CLINICAL DATA:  Proximal RIGHT humeral pain, no known trauma EXAM: RIGHT HUMERUS - 2+ VIEW COMPARISON:  None FINDINGS: Mild osseous demineralization. AC joint alignment normal. Glenohumeral and elbow joint alignments normal. No acute fracture, dislocation, or bone destruction. IMPRESSION: No acute abnormalities. Electronically Signed   By: Ulyses Southward M.D.   On: 02/04/2016 09:50    EKG: Independently reviewed.   Assessment/Plan Active Problems:   UTI (urinary tract infection)   Acute encephalopathy   Chronic kidney  disease stage III   Diabetes mellitus (HCC)   Atrial flutter (HCC)   Chronic diastolic (congestive) heart failure (HCC)   1.  Acute encephalopathy. Mrs. Murtis Sink is a 82 year old female presenting to the emergency room with a steep functional decline, having minimal by mouth intake, hallucinations, agitation for the past 24 hours. In the emergency department she was diagnosed with urinary tract infection. I suspect combination of urinary tract infection and dehydration likely precipitating acute delirium. Ultram may have contributed as well. Will provide empiric IV antibiotic therapy with ceftriaxone 1 g IV every 24 hours along with gentle hydration with normal saline running at 75 mL/hour x 1L. will stop Lasix for now.  Admit to MedSurg, provide supportive care.   2.  Urinary tract infection. She presents with acute encephalopathy, with urinalysis showing the presence of many bacteria and white blood cells. Previous urine cultures have grown Escherichia coli. Cultures from 09/06/2015 showing organism was susceptible to ceftriaxone. Prior to this hospitalization she had been on trimethoprim for UTI prophylaxis. Will treat with ceftriaxone 1 g IV every 24 hours, follow-up on urine cultures.  3.  Dehydration. She appears dry on physical examination likely caused by episode of nausea vomiting she had along with taking diuretic therapy and having minimal by mouth intake. Will stop Lasix, provide gentle IV fluid hydration with normal saline.  4.  History of chronic diastolic congestive heart failure. Her last transthoracic echocardiogram was performed on 06/12/2015 that showed a preserved EF of 60-65% with grade 2 diastolic dysfunction. She appears dry on physical examination as I'm concerned for dehydration can have it into mental status changes, will hold Lasix and provide gentle IV fluids. Monitor volume status closely.  5.  History of atrial fibrillation, having CHADSVasc score of 4. She is on chronic  anticoagulation with warfarin with initial lab work showing a subtherapeutic INR 1.22 with PT of 15.5. Will consult pharmacy for assistance with warfarin dosing along with Stickney Lovenox for bridging. She is currently rate controlled.  6.   Diabetes mellitus. Since she presents with acute encephalopathy, minimal by mouth intake, will hold basal insulin for now and  provide sliding scale coverage every 4 hours until she is able to tolerate by mouth.  7.  Right shoulder pain. She had an x-ray of right shoulder performed in the ER which did not show acute abnormalities. Suspect arthritis. Will provide Voltaren gel    DVT prophylaxis: She is fully anticoagulated Code Status: Spoke with family she is a full code Family Communication: Case discussed with her 2 daughters were present at bedside Disposition Plan: Will admit to the inpatient service, anticipate she'll require greater than 2 nights hospitalization Consults called:  Admission status: Will admit to MedSurg.   Jeralyn Bennett MD [facility] Pager [phone]  If 7PM-7AM, please contact night-coverage [email or web address] Password [email or web address]  02/04/2016, 1:30 PM

## 2016-02-04 NOTE — ED Notes (Signed)
PT RECEIVED FROM HOME VIA EMS FOR AMS. PER THE DAUGHTER, THE PT WOKE UP THIS MORNING TALKING, BUT NOT MAKING MUCH SINCE.  PT AAOX2. PT C/O LEFT ARM AND STOMACH PAIN. PT STATES SHE VOMITED ONCE TODAY.

## 2016-02-04 NOTE — Progress Notes (Signed)
ANTICOAGULATION CONSULT NOTE - Initial Consult  Pharmacy Consult for Warfarin/Lovenox Indication: atrial fibrillation  Allergies  Allergen Reactions  . Gemfibrozil Nausea And Vomiting and Other (See Comments)    Sick and give urine infection  . Iohexol Itching  . Ivp Dye [Iodinated Diagnostic Agents] Itching    itching  . Bactrim Itching, Nausea And Vomiting and Rash    And diarrhea  . Sulfamethoxazole-Trimethoprim Itching, Nausea And Vomiting and Rash    And diarrhea    Patient Measurements: Height: 5\' 7"  (170.2 cm) Weight: 230 lb (104.327 kg) IBW/kg (Calculated) : 61.6  Vital Signs: Temp: 97.5 F (36.4 C) (05/10 1341) Temp Source: Oral (05/10 1341) BP: 159/81 mmHg (05/10 1341) Pulse Rate: 60 (05/10 1341)  Labs:  Recent Labs  02/04/16 1019 02/04/16 1123  HGB 13.9  --   HCT 41.3  --   PLT 247  --   LABPROT 15.5*  --   INR 1.22  --   CREATININE  --  1.17*    Estimated Creatinine Clearance: 38.9 mL/min (by C-G formula based on Cr of 1.17).   Medical History: Past Medical History  Diagnosis Date  . Diabetes mellitus   . Chronic diastolic CHF (congestive heart failure) (HCC)   . Pulmonary embolus (HCC) 03-23-11    a. 1978 and 2012.  . Arthritis     left leg  . UTI (lower urinary tract infection)   . Hypothyroidism   . Permanent atrial fibrillation (HCC)   . Glaucoma     a. with significantly decreased vision.  . Chronic atrial flutter (HCC)   . Bradycardia     a. 05/2015: HR 40s-50s, occ 30s at night - not clear how much symptoms were r/t this, patient declined PPM at that time.  . CKD (chronic kidney disease), stage III   . Mild dementia   . Moderate to severe pulmonary hypertension (HCC) By echo 05/2015  . RBBB     Assessment: 89 yoF on chronic warfarin for atrial fibrillation admitted with AMS, UTI.  INR subtherapeutic on admission.  Pharmacy consulted to manage warfarin inpatient as well as Lovenox while INR is subtherapeutic.  Patient also on  aspirin 325 mg daily PTA.  Was not resumed on admission.  Unsure of indication for this in addition to warfarin in this patient or this higher dose.  Home warfarin dose reported as 4 mg daily, LD 5/9 at 1930.  Today's labs:  INR 1.22  CBC shows Hgb, plts WNL  CrCl~38 ml/min  Goal of Therapy:  INR 2-3 Monitor platelets by anticoagulation protocol: Yes   Plan:  Warfarin 6 mg once today.  Higher dose for subtherapeutic INR. Lovenox 1 mg/kg (100 mg) SQ q12h. Daily INR. Check CBC at least q72h. F/u SCr for potential dose adjustment for Lovenox.  Clance Boll 02/04/2016,2:34 PM

## 2016-02-05 ENCOUNTER — Inpatient Hospital Stay (HOSPITAL_COMMUNITY): Payer: Medicare HMO

## 2016-02-05 DIAGNOSIS — N183 Chronic kidney disease, stage 3 (moderate): Secondary | ICD-10-CM

## 2016-02-05 DIAGNOSIS — E1122 Type 2 diabetes mellitus with diabetic chronic kidney disease: Secondary | ICD-10-CM

## 2016-02-05 DIAGNOSIS — I5032 Chronic diastolic (congestive) heart failure: Secondary | ICD-10-CM

## 2016-02-05 DIAGNOSIS — I4891 Unspecified atrial fibrillation: Secondary | ICD-10-CM

## 2016-02-05 DIAGNOSIS — G9340 Encephalopathy, unspecified: Secondary | ICD-10-CM

## 2016-02-05 DIAGNOSIS — N390 Urinary tract infection, site not specified: Principal | ICD-10-CM

## 2016-02-05 LAB — GLUCOSE, CAPILLARY
GLUCOSE-CAPILLARY: 134 mg/dL — AB (ref 65–99)
GLUCOSE-CAPILLARY: 173 mg/dL — AB (ref 65–99)
GLUCOSE-CAPILLARY: 141 mg/dL — AB (ref 65–99)
Glucose-Capillary: 106 mg/dL — ABNORMAL HIGH (ref 65–99)
Glucose-Capillary: 221 mg/dL — ABNORMAL HIGH (ref 65–99)
Glucose-Capillary: 96 mg/dL (ref 65–99)

## 2016-02-05 LAB — BASIC METABOLIC PANEL
ANION GAP: 11 (ref 5–15)
BUN: 19 mg/dL (ref 6–20)
CO2: 26 mmol/L (ref 22–32)
Calcium: 9.3 mg/dL (ref 8.9–10.3)
Chloride: 102 mmol/L (ref 101–111)
Creatinine, Ser: 1.01 mg/dL — ABNORMAL HIGH (ref 0.44–1.00)
GFR calc Af Amer: 55 mL/min — ABNORMAL LOW (ref >60)
GFR, EST NON AFRICAN AMERICAN: 47 mL/min — AB (ref >60)
GLUCOSE: 108 mg/dL — AB (ref 65–99)
POTASSIUM: 4.1 mmol/L (ref 3.5–5.1)
Sodium: 139 mmol/L (ref 135–145)

## 2016-02-05 LAB — CBC
HCT: 43.4 % (ref 36.0–46.0)
HEMOGLOBIN: 14.5 g/dL (ref 12.0–15.0)
MCH: 25.5 pg — AB (ref 26.0–34.0)
MCHC: 33.4 g/dL (ref 30.0–36.0)
MCV: 76.4 fL — AB (ref 78.0–100.0)
Platelets: 280 10*3/uL (ref 150–400)
RBC: 5.68 MIL/uL — ABNORMAL HIGH (ref 3.87–5.11)
RDW: 17.2 % — AB (ref 11.5–15.5)
WBC: 11.1 10*3/uL — ABNORMAL HIGH (ref 4.0–10.5)

## 2016-02-05 LAB — PROTIME-INR
INR: 1.27 (ref 0.00–1.49)
Prothrombin Time: 16.1 seconds — ABNORMAL HIGH (ref 11.6–15.2)

## 2016-02-05 MED ORDER — TRAMADOL HCL 50 MG PO TABS
50.0000 mg | ORAL_TABLET | Freq: Four times a day (QID) | ORAL | Status: DC | PRN
  Administered 2016-02-06 (×2): 50 mg via ORAL
  Filled 2016-02-05 (×2): qty 1

## 2016-02-05 MED ORDER — WARFARIN SODIUM 6 MG PO TABS
6.0000 mg | ORAL_TABLET | Freq: Once | ORAL | Status: AC
  Administered 2016-02-05: 6 mg via ORAL
  Filled 2016-02-05: qty 1

## 2016-02-05 MED ORDER — SODIUM CHLORIDE 0.9 % IV SOLN
INTRAVENOUS | Status: AC
  Administered 2016-02-05: 11:00:00 via INTRAVENOUS
  Filled 2016-02-05: qty 1000

## 2016-02-05 MED ORDER — INSULIN ASPART 100 UNIT/ML ~~LOC~~ SOLN
0.0000 [IU] | Freq: Three times a day (TID) | SUBCUTANEOUS | Status: DC
  Administered 2016-02-05: 1 [IU] via SUBCUTANEOUS
  Administered 2016-02-05: 2 [IU] via SUBCUTANEOUS
  Administered 2016-02-06: 3 [IU] via SUBCUTANEOUS
  Administered 2016-02-06: 1 [IU] via SUBCUTANEOUS
  Administered 2016-02-06: 2 [IU] via SUBCUTANEOUS
  Administered 2016-02-07: 3 [IU] via SUBCUTANEOUS
  Administered 2016-02-07: 2 [IU] via SUBCUTANEOUS
  Administered 2016-02-08 (×2): 3 [IU] via SUBCUTANEOUS

## 2016-02-05 NOTE — Progress Notes (Signed)
ANTICOAGULATION CONSULT NOTE - Initial Consult  Pharmacy Consult for Warfarin/Lovenox Indication: atrial fibrillation  Allergies  Allergen Reactions  . Gemfibrozil Nausea And Vomiting and Other (See Comments)    Sick and give urine infection  . Iohexol Itching  . Ivp Dye [Iodinated Diagnostic Agents] Itching    itching  . Bactrim Itching, Nausea And Vomiting and Rash    And diarrhea  . Sulfamethoxazole-Trimethoprim Itching, Nausea And Vomiting and Rash    And diarrhea    Patient Measurements: Height: 5\' 7"  (170.2 cm) Weight: 230 lb (104.327 kg) IBW/kg (Calculated) : 61.6  Vital Signs: Temp: 98.4 F (36.9 C) (05/11 0429) Temp Source: Oral (05/11 0429) BP: 154/62 mmHg (05/11 0429) Pulse Rate: 69 (05/11 0429)  Labs:  Recent Labs  02/04/16 1019 02/04/16 1123 02/04/16 1440 02/05/16 0459  HGB 13.9  --  14.4 14.5  HCT 41.3  --  42.5 43.4  PLT 247  --  288 280  LABPROT 15.5*  --   --  16.1*  INR 1.22  --   --  1.27  CREATININE  --  1.17* 1.10* 1.01*    Estimated Creatinine Clearance: 45.1 mL/min (by C-G formula based on Cr of 1.01).   Medical History: Past Medical History  Diagnosis Date  . Diabetes mellitus   . Chronic diastolic CHF (congestive heart failure) (HCC)   . Pulmonary embolus (HCC) 03-23-11    a. 1978 and 2012.  . Arthritis     left leg  . UTI (lower urinary tract infection)   . Hypothyroidism   . Permanent atrial fibrillation (HCC)   . Glaucoma     a. with significantly decreased vision.  . Chronic atrial flutter (HCC)   . Bradycardia     a. 05/2015: HR 40s-50s, occ 30s at night - not clear how much symptoms were r/t this, patient declined PPM at that time.  . CKD (chronic kidney disease), stage III   . Mild dementia   . Moderate to severe pulmonary hypertension (HCC) By echo 05/2015  . RBBB     Assessment: 89 yoF on chronic warfarin for atrial fibrillation (CHADSVASC 4) admitted with AMS, UTI.  INR subtherapeutic on admission.  Pharmacy  consulted to manage warfarin inpatient as well as Lovenox while INR is subtherapeutic.  Home warfarin dose reported as 4 mg daily, LD 5/9 at 1930.  Today, 02/05/2016: - INR remains sub-therapeutic today at 1.27 with warfarin 6 mg dose given yesterday - cbc relatively stable - no bleeding documented - carb control diet: eating ~100% meals - drug-drug intxns: on abx  Goal of Therapy:  INR 2-3 Monitor platelets by anticoagulation protocol: Yes   Plan:  - repeat Warfarin 6 mg once today (1.5x home dose) - continue Lovenox 1 mg/kg (100 mg) SQ q12h -- if patient does have acute CVA, consider d/c full dose LMWH as this may increase risk for hemorrhagic transformation.  F/u with head MRI results. - Daily INR. Check CBC at least q72h. - monitor for s/s bleeding   Lucia Gaskins 02/05/2016,3:01 PM

## 2016-02-05 NOTE — Progress Notes (Signed)
PROGRESS NOTE  Sara Dennis VHQ:[id number] DOB: October 04, 1921 DOA: 02/04/2016 PCP: Coralie Common, MD  Brief History:  82 y.o. female with medical history significant of cognitive impairment, poor functional status at baseline, A. fib who had been chronically anticoagulated with warfarin, chronic diastolic congestive heart failure, history of recurrent urinary tract infections, presenting to the emergency department with mental status changes. Family members reporting that she has had a steep decline over the past 24 hours, developing agitation, hallucinating, speaking with dead relatives, "talking out of her head" having minimal by mouth intake. She had episode of nausea and vomiting 02/04/16 AM.  At baseline she has been nonambulatory since February required assistance with most activities of daily living.  Assessment/Plan: Acute encephalopathy  -Secondary to UTI and dehydration, but cannot rule out acute stroke -Continue IV ceftriaxone pending culture data -UA with TNTC WBC -MRI brain -Continue fluids  Urinary tract infection.  -02/04/2016---UA with TNTC WBC -Continue empiric ceftriaxone pending culture data -Prior to this hospitalization she had been on trimethoprim for UTI prophylaxis  Dehydration.  -appears dry clinically in the setting of nausea and vomiting -Continue IV fluids for another liter -Hold furosemide  CKD stage 3 -baseline creatinine 1.0-1.3  chronic diastolic congestive heart failure. - 06/12/2015 echo EF 60-65% with grade 2 diastolic dysfunction.  -Clinically appears dry  atrial fibrillation -CHADSVasc score of 4.  -initial lab work showing a subtherapeutic INR 1.22  -Continue warfarin with PharmD assistance -Lovenox bridge as pt has evidence of possible stroke  Diabetes mellitus type 2.  -Since she presents with acute encephalopathy, minimal by mouth intake, will hold basal insulin for now  -novolog SSI  Right shoulder pain.  -x-ray of right  shoulder performed in the ER which did not show acute abnormalities. Suspect arthritis. Will provide Voltaren gel   Disposition Plan:   Home in 2 days if stable Family Communication: No  Family at beside  Consultants:  None  Code Status:  FULL    Subjective: Patient appears more alert. No reports of hallucinations. She is oriented to place and person. Denies any headache, chest pain, shortness breath, abdominal pain, vomiting, diarrhea. Remainder review of systems unobtainable secondary to patient's cognitive impairment  Objective: Filed Vitals:   02/04/16 1439 02/04/16 2112 02/05/16 0417 02/05/16 0429  BP: 156/72 152/57 154/62 154/62  Pulse: 69 70 70 69  Temp: 97.5 F (36.4 C) 97.7 F (36.5 C) 98.4 F (36.9 C) 98.4 F (36.9 C)  TempSrc: Oral Oral Oral Oral  Resp: 18 18 18 18   Height:      Weight:      SpO2: 93% 99% 96% 97%    Intake/Output Summary (Last 24 hours) at 02/05/16 0953 Last data filed at 02/05/16 0936  Gross per 24 hour  Intake 351.25 ml  Output      0 ml  Net 351.25 ml   Weight change:  Exam:   General:  Pt is alert, follows commands appropriately, not in acute distress  HEENT: No icterus, No thrush, No neck mass, [place]/AT  Cardiovascular: RRR, S1/S2, no rubs, no gallops  Respiratory: CTA bilaterally, no wheezing, no crackles, no rhonchi  Abdomen: Soft/+BS, non tender, non distended, no guarding  Extremities: No edema, No lymphangitis, No petechiae, No rashes, no synovitis   Data Reviewed: I have personally reviewed following labs and imaging studies Basic Metabolic Panel:  Recent Labs Lab 02/04/16 1123 02/04/16 1440 02/05/16 0459  NA 143  --  139  K 4.4  --  4.1  CL 104  --  102  CO2 26  --  26  GLUCOSE 126*  --  108*  BUN 19  --  19  CREATININE 1.17* 1.10* 1.01*  CALCIUM 9.9  --  9.3   Liver Function Tests:  Recent Labs Lab 02/04/16 1123  AST 19  ALT 10*  ALKPHOS 56  BILITOT 0.7  PROT 7.3  ALBUMIN 4.0   No results for  input(s): LIPASE, AMYLASE in the last 168 hours. No results for input(s): AMMONIA in the last 168 hours. Coagulation Profile:  Recent Labs Lab 02/04/16 1019 02/05/16 0459  INR 1.22 1.27   CBC:  Recent Labs Lab 02/04/16 1019 02/04/16 1440 02/05/16 0459  WBC 10.8* 11.0* 11.1*  NEUTROABS 7.9*  --   --   HGB 13.9 14.4 14.5  HCT 41.3 42.5 43.4  MCV 76.8* 76.3* 76.4*  PLT 247 288 280   Cardiac Enzymes: No results for input(s): CKTOTAL, CKMB, CKMBINDEX, TROPONINI in the last 168 hours. BNP: Invalid input(s): POCBNP CBG:  Recent Labs Lab 02/04/16 1657 02/04/16 2110 02/05/16 0035 02/05/16 0417 02/05/16 0731  GLUCAP 133* 162* 96 106* 134*   HbA1C: No results for input(s): HGBA1C in the last 72 hours. Urine analysis:    Component Value Date/Time   COLORURINE YELLOW 02/04/2016 1042   APPEARANCEUR CLOUDY* 02/04/2016 1042   LABSPEC 1.021 02/04/2016 1042   PHURINE 6.5 02/04/2016 1042   GLUCOSEU NEGATIVE 02/04/2016 1042   HGBUR NEGATIVE 02/04/2016 1042   BILIRUBINUR NEGATIVE 02/04/2016 1042   KETONESUR NEGATIVE 02/04/2016 1042   PROTEINUR 30* 02/04/2016 1042   UROBILINOGEN 0.2 06/13/2015 1105   NITRITE NEGATIVE 02/04/2016 1042   LEUKOCYTESUR LARGE* 02/04/2016 1042   Sepsis Labs: @LABRCNTIP (procalcitonin:4,lacticidven:4) )No results found for this or any previous visit (from the past 240 hour(s)).   Scheduled Meds: . antiseptic oral rinse  7 mL Mouth Rinse q12n4p  . atorvastatin  20 mg Oral q1800  . beta carotene w/minerals  1 tablet Oral Daily  . cefTRIAXone (ROCEPHIN)  IV  1 g Intravenous Q24H  . chlorhexidine  15 mL Mouth Rinse BID  . diclofenac sodium  2 g Topical QID  . donepezil  5 mg Oral BH-q7a  . enoxaparin (LOVENOX) injection  100 mg Subcutaneous Q12H  . insulin aspart  0-15 Units Subcutaneous Q4H  . levothyroxine  50 mcg Oral QAC breakfast  . sodium chloride flush  3 mL Intravenous Q12H  . Warfarin - Pharmacist Dosing Inpatient   Does not apply q1800    Continuous Infusions:   Procedures/Studies: Dg Chest 1 View  02/04/2016  CLINICAL DATA:  Acute right-sided chest pain. EXAM: CHEST 1 VIEW COMPARISON:  September 08, 2015. FINDINGS: Stable cardiomegaly. No pneumothorax or significant pleural effusion is noted. Stable interstitial densities are noted throughout both lungs consistent with scarring. Bony thorax is unremarkable. IMPRESSION: Stable interstitial densities noted throughout both lungs most consistent with scarring. Superimposed edema or inflammation cannot be excluded. Electronically Signed   By: Lupita Raider, M.D.   On: 02/04/2016 10:02   Ct Head Wo Contrast  02/04/2016  CLINICAL DATA:  Altered mental status, woke up this morning talking but not making sense, altered level of consciousness, diabetes mellitus, chronic diastolic CHF, chronic atrial flutter, mild dementia, pulmonary hypertension EXAM: CT HEAD WITHOUT CONTRAST TECHNIQUE: Contiguous axial images were obtained from the base of the skull through the vertex without intravenous contrast. COMPARISON:  11/14/2006 FINDINGS: Generalized atrophy. Normal ventricular morphology. No midline  shift or mass effect. Small vessel chronic ischemic changes of deep cerebral white matter progressive since previous exam. Poorly defined area of low attenuation within LEFT thalamus new since prior exam consistent with infarct, question acute versus subacute. No intracranial hemorrhage, mass lesion, or acute cortical infarction. Visualized paranasal sinuses and mastoid air cells clear. Bones unremarkable. Minimal atherosclerotic calcification at skullbase. IMPRESSION: Atrophy with progressive small vessel chronic ischemic changes of deep cerebral white matter. Acute versus subacute infarct LEFT thalamus. Electronically Signed   By: Ulyses Southward M.D.   On: 02/04/2016 09:42   Dg Humerus Right  02/04/2016  CLINICAL DATA:  Proximal RIGHT humeral pain, no known trauma EXAM: RIGHT HUMERUS - 2+ VIEW COMPARISON:   None FINDINGS: Mild osseous demineralization. AC joint alignment normal. Glenohumeral and elbow joint alignments normal. No acute fracture, dislocation, or bone destruction. IMPRESSION: No acute abnormalities. Electronically Signed   By: Ulyses Southward M.D.   On: 02/04/2016 09:50    Berry Gallacher, DO  [facility] Pager [phone]  If 7PM-7AM, please contact night-coverage [email or web address] Password [email or web address] 02/05/2016, 9:53 AM   LOS: 1 day

## 2016-02-06 LAB — BASIC METABOLIC PANEL
Anion gap: 9 (ref 5–15)
BUN: 17 mg/dL (ref 6–20)
CALCIUM: 8.8 mg/dL — AB (ref 8.9–10.3)
CHLORIDE: 107 mmol/L (ref 101–111)
CO2: 24 mmol/L (ref 22–32)
CREATININE: 0.9 mg/dL (ref 0.44–1.00)
GFR, EST NON AFRICAN AMERICAN: 54 mL/min — AB (ref >60)
Glucose, Bld: 151 mg/dL — ABNORMAL HIGH (ref 65–99)
Potassium: 4.3 mmol/L (ref 3.5–5.1)
SODIUM: 140 mmol/L (ref 135–145)

## 2016-02-06 LAB — GLUCOSE, CAPILLARY
GLUCOSE-CAPILLARY: 147 mg/dL — AB (ref 65–99)
GLUCOSE-CAPILLARY: 175 mg/dL — AB (ref 65–99)
Glucose-Capillary: 207 mg/dL — ABNORMAL HIGH (ref 65–99)
Glucose-Capillary: 185 mg/dL — ABNORMAL HIGH (ref 65–99)

## 2016-02-06 LAB — PROTIME-INR
INR: 1.4 (ref 0.00–1.49)
PROTHROMBIN TIME: 17.2 s — AB (ref 11.6–15.2)

## 2016-02-06 LAB — CBC
HCT: 39.2 % (ref 36.0–46.0)
Hemoglobin: 13 g/dL (ref 12.0–15.0)
MCH: 25.4 pg — ABNORMAL LOW (ref 26.0–34.0)
MCHC: 33.2 g/dL (ref 30.0–36.0)
MCV: 76.6 fL — AB (ref 78.0–100.0)
PLATELETS: 276 10*3/uL (ref 150–400)
RBC: 5.12 MIL/uL — ABNORMAL HIGH (ref 3.87–5.11)
RDW: 17.2 % — AB (ref 11.5–15.5)
WBC: 9.1 10*3/uL (ref 4.0–10.5)

## 2016-02-06 LAB — URINE CULTURE: Culture: >=100000 — AB

## 2016-02-06 MED ORDER — SODIUM CHLORIDE 0.9 % IV SOLN
2.0000 g | Freq: Four times a day (QID) | INTRAVENOUS | Status: DC
  Administered 2016-02-06 – 2016-02-07 (×4): 2 g via INTRAVENOUS
  Filled 2016-02-06 (×5): qty 2000

## 2016-02-06 MED ORDER — WARFARIN SODIUM 6 MG PO TABS
6.0000 mg | ORAL_TABLET | Freq: Once | ORAL | Status: AC
  Administered 2016-02-06: 6 mg via ORAL
  Filled 2016-02-06 (×2): qty 1

## 2016-02-06 NOTE — Progress Notes (Signed)
PROGRESS NOTE  Sara Dennis BXI:[id number] DOB: July 10, 1922 DOA: 02/04/2016 PCP: Coralie Common, MD  Brief History:  82 y.o. female with medical history significant of cognitive impairment, poor functional status at baseline, A. fib who had been chronically anticoagulated with warfarin, chronic diastolic congestive heart failure, history of recurrent urinary tract infections, presenting to the emergency department with mental status changes. Family members reporting that she has had a steep decline over the past 24 hours, developing agitation, hallucinating, speaking with dead relatives, "talking out of her head" having minimal by mouth intake. She had episode of nausea and vomiting 02/04/16 AM. At baseline she has been nonambulatory since February required assistance with most activities of daily living.  Assessment/Plan: Acute Metabolic encephalopathy  -Secondary to UTI and dehydration -UA with TNTC WBC -MRI brain--neg -Continue fluids -improving  Urinary tract infection-Aerococcus -02/04/2016---UA with TNTC WBC --d/c ceftriaxone, start ampicillin -Prior to this hospitalization she had been on trimethoprim for UTI prophylaxis  Dehydration.  -appears dry clinically in the setting of nausea and vomiting -improved with IVF -Hold furosemide  CKD stage 3 -baseline creatinine 1.0-1.3  chronic diastolic congestive heart failure. - 06/12/2015 echo EF 60-65% with grade 2 diastolic dysfunction.  -stable  atrial fibrillation -CHADSVasc score of 4.  -initial lab work showing a subtherapeutic INR 1.22  -Continue warfarin with PharmD assistance -Lovenox bridge as pt has evidence of possible stroke  Diabetes mellitus type 2.  -CBGs controlled -novolog SSI  Right shoulder pain.  -x-ray of right shoulder performed in the ER which did not show acute abnormalities. Suspect arthritis. Will provide Voltaren gel -Neck xrays--neck for fracture or subluxation   Disposition  Plan: Home in 5/13 if stable Family Communication: No Family at beside  Consultants: None  Code Status: FULL  Subjective: Patient denies fevers, chills, headache, chest pain, dyspnea, nausea, vomiting, diarrhea, abdominal pain, dysuria, hematuria   Objective: Filed Vitals:   02/05/16 1540 02/05/16 2200 02/06/16 0600 02/06/16 1540  BP: 140/69 154/67 172/70 147/65  Pulse: 58 62 69 68  Temp: 97.8 F (36.6 C) 98.2 F (36.8 C) 98.9 F (37.2 C) 97.9 F (36.6 C)  TempSrc: Oral Oral Oral Oral  Resp: 18 20 22 20   Height:      Weight:      SpO2: 99% 97% 97% 98%    Intake/Output Summary (Last 24 hours) at 02/06/16 1556 Last data filed at 02/06/16 1300  Gross per 24 hour  Intake   1010 ml  Output      0 ml  Net   1010 ml   Weight change:  Exam:   General:  Pt is alert, follows commands appropriately, not in acute distress  HEENT: No icterus, No thrush, No neck mass, [employer]/AT  Cardiovascular: RRR, S1/S2, no rubs, no gallops  Respiratory: CTA bilaterally, no wheezing, no crackles, no rhonchi  Abdomen: Soft/+BS, non tender, non distended, no guarding  Extremities:  edema, No lymphangitis, No petechiae, No rashes, no synovitis   Data Reviewed: I have personally reviewed following labs and imaging studies Basic Metabolic Panel:  Recent Labs Lab 02/04/16 1123 02/04/16 1440 02/05/16 0459 02/06/16 0510  NA 143  --  139 140  K 4.4  --  4.1 4.3  CL 104  --  102 107  CO2 26  --  26 24  GLUCOSE 126*  --  108* 151*  BUN 19  --  19 17  CREATININE 1.17* 1.10* 1.01* 0.90  CALCIUM 9.9  --  9.3 8.8*   Liver Function Tests:  Recent Labs Lab 02/04/16 1123  AST 19  ALT 10*  ALKPHOS 56  BILITOT 0.7  PROT 7.3  ALBUMIN 4.0   No results for input(s): LIPASE, AMYLASE in the last 168 hours. No results for input(s): AMMONIA in the last 168 hours. Coagulation Profile:  Recent Labs Lab 02/04/16 1019 02/05/16 0459 02/06/16 0510  INR 1.22 1.27 1.40   CBC:  Recent  Labs Lab 02/04/16 1019 02/04/16 1440 02/05/16 0459 02/06/16 0510  WBC 10.8* 11.0* 11.1* 9.1  NEUTROABS 7.9*  --   --   --   HGB 13.9 14.4 14.5 13.0  HCT 41.3 42.5 43.4 39.2  MCV 76.8* 76.3* 76.4* 76.6*  PLT 247 288 280 276   Cardiac Enzymes: No results for input(s): CKTOTAL, CKMB, CKMBINDEX, TROPONINI in the last 168 hours. BNP: Invalid input(s): POCBNP CBG:  Recent Labs Lab 02/05/16 1222 02/05/16 1712 02/05/16 2215 02/06/16 0724 02/06/16 1148  GLUCAP 173* 141* 221* 147* 207*   HbA1C: No results for input(s): HGBA1C in the last 72 hours. Urine analysis:    Component Value Date/Time   COLORURINE YELLOW 02/04/2016 1042   APPEARANCEUR CLOUDY* 02/04/2016 1042   LABSPEC 1.021 02/04/2016 1042   PHURINE 6.5 02/04/2016 1042   GLUCOSEU NEGATIVE 02/04/2016 1042   HGBUR NEGATIVE 02/04/2016 1042   BILIRUBINUR NEGATIVE 02/04/2016 1042   KETONESUR NEGATIVE 02/04/2016 1042   PROTEINUR 30* 02/04/2016 1042   UROBILINOGEN 0.2 06/13/2015 1105   NITRITE NEGATIVE 02/04/2016 1042   LEUKOCYTESUR LARGE* 02/04/2016 1042   Sepsis Labs: @LABRCNTIP (procalcitonin:4,lacticidven:4) ) Recent Results (from the past 240 hour(s))  Urine culture     Status: Abnormal   Collection Time: 02/04/16 10:42 AM  Result Value Ref Range Status   Specimen Description URINE, CATHETERIZED  Final   Special Requests NONE  Final   Culture (A)  Final    >=100,000 COLONIES/mL AEROCOCCUS URINAE Standardized susceptibility testing for this organism is not available. Performed at [facility]    Report Status 02/06/2016 FINAL  Final     Scheduled Meds: . ampicillin (OMNIPEN) IV  2 g Intravenous Q6H  . antiseptic oral rinse  7 mL Mouth Rinse q12n4p  . atorvastatin  20 mg Oral q1800  . beta carotene w/minerals  1 tablet Oral Daily  . chlorhexidine  15 mL Mouth Rinse BID  . diclofenac sodium  2 g Topical QID  . donepezil  5 mg Oral BH-q7a  . enoxaparin (LOVENOX) injection  100 mg Subcutaneous Q12H    . insulin aspart  0-9 Units Subcutaneous TID WC  . levothyroxine  50 mcg Oral QAC breakfast  . sodium chloride flush  3 mL Intravenous Q12H  . warfarin  6 mg Oral ONCE-1800  . Warfarin - Pharmacist Dosing Inpatient   Does not apply q1800   Continuous Infusions:   Procedures/Studies: Dg Chest 1 View  02/04/2016  CLINICAL DATA:  Acute right-sided chest pain. EXAM: CHEST 1 VIEW COMPARISON:  September 08, 2015. FINDINGS: Stable cardiomegaly. No pneumothorax or significant pleural effusion is noted. Stable interstitial densities are noted throughout both lungs consistent with scarring. Bony thorax is unremarkable. IMPRESSION: Stable interstitial densities noted throughout both lungs most consistent with scarring. Superimposed edema or inflammation cannot be excluded. Electronically Signed   By: Lupita Raider, M.D.   On: 02/04/2016 10:02   Dg Cervical Spine 2 Or 3 Views  02/05/2016  CLINICAL DATA:  Neck pain.  No known injury EXAM: CERVICAL SPINE - 2-3 VIEW  COMPARISON:  None. FINDINGS: Frontal, lateral, and open-mouth odontoid images were obtained. There is no demonstrable fracture or spondylolisthesis. The prevertebral soft tissues and predental space regions are normal. There is mild disc space narrowing at C4-5, C5-6, and C6-7. No erosive change. IMPRESSION: Areas of osteoarthritic change. No demonstrable fracture or spondylolisthesis. Electronically Signed   By: Bretta Bang III M.D.   On: 02/05/2016 16:16   Ct Head Wo Contrast  02/04/2016  CLINICAL DATA:  Altered mental status, woke up this morning talking but not making sense, altered level of consciousness, diabetes mellitus, chronic diastolic CHF, chronic atrial flutter, mild dementia, pulmonary hypertension EXAM: CT HEAD WITHOUT CONTRAST TECHNIQUE: Contiguous axial images were obtained from the base of the skull through the vertex without intravenous contrast. COMPARISON:  11/14/2006 FINDINGS: Generalized atrophy. Normal ventricular  morphology. No midline shift or mass effect. Small vessel chronic ischemic changes of deep cerebral white matter progressive since previous exam. Poorly defined area of low attenuation within LEFT thalamus new since prior exam consistent with infarct, question acute versus subacute. No intracranial hemorrhage, mass lesion, or acute cortical infarction. Visualized paranasal sinuses and mastoid air cells clear. Bones unremarkable. Minimal atherosclerotic calcification at skullbase. IMPRESSION: Atrophy with progressive small vessel chronic ischemic changes of deep cerebral white matter. Acute versus subacute infarct LEFT thalamus. Electronically Signed   By: Ulyses Southward M.D.   On: 02/04/2016 09:42   Mr Brain Wo Contrast  02/05/2016  CLINICAL DATA:  82 year old female with altered mental status. Personal history of cognitive impairment at baseline. Initial encounter. EXAM: MRI HEAD WITHOUT CONTRAST TECHNIQUE: Multiplanar, multiecho pulse sequences of the brain and surrounding structures were obtained without intravenous contrast. COMPARISON:  Head CTs without contrast 02/04/2016 and earlier. FINDINGS: No restricted diffusion to suggest acute infarction. No midline shift, mass effect, evidence of mass lesion, ventriculomegaly, extra-axial collection or acute intracranial hemorrhage. Cervicomedullary junction and pituitary are within normal limits. Major intracranial vascular flow voids are within normal limits. Patchy and confluent bilateral cerebral white matter T2 and FLAIR hyperintensity in a nonspecific configuration. No associated cortical encephalomalacia or definite chronic cerebral blood products. Mild for age T2 heterogeneity in the deep gray matter nuclei, suspect a chronic lacunar infarct of the left thalamus. Mild for age T2 hyperintensity in the pons. Cerebellum within normal limits. Visible internal auditory structures appear normal. Mastoids are clear. Paranasal sinuses are clear. Postoperative changes  to both globes. Negative scalp soft tissues. Negative visualized cervical spine aside from ligamentous hypertrophy about the odontoid. Normal bone marrow signal. IMPRESSION: 1.  No acute intracranial abnormality. 2. Moderately advanced but nonspecific signal changes in the brain, favor chronic small vessel disease related. Electronically Signed   By: Odessa Fleming M.D.   On: 02/05/2016 15:29   Dg Humerus Right  02/04/2016  CLINICAL DATA:  Proximal RIGHT humeral pain, no known trauma EXAM: RIGHT HUMERUS - 2+ VIEW COMPARISON:  None FINDINGS: Mild osseous demineralization. AC joint alignment normal. Glenohumeral and elbow joint alignments normal. No acute fracture, dislocation, or bone destruction. IMPRESSION: No acute abnormalities. Electronically Signed   By: Ulyses Southward M.D.   On: 02/04/2016 09:50    Nita Whitmire, DO  [facility] Pager [phone]  If 7PM-7AM, please contact night-coverage [email or web address] Password [email or web address] 02/06/2016, 3:56 PM   LOS: 2 days

## 2016-02-06 NOTE — Progress Notes (Addendum)
ANTICOAGULATION CONSULT NOTE - Initial Consult  Pharmacy Consult for Warfarin/Lovenox Indication: atrial fibrillation  Allergies  Allergen Reactions  . Gemfibrozil Nausea And Vomiting and Other (See Comments)    Sick and give urine infection  . Iohexol Itching  . Ivp Dye [Iodinated Diagnostic Agents] Itching    itching  . Bactrim Itching, Nausea And Vomiting and Rash    And diarrhea  . Sulfamethoxazole-Trimethoprim Itching, Nausea And Vomiting and Rash    And diarrhea    Patient Measurements: Height: 5\' 7"  (170.2 cm) Weight: 230 lb (104.327 kg) IBW/kg (Calculated) : 61.6  Vital Signs: Temp: 98.9 F (37.2 C) (05/12 0600) Temp Source: Oral (05/12 0600) BP: 172/70 mmHg (05/12 0600) Pulse Rate: 69 (05/12 0600)  Labs:  Recent Labs  02/04/16 1019  02/04/16 1440 02/05/16 0459 02/06/16 0510  HGB 13.9  --  14.4 14.5 13.0  HCT 41.3  --  42.5 43.4 39.2  PLT 247  --  288 280 276  LABPROT 15.5*  --   --  16.1* 17.2*  INR 1.22  --   --  1.27 1.40  CREATININE  --   < > 1.10* 1.01* 0.90  < > = values in this interval not displayed.  Estimated Creatinine Clearance: 50.6 mL/min (by C-G formula based on Cr of 0.9).   Medical History: Past Medical History  Diagnosis Date  . Diabetes mellitus   . Chronic diastolic CHF (congestive heart failure) (HCC)   . Pulmonary embolus (HCC) 03-23-11    a. 1978 and 2012.  . Arthritis     left leg  . UTI (lower urinary tract infection)   . Hypothyroidism   . Permanent atrial fibrillation (HCC)   . Glaucoma     a. with significantly decreased vision.  . Chronic atrial flutter (HCC)   . Bradycardia     a. 05/2015: HR 40s-50s, occ 30s at night - not clear how much symptoms were r/t this, patient declined PPM at that time.  . CKD (chronic kidney disease), stage III   . Mild dementia   . Moderate to severe pulmonary hypertension (HCC) By echo 05/2015  . RBBB     Assessment: 89 yoF on chronic warfarin for atrial fibrillation (CHADSVASC 4)  admitted with AMS, UTI.  INR subtherapeutic on admission.  Pharmacy consulted to manage warfarin inpatient as well as Lovenox while INR is subtherapeutic.  Home warfarin dose reported as 4 mg daily, LD 5/9 at 1930.  Today, 02/06/2016: - INR remains sub-therapeutic today (5/12) at 1.40 up from 1.27 yesterday. Warfarin 6 mg dose given on 5/10 and 5/11. - cbc relatively stable - no bleeding documented - carb control diet: eating ~100% meals - drug-drug intxns: on abx - MRI head on 5/11 showed no signs of acute infarct  Goal of Therapy:  INR 2-3 Monitor platelets by anticoagulation protocol: Yes   Plan:  - Repeat Warfarin 6 mg once today (1.5x home dose)  - if INR does not trend up more quickly on 5/13, consider increasing warfarin   dose x 1 5/13 - Continue Lovenox 1 mg/kg (100 mg) SQ q12   - Daily INR. Check CBC at least q72h. - Monitor for s/s bleeding   Lianne Cure 02/06/2016,8:07 AM   Agree with above plan.  Juliette Alcide, PharmD, BCPS.   Pager: [phone] 02/06/2016 11:38 AM

## 2016-02-06 NOTE — Evaluation (Signed)
Physical Therapy Evaluation Patient Details Name: Sara Dennis MRN: [id number] DOB: [birth year] Today's Date: 02/06/2016   History of Present Illness  82 y.o. female with medical history significant of cognitive impairment, poor functional status at baseline, A. fib who had been chronically anticoagulated with warfarin, glaucoma, chronic diastolic congestive heart failure, history of recurrent urinary tract infections, nondisplaced malleolar fracture (Dec 2016) and admitted with acute encephalopathy due to UTI and dehydration  Clinical Impression  Pt admitted with above diagnosis. Pt currently with functional limitations due to the deficits listed below (see PT Problem List).  Pt will benefit from skilled PT to increase their independence and safety with mobility to allow discharge to the venue listed below.   Pt reports she is able to sit EOB and eat her meals at baseline.  Pt requires +2 assist for [facility] transfers and also reports having transport chair.  No family present and pt does have hx of mild dementia.  Pt reports she was not receiving physical therapy prior to arrival and also reports she believes cost was an issue in the past.     Follow Up Recommendations Home health PT    Equipment Recommendations  None recommended by PT    Recommendations for Other Services       Precautions / Restrictions Restrictions Weight Bearing Restrictions: No      Mobility  Bed Mobility Overal bed mobility: Needs Assistance Bed Mobility: Supine to Sit;Sit to Supine     Supine to sit: Min assist Sit to supine: Min assist   General bed mobility comments: slight assist to get to EOB, assist to reposition once back in bed  Transfers                 General transfer comment: pt declined due to increased bil knee pain  Ambulation/Gait                Stairs            Wheelchair Mobility    Modified Rankin (Stroke Patients Only)       Balance Overall balance  assessment: Needs assistance Sitting-balance support: Bilateral upper extremity supported;Feet supported Sitting balance-Leahy Scale: Poor Sitting balance - Comments: requires UE support                                     Pertinent Vitals/Pain Pain Assessment: 0-10 Pain Score: 10-Worst pain ever Pain Location: bil knees (L worse than R) Pain Descriptors / Indicators: Aching;Sore Pain Intervention(s): Limited activity within patient's tolerance;Monitored during session;Repositioned (declined asking RN for meds)    Home Living Family/patient expects to be discharged to:: Private residence Living Arrangements: Other relatives (daughter) Available Help at Discharge: Available 24 hours/day;Family Type of Home: Mobile home Home Access: Stairs to enter Entrance Stairs-Rails: Right Entrance Stairs-Number of Steps: 4 Home Layout: One level Home Equipment: Cane - single point;Wheelchair - Economist - 2 wheels;Transport chair;Bedside commode Additional Comments: info from previous documenation in [employer]    Prior Function Level of Independence: Needs assistance   Gait / Transfers Assistance Needed: pt reports she is able to sit EOB however requires assist for any transfers (+2) due to weakness in her leg as well as painful knees           Hand Dominance        Extremity/Trunk Assessment  Lower Extremity Assessment: Generalized weakness;RLE deficits/detail;LLE deficits/detail RLE Deficits / Details: LE muscle atrophy observed, pt reports knee pain limits mobility at baseline LLE Deficits / Details: LE muscle atrophy observed, pt reports knee pain limits mobility at baseline     Communication   Communication: No difficulties  Cognition Arousal/Alertness: Awake/alert Behavior During Therapy: WFL for tasks assessed/performed Overall Cognitive Status: No family/caregiver present to determine baseline cognitive functioning Area of  Impairment: Memory               General Comments: hx of mild dementia, pt answers appropriately, did not recall ankle fx last year    General Comments      Exercises        Assessment/Plan    PT Assessment Patient needs continued PT services  PT Diagnosis Generalized weakness   PT Problem List Decreased strength;Decreased activity tolerance;Decreased mobility;Pain  PT Treatment Interventions DME instruction;Functional mobility training;Patient/family education;Therapeutic activities;Therapeutic exercise;Wheelchair mobility training;Balance training   PT Goals (Current goals can be found in the Care Plan section) Acute Rehab PT Goals PT Goal Formulation: With patient Time For Goal Achievement: 02/20/16 Potential to Achieve Goals: Good    Frequency Min 3X/week   Barriers to discharge        Co-evaluation               End of Session   Activity Tolerance: Patient limited by pain Patient left: with call bell/phone within reach;in bed;with bed alarm set           Time: 1056-1109 PT Time Calculation (min) (ACUTE ONLY): 13 min   Charges:   PT Evaluation $PT Eval Low Complexity: 1 Procedure     PT G Codes:        Gwendolynn Merkey,KATHrine E 02/06/2016, 12:15 PM Zenovia Jarred, PT, DPT 02/06/2016 Pager: [phone]

## 2016-02-06 NOTE — Clinical Documentation Improvement (Signed)
Hospitalist  Would you please clarify type of encephalopathy?   Metabolic  Toxic  Hypertensive  Other  Clinically Undetermined  Supporting Information: Diagnosed with encephalopathy and hallucinations   Please exercise your independent, professional judgment when responding. A specific answer is not anticipated or expected.   Thank Modesta Messing [facility] Health Information Management [place] [phone]

## 2016-02-07 LAB — GLUCOSE, CAPILLARY
GLUCOSE-CAPILLARY: 215 mg/dL — AB (ref 65–99)
GLUCOSE-CAPILLARY: 173 mg/dL — AB (ref 65–99)
Glucose-Capillary: 198 mg/dL — ABNORMAL HIGH (ref 65–99)

## 2016-02-07 LAB — PROTIME-INR
INR: 1.34 (ref 0.00–1.49)
Prothrombin Time: 16.7 seconds — ABNORMAL HIGH (ref 11.6–15.2)

## 2016-02-07 LAB — COMPREHENSIVE METABOLIC PANEL
ALK PHOS: 51 U/L (ref 38–126)
ALT: 12 U/L — ABNORMAL LOW (ref 14–54)
ANION GAP: 10 (ref 5–15)
AST: 21 U/L (ref 15–41)
Albumin: 3.6 g/dL (ref 3.5–5.0)
BILIRUBIN TOTAL: 0.8 mg/dL (ref 0.3–1.2)
BUN: 14 mg/dL (ref 6–20)
CHLORIDE: 107 mmol/L (ref 101–111)
CO2: 23 mmol/L (ref 22–32)
CREATININE: 0.88 mg/dL (ref 0.44–1.00)
Calcium: 9.1 mg/dL (ref 8.9–10.3)
GFR calc Af Amer: >60 mL/min (ref >60)
GFR, EST NON AFRICAN AMERICAN: 56 mL/min — AB (ref >60)
Glucose, Bld: 169 mg/dL — ABNORMAL HIGH (ref 65–99)
POTASSIUM: 4 mmol/L (ref 3.5–5.1)
Sodium: 140 mmol/L (ref 135–145)
Total Protein: 7 g/dL (ref 6.5–8.1)

## 2016-02-07 LAB — HEMOGLOBIN A1C
HEMOGLOBIN A1C: 7.3 % — AB (ref 4.8–5.6)
MEAN PLASMA GLUCOSE: 163 mg/dL

## 2016-02-07 LAB — CBC
HEMATOCRIT: 40.7 % (ref 36.0–46.0)
HEMOGLOBIN: 13.6 g/dL (ref 12.0–15.0)
MCH: 25.8 pg — ABNORMAL LOW (ref 26.0–34.0)
MCHC: 33.4 g/dL (ref 30.0–36.0)
MCV: 77.1 fL — AB (ref 78.0–100.0)
PLATELETS: 242 10*3/uL (ref 150–400)
RBC: 5.28 MIL/uL — AB (ref 3.87–5.11)
RDW: 17.1 % — ABNORMAL HIGH (ref 11.5–15.5)
WBC: 10.7 10*3/uL — AB (ref 4.0–10.5)

## 2016-02-07 LAB — VITAMIN B12: Vitamin B-12: 316 pg/mL (ref 180–914)

## 2016-02-07 LAB — AMMONIA: Ammonia: 23 umol/L (ref 9–35)

## 2016-02-07 MED ORDER — WARFARIN SODIUM 4 MG PO TABS
8.0000 mg | ORAL_TABLET | Freq: Once | ORAL | Status: AC
  Administered 2016-02-07: 8 mg via ORAL
  Filled 2016-02-07: qty 2

## 2016-02-07 MED ORDER — HALOPERIDOL LACTATE 5 MG/ML IJ SOLN
2.0000 mg | Freq: Once | INTRAMUSCULAR | Status: AC
  Administered 2016-02-07: 2 mg via INTRAVENOUS

## 2016-02-07 MED ORDER — HALOPERIDOL LACTATE 5 MG/ML IJ SOLN
INTRAMUSCULAR | Status: AC
  Administered 2016-02-07: 08:00:00
  Filled 2016-02-07: qty 1

## 2016-02-07 MED ORDER — HALOPERIDOL LACTATE 5 MG/ML IJ SOLN: 5.0000 mg | Freq: Four times a day (QID) | INTRAMUSCULAR | Status: DC | PRN

## 2016-02-07 MED ORDER — CYANOCOBALAMIN 500 MCG PO TABS
500.0000 ug | ORAL_TABLET | Freq: Every day | ORAL | Status: DC
  Administered 2016-02-08: 500 ug via ORAL
  Filled 2016-02-07 (×2): qty 1

## 2016-02-07 MED ORDER — DEXTROSE 5 % IV SOLN
1.0000 g | INTRAVENOUS | Status: DC
  Administered 2016-02-07 – 2016-02-08 (×2): 1 g via INTRAVENOUS
  Filled 2016-02-07 (×2): qty 10

## 2016-02-07 MED ORDER — ASPIRIN 81 MG PO CHEW: 81.0000 mg | CHEWABLE_TABLET | Freq: Every day | ORAL | Status: DC

## 2016-02-07 MED ORDER — HALOPERIDOL LACTATE 5 MG/ML IJ SOLN
INTRAMUSCULAR | Status: AC
  Filled 2016-02-07: qty 1

## 2016-02-07 NOTE — Progress Notes (Signed)
Patient physically and verbally abusive.  Notified physician.  Patient disoriented and does not follow directions.  Vital signs being obtained.  Patient refusing.

## 2016-02-07 NOTE — Progress Notes (Signed)
Notified patient daughter, Annice Pih, that patient had to be put in restraints.

## 2016-02-07 NOTE — Progress Notes (Signed)
ANTICOAGULATION CONSULT NOTE - Follow up consult  Pharmacy Consult for Warfarin, Lovenox Indication: atrial fibrillation  Allergies  Allergen Reactions  . Gemfibrozil Nausea And Vomiting and Other (See Comments)    Sick and give urine infection  . Iohexol Itching  . Ivp Dye [Iodinated Diagnostic Agents] Itching    itching  . Bactrim Itching, Nausea And Vomiting and Rash    And diarrhea  . Sulfamethoxazole-Trimethoprim Itching, Nausea And Vomiting and Rash    And diarrhea    Patient Measurements: Height: 5\' 7"  (170.2 cm) Weight: 230 lb (104.327 kg) IBW/kg (Calculated) : 61.6  Vital Signs: Temp: 97.8 F (36.6 C) (05/13 0406) Temp Source: Oral (05/13 0406) BP: 152/74 mmHg (05/13 0815) Pulse Rate: 64 (05/13 0815)  Labs:  Recent Labs  02/04/16 1019  02/04/16 1440 02/05/16 0459 02/06/16 0510 02/07/16 0857  HGB 13.9  --  14.4 14.5 13.0 13.6  HCT 41.3  --  42.5 43.4 39.2 40.7  PLT 247  --  288 280 276 242  LABPROT 15.5*  --   --  16.1* 17.2*  --   INR 1.22  --   --  1.27 1.40  --   CREATININE  --   < > 1.10* 1.01* 0.90  --   < > = values in this interval not displayed.  Estimated Creatinine Clearance: 50.6 mL/min (by C-G formula based on Cr of 0.9).   Assessment: 89 yoF on chronic warfarin for atrial fibrillation (CHADSVASC 4) admitted with AMS, UTI.  INR subtherapeutic on admission.  Pharmacy consulted to manage warfarin inpatient as well as Lovenox while INR is subtherapeutic d/t stroke symptoms. Home warfarin dose reported as 4 mg daily, LD 5/9 at 1930.  Today, 02/07/2016: - INR 1.34, remains sub-therapeutic and decreased today despite boosted doses x3. - CBC:  Hgb and Plt stable/WNL - No bleeding documented - Carb control diet: eating ~100% meals - Drug-drug intxns: ampicillin may prolong INR - MRI head on 5/11 showed no signs of acute infarct  Goal of Therapy:  INR 2-3 Monitor platelets by anticoagulation protocol: Yes   Plan:  - Warfarin 8 mg once today  at 1200 - Continue Lovenox 1 mg/kg (100 mg) SQ q12   - Daily INR. Check CBC at least q72h. - Monitor for s/s bleeding   Lynann Beaver PharmD, BCPS Pager [phone] 02/07/2016 9:23 AM

## 2016-02-07 NOTE — Progress Notes (Signed)
PROGRESS NOTE  Sara Dennis GYJ:[id number] DOB: Apr 06, 1922 DOA: 02/04/2016 PCP: Coralie Common, MD Brief History:  82 y.o. female with medical history significant of cognitive impairment, poor functional status at baseline, A. fib who had been chronically anticoagulated with warfarin, chronic diastolic congestive heart failure, history of recurrent urinary tract infections, presenting to the emergency department with mental status changes. Family members reporting that she has had a steep decline over the past 24 hours, developing agitation, hallucinating, speaking with dead relatives, "talking out of her head" having minimal by mouth intake. She had episode of nausea and vomiting 02/04/16 AM. At baseline she has been nonambulatory since February required assistance with most activities of daily living.  Assessment/Plan: Acute Metabolic encephalopathy  -Secondary to UTI and dehydration -UA with TNTC WBC -MRI brain--neg -Continue fluids -02/07/16--Early AM became agitated and verbally abusive--Haldol 63m IV x 2-->calmed -Recheck CBC, BMP  Urinary tract infection-Aerococcus -02/04/2016---UA with TNTC WBC -02/06/2016 d/c ceftriaxone, start ampicillin -02/07/2016-continue ampicillin pending morning labs -Prior to this hospitalization she had been on trimethoprim for UTI prophylaxis  Dehydration.  -appears dry clinically in the setting of nausea and vomiting -improved with IVF -Hold furosemide  CKD stage 3 -baseline creatinine 1.0-1.3  chronic diastolic congestive heart failure. - 06/12/2015 echo EF 60-65% with grade 2 diastolic dysfunction.  -stable  atrial fibrillation -CHADSVasc score of 4.  -initial lab work showing a subtherapeutic INR 1.22  -Continue warfarin with PharmD assistance -Lovenox bridge as pt has evidence of possible stroke  Diabetes mellitus type 2.  -CBGs controlled -novolog SSI -02/06/2016 hemoglobin A1c 7.3 -Allow for liberal glycemic  control  Right shoulder pain.  -x-ray of right shoulder performed in the ER which did not show acute abnormalities. Suspect arthritis. Will provide Voltaren gel -Neck xrays--neck for fracture or subluxation   Disposition Plan: Home in 5/14 if stable Family Communication: daughter updated at beside 02/06/16  Consultants: None  Code Status: FULL  Subjective: Patient became agitated and verbally abusive in the early morning 02/07/2016. The patient was aware of her location and her name. She denied any chest pain, shortness breath, abdominal pain. Remainder of the review of systems unobtainable secondary to patient's agitation   Objective: Filed Vitals:   02/06/16 2138 02/07/16 0406 02/07/16 0738 02/07/16 0815  BP: 147/63 153/60 76/58 152/74  Pulse: 104 50 67 64  Temp: 97.8 F (36.6 C) 97.8 F (36.6 C)    TempSrc: Oral Oral    Resp: 19 19    Height:      Weight:      SpO2: 98% 98%      Intake/Output Summary (Last 24 hours) at 02/07/16 0825 Last data filed at 02/06/16 1800  Gross per 24 hour  Intake    940 ml  Output      0 ml  Net    940 ml   Weight change:  Exam:   General:  Pt is alert, Agitated, and refuses to follow commands  HEENT: No icterus,  B[place]/AT  Cardiovascular: RRR, S1/S2, no rubs, no gallops  Respiratory: CTA bilaterally, no wheezing, no crackles, no rhonchi  Abdomen: Soft/+BS, non tender, non distended, no guarding  Extremities: No edema, No lymphangitis, No petechiae, No rashes, no synovitis   Data Reviewed: I have personally reviewed following labs and imaging studies Basic Metabolic Panel:  Recent Labs Lab 02/04/16 1123 02/04/16 1440 02/05/16 0459 02/06/16 0510  NA 143  --  139 140  K 4.4  --  4.1 4.3  CL 104  --  102 107  CO2 26  --  26 24  GLUCOSE 126*  --  108* 151*  BUN 19  --  19 17  CREATININE 1.17* 1.10* 1.01* 0.90  CALCIUM 9.9  --  9.3 8.8*   Liver Function Tests:  Recent Labs Lab 02/04/16 1123  AST 19  ALT 10*   ALKPHOS 56  BILITOT 0.7  PROT 7.3  ALBUMIN 4.0   No results for input(s): LIPASE, AMYLASE in the last 168 hours. No results for input(s): AMMONIA in the last 168 hours. Coagulation Profile:  Recent Labs Lab 02/04/16 1019 02/05/16 0459 02/06/16 0510  INR 1.22 1.27 1.40   CBC:  Recent Labs Lab 02/04/16 1019 02/04/16 1440 02/05/16 0459 02/06/16 0510  WBC 10.8* 11.0* 11.1* 9.1  NEUTROABS 7.9*  --   --   --   HGB 13.9 14.4 14.5 13.0  HCT 41.3 42.5 43.4 39.2  MCV 76.8* 76.3* 76.4* 76.6*  PLT 247 288 280 276   Cardiac Enzymes: No results for input(s): CKTOTAL, CKMB, CKMBINDEX, TROPONINI in the last 168 hours. BNP: Invalid input(s): POCBNP CBG:  Recent Labs Lab 02/05/16 2215 02/06/16 0724 02/06/16 1148 02/06/16 1700 02/06/16 2137  GLUCAP 221* 147* 207* 175* 185*   HbA1C:  Recent Labs  02/06/16 0510  HGBA1C 7.3*   Urine analysis:    Component Value Date/Time   COLORURINE YELLOW 02/04/2016 1042   APPEARANCEUR CLOUDY* 02/04/2016 1042   LABSPEC 1.021 02/04/2016 1042   PHURINE 6.5 02/04/2016 1042   GLUCOSEU NEGATIVE 02/04/2016 1042   HGBUR NEGATIVE 02/04/2016 1042   BILIRUBINUR NEGATIVE 02/04/2016 1042   KETONESUR NEGATIVE 02/04/2016 1042   PROTEINUR 30* 02/04/2016 1042   UROBILINOGEN 0.2 06/13/2015 1105   NITRITE NEGATIVE 02/04/2016 1042   LEUKOCYTESUR LARGE* 02/04/2016 1042   Sepsis Labs: @LABRCNTIP (procalcitonin:4,lacticidven:4) ) Recent Results (from the past 240 hour(s))  Urine culture     Status: Abnormal   Collection Time: 02/04/16 10:42 AM  Result Value Ref Range Status   Specimen Description URINE, CATHETERIZED  Final   Special Requests NONE  Final   Culture (A)  Final    >=100,000 COLONIES/mL AEROCOCCUS URINAE Standardized susceptibility testing for this organism is not available. Performed at [facility]    Report Status 02/06/2016 FINAL  Final     Scheduled Meds: . ampicillin (OMNIPEN) IV  2 g Intravenous Q6H  .  antiseptic oral rinse  7 mL Mouth Rinse q12n4p  . atorvastatin  20 mg Oral q1800  . beta carotene w/minerals  1 tablet Oral Daily  . chlorhexidine  15 mL Mouth Rinse BID  . diclofenac sodium  2 g Topical QID  . donepezil  5 mg Oral BH-q7a  . enoxaparin (LOVENOX) injection  100 mg Subcutaneous Q12H  . haloperidol lactate      . haloperidol lactate  2 mg Intravenous Once  . insulin aspart  0-9 Units Subcutaneous TID WC  . levothyroxine  50 mcg Oral QAC breakfast  . sodium chloride flush  3 mL Intravenous Q12H  . Warfarin - Pharmacist Dosing Inpatient   Does not apply q1800   Continuous Infusions:   Procedures/Studies: Dg Chest 1 View  02/04/2016  CLINICAL DATA:  Acute right-sided chest pain. EXAM: CHEST 1 VIEW COMPARISON:  September 08, 2015. FINDINGS: Stable cardiomegaly. No pneumothorax or significant pleural effusion is noted. Stable interstitial densities are noted throughout both lungs consistent with scarring. Bony thorax is unremarkable. IMPRESSION: Stable interstitial densities noted throughout  both lungs most consistent with scarring. Superimposed edema or inflammation cannot be excluded. Electronically Signed   By: Lupita Raider, M.D.   On: 02/04/2016 10:02   Dg Cervical Spine 2 Or 3 Views  02/05/2016  CLINICAL DATA:  Neck pain.  No known injury EXAM: CERVICAL SPINE - 2-3 VIEW COMPARISON:  None. FINDINGS: Frontal, lateral, and open-mouth odontoid images were obtained. There is no demonstrable fracture or spondylolisthesis. The prevertebral soft tissues and predental space regions are normal. There is mild disc space narrowing at C4-5, C5-6, and C6-7. No erosive change. IMPRESSION: Areas of osteoarthritic change. No demonstrable fracture or spondylolisthesis. Electronically Signed   By: Bretta Bang III M.D.   On: 02/05/2016 16:16   Ct Head Wo Contrast  02/04/2016  CLINICAL DATA:  Altered mental status, woke up this morning talking but not making sense, altered level of  consciousness, diabetes mellitus, chronic diastolic CHF, chronic atrial flutter, mild dementia, pulmonary hypertension EXAM: CT HEAD WITHOUT CONTRAST TECHNIQUE: Contiguous axial images were obtained from the base of the skull through the vertex without intravenous contrast. COMPARISON:  11/14/2006 FINDINGS: Generalized atrophy. Normal ventricular morphology. No midline shift or mass effect. Small vessel chronic ischemic changes of deep cerebral white matter progressive since previous exam. Poorly defined area of low attenuation within LEFT thalamus new since prior exam consistent with infarct, question acute versus subacute. No intracranial hemorrhage, mass lesion, or acute cortical infarction. Visualized paranasal sinuses and mastoid air cells clear. Bones unremarkable. Minimal atherosclerotic calcification at skullbase. IMPRESSION: Atrophy with progressive small vessel chronic ischemic changes of deep cerebral white matter. Acute versus subacute infarct LEFT thalamus. Electronically Signed   By: Ulyses Southward M.D.   On: 02/04/2016 09:42   Mr Brain Wo Contrast  02/05/2016  CLINICAL DATA:  82 year old female with altered mental status. Personal history of cognitive impairment at baseline. Initial encounter. EXAM: MRI HEAD WITHOUT CONTRAST TECHNIQUE: Multiplanar, multiecho pulse sequences of the brain and surrounding structures were obtained without intravenous contrast. COMPARISON:  Head CTs without contrast 02/04/2016 and earlier. FINDINGS: No restricted diffusion to suggest acute infarction. No midline shift, mass effect, evidence of mass lesion, ventriculomegaly, extra-axial collection or acute intracranial hemorrhage. Cervicomedullary junction and pituitary are within normal limits. Major intracranial vascular flow voids are within normal limits. Patchy and confluent bilateral cerebral white matter T2 and FLAIR hyperintensity in a nonspecific configuration. No associated cortical encephalomalacia or definite  chronic cerebral blood products. Mild for age T2 heterogeneity in the deep gray matter nuclei, suspect a chronic lacunar infarct of the left thalamus. Mild for age T2 hyperintensity in the pons. Cerebellum within normal limits. Visible internal auditory structures appear normal. Mastoids are clear. Paranasal sinuses are clear. Postoperative changes to both globes. Negative scalp soft tissues. Negative visualized cervical spine aside from ligamentous hypertrophy about the odontoid. Normal bone marrow signal. IMPRESSION: 1.  No acute intracranial abnormality. 2. Moderately advanced but nonspecific signal changes in the brain, favor chronic small vessel disease related. Electronically Signed   By: Odessa Fleming M.D.   On: 02/05/2016 15:29   Dg Humerus Right  02/04/2016  CLINICAL DATA:  Proximal RIGHT humeral pain, no known trauma EXAM: RIGHT HUMERUS - 2+ VIEW COMPARISON:  None FINDINGS: Mild osseous demineralization. AC joint alignment normal. Glenohumeral and elbow joint alignments normal. No acute fracture, dislocation, or bone destruction. IMPRESSION: No acute abnormalities. Electronically Signed   By: Ulyses Southward M.D.   On: 02/04/2016 09:50    Yostin Malacara, DO  [facility] Pager  [phone]  If 7PM-7AM, please contact night-coverage [email or web address] Password [email or web address] 02/07/2016, 8:25 AM   LOS: 3 days

## 2016-02-07 NOTE — Discharge Summary (Signed)
Physician Discharge Summary  Sara Dennis ZOX:[id number] DOB: January 06, 1922 DOA: 02/04/2016  PCP: Coralie Common, MD  Admit date: 02/04/2016 Discharge date: 02/08/2016  Recommendations for Outpatient Follow-up:  1. Pt will need to follow up with PCP in 2 weeks post discharge 2. Check INR and CBC on 5/171/17 3. Please continue lovenox 100mg  [place] bid until INR > 2   Discharge Diagnoses:  Acute Metabolic encephalopathy  -Secondary to UTI and dehydration -UA with TNTC WBC -MRI brain--neg -Continue fluids -02/07/16--Early AM became agitated and verbally abusive--Haldol 65m IV x 2-->calmed -Serum B12--316--> replete -Ammonia--23 -TSH 1.786  Urinary tract infection-Aerococcus -02/04/2016---UA with TNTC WBC -02/06/2016 d/c ceftriaxone, start ampicillin -02/07/2016-restarted ceftriaxone as the patient was more agitated -home with cefuroxime 500mg  bid x 5 more days -Prior to this hospitalization she had been on trimethoprim for UTI prophylaxis  Dehydration.  -appears dry clinically in the setting of nausea and vomiting -improved with IVF -Hold furosemide during the hospitalization  CKD stage 3 -baseline creatinine 1.0-1.3  chronic diastolic congestive heart failure. - 06/12/2015 echo EF 60-65% with grade 2 diastolic dysfunction.  -stable; clinically euvolemic  Permanent atrial fibrillation -CHADSVasc score of 4.  -initial lab work showing a subtherapeutic INR 1.22  -Continue warfarin with PharmD assistance -Lovenox bridge until INR >2 -home with coumadin 8 mg daily on 5/15 and 5/16 -INR check on 02/11/16  Recurrent PE (1978 & 2012) -continue warfarin with lovenox bridge  Diabetes mellitus type 2.  -CBGs controlled -novolog SSI -02/06/2016 hemoglobin A1c 7.3 -resume home dose lantus after d/c  Right shoulder pain.  -x-ray of right shoulder performed in the ER which did not show acute abnormalities. Suspect arthritis. Will provide Voltaren gel -Neck xrays--neck for  fracture or subluxation  Discharge Condition: stable  Disposition:  home  Diet:carb modified Wt Readings from Last 3 Encounters:  02/04/16 104.327 kg (230 lb)  09/15/15 105.688 kg (233 lb)  09/10/15 104.7 kg (230 lb 13.2 oz)    History of present illness:  82 y.o. female with medical history significant of cognitive impairment, poor functional status at baseline, A. fib who had been chronically anticoagulated with warfarin, chronic diastolic congestive heart failure, history of recurrent urinary tract infections, presenting to the emergency department with mental status changes. Family members reporting that she has had a steep decline over the past 24 hours, developing agitation, hallucinating, speaking with dead relatives, "talking out of her head" having minimal by mouth intake. She had episode of nausea and vomiting 02/04/16 AM. At baseline she has been nonambulatory since February required assistance with most activities of daily living. Workup revealed a UTI with Aerococcus species. Antibiotics were initially switched to ampicillin. However given the patient's increased agitation and lack of defined as susceptibilities for the organism, the patient was switched back to ceftriaxone IV. Her mental status and agitation improved. Workup for other reversible causes of delirium were unremarkable. The patient's renal function was optimized, and her serum creatinine improved from 1.17 to 0.88.  Due to the patient's age and comorbidities, the patient was allowed to have liberal glycemic control.   Discharge Exam: Filed Vitals:   02/07/16 2214 02/08/16 0559  BP: 172/67 147/66  Pulse: 61 58  Temp: 98.2 F (36.8 C) 98.3 F (36.8 C)  Resp: 18 18   Filed Vitals:   02/07/16 0815 02/07/16 1351 02/07/16 2214 02/08/16 0559  BP: 152/74 162/74 172/67 147/66  Pulse: 64 58 61 58  Temp:  98.6 F (37 C) 98.2 F (36.8 C) 98.3 F (36.8 C)  TempSrc:  Oral Oral Oral  Resp:  18 18 18   Height:        Weight:      SpO2:  94% 99% 97%   General: Awake and alert, NAD, pleasant Cardiovascular: RRR, no rub, no gallop, no S3 Respiratory: CTAB, no wheeze, no rhonchi Abdomen:soft, nontender, nondistended, positive bowel sounds Extremities: No edema, No lymphangitis, no petechiae  Discharge Instructions      Discharge Instructions    Diet - low sodium heart healthy    Complete by:  As directed      Discharge instructions    Complete by:  As directed   Please take coumadin 8 mg (2 tablets) on 02/09/16 and 02/10/16.  Then get go to your doctor to get coumadin level checked on 02/11/16.  Continue lovenox injections two times a day until told to stop by your coumadin clinic     Increase activity slowly    Complete by:  As directed             Medication List    STOP taking these medications        aspirin 325 MG EC tablet     insulin aspart 100 UNIT/ML injection  Commonly known as:  novoLOG     predniSONE 10 MG tablet  Commonly known as:  DELTASONE     traMADol-acetaminophen 37.5-325 MG tablet  Commonly known as:  ULTRACET     trimethoprim 100 MG tablet  Commonly known as:  TRIMPEX      TAKE these medications        acetaminophen 325 MG tablet  Commonly known as:  TYLENOL  Take 2 tablets (650 mg total) by mouth every 6 (six) hours as needed for mild pain (or Fever >/= 101).     atorvastatin 20 MG tablet  Commonly known as:  LIPITOR  Take 1 tablet (20 mg total) by mouth daily at 6 PM.     beta carotene w/minerals tablet  Take 1 tablet by mouth daily.     cefUROXime 500 MG tablet  Commonly known as:  CEFTIN  Take 1 tablet (500 mg total) by mouth 2 (two) times daily with a meal.     cyanocobalamin 500 MCG tablet  Take 1 tablet (500 mcg total) by mouth daily.     donepezil 5 MG tablet  Commonly known as:  ARICEPT  Take 5 mg by mouth every morning. Prefers to take in the morning     enoxaparin 100 MG/ML injection  Commonly known as:  LOVENOX  Inject 1 mL (100  mg total) into the skin every 12 (twelve) hours.     furosemide 40 MG tablet  Commonly known as:  LASIX  Take 1 tablet (40 mg total) by mouth 2 (two) times daily.     insulin glargine 100 UNIT/ML injection  Commonly known as:  LANTUS  Inject 0.3 mLs (30 Units total) into the skin 2 (two) times daily.     levothyroxine 50 MCG tablet  Commonly known as:  SYNTHROID, LEVOTHROID  Take 50 mcg by mouth daily before breakfast.     traMADol 50 MG tablet  Commonly known as:  ULTRAM  Take 25 mg by mouth daily.     warfarin 4 MG tablet  Commonly known as:  COUMADIN  Take 2 tablets (8 mg total) by mouth daily. On 02/09/16 and 02/10/16 and then 4mg  daily starting 02/11/16 or as instructed by coumadin clinic  Start taking on:  02/09/2016  The results of significant diagnostics from this hospitalization (including imaging, microbiology, ancillary and laboratory) are listed below for reference.    Significant Diagnostic Studies: Dg Chest 1 View  02/04/2016  CLINICAL DATA:  Acute right-sided chest pain. EXAM: CHEST 1 VIEW COMPARISON:  September 08, 2015. FINDINGS: Stable cardiomegaly. No pneumothorax or significant pleural effusion is noted. Stable interstitial densities are noted throughout both lungs consistent with scarring. Bony thorax is unremarkable. IMPRESSION: Stable interstitial densities noted throughout both lungs most consistent with scarring. Superimposed edema or inflammation cannot be excluded. Electronically Signed   By: Lupita Raider, M.D.   On: 02/04/2016 10:02   Dg Cervical Spine 2 Or 3 Views  02/05/2016  CLINICAL DATA:  Neck pain.  No known injury EXAM: CERVICAL SPINE - 2-3 VIEW COMPARISON:  None. FINDINGS: Frontal, lateral, and open-mouth odontoid images were obtained. There is no demonstrable fracture or spondylolisthesis. The prevertebral soft tissues and predental space regions are normal. There is mild disc space narrowing at C4-5, C5-6, and C6-7. No erosive change.  IMPRESSION: Areas of osteoarthritic change. No demonstrable fracture or spondylolisthesis. Electronically Signed   By: Bretta Bang III M.D.   On: 02/05/2016 16:16   Ct Head Wo Contrast  02/04/2016  CLINICAL DATA:  Altered mental status, woke up this morning talking but not making sense, altered level of consciousness, diabetes mellitus, chronic diastolic CHF, chronic atrial flutter, mild dementia, pulmonary hypertension EXAM: CT HEAD WITHOUT CONTRAST TECHNIQUE: Contiguous axial images were obtained from the base of the skull through the vertex without intravenous contrast. COMPARISON:  11/14/2006 FINDINGS: Generalized atrophy. Normal ventricular morphology. No midline shift or mass effect. Small vessel chronic ischemic changes of deep cerebral white matter progressive since previous exam. Poorly defined area of low attenuation within LEFT thalamus new since prior exam consistent with infarct, question acute versus subacute. No intracranial hemorrhage, mass lesion, or acute cortical infarction. Visualized paranasal sinuses and mastoid air cells clear. Bones unremarkable. Minimal atherosclerotic calcification at skullbase. IMPRESSION: Atrophy with progressive small vessel chronic ischemic changes of deep cerebral white matter. Acute versus subacute infarct LEFT thalamus. Electronically Signed   By: Ulyses Southward M.D.   On: 02/04/2016 09:42   Mr Brain Wo Contrast  02/05/2016  CLINICAL DATA:  82 year old female with altered mental status. Personal history of cognitive impairment at baseline. Initial encounter. EXAM: MRI HEAD WITHOUT CONTRAST TECHNIQUE: Multiplanar, multiecho pulse sequences of the brain and surrounding structures were obtained without intravenous contrast. COMPARISON:  Head CTs without contrast 02/04/2016 and earlier. FINDINGS: No restricted diffusion to suggest acute infarction. No midline shift, mass effect, evidence of mass lesion, ventriculomegaly, extra-axial collection or acute  intracranial hemorrhage. Cervicomedullary junction and pituitary are within normal limits. Major intracranial vascular flow voids are within normal limits. Patchy and confluent bilateral cerebral white matter T2 and FLAIR hyperintensity in a nonspecific configuration. No associated cortical encephalomalacia or definite chronic cerebral blood products. Mild for age T2 heterogeneity in the deep gray matter nuclei, suspect a chronic lacunar infarct of the left thalamus. Mild for age T2 hyperintensity in the pons. Cerebellum within normal limits. Visible internal auditory structures appear normal. Mastoids are clear. Paranasal sinuses are clear. Postoperative changes to both globes. Negative scalp soft tissues. Negative visualized cervical spine aside from ligamentous hypertrophy about the odontoid. Normal bone marrow signal. IMPRESSION: 1.  No acute intracranial abnormality. 2. Moderately advanced but nonspecific signal changes in the brain, favor chronic small vessel disease related. Electronically Signed   By: Althea Grimmer.D.  On: 02/05/2016 15:29   Dg Humerus Right  02/04/2016  CLINICAL DATA:  Proximal RIGHT humeral pain, no known trauma EXAM: RIGHT HUMERUS - 2+ VIEW COMPARISON:  None FINDINGS: Mild osseous demineralization. AC joint alignment normal. Glenohumeral and elbow joint alignments normal. No acute fracture, dislocation, or bone destruction. IMPRESSION: No acute abnormalities. Electronically Signed   By: Ulyses Southward M.D.   On: 02/04/2016 09:50     Microbiology: Recent Results (from the past 240 hour(s))  Urine culture     Status: Abnormal   Collection Time: 02/04/16 10:42 AM  Result Value Ref Range Status   Specimen Description URINE, CATHETERIZED  Final   Special Requests NONE  Final   Culture (A)  Final    >=100,000 COLONIES/mL AEROCOCCUS URINAE Standardized susceptibility testing for this organism is not available. Performed at [facility]    Report Status 02/06/2016 FINAL  Final      Labs: Basic Metabolic Panel:  Recent Labs Lab 02/04/16 1123 02/04/16 1440 02/05/16 0459 02/06/16 0510 02/07/16 0857 02/08/16 0538  NA 143  --  139 140 140 141  K 4.4  --  4.1 4.3 4.0 4.4  CL 104  --  102 107 107 109  CO2 26  --  GLUCOSE 126*  --  108* 151* 169* 263*  BUN 19  --  CREATININE 1.17* 1.10* 1.01* 0.90 0.88 0.76  CALCIUM 9.9  --  9.3 8.8* 9.1 9.2   Liver Function Tests:  Recent Labs Lab 02/04/16 1123 02/07/16 0857  AST 19 21  ALT 10* 12*  ALKPHOS 56 51  BILITOT 0.7 0.8  PROT 7.3 7.0  ALBUMIN 4.0 3.6   No results for input(s): LIPASE, AMYLASE in the last 168 hours.  Recent Labs Lab 02/07/16 0857  AMMONIA 23   CBC:  Recent Labs Lab 02/04/16 1019 02/04/16 1440 02/05/16 0459 02/06/16 0510 02/07/16 0857 02/08/16 0538  WBC 10.8* 11.0* 11.1* 9.1 10.7* 10.0  NEUTROABS 7.9*  --   --   --   --   --   HGB 13.9 14.4 14.5 13.0 13.6 13.4  HCT 41.3 42.5 43.4 39.2 40.7 40.6  MCV 76.8* 76.3* 76.4* 76.6* 77.1* 77.0*  PLT 247 288 280 276 242 269   Cardiac Enzymes: No results for input(s): CKTOTAL, CKMB, CKMBINDEX, TROPONINI in the last 168 hours. BNP: Invalid input(s): POCBNP CBG:  Recent Labs Lab 02/06/16 2137 02/07/16 1139 02/07/16 1702 02/07/16 2153 02/08/16 0739  GLUCAP 185* 198* 215* 173* 214*    Time coordinating discharge:  Greater than 30 minutes  Signed:  Kodah Maret, DO [facility] Pager: [phone] 02/08/2016, 9:21 AM

## 2016-02-08 DIAGNOSIS — I482 Chronic atrial fibrillation: Secondary | ICD-10-CM

## 2016-02-08 LAB — BASIC METABOLIC PANEL
Anion gap: 8 (ref 5–15)
BUN: 13 mg/dL (ref 6–20)
CHLORIDE: 109 mmol/L (ref 101–111)
CO2: 24 mmol/L (ref 22–32)
CREATININE: 0.76 mg/dL (ref 0.44–1.00)
Calcium: 9.2 mg/dL (ref 8.9–10.3)
GFR calc non Af Amer: >60 mL/min (ref >60)
GLUCOSE: 263 mg/dL — AB (ref 65–99)
Potassium: 4.4 mmol/L (ref 3.5–5.1)
Sodium: 141 mmol/L (ref 135–145)

## 2016-02-08 LAB — CBC
HEMATOCRIT: 40.6 % (ref 36.0–46.0)
HEMOGLOBIN: 13.4 g/dL (ref 12.0–15.0)
MCH: 25.4 pg — ABNORMAL LOW (ref 26.0–34.0)
MCHC: 33 g/dL (ref 30.0–36.0)
MCV: 77 fL — AB (ref 78.0–100.0)
Platelets: 269 10*3/uL (ref 150–400)
RBC: 5.27 MIL/uL — ABNORMAL HIGH (ref 3.87–5.11)
RDW: 17.2 % — ABNORMAL HIGH (ref 11.5–15.5)
WBC: 10 10*3/uL (ref 4.0–10.5)

## 2016-02-08 LAB — GLUCOSE, CAPILLARY
GLUCOSE-CAPILLARY: 219 mg/dL — AB (ref 65–99)
Glucose-Capillary: 214 mg/dL — ABNORMAL HIGH (ref 65–99)

## 2016-02-08 LAB — PROTIME-INR
INR: 1.36 (ref 0.00–1.49)
Prothrombin Time: 16.9 seconds — ABNORMAL HIGH (ref 11.6–15.2)

## 2016-02-08 MED ORDER — WARFARIN SODIUM 4 MG PO TABS: 8.0000 mg | ORAL_TABLET | Freq: Every day | ORAL | Status: DC

## 2016-02-08 MED ORDER — WARFARIN SODIUM 10 MG PO TABS
10.0000 mg | ORAL_TABLET | Freq: Once | ORAL | Status: AC
  Administered 2016-02-08: 10 mg via ORAL
  Filled 2016-02-08: qty 1

## 2016-02-08 MED ORDER — CEFUROXIME AXETIL 500 MG PO TABS: 500.0000 mg | ORAL_TABLET | Freq: Two times a day (BID) | ORAL | Status: DC

## 2016-02-08 MED ORDER — CYANOCOBALAMIN 500 MCG PO TABS: 500.0000 ug | ORAL_TABLET | Freq: Every day | ORAL | Status: DC

## 2016-02-08 MED ORDER — ENOXAPARIN SODIUM 100 MG/ML ~~LOC~~ SOLN: 100.0000 mg | Freq: Two times a day (BID) | SUBCUTANEOUS | Status: DC

## 2016-02-08 NOTE — Progress Notes (Signed)
Patient discharged.  Spoke with pt daughter, [name], and explained discharge instructions.  Annice Pih reports understanding of discharge instructions.  Patient leaving with prescriptions and personal belongings.  Home health set up for patient for Nurse and PT.  Patient alert and oriented, denies pain, room air, respirations even and unlabored.  [facility] transporting patient from hospital to home.

## 2016-02-08 NOTE — Care Management Note (Signed)
Case Management Note  Patient Details  Name: SHANERIA SPURBECK MRN: [id number] Date of Birth: October 19, 1921  Subjective/Objective:  UTI              Action/Plan: Discharge Planning: AVS reviewed:   NCM spoke to pt and requested NCM contacted dtr, [name] # [phone] via phone. Dtr requesting ambulance transport. Offered choice/HH list given to pt. Requested [facility] for [facility]. Contacted [facility] Liaison for [facility] for start of care 02/09/2016. Dtr states she has given insulin injections so she feels comfortable giving Lovenox. She has wheelchair, transport chair and bedside commode at home.   PCP Liliane Bade MD  Expected Discharge Date: 02/08/2016           Expected Discharge Plan:  Home w Home Health Services  In-House Referral:  NA  Discharge planning Services  CM Consult  Post Acute Care Choice:  Home Health Choice offered to:  Adult Children  DME Arranged:  N/A DME Agency:  NA  HH Arranged:  RN, PT HH Agency:  [facility]  Status of Service:  Completed, signed off  Medicare Important Message Given:  Yes Date Medicare IM Given:    Medicare IM give by:    Date Additional Medicare IM Given:    Additional Medicare Important Message give by:     If discussed at Long Length of Stay Meetings, dates discussed:    Additional Comments:  Elliot Cousin, RN 02/08/2016, 9:40 AM

## 2016-02-08 NOTE — Care Management Important Message (Signed)
Important Message  Patient Details  Name: Sara Dennis MRN: [id number] Date of Birth: [birth year]   Medicare Important Message Given:  Yes    Elliot Cousin, RN 02/08/2016, 9:40 AM

## 2016-02-08 NOTE — Progress Notes (Signed)
ANTICOAGULATION CONSULT NOTE - Follow up consult  Pharmacy Consult for Warfarin, Lovenox Indication: atrial fibrillation  Allergies  Allergen Reactions  . Gemfibrozil Nausea And Vomiting and Other (See Comments)    Sick and give urine infection  . Iohexol Itching  . Ivp Dye [Iodinated Diagnostic Agents] Itching    itching  . Bactrim Itching, Nausea And Vomiting and Rash    And diarrhea  . Sulfamethoxazole-Trimethoprim Itching, Nausea And Vomiting and Rash    And diarrhea    Patient Measurements: Height: 5\' 7"  (170.2 cm) Weight: 230 lb (104.327 kg) IBW/kg (Calculated) : 61.6  Vital Signs: Temp: 98.3 F (36.8 C) (05/14 0559) Temp Source: Oral (05/14 0559) BP: 147/66 mmHg (05/14 0559) Pulse Rate: 58 (05/14 0559)  Labs:  Recent Labs  02/06/16 0510 02/07/16 0857 02/08/16 0538  HGB 13.0 13.6 13.4  HCT 39.2 40.7 40.6  PLT 276 242 269  LABPROT 17.2* 16.7* 16.9*  INR 1.40 1.34 1.36  CREATININE 0.90 0.88 0.76    Estimated Creatinine Clearance: 56.9 mL/min (by C-G formula based on Cr of 0.76).   Assessment: 89 yoF on chronic warfarin for atrial fibrillation (CHADSVASC 4) admitted with AMS, UTI.  INR subtherapeutic on admission.  Pharmacy consulted to manage warfarin inpatient as well as Lovenox while INR is subtherapeutic d/t stroke symptoms. Home warfarin dose reported as 4 mg daily, LD 5/9 at 1930 (but previous regimen from 05/2015 shows dose as 5mg  alternating with 7.5 mg)  Today, 02/08/2016: - INR 1.36, remains sub-therapeutic and decreased today despite boosted doses x4 days.  Lower INR may be related to removal of DDI with home med Trimethoprim (not resumed on admission) - CBC:  Hgb and Plt stable/WNL - No bleeding documented - Carb control diet: eating ~100% meals - Drug-drug intxns: ceftriaxone may prolong INR - MRI head on 5/11 showed no signs of acute infarct  Goal of Therapy:  INR 2-3 Monitor platelets by anticoagulation protocol: Yes   Plan:  - Warfarin  10 mg once today at 1200. - Continue Lovenox 100 mg SQ q12   - Daily INR. Check CBC at least q72h. - Monitor for s/s bleeding  Unfortunately she has not yet responded to boosted warfarin doses (5/10-5/12 6mg , then 5/13 8mg ) and if discharged today, would discharge on higher warfarin dose 8mg  daily x2 doses then recheck INR at least by Wednesday, 5/17.  Lynann Beaver PharmD, BCPS Pager [phone] 02/08/2016 8:26 AM

## 2016-03-16 ENCOUNTER — Observation Stay (HOSPITAL_COMMUNITY)
Admission: EM | Admit: 2016-03-16 | Discharge: 2016-03-17 | Disposition: A | Discharge status: Home / Self Care | Payer: Medicare HMO | Attending: Internal Medicine | Admitting: Internal Medicine

## 2016-03-16 ENCOUNTER — Emergency Department (HOSPITAL_COMMUNITY): Payer: Medicare HMO

## 2016-03-16 ENCOUNTER — Other Ambulatory Visit: Payer: Self-pay

## 2016-03-16 ENCOUNTER — Encounter (HOSPITAL_COMMUNITY): Payer: Self-pay | Admitting: Emergency Medicine

## 2016-03-16 DIAGNOSIS — Z86711 Personal history of pulmonary embolism: Secondary | ICD-10-CM | POA: Diagnosis not present

## 2016-03-16 DIAGNOSIS — F0390 Unspecified dementia without behavioral disturbance: Secondary | ICD-10-CM | POA: Insufficient documentation

## 2016-03-16 DIAGNOSIS — E1122 Type 2 diabetes mellitus with diabetic chronic kidney disease: Secondary | ICD-10-CM | POA: Insufficient documentation

## 2016-03-16 DIAGNOSIS — R7989 Other specified abnormal findings of blood chemistry: Secondary | ICD-10-CM | POA: Insufficient documentation

## 2016-03-16 DIAGNOSIS — N183 Chronic kidney disease, stage 3 (moderate): Secondary | ICD-10-CM | POA: Diagnosis not present

## 2016-03-16 DIAGNOSIS — I482 Chronic atrial fibrillation: Secondary | ICD-10-CM | POA: Insufficient documentation

## 2016-03-16 DIAGNOSIS — R4182 Altered mental status, unspecified: Secondary | ICD-10-CM | POA: Diagnosis present

## 2016-03-16 DIAGNOSIS — T45515A Adverse effect of anticoagulants, initial encounter: Secondary | ICD-10-CM | POA: Insufficient documentation

## 2016-03-16 DIAGNOSIS — D72829 Elevated white blood cell count, unspecified: Secondary | ICD-10-CM | POA: Diagnosis not present

## 2016-03-16 DIAGNOSIS — Z7901 Long term (current) use of anticoagulants: Secondary | ICD-10-CM | POA: Diagnosis not present

## 2016-03-16 DIAGNOSIS — D6832 Hemorrhagic disorder due to extrinsic circulating anticoagulants: Secondary | ICD-10-CM | POA: Diagnosis present

## 2016-03-16 DIAGNOSIS — E1169 Type 2 diabetes mellitus with other specified complication: Secondary | ICD-10-CM

## 2016-03-16 DIAGNOSIS — E039 Hypothyroidism, unspecified: Secondary | ICD-10-CM | POA: Diagnosis not present

## 2016-03-16 DIAGNOSIS — F05 Delirium due to known physiological condition: Secondary | ICD-10-CM | POA: Diagnosis not present

## 2016-03-16 DIAGNOSIS — Z79899 Other long term (current) drug therapy: Secondary | ICD-10-CM | POA: Diagnosis not present

## 2016-03-16 DIAGNOSIS — I272 Other secondary pulmonary hypertension: Secondary | ICD-10-CM | POA: Insufficient documentation

## 2016-03-16 DIAGNOSIS — D689 Coagulation defect, unspecified: Secondary | ICD-10-CM | POA: Insufficient documentation

## 2016-03-16 DIAGNOSIS — T45511A Poisoning by anticoagulants, accidental (unintentional), initial encounter: Secondary | ICD-10-CM | POA: Diagnosis not present

## 2016-03-16 DIAGNOSIS — Z794 Long term (current) use of insulin: Secondary | ICD-10-CM | POA: Diagnosis not present

## 2016-03-16 DIAGNOSIS — I5032 Chronic diastolic (congestive) heart failure: Secondary | ICD-10-CM | POA: Insufficient documentation

## 2016-03-16 DIAGNOSIS — R410 Disorientation, unspecified: Secondary | ICD-10-CM | POA: Insufficient documentation

## 2016-03-16 DIAGNOSIS — E119 Type 2 diabetes mellitus without complications: Secondary | ICD-10-CM

## 2016-03-16 LAB — URINALYSIS, ROUTINE W REFLEX MICROSCOPIC
BILIRUBIN URINE: NEGATIVE
Glucose, UA: NEGATIVE mg/dL
HGB URINE DIPSTICK: NEGATIVE
Ketones, ur: NEGATIVE mg/dL
Leukocytes, UA: NEGATIVE
NITRITE: NEGATIVE
PROTEIN: 30 mg/dL — AB
SPECIFIC GRAVITY, URINE: 1.014 (ref 1.005–1.030)
pH: 6.5 (ref 5.0–8.0)

## 2016-03-16 LAB — GLUCOSE, CAPILLARY: Glucose-Capillary: 168 mg/dL — ABNORMAL HIGH (ref 65–99)

## 2016-03-16 LAB — CBC WITH DIFFERENTIAL/PLATELET
BASOS ABS: 0.1 10*3/uL (ref 0.0–0.1)
BASOS PCT: 1 %
Eosinophils Absolute: 0.1 10*3/uL (ref 0.0–0.7)
Eosinophils Relative: 1 %
HEMATOCRIT: 43.1 % (ref 36.0–46.0)
Hemoglobin: 15.3 g/dL — ABNORMAL HIGH (ref 12.0–15.0)
LYMPHS PCT: 20 %
Lymphs Abs: 2.6 10*3/uL (ref 0.7–4.0)
MCH: 26.8 pg (ref 26.0–34.0)
MCHC: 35.5 g/dL (ref 30.0–36.0)
MCV: 75.5 fL — AB (ref 78.0–100.0)
MONO ABS: 1.2 10*3/uL — AB (ref 0.1–1.0)
Monocytes Relative: 10 %
NEUTROS ABS: 8.9 10*3/uL — AB (ref 1.7–7.7)
Neutrophils Relative %: 68 %
PLATELETS: 272 10*3/uL (ref 150–400)
RBC: 5.71 MIL/uL — AB (ref 3.87–5.11)
RDW: 15.9 % — AB (ref 11.5–15.5)
WBC: 12.8 10*3/uL — AB (ref 4.0–10.5)

## 2016-03-16 LAB — BASIC METABOLIC PANEL
ANION GAP: 9 (ref 5–15)
BUN: 16 mg/dL (ref 6–20)
CALCIUM: 9.5 mg/dL (ref 8.9–10.3)
CO2: 25 mmol/L (ref 22–32)
Chloride: 105 mmol/L (ref 101–111)
Creatinine, Ser: 0.91 mg/dL (ref 0.44–1.00)
GFR calc Af Amer: >60 mL/min (ref >60)
GFR, EST NON AFRICAN AMERICAN: 54 mL/min — AB (ref >60)
Glucose, Bld: 189 mg/dL — ABNORMAL HIGH (ref 65–99)
POTASSIUM: 4.1 mmol/L (ref 3.5–5.1)
Sodium: 139 mmol/L (ref 135–145)

## 2016-03-16 LAB — PROTIME-INR
INR: 1.39 (ref 0.00–1.49)
Prothrombin Time: 17.1 seconds — ABNORMAL HIGH (ref 11.6–15.2)

## 2016-03-16 LAB — URINE MICROSCOPIC-ADD ON

## 2016-03-16 MED ORDER — LACTATED RINGERS IV SOLN
INTRAVENOUS | Status: AC
  Administered 2016-03-16: 22:00:00 via INTRAVENOUS

## 2016-03-16 MED ORDER — ONDANSETRON HCL 4 MG/2ML IJ SOLN: 4.0000 mg | Freq: Four times a day (QID) | INTRAMUSCULAR | Status: DC | PRN

## 2016-03-16 MED ORDER — VITAMIN B-12 1000 MCG PO TABS
500.0000 ug | ORAL_TABLET | Freq: Every day | ORAL | Status: DC
  Filled 2016-03-16: qty 1

## 2016-03-16 MED ORDER — WARFARIN - PHARMACIST DOSING INPATIENT: Freq: Every day | Status: DC

## 2016-03-16 MED ORDER — LEVOTHYROXINE SODIUM 50 MCG PO TABS
50.0000 ug | ORAL_TABLET | Freq: Every day | ORAL | Status: DC
  Administered 2016-03-17: 50 ug via ORAL
  Filled 2016-03-16: qty 1

## 2016-03-16 MED ORDER — INSULIN GLARGINE 100 UNIT/ML ~~LOC~~ SOLN
35.0000 [IU] | Freq: Two times a day (BID) | SUBCUTANEOUS | Status: DC
  Administered 2016-03-16 – 2016-03-17 (×2): 35 [IU] via SUBCUTANEOUS
  Filled 2016-03-16 (×2): qty 0.35

## 2016-03-16 MED ORDER — ENOXAPARIN SODIUM 100 MG/ML ~~LOC~~ SOLN
100.0000 mg | Freq: Two times a day (BID) | SUBCUTANEOUS | Status: DC
  Administered 2016-03-16: 100 mg via SUBCUTANEOUS
  Filled 2016-03-16: qty 1

## 2016-03-16 MED ORDER — PROSIGHT PO TABS
1.0000 | ORAL_TABLET | Freq: Every day | ORAL | Status: DC
  Filled 2016-03-16: qty 1

## 2016-03-16 MED ORDER — SODIUM CHLORIDE 0.9% FLUSH: 3.0000 mL | Freq: Two times a day (BID) | INTRAVENOUS | Status: DC

## 2016-03-16 MED ORDER — FUROSEMIDE 40 MG PO TABS
40.0000 mg | ORAL_TABLET | Freq: Every day | ORAL | Status: DC
  Filled 2016-03-16: qty 1

## 2016-03-16 MED ORDER — WARFARIN SODIUM 5 MG PO TABS
10.0000 mg | ORAL_TABLET | Freq: Once | ORAL | Status: AC
  Administered 2016-03-16: 10 mg via ORAL
  Filled 2016-03-16: qty 2

## 2016-03-16 MED ORDER — ACETAMINOPHEN 325 MG PO TABS
650.0000 mg | ORAL_TABLET | Freq: Four times a day (QID) | ORAL | Status: DC | PRN
  Administered 2016-03-17: 650 mg via ORAL
  Filled 2016-03-16: qty 2

## 2016-03-16 MED ORDER — TRAMADOL HCL 50 MG PO TABS: 50.0000 mg | ORAL_TABLET | Freq: Four times a day (QID) | ORAL | Status: DC | PRN

## 2016-03-16 MED ORDER — ONDANSETRON HCL 4 MG PO TABS: 4.0000 mg | ORAL_TABLET | Freq: Four times a day (QID) | ORAL | Status: DC | PRN

## 2016-03-16 MED ORDER — INSULIN ASPART 100 UNIT/ML ~~LOC~~ SOLN
0.0000 [IU] | Freq: Three times a day (TID) | SUBCUTANEOUS | Status: DC
  Administered 2016-03-17: 3 [IU] via SUBCUTANEOUS

## 2016-03-16 MED ORDER — DOCUSATE SODIUM 100 MG PO CAPS
100.0000 mg | ORAL_CAPSULE | Freq: Two times a day (BID) | ORAL | Status: DC
  Administered 2016-03-16: 100 mg via ORAL
  Filled 2016-03-16 (×2): qty 1

## 2016-03-16 MED ORDER — ATORVASTATIN CALCIUM 20 MG PO TABS: 20.0000 mg | ORAL_TABLET | Freq: Every day | ORAL | Status: DC

## 2016-03-16 MED ORDER — OCUVITE PO TABS: 1.0000 | ORAL_TABLET | Freq: Every day | ORAL | Status: DC

## 2016-03-16 MED ORDER — DEXTROSE 5 % IV SOLN
1.0000 g | Freq: Once | INTRAVENOUS | Status: DC
  Filled 2016-03-16: qty 10

## 2016-03-16 MED ORDER — DONEPEZIL HCL 5 MG PO TABS
5.0000 mg | ORAL_TABLET | Freq: Every day | ORAL | Status: DC
  Administered 2016-03-16: 5 mg via ORAL
  Filled 2016-03-16 (×2): qty 1

## 2016-03-16 MED ORDER — WARFARIN SODIUM 6 MG PO TABS: 6.0000 mg | ORAL_TABLET | Freq: Every day | ORAL | Status: DC

## 2016-03-16 NOTE — ED Notes (Signed)
Bed: WA22 Expected date:  Expected time:  Means of arrival:  Comments: 

## 2016-03-16 NOTE — H&P (Signed)
History and Physical    Sara Dennis:[id number] DOB: 1922/09/25 DOA: 03/16/2016  Referring MD/NP/PA: None PCP: Coralie Common, MD (home visits) Outpatient Specialists:  Patient coming from: Home, daughter is caretaker  Chief Complaint: AMS HPI: Sara Dennis is a 82 y.o. female with medical history significant of dementia presenting with worsening confusion since last night.  About 2100 last evening she acutely started "talking our her head, talking crazy", hallucinating.  She didn't sleep at all, was agitated, and has refused to eat/drink for the last 24 hours.  She was confused and wouldn't allow her daughter to change her adult Depends.  She had a similar episode in early May and at that time was found to have a UTI.  She was very clammy this AM as well.  Her only other apparent concerns are chronic R shoulder and L knee pain.  She was admitted from Nov-Jan of this past year at [facility] and discharged to a SNF following UTI and dehydration.  ED Course: Routine labs/head CT  Review of Systems: Unable to perform due to severe dementia   Past Medical History  Diagnosis Date  . Diabetes mellitus   . Chronic diastolic CHF (congestive heart failure) (HCC)   . Pulmonary embolus (HCC) 03-23-11    a. 1978 and 2012.  . Arthritis     left leg  . UTI (lower urinary tract infection)   . Hypothyroidism   . Permanent atrial fibrillation (HCC)   . Glaucoma     a. with significantly decreased vision.  . Chronic atrial flutter (HCC)   . Bradycardia     a. 05/2015: HR 40s-50s, occ 30s at night - not clear how much symptoms were r/t this, patient declined PPM at that time.  . CKD (chronic kidney disease), stage III   . Mild dementia   . Moderate to severe pulmonary hypertension (HCC) By echo 05/2015  . RBBB     Past Surgical History  Procedure Laterality Date  . Kisney stone removal  1978    removed thru bladder  . Cystoscopy w/ ureteral stent placement  09/06/2011    Procedure: CYSTOSCOPY  WITH RETROGRADE PYELOGRAM/URETERAL STENT PLACEMENT;  Surgeon: Valetta Fuller, MD;  Location: [facility];  Service: Urology;  Laterality: Right;  cysto double j stent placement  . Ureteroscopy  09/06/2011    Procedure: URETEROSCOPY;  Surgeon: Valetta Fuller, MD;  Location: [facility];  Service: Urology;  Laterality: Right;     reports that she has never smoked. She has never used smokeless tobacco. She reports that she does not drink alcohol or use illicit drugs.  Allergies  Allergen Reactions  . Gemfibrozil Nausea And Vomiting and Other (See Comments)    Sick and give urine infection  . Iohexol Itching  . Ivp Dye [Iodinated Diagnostic Agents] Itching    itching  . Bactrim Itching, Nausea And Vomiting and Rash    And diarrhea  . Sulfamethoxazole-Trimethoprim Itching, Nausea And Vomiting and Rash    And diarrhea    Family History  Problem Relation Age of Onset  . Hypertension       Prior to Admission medications   Medication Sig Start Date End Date Taking? Authorizing Provider  atorvastatin (LIPITOR) 20 MG tablet Take 1 tablet (20 mg total) by mouth daily at 6 PM. 08/11/15  Yes Chrystie Nose, MD  beta carotene w/minerals (OCUVITE) tablet Take 1 tablet by mouth daily.   Yes Historical Provider, MD  cyanocobalamin 500 MCG tablet Take 1  tablet (500 mcg total) by mouth daily. 02/08/16  Yes Catarina Hartshorn, MD  donepezil (ARICEPT) 5 MG tablet Take 5 mg by mouth at bedtime.    Yes Historical Provider, MD  enoxaparin (LOVENOX) 100 MG/ML injection Inject 1 mL (100 mg total) into the skin every 12 (twelve) hours. Patient taking differently: Inject 100 mg into the skin daily.  02/08/16  Yes Catarina Hartshorn, MD  furosemide (LASIX) 40 MG tablet Take 1 tablet (40 mg total) by mouth 2 (two) times daily. Patient taking differently: Take 40 mg by mouth daily.  09/10/15  Yes Jessica U Vann, DO  insulin glargine (LANTUS) 100 UNIT/ML injection Inject 0.3 mLs (30 Units total) into the skin 2 (two) times daily. Patient  taking differently: Inject 35 Units into the skin 2 (two) times daily.  09/10/15  Yes Joseph Art, DO  levothyroxine (SYNTHROID, LEVOTHROID) 50 MCG tablet Take 50 mcg by mouth daily before breakfast.   Yes Historical Provider, MD  traMADol (ULTRAM) 50 MG tablet Take 50 mg by mouth every 6 (six) hours as needed for moderate pain.    Yes Historical Provider, MD  warfarin (COUMADIN) 4 MG tablet Take 2 tablets (8 mg total) by mouth daily. On 02/09/16 and 02/10/16 and then 4mg  daily starting 02/11/16 or as instructed by coumadin clinic Patient taking differently: Take 6 mg by mouth daily.  02/09/16  Yes Catarina Hartshorn, MD  acetaminophen (TYLENOL) 325 MG tablet Take 2 tablets (650 mg total) by mouth every 6 (six) hours as needed for mild pain (or Fever >/= 101). 09/10/15   Joseph Art, DO  cefUROXime (CEFTIN) 500 MG tablet Take 1 tablet (500 mg total) by mouth 2 (two) times daily with a meal. Patient not taking: Reported on 03/16/2016 02/08/16   Catarina Hartshorn, MD    Physical Exam: Filed Vitals:   03/16/16 1700 03/16/16 1900 03/16/16 1930 03/16/16 2016  BP: 158/68 158/72 138/61 165/51  Pulse: 63 53 60 52  Temp:    98.1 F (36.7 C)  TempSrc:    Oral  Resp: 16 10 20 18   Height:    5\' 4"  (1.626 m)  Weight:    93.6 kg (206 lb 5.6 oz)  SpO2: 98% 97% 96% 98%      Constitutional: NAD, calm, comfortable Filed Vitals:   03/16/16 1700 03/16/16 1900 03/16/16 1930 03/16/16 2016  BP: 158/68 158/72 138/61 165/51  Pulse: 63 53 60 52  Temp:    98.1 F (36.7 C)  TempSrc:    Oral  Resp: 16 10 20 18   Height:    5\' 4"  (1.626 m)  Weight:    93.6 kg (206 lb 5.6 oz)  SpO2: 98% 97% 96% 98%   Gen: obtunded, snoring pleasantly in NAD Eyes: PERRL, lids and conjunctivae normal ENMT: Mucous membranes are mildly dry.  Neck: normal, supple, no masses, no thyromegaly Respiratory: clear to auscultation bilaterally, no wheezing, no crackles. Normal respiratory effort. No accessory muscle use.  Cardiovascular: Regular rate  and rhythm, no murmurs / rubs / gallops. No extremity edema. 2+ pedal pulses. No carotid bruits.  Abdomen: no tenderness, no masses palpated. No hepatosplenomegaly. Bowel sounds positive.  Musculoskeletal: no clubbing / cyanosis. No joint deformity upper and lower extremities. Good ROM, no contractures. Normal muscle tone.  Skin: no rashes, lesions, ulcers. No induration.  +LE venous stasis without dermatitis. Neurologic: Unable to perform Psychiatric: Unable to perform    Labs on Admission: I have personally reviewed following labs and imaging studies  CBC:  Recent Labs Lab 03/16/16 1216  WBC 12.8*  NEUTROABS 8.9*  HGB 15.3*  HCT 43.1  MCV 75.5*  PLT 272   Basic Metabolic Panel:  Recent Labs Lab 03/16/16 1216  NA 139  K 4.1  CL 105  CO2 25  GLUCOSE 189*  BUN 16  CREATININE 0.91  CALCIUM 9.5   GFR: Estimated Creatinine Clearance: 44.7 mL/min (by C-G formula based on Cr of 0.91). Liver Function Tests: No results for input(s): AST, ALT, ALKPHOS, BILITOT, PROT, ALBUMIN in the last 168 hours. No results for input(s): LIPASE, AMYLASE in the last 168 hours. No results for input(s): AMMONIA in the last 168 hours. Coagulation Profile:  Recent Labs Lab 03/16/16 1216  INR 1.39   Cardiac Enzymes: No results for input(s): CKTOTAL, CKMB, CKMBINDEX, TROPONINI in the last 168 hours. BNP (last 3 results) No results for input(s): PROBNP in the last 8760 hours. HbA1C: No results for input(s): HGBA1C in the last 72 hours. CBG:  Recent Labs Lab 03/16/16 2150  GLUCAP 168*   Lipid Profile: No results for input(s): CHOL, HDL, LDLCALC, TRIG, CHOLHDL, LDLDIRECT in the last 72 hours. Thyroid Function Tests: No results for input(s): TSH, T4TOTAL, FREET4, T3FREE, THYROIDAB in the last 72 hours. Anemia Panel: No results for input(s): VITAMINB12, FOLATE, FERRITIN, TIBC, IRON, RETICCTPCT in the last 72 hours. Urine analysis:    Component Value Date/Time   COLORURINE YELLOW  03/16/2016 1149   APPEARANCEUR CLEAR 03/16/2016 1149   LABSPEC 1.014 03/16/2016 1149   PHURINE 6.5 03/16/2016 1149   GLUCOSEU NEGATIVE 03/16/2016 1149   HGBUR NEGATIVE 03/16/2016 1149   BILIRUBINUR NEGATIVE 03/16/2016 1149   KETONESUR NEGATIVE 03/16/2016 1149   PROTEINUR 30* 03/16/2016 1149   UROBILINOGEN 0.2 06/13/2015 1105   NITRITE NEGATIVE 03/16/2016 1149   LEUKOCYTESUR NEGATIVE 03/16/2016 1149   Sepsis Labs: @LABRCNTIP (procalcitonin:4,lacticidven:4) )No results found for this or any previous visit (from the past 240 hour(s)).   Radiological Exams on Admission: Ct Head Wo Contrast  03/16/2016  CLINICAL DATA:  History of dementia with altered mental status today EXAM: CT HEAD WITHOUT CONTRAST TECHNIQUE: Contiguous axial images were obtained from the base of the skull through the vertex without intravenous contrast. COMPARISON:  MRI 02/05/2016, CT head 02/04/2016 FINDINGS: Stable increased attenuation in the posterior compartment of the right eye when compared to 02/04/2016. This is not specific but could represent retinal detachment. Diffuse atrophy is again identified. There is low attenuation throughout the deep white matter as previously described. Tiny lacunar infarct left thalamus again noted. There is no evidence of acute vascular territory infarct or mass. There is no intracranial hemorrhage or extra-axial fluid. There is no hydrocephalus. Calvarium is intact. IMPRESSION: No acute findings. Stable increased attenuation posterior compartment of the right eye stable from 02/04/2016 but new from 2008. See above comments and correlate with ophthalmological examination. Electronically Signed   By: Esperanza Heir M.D.   On: 03/16/2016 17:51   Dg Chest Portable 1 View  03/16/2016  CLINICAL DATA:  Worsening confusion weakness. Right chest and shoulder pain. EXAM: PORTABLE CHEST 1 VIEW COMPARISON:  02/04/2016 and 06/11/2015 FINDINGS: The patient is partially rotated to the right. Heart size  is stable. Low lung volumes again seen. Coarsening of interstitial lung markings again seen bilaterally,, likely due to chronic interstitial lung disease. No evidence of superimposed pulmonary consolidation or pleural effusion. No pneumothorax visualized. IMPRESSION: Low lung volumes and probable chronic interstitial lung disease, without significant change compared to prior studies. No acute findings identified.  Electronically Signed   By: Myles Rosenthal M.D.   On: 03/16/2016 11:47    EKG: Independently reviewed.   Assessment/Plan Principal Problem:   Delirium due to another medical condition, acute, mixed level of activity Active Problems:   Leukocytosis   Diabetes mellitus (HCC)   Warfarin-induced coagulopathy (HCC)  1. Delirium - uncertain etiology, previously hyperactive according to family but hypoactive at the time of my evaluation.  Evaluation thus far has been negative for acute source.  She does have a leukocytosis but no other indication of infection.  Will admit to observation status overnight and recheck CBC in AM.  Blood cultures are pending.  Antibiotics were held in the ER.  If blood cultures negative tomorrow and patient otherwise with ongoing clinical stability, would suggest discharge at that time with delirium thought to be resulting from underlying dementia.  While the patient does have afib with subtherapeutic INR and therefore is technically more likely to have CVA, there is no other indication at this time that this is TIA/CVA related and head CT is negative so will not plan to further pursue MRI at this time.  2. Leukocytosis - as above.  Prior CBCs from May 2017 have shown WBC count in 9.1-11.1 range and so 12.8 may not be far from her norm.  U/A is notably normal at this time.  No current other indication of infection either from a history, physical exam, laboratory, or imaging standpoint.  Will f/u blood culture results but otherwise no treatment at this time.  3. DM -  Continue home Lantus and also cover with SSI.  4. Warfarin-induced coagulopathy - chronically subtherapeutic INR.  Will request pharmacy consult to assist with Coumadin dosing as her CHADS2-VASC score does indicate ongoing need for therapy.  DVT prophylaxis:  Treatment-dose Lovenox while Coumadin is subtherapeutic Code Status:  Full (previously DNR, but this was specifically addressed with patient's daughter at the bedside) Family Communication:  Daughter Annice Pih present and agrees with plan of care Disposition Plan:  Home Consults called: none Admission status: observation   Jonah Blue MD [facility] Pager [phone]  If 7PM-7AM, please contact night-coverage [email or web address] Password [email or web address]  03/17/2016, 1:47 AM

## 2016-03-16 NOTE — ED Notes (Signed)
One unsuccessful blood draw attempt and unable to pull from IV.

## 2016-03-16 NOTE — ED Notes (Addendum)
Per EMS. Pt from home. Pt has hx of dementia, more confused than normal today per caregiver. Also hallucinating her granddaughter Sheryle Hail is with her. Caregiver reports pt had a UTI 2 months ago and is having similar symptoms today. Did not want to come to ED with EMS, but was persuaded. Pt reports she has been urinating more than usual, no dysuria.

## 2016-03-16 NOTE — ED Provider Notes (Signed)
CSN: [id number]     Arrival date & time 03/16/16  1042 History   First MD Initiated Contact with Patient 03/16/16 1059     Chief Complaint  Patient presents with  . Altered Mental Status  LEVEL 5 CAVEAT DUE TO DEMENTIA   Patient is a 82 y.o. female presenting with altered mental status. The history is provided by the patient. The history is limited by the condition of the patient.  Altered Mental Status Presenting symptoms: confusion   Severity:  Moderate Most recent episode:  Today Timing:  Constant Progression:  Worsening Chronicity:  New Associated symptoms: no vomiting   Patient presents for altered mental status She is from home Per caregiver (not present during my evaluation) pt has had more confusion Concern for UTI No other details are known On my arrival, pt reports "I am very upset" and where is "Sheryle Hail"  Past Medical History  Diagnosis Date  . Diabetes mellitus   . Chronic diastolic CHF (congestive heart failure) (HCC)   . Pulmonary embolus (HCC) 03-23-11    a. 1978 and 2012.  . Arthritis     left leg  . UTI (lower urinary tract infection)   . Hypothyroidism   . Permanent atrial fibrillation (HCC)   . Glaucoma     a. with significantly decreased vision.  . Chronic atrial flutter (HCC)   . Bradycardia     a. 05/2015: HR 40s-50s, occ 30s at night - not clear how much symptoms were r/t this, patient declined PPM at that time.  . CKD (chronic kidney disease), stage III   . Mild dementia   . Moderate to severe pulmonary hypertension (HCC) By echo 05/2015  . RBBB    Past Surgical History  Procedure Laterality Date  . Kisney stone removal  1978    removed thru bladder  . Cystoscopy w/ ureteral stent placement  09/06/2011    Procedure: CYSTOSCOPY WITH RETROGRADE PYELOGRAM/URETERAL STENT PLACEMENT;  Surgeon: Valetta Fuller, MD;  Location: [facility];  Service: Urology;  Laterality: Right;  cysto double j stent placement  . Ureteroscopy  09/06/2011    Procedure:  URETEROSCOPY;  Surgeon: Valetta Fuller, MD;  Location: [facility];  Service: Urology;  Laterality: Right;   Family History  Problem Relation Age of Onset  . Hypertension     Social History  Substance Use Topics  . Smoking status: Never Smoker   . Smokeless tobacco: Never Used  . Alcohol Use: No   OB History    No data available     Review of Systems  Unable to perform ROS: Dementia  Gastrointestinal: Negative for vomiting.  Psychiatric/Behavioral: Positive for confusion.      Allergies  Gemfibrozil; Iohexol; Ivp dye; Bactrim; and Sulfamethoxazole-trimethoprim  Home Medications   Prior to Admission medications   Medication Sig Start Date End Date Taking? Authorizing Provider  atorvastatin (LIPITOR) 20 MG tablet Take 1 tablet (20 mg total) by mouth daily at 6 PM. 08/11/15  Yes Chrystie Nose, MD  beta carotene w/minerals (OCUVITE) tablet Take 1 tablet by mouth daily.   Yes Historical Provider, MD  cyanocobalamin 500 MCG tablet Take 1 tablet (500 mcg total) by mouth daily. 02/08/16  Yes Catarina Hartshorn, MD  donepezil (ARICEPT) 5 MG tablet Take 5 mg by mouth at bedtime.    Yes Historical Provider, MD  furosemide (LASIX) 40 MG tablet Take 1 tablet (40 mg total) by mouth 2 (two) times daily. Patient taking differently: Take 40 mg by mouth daily.  09/10/15  Yes Jessica U Vann, DO  insulin glargine (LANTUS) 100 UNIT/ML injection Inject 0.3 mLs (30 Units total) into the skin 2 (two) times daily. Patient taking differently: Inject 35 Units into the skin at bedtime.  09/10/15  Yes Joseph Art, DO  levothyroxine (SYNTHROID, LEVOTHROID) 50 MCG tablet Take 50 mcg by mouth daily before breakfast.   Yes Historical Provider, MD  traMADol (ULTRAM) 50 MG tablet Take 50 mg by mouth every 6 (six) hours as needed for moderate pain.    Yes Historical Provider, MD  warfarin (COUMADIN) 4 MG tablet Take 2 tablets (8 mg total) by mouth daily. On 02/09/16 and 02/10/16 and then 4mg  daily starting 02/11/16 or as  instructed by coumadin clinic Patient taking differently: Take 6 mg by mouth daily.  02/09/16  Yes Catarina Hartshorn, MD  acetaminophen (TYLENOL) 325 MG tablet Take 2 tablets (650 mg total) by mouth every 6 (six) hours as needed for mild pain (or Fever >/= 101). 09/10/15   Joseph Art, DO  cefUROXime (CEFTIN) 500 MG tablet Take 1 tablet (500 mg total) by mouth 2 (two) times daily with a meal. 02/08/16   Catarina Hartshorn, MD  enoxaparin (LOVENOX) 100 MG/ML injection Inject 1 mL (100 mg total) into the skin every 12 (twelve) hours. 02/08/16   Catarina Hartshorn, MD   BP 155/84 mmHg  Pulse 67  Temp(Src) 97.4 F (36.3 C) (Oral)  Resp 13  SpO2 92% Physical Exam CONSTITUTIONAL: Elderly HEAD: Normocephalic/atraumatic ENMT: Mucous membranes moist NECK: supple no meningeal signs CV: S1/S2 noted, no murmurs/rubs/gallops noted LUNGS: Lungs are clear to auscultation bilaterally, no apparent distress, pt coughs during exam ABDOMEN: soft, nontender, no rebound or guarding, bowel sounds noted throughout abdomen NEURO: Pt is awake/alert moves all extremitiesx4.  No facial droop.  She appears confused.  She is talking to people in the room who are not present EXTREMITIES: pulses normal/equal, full ROM SKIN: warm, color normal PSYCH: unable to assess  ED Course  Procedures  Labs Review Labs Reviewed  BASIC METABOLIC PANEL - Abnormal; Notable for the following:    Glucose, Bld 189 (*)    GFR calc non Af Amer 54 (*)    All other components within normal limits  CBC WITH DIFFERENTIAL/PLATELET - Abnormal; Notable for the following:    WBC 12.8 (*)    RBC 5.71 (*)    Hemoglobin 15.3 (*)    MCV 75.5 (*)    RDW 15.9 (*)    Neutro Abs 8.9 (*)    Monocytes Absolute 1.2 (*)    All other components within normal limits  PROTIME-INR - Abnormal; Notable for the following:    Prothrombin Time 17.1 (*)    All other components within normal limits  URINALYSIS, ROUTINE W REFLEX MICROSCOPIC (NOT AT [facility]) - Abnormal; Notable for  the following:    Protein, ur 30 (*)    All other components within normal limits  URINE MICROSCOPIC-ADD ON - Abnormal; Notable for the following:    Squamous Epithelial / LPF 0-5 (*)    Bacteria, UA RARE (*)    All other components within normal limits  URINE CULTURE    Imaging Review Dg Chest Portable 1 View  03/16/2016  CLINICAL DATA:  Worsening confusion weakness. Right chest and shoulder pain. EXAM: PORTABLE CHEST 1 VIEW COMPARISON:  02/04/2016 and 06/11/2015 FINDINGS: The patient is partially rotated to the right. Heart size is stable. Low lung volumes again seen. Coarsening of interstitial lung markings again seen bilaterally,,  likely due to chronic interstitial lung disease. No evidence of superimposed pulmonary consolidation or pleural effusion. No pneumothorax visualized. IMPRESSION: Low lung volumes and probable chronic interstitial lung disease, without significant change compared to prior studies. No acute findings identified. Electronically Signed   By: Myles Rosenthal M.D.   On: 03/16/2016 11:47   I have personally reviewed and evaluated these images and lab results as part of my medical decision-making.   EKG Interpretation   Date/Time:  Tuesday March 16 2016 11:09:33 EDT Ventricular Rate:  68 PR Interval:    QRS Duration: 151 QT Interval:  454 QTC Calculation: 483 R Axis:   -70 Text Interpretation:  Atrial flutter with predominant 3:1 AV block Right  bundle branch block LVH with IVCD and secondary repol abnrm No significant  change since last tracing Confirmed by Bebe Shaggy  MD, Roshanna Cimino ([id number]) on  03/16/2016 11:27:21 AM     4:45 PM Pt here for altered mental status Initial concern for UTI, however no UTI noted I spoke to daughter via phone Annice Pih) She reports over past day she has had more confusion, she is hallucinating She reports she is not at baseline and she can not be managed at home CT head ordered Otherwise labs thus far unremarkable  At signout to dr Clayborne Dana,  f/u on CT imaging then admit Pt resting comfortably but still confused  MDM   Final diagnoses:  None    Nursing notes including past medical history and social history reviewed and considered in documentation Labs/vital reviewed myself and considered during evaluation xrays/imaging reviewed by myself and considered during evaluation     Zadie Rhine, MD 03/16/16 1648

## 2016-03-16 NOTE — Progress Notes (Signed)
ANTICOAGULATION CONSULT NOTE - Initial Consult  Pharmacy Consult for Warfarin Indication: atrial fibrillation  Allergies  Allergen Reactions  . Gemfibrozil Nausea And Vomiting and Other (See Comments)    Sick and give urine infection  . Iohexol Itching  . Ivp Dye [Iodinated Diagnostic Agents] Itching    itching  . Bactrim Itching, Nausea And Vomiting and Rash    And diarrhea  . Sulfamethoxazole-Trimethoprim Itching, Nausea And Vomiting and Rash    And diarrhea    Patient Measurements: Weight: 206 lb 5.6 oz (93.6 kg)  Vital Signs: Temp: 98.1 F (36.7 C) (06/20 2016) Temp Source: Oral (06/20 2016) BP: 165/51 mmHg (06/20 2016) Pulse Rate: 52 (06/20 2016)  Labs:  Recent Labs  03/16/16 1216  HGB 15.3*  HCT 43.1  PLT 272  LABPROT 17.1*  INR 1.39  CREATININE 0.91    Estimated Creatinine Clearance: 47.3 mL/min (by C-G formula based on Cr of 0.91).   Medical History: Past Medical History  Diagnosis Date  . Diabetes mellitus   . Chronic diastolic CHF (congestive heart failure) (HCC)   . Pulmonary embolus (HCC) 03-23-11    a. 1978 and 2012.  . Arthritis     left leg  . UTI (lower urinary tract infection)   . Hypothyroidism   . Permanent atrial fibrillation (HCC)   . Glaucoma     a. with significantly decreased vision.  . Chronic atrial flutter (HCC)   . Bradycardia     a. 05/2015: HR 40s-50s, occ 30s at night - not clear how much symptoms were r/t this, patient declined PPM at that time.  . CKD (chronic kidney disease), stage III   . Mild dementia   . Moderate to severe pulmonary hypertension (HCC) By echo 05/2015  . RBBB      Assessment: 89 yoF on chronic warfarin for atrial fibrillation (CHADSVASC 4) admitted with AMS. INR subtherapeutic on admission. Pharmacy consulted to manage warfarin inpatient.  Patient recently admitted 5/10-5/14 for acute metabolic encephalopathy 22/ UTI, dehydration and was continued on warfarin during that admission but was unable  to get INR into therapeutic range despite higher doses.  Lovenox 1 mg/kg q12h ordered while INR subtherapeutic.  Home warfarin dose reported as 6 mg daily. LD 6/19 at 1930. Hgb, platelets ok.  Goal of Therapy:  INR 2-3   Plan:  Warfarin 10 mg x 1 tonight. Continue Lovenox 1 mg/kg q12h while INR subtherapeutic. Daily INR.  Clance Boll 03/16/2016,8:37 PM

## 2016-03-17 ENCOUNTER — Encounter (HOSPITAL_COMMUNITY): Payer: Self-pay | Admitting: Internal Medicine

## 2016-03-17 DIAGNOSIS — D689 Coagulation defect, unspecified: Secondary | ICD-10-CM

## 2016-03-17 DIAGNOSIS — D72829 Elevated white blood cell count, unspecified: Secondary | ICD-10-CM | POA: Diagnosis not present

## 2016-03-17 DIAGNOSIS — T45511A Poisoning by anticoagulants, accidental (unintentional), initial encounter: Secondary | ICD-10-CM

## 2016-03-17 DIAGNOSIS — F05 Delirium due to known physiological condition: Principal | ICD-10-CM

## 2016-03-17 LAB — BASIC METABOLIC PANEL
Anion gap: 7 (ref 5–15)
BUN: 18 mg/dL (ref 6–20)
CALCIUM: 9.5 mg/dL (ref 8.9–10.3)
CHLORIDE: 107 mmol/L (ref 101–111)
CO2: 25 mmol/L (ref 22–32)
CREATININE: 0.9 mg/dL (ref 0.44–1.00)
GFR calc Af Amer: >60 mL/min (ref >60)
GFR calc non Af Amer: 54 mL/min — ABNORMAL LOW (ref >60)
Glucose, Bld: 151 mg/dL — ABNORMAL HIGH (ref 65–99)
Potassium: 4.3 mmol/L (ref 3.5–5.1)
SODIUM: 139 mmol/L (ref 135–145)

## 2016-03-17 LAB — GLUCOSE, CAPILLARY
Glucose-Capillary: 160 mg/dL — ABNORMAL HIGH (ref 65–99)
Glucose-Capillary: 92 mg/dL (ref 65–99)

## 2016-03-17 LAB — URINE CULTURE: Culture: NO GROWTH

## 2016-03-17 LAB — PROTIME-INR
INR: 1.38 (ref 0.00–1.49)
Prothrombin Time: 17 seconds — ABNORMAL HIGH (ref 11.6–15.2)

## 2016-03-17 MED ORDER — ENOXAPARIN SODIUM 100 MG/ML ~~LOC~~ SOLN
95.0000 mg | Freq: Two times a day (BID) | SUBCUTANEOUS | Status: DC
  Administered 2016-03-17: 95 mg via SUBCUTANEOUS
  Filled 2016-03-17: qty 1

## 2016-03-17 MED ORDER — FUROSEMIDE 40 MG PO TABS: 40.0000 mg | ORAL_TABLET | Freq: Every day | ORAL | Status: DC

## 2016-03-17 MED ORDER — WARFARIN SODIUM 5 MG PO TABS: 7.5000 mg | ORAL_TABLET | Freq: Once | ORAL | Status: DC

## 2016-03-17 NOTE — Progress Notes (Signed)
Initial Nutrition Assessment  DOCUMENTATION CODES:   Not applicable  INTERVENTION:  -Glucerna Shake po TID, each supplement provides 220 kcal and 10 grams of protein -Pt to D/C today, summary is in  NUTRITION DIAGNOSIS:   Inadequate oral intake related to poor appetite, lethargy/confusion as evidenced by per patient/family report, percent weight loss.  GOAL:   Patient will meet greater than or equal to 90% of their needs  MONITOR:   PO intake, Labs, I & O's, Skin  REASON FOR ASSESSMENT:   Malnutrition Screening Tool    ASSESSMENT:   Sara Dennis is a 82 y.o. female with medical history significant of dementia presenting with worsening confusion since last night. About 2100 last evening she acutely started "talking our her head, talking crazy", hallucinating. She didn't sleep at all, was agitated, and has refused to eat/drink for the last 24 hours. She was confused and wouldn't allow her daughter to change her adult Depends  Spoke with Sara Dennis at bedside, she presented with delirium.  Nutrition-Focused physical exam completed. Findings are no fat depletion, no muscle depletion, and no edema.   Per chart Pt exhibits a 24#/10% severe wt loss in 1 month.  Pt endorses 27# wt loss since January and is slowly gaining weight back.  PO intake at home is good per patient -> does 3-4 meals per day. She states she did have some issue with appetite "in the past" likely culprit of endorsed 27# wt loss since January and current wt loss. Pt does state she is regaining the weight she lost -> documented PO intake 50% from yesterday. She did not have breakfast this morning -> was asleep. Apparently patient is difficult to wake along with delirium and hallucinations.  She is discharging today, daughter is signing discharge orders as I write this note.  Labs and Medications reviewed.   Diet Order:  Diet heart healthy/carb modified Room service appropriate?: Yes; Fluid consistency::  Thin Diet - low sodium heart healthy  Skin:  Reviewed, no issues  Last BM:  PTA  Height:   Ht Readings from Last 1 Encounters:  03/16/16 5\' 4"  (1.626 m)    Weight:   Wt Readings from Last 1 Encounters:  03/16/16 206 lb 5.6 oz (93.6 kg)    Ideal Body Weight:  54.54 kg  BMI:  Body mass index is 35.4 kg/(m^2).  Estimated Nutritional Needs:   Kcal:  1600-1900 calories  Protein:  60-75 grams  Fluid:  >/= 1.6 L  EDUCATION NEEDS:   No education needs identified at this time  Sara Ano. [name], MS, RD LDN Inpatient Clinical Dietitian Pager [phone]

## 2016-03-17 NOTE — Discharge Summary (Signed)
Physician Discharge Summary  Sara Dennis AST:[id number] DOB: 29-May-1922 DOA: 03/16/2016  PCP: Coralie Common, MD  Admit date: 03/16/2016 Discharge date: 03/17/2016  Admitted From: Home Disposition:  Home  Recommendations for Outpatient Follow-up:  1. Follow up with PCP in 1-2 weeks 2. Please obtain BMP/CBC in one week. 3. Palliative to meet with family at home.   Home HealthYes   Discharge Condition:Guarded CODE STATUS:Previously Full Diet recommendation: Heart Healthy  Brief/Interim Summary: 82 year old female with significant past medical history of dementia comes in for worsening confusion that started the day of admission she was hallucinating and she couldn't sleep so her family brought her into the ED.  Discharge Diagnoses:  Delirium due to another medical condition, acute, mixed level of activity Initially she was hyperactive which when she was seen in the ED she was hypoactive when evaluated by the admitting physician, she has had no new medications, antibiotics were not initiated in the ED as she remained afebrile. Blood cultures and urine cultures were negative. CT scan of the head showed no acute infarct and she was nonfocal and physical exam. By the next day her thinking and mentation were back to baseline. We spoke with the daughter and she relates there were no new medications or changes in her medication. He had a mild elevation in her hematocrit, so probably her delirium was probably due to dehydration her Lasix dose was decreased. Follow-up with her primary care doctor in 2 weeks check a CBC and a.  Leukocytosis: Has remained afebrile, Probably stress emargination she remained afebrile, urine cultures and blood cultures were negative.  Diabetes mellitus (HCC) without complications: Continue current regimen of Lantus. No changes made to her medication.  Warfarin-induced coagulopathy [facility]): INR subtherapeutic, CHADS2-VASC   Discharge Instructions       Discharge Instructions    Call MD for:  temperature >100.4    Complete by:  As directed      Diet - low sodium heart healthy    Complete by:  As directed      Increase activity slowly    Complete by:  As directed             Medication List    TAKE these medications        acetaminophen 325 MG tablet  Commonly known as:  TYLENOL  Take 2 tablets (650 mg total) by mouth every 6 (six) hours as needed for mild pain (or Fever >/= 101).     atorvastatin 20 MG tablet  Commonly known as:  LIPITOR  Take 1 tablet (20 mg total) by mouth daily at 6 PM.     beta carotene w/minerals tablet  Take 1 tablet by mouth daily.     cyanocobalamin 500 MCG tablet  Take 1 tablet (500 mcg total) by mouth daily.     donepezil 5 MG tablet  Commonly known as:  ARICEPT  Take 5 mg by mouth at bedtime.     enoxaparin 100 MG/ML injection  Commonly known as:  LOVENOX  Inject 1 mL (100 mg total) into the skin every 12 (twelve) hours.     furosemide 40 MG tablet  Commonly known as:  LASIX  Take 1 tablet (40 mg total) by mouth daily.     insulin glargine 100 UNIT/ML injection  Commonly known as:  LANTUS  Inject 0.3 mLs (30 Units total) into the skin 2 (two) times daily.     levothyroxine 50 MCG tablet  Commonly known as:  SYNTHROID, LEVOTHROID  Take  50 mcg by mouth daily before breakfast.     traMADol 50 MG tablet  Commonly known as:  ULTRAM  Take 50 mg by mouth every 6 (six) hours as needed for moderate pain.     warfarin 4 MG tablet  Commonly known as:  COUMADIN  Take 2 tablets (8 mg total) by mouth daily. On 02/09/16 and 02/10/16 and then 4mg  daily starting 02/11/16 or as instructed by coumadin clinic       Follow-up Information    Follow up with HINES,MARK C, MD In 2 weeks.   Specialty:  General Practice   Why:  follow up with PCP   Contact information:   [address] [place] [address] [phone]      Allergies  Allergen Reactions  . Gemfibrozil Nausea And  Vomiting and Other (See Comments)    Sick and give urine infection  . Iohexol Itching  . Ivp Dye [Iodinated Diagnostic Agents] Itching    itching  . Bactrim Itching, Nausea And Vomiting and Rash    And diarrhea  . Sulfamethoxazole-Trimethoprim Itching, Nausea And Vomiting and Rash    And diarrhea    Consultations:  none   Procedures/Studies: Ct Head Wo Contrast  03/16/2016  CLINICAL DATA:  History of dementia with altered mental status today EXAM: CT HEAD WITHOUT CONTRAST TECHNIQUE: Contiguous axial images were obtained from the base of the skull through the vertex without intravenous contrast. COMPARISON:  MRI 02/05/2016, CT head 02/04/2016 FINDINGS: Stable increased attenuation in the posterior compartment of the right eye when compared to 02/04/2016. This is not specific but could represent retinal detachment. Diffuse atrophy is again identified. There is low attenuation throughout the deep white matter as previously described. Tiny lacunar infarct left thalamus again noted. There is no evidence of acute vascular territory infarct or mass. There is no intracranial hemorrhage or extra-axial fluid. There is no hydrocephalus. Calvarium is intact. IMPRESSION: No acute findings. Stable increased attenuation posterior compartment of the right eye stable from 02/04/2016 but new from 2008. See above comments and correlate with ophthalmological examination. Electronically Signed   By: Esperanza Heir M.D.   On: 03/16/2016 17:51   Dg Chest Portable 1 View  03/16/2016  CLINICAL DATA:  Worsening confusion weakness. Right chest and shoulder pain. EXAM: PORTABLE CHEST 1 VIEW COMPARISON:  02/04/2016 and 06/11/2015 FINDINGS: The patient is partially rotated to the right. Heart size is stable. Low lung volumes again seen. Coarsening of interstitial lung markings again seen bilaterally,, likely due to chronic interstitial lung disease. No evidence of superimposed pulmonary consolidation or pleural effusion. No  pneumothorax visualized. IMPRESSION: Low lung volumes and probable chronic interstitial lung disease, without significant change compared to prior studies. No acute findings identified. Electronically Signed   By: Myles Rosenthal M.D.   On: 03/16/2016 11:47      Subjective:   Discharge Exam: Filed Vitals:   03/17/16 0045 03/17/16 0528  BP: 152/67 129/77  Pulse: 62 57  Temp: 97.6 F (36.4 C) 98.3 F (36.8 C)  Resp: 16 20   Filed Vitals:   03/16/16 1930 03/16/16 2016 03/17/16 0045 03/17/16 0528  BP: 138/61 165/51 152/67 129/77  Pulse: 60 52 62 57  Temp:  98.1 F (36.7 C) 97.6 F (36.4 C) 98.3 F (36.8 C)  TempSrc:  Oral Oral Oral  Resp: 20 18 16 20   Height:  5\' 4"  (1.626 m)    Weight:  93.6 kg (206 lb 5.6 oz)    SpO2: 96%  98% 97% 98%    General: Pt is alert, awake, not in acute distress Cardiovascular: RRR, S1/S2 +, no rubs, no gallops Respiratory: CTA bilaterally, no wheezing, no rhonchi Abdominal: Soft, NT, ND, bowel sounds + Extremities: no edema, no cyanosis    The results of significant diagnostics from this hospitalization (including imaging, microbiology, ancillary and laboratory) are listed below for reference.     Microbiology: No results found for this or any previous visit (from the past 240 hour(s)).   Labs: BNP (last 3 results) No results for input(s): BNP in the last 8760 hours. Basic Metabolic Panel:  Recent Labs Lab 03/16/16 1216 03/17/16 0440  NA 139 139  K 4.1 4.3  CL 105 107  CO2 25 25  GLUCOSE 189* 151*  BUN 16 18  CREATININE 0.91 0.90  CALCIUM 9.5 9.5   Liver Function Tests: No results for input(s): AST, ALT, ALKPHOS, BILITOT, PROT, ALBUMIN in the last 168 hours. No results for input(s): LIPASE, AMYLASE in the last 168 hours. No results for input(s): AMMONIA in the last 168 hours. CBC:  Recent Labs Lab 03/16/16 1216  WBC 12.8*  NEUTROABS 8.9*  HGB 15.3*  HCT 43.1  MCV 75.5*  PLT 272   Cardiac Enzymes: No results for  input(s): CKTOTAL, CKMB, CKMBINDEX, TROPONINI in the last 168 hours. BNP: Invalid input(s): POCBNP CBG:  Recent Labs Lab 03/16/16 2150 03/17/16 0734  GLUCAP 168* 160*   D-Dimer No results for input(s): DDIMER in the last 72 hours. Hgb A1c No results for input(s): HGBA1C in the last 72 hours. Lipid Profile No results for input(s): CHOL, HDL, LDLCALC, TRIG, CHOLHDL, LDLDIRECT in the last 72 hours. Thyroid function studies No results for input(s): TSH, T4TOTAL, T3FREE, THYROIDAB in the last 72 hours.  Invalid input(s): FREET3 Anemia work up No results for input(s): VITAMINB12, FOLATE, FERRITIN, TIBC, IRON, RETICCTPCT in the last 72 hours. Urinalysis    Component Value Date/Time   COLORURINE YELLOW 03/16/2016 1149   APPEARANCEUR CLEAR 03/16/2016 1149   LABSPEC 1.014 03/16/2016 1149   PHURINE 6.5 03/16/2016 1149   GLUCOSEU NEGATIVE 03/16/2016 1149   HGBUR NEGATIVE 03/16/2016 1149   BILIRUBINUR NEGATIVE 03/16/2016 1149   KETONESUR NEGATIVE 03/16/2016 1149   PROTEINUR 30* 03/16/2016 1149   UROBILINOGEN 0.2 06/13/2015 1105   NITRITE NEGATIVE 03/16/2016 1149   LEUKOCYTESUR NEGATIVE 03/16/2016 1149   Sepsis Labs Invalid input(s): PROCALCITONIN,  WBC,  LACTICIDVEN Microbiology No results found for this or any previous visit (from the past 240 hour(s)).   Time coordinating discharge: Over 30 minutes  SIGNED:   Marinda Elk, MD  [facility] 03/17/2016, 8:53 AM Pager   If 7PM-7AM, please contact night-coverage [email or web address] Password [email or web address]

## 2016-03-17 NOTE — Care Management Obs Status (Signed)
MEDICARE OBSERVATION STATUS NOTIFICATION   Patient Details  Name: Sara Dennis MRN: [id number] Date of Birth: 1922/01/16   Medicare Observation Status Notification Given:  Yes    MahabirOlegario Messier, RN 03/17/2016, 9:28 AM

## 2016-03-17 NOTE — Progress Notes (Signed)
ANTICOAGULATION CONSULT NOTE - follow-up  Pharmacy Consult for Warfarin Indication: atrial fibrillation  Allergies  Allergen Reactions  . Gemfibrozil Nausea And Vomiting and Other (See Comments)    Sick and give urine infection  . Iohexol Itching  . Ivp Dye [Iodinated Diagnostic Agents] Itching    itching  . Bactrim Itching, Nausea And Vomiting and Rash    And diarrhea  . Sulfamethoxazole-Trimethoprim Itching, Nausea And Vomiting and Rash    And diarrhea    Patient Measurements: Height: 5\' 4"  (162.6 cm) Weight: 206 lb 5.6 oz (93.6 kg) IBW/kg (Calculated) : 54.7  Vital Signs: Temp: 98.3 F (36.8 C) (06/21 0528) Temp Source: Oral (06/21 0528) BP: 129/77 mmHg (06/21 0528) Pulse Rate: 57 (06/21 0528)  Labs:  Recent Labs  03/16/16 1216 03/17/16 0440  HGB 15.3*  --   HCT 43.1  --   PLT 272  --   LABPROT 17.1* 17.0*  INR 1.39 1.38  CREATININE 0.91 0.90    Estimated Creatinine Clearance: 45.2 mL/min (by C-G formula based on Cr of 0.9).   Assessment: 89 yoF on chronic warfarin for atrial fibrillation (CHADSVASC 4) admitted with AMS. INR subtherapeutic on admission. Pharmacy consulted to manage warfarin inpatient.  Patient recently admitted 5/10-5/14 for acute metabolic encephalopathy d/t UTI, dehydration and was continued on warfarin during that admission but was unable to get INR into therapeutic range despite higher doses.  Lovenox 1 mg/kg q12h ordered while INR subtherapeutic.  Home warfarin dose reported as 6 mg daily. LD 6/19 at 1930.  Today, 03/17/2016:   INR remains subtherapeutic, given boosted warfarin dose last eveningd  No new CBC this am  No major drug-drug interactions  Heart healthy/Carb modified diet  Goal of Therapy:  INR 2-3   Plan:   Warfarin 7.5mg  x 1 tonight.  Continue Lovenox 1 mg/kg q12h (adjust dose to 95mg ) while INR subtherapeutic.  Daily INR and CBC at least q72h while on therapeutic dose enoxaparin in hospital  Juliette Alcide, PharmD, BCPS.   Pager: [phone] 03/17/2016 8:14 AM

## 2016-03-17 NOTE — Care Management Note (Signed)
Case Management Note  Patient Details  Name: LEONARDO THORNBERG MRN: [id number] Date of Birth: 04/09/1922  Subjective/Objective:  Spoke to Dtr-Jackie-from home w/[facility]-d/c plan return home w/same services.TC [facility] rep Mary-active w/[facility] HHRN/PT/OT. MD ordered HHRN/PT. [facility] non emergency services-forms in shadow chart-confirmed address.Nsg to call [facility] when ready.No further d/c needs.                  Action/Plan:d/c home w/HHC/[facility].   Expected Discharge Date:   (unknown)               Expected Discharge Plan:  Home w Home Health Services  In-House Referral:     Discharge planning Services  CM Consult  Post Acute Care Choice:  Home Health ([facility]-HHRN/PT/OT) Choice offered to:  Adult Children  DME Arranged:    DME Agency:     HH Arranged:  RN, PT HH Agency:     Status of Service:  Completed, signed off  If discussed at Long Length of Stay Meetings, dates discussed:    Additional Comments:  Lanier Clam, RN 03/17/2016, 9:50 AM

## 2016-03-17 NOTE — Progress Notes (Signed)
.  Reviewed discharge information with patient and caregiver. Answered all questions. Patient/caregiver able to teach back medications and reasons to contact MD/911. Patient verbalizes importance of PCP follow up appointment.  [facility] called for transportation.  Sara Dennis. Clelia Croft, RN

## 2016-03-17 NOTE — Progress Notes (Signed)
Patient's heart rate have been between 50s and 60s occasionally 40-30s but does not sustaining, and down to  29 one time, Notified  K. Craige Cotta, will continue assess patient

## 2016-03-17 NOTE — Progress Notes (Signed)
Patient had pause of 2.06, patient in the no distress, in bed sleeping.  Kirby-NP paged, waiting on a call back

## 2016-03-21 LAB — CULTURE, BLOOD (ROUTINE X 2)
CULTURE: NO GROWTH
CULTURE: NO GROWTH

## 2017-10-18 ENCOUNTER — Encounter (HOSPITAL_COMMUNITY): Payer: Self-pay | Admitting: Emergency Medicine

## 2017-10-18 ENCOUNTER — Other Ambulatory Visit: Payer: Self-pay

## 2017-10-18 ENCOUNTER — Inpatient Hospital Stay (HOSPITAL_COMMUNITY)
Admission: EM | Admit: 2017-10-18 | Discharge: 2017-10-24 | DRG: 291 | Disposition: A | Discharge status: Home / Self Care | Payer: Medicare HMO | Attending: Internal Medicine | Admitting: Internal Medicine

## 2017-10-18 DIAGNOSIS — I130 Hypertensive heart and chronic kidney disease with heart failure and stage 1 through stage 4 chronic kidney disease, or unspecified chronic kidney disease: Secondary | ICD-10-CM | POA: Diagnosis not present

## 2017-10-18 DIAGNOSIS — Z79899 Other long term (current) drug therapy: Secondary | ICD-10-CM

## 2017-10-18 DIAGNOSIS — E1122 Type 2 diabetes mellitus with diabetic chronic kidney disease: Secondary | ICD-10-CM | POA: Diagnosis present

## 2017-10-18 DIAGNOSIS — I4510 Unspecified right bundle-branch block: Secondary | ICD-10-CM | POA: Diagnosis present

## 2017-10-18 DIAGNOSIS — R05 Cough: Secondary | ICD-10-CM

## 2017-10-18 DIAGNOSIS — I5033 Acute on chronic diastolic (congestive) heart failure: Secondary | ICD-10-CM | POA: Diagnosis present

## 2017-10-18 DIAGNOSIS — Z66 Do not resuscitate: Secondary | ICD-10-CM | POA: Diagnosis present

## 2017-10-18 DIAGNOSIS — Z888 Allergy status to other drugs, medicaments and biological substances status: Secondary | ICD-10-CM

## 2017-10-18 DIAGNOSIS — J9601 Acute respiratory failure with hypoxia: Secondary | ICD-10-CM | POA: Diagnosis present

## 2017-10-18 DIAGNOSIS — T380X5A Adverse effect of glucocorticoids and synthetic analogues, initial encounter: Secondary | ICD-10-CM | POA: Diagnosis not present

## 2017-10-18 DIAGNOSIS — R059 Cough, unspecified: Secondary | ICD-10-CM

## 2017-10-18 DIAGNOSIS — Z8249 Family history of ischemic heart disease and other diseases of the circulatory system: Secondary | ICD-10-CM

## 2017-10-18 DIAGNOSIS — N183 Chronic kidney disease, stage 3 (moderate): Secondary | ICD-10-CM | POA: Diagnosis present

## 2017-10-18 DIAGNOSIS — I482 Chronic atrial fibrillation: Secondary | ICD-10-CM | POA: Diagnosis present

## 2017-10-18 DIAGNOSIS — N390 Urinary tract infection, site not specified: Secondary | ICD-10-CM | POA: Diagnosis present

## 2017-10-18 DIAGNOSIS — Z79890 Hormone replacement therapy (postmenopausal): Secondary | ICD-10-CM

## 2017-10-18 DIAGNOSIS — H409 Unspecified glaucoma: Secondary | ICD-10-CM | POA: Diagnosis present

## 2017-10-18 DIAGNOSIS — Z794 Long term (current) use of insulin: Secondary | ICD-10-CM

## 2017-10-18 DIAGNOSIS — I509 Heart failure, unspecified: Secondary | ICD-10-CM

## 2017-10-18 DIAGNOSIS — Z91041 Radiographic dye allergy status: Secondary | ICD-10-CM

## 2017-10-18 DIAGNOSIS — E039 Hypothyroidism, unspecified: Secondary | ICD-10-CM | POA: Diagnosis present

## 2017-10-18 DIAGNOSIS — Z882 Allergy status to sulfonamides status: Secondary | ICD-10-CM

## 2017-10-18 DIAGNOSIS — E669 Obesity, unspecified: Secondary | ICD-10-CM | POA: Diagnosis present

## 2017-10-18 DIAGNOSIS — Z6832 Body mass index (BMI) 32.0-32.9, adult: Secondary | ICD-10-CM

## 2017-10-18 DIAGNOSIS — I5031 Acute diastolic (congestive) heart failure: Secondary | ICD-10-CM

## 2017-10-18 DIAGNOSIS — E119 Type 2 diabetes mellitus without complications: Secondary | ICD-10-CM

## 2017-10-18 DIAGNOSIS — N179 Acute kidney failure, unspecified: Secondary | ICD-10-CM | POA: Diagnosis present

## 2017-10-18 DIAGNOSIS — B961 Klebsiella pneumoniae [K. pneumoniae] as the cause of diseases classified elsewhere: Secondary | ICD-10-CM | POA: Diagnosis present

## 2017-10-18 DIAGNOSIS — Z86711 Personal history of pulmonary embolism: Secondary | ICD-10-CM

## 2017-10-18 DIAGNOSIS — I4892 Unspecified atrial flutter: Secondary | ICD-10-CM | POA: Diagnosis present

## 2017-10-18 DIAGNOSIS — M1990 Unspecified osteoarthritis, unspecified site: Secondary | ICD-10-CM | POA: Diagnosis present

## 2017-10-18 DIAGNOSIS — E118 Type 2 diabetes mellitus with unspecified complications: Secondary | ICD-10-CM

## 2017-10-18 DIAGNOSIS — Z7901 Long term (current) use of anticoagulants: Secondary | ICD-10-CM

## 2017-10-18 DIAGNOSIS — F0390 Unspecified dementia without behavioral disturbance: Secondary | ICD-10-CM | POA: Diagnosis present

## 2017-10-18 HISTORY — DX: Dyspnea, unspecified: R06.00

## 2017-10-18 LAB — CBC WITH DIFFERENTIAL/PLATELET
Basophils Absolute: 0.1 10*3/uL (ref 0.0–0.1)
Basophils Relative: 1 %
EOS PCT: 1 %
Eosinophils Absolute: 0.1 10*3/uL (ref 0.0–0.7)
HCT: 45 % (ref 36.0–46.0)
Hemoglobin: 15.2 g/dL — ABNORMAL HIGH (ref 12.0–15.0)
LYMPHS ABS: 1.8 10*3/uL (ref 0.7–4.0)
LYMPHS PCT: 15 %
MCH: 26.1 pg (ref 26.0–34.0)
MCHC: 33.8 g/dL (ref 30.0–36.0)
MCV: 77.3 fL — AB (ref 78.0–100.0)
MONOS PCT: 14 %
Monocytes Absolute: 1.8 10*3/uL — ABNORMAL HIGH (ref 0.1–1.0)
NEUTROS PCT: 69 %
Neutro Abs: 8.8 10*3/uL — ABNORMAL HIGH (ref 1.7–7.7)
Platelets: 246 10*3/uL (ref 150–400)
RBC: 5.82 MIL/uL — AB (ref 3.87–5.11)
RDW: 16.7 % — ABNORMAL HIGH (ref 11.5–15.5)
WBC: 12.5 10*3/uL — AB (ref 4.0–10.5)

## 2017-10-18 NOTE — ED Triage Notes (Signed)
Patient arrived via [facility] from Home. Family reports that patient has gad a cough for 2 weeks. PT is A&O X4.

## 2017-10-18 NOTE — ED Triage Notes (Signed)
[facility] gave patient duoneb and [facility] was at patient's side before EMS. They gave her duoneb

## 2017-10-19 ENCOUNTER — Emergency Department (HOSPITAL_COMMUNITY): Payer: Medicare HMO

## 2017-10-19 ENCOUNTER — Observation Stay (HOSPITAL_BASED_OUTPATIENT_CLINIC_OR_DEPARTMENT_OTHER): Payer: Medicare HMO

## 2017-10-19 DIAGNOSIS — I361 Nonrheumatic tricuspid (valve) insufficiency: Secondary | ICD-10-CM

## 2017-10-19 DIAGNOSIS — J9601 Acute respiratory failure with hypoxia: Secondary | ICD-10-CM | POA: Diagnosis present

## 2017-10-19 DIAGNOSIS — I5033 Acute on chronic diastolic (congestive) heart failure: Secondary | ICD-10-CM | POA: Diagnosis present

## 2017-10-19 DIAGNOSIS — I5031 Acute diastolic (congestive) heart failure: Secondary | ICD-10-CM | POA: Diagnosis not present

## 2017-10-19 DIAGNOSIS — N179 Acute kidney failure, unspecified: Secondary | ICD-10-CM | POA: Diagnosis not present

## 2017-10-19 DIAGNOSIS — E118 Type 2 diabetes mellitus with unspecified complications: Secondary | ICD-10-CM | POA: Diagnosis not present

## 2017-10-19 DIAGNOSIS — R05 Cough: Secondary | ICD-10-CM

## 2017-10-19 LAB — URINALYSIS, ROUTINE W REFLEX MICROSCOPIC
Bilirubin Urine: NEGATIVE
GLUCOSE, UA: 150 mg/dL — AB
KETONES UR: NEGATIVE mg/dL
NITRITE: NEGATIVE
PH: 5 (ref 5.0–8.0)
Protein, ur: 100 mg/dL — AB
Specific Gravity, Urine: 1.014 (ref 1.005–1.030)

## 2017-10-19 LAB — COMPREHENSIVE METABOLIC PANEL
ALBUMIN: 3 g/dL — AB (ref 3.5–5.0)
ALT: 26 U/L (ref 14–54)
ANION GAP: 13 (ref 5–15)
AST: 45 U/L — ABNORMAL HIGH (ref 15–41)
Alkaline Phosphatase: 82 U/L (ref 38–126)
BUN: 18 mg/dL (ref 6–20)
CHLORIDE: 96 mmol/L — AB (ref 101–111)
CO2: 24 mmol/L (ref 22–32)
Calcium: 8.6 mg/dL — ABNORMAL LOW (ref 8.9–10.3)
Creatinine, Ser: 1.35 mg/dL — ABNORMAL HIGH (ref 0.44–1.00)
GFR calc non Af Amer: 33 mL/min — ABNORMAL LOW (ref >60)
GFR, EST AFRICAN AMERICAN: 38 mL/min — AB (ref >60)
GLUCOSE: 258 mg/dL — AB (ref 65–99)
POTASSIUM: 4.4 mmol/L (ref 3.5–5.1)
SODIUM: 133 mmol/L — AB (ref 135–145)
Total Bilirubin: 1.1 mg/dL (ref 0.3–1.2)
Total Protein: 8 g/dL (ref 6.5–8.1)

## 2017-10-19 LAB — ECHOCARDIOGRAM COMPLETE
Height: 67 in
Weight: 3403.9 oz

## 2017-10-19 LAB — GLUCOSE, CAPILLARY
GLUCOSE-CAPILLARY: 195 mg/dL — AB (ref 65–99)
GLUCOSE-CAPILLARY: 184 mg/dL — AB (ref 65–99)
Glucose-Capillary: 150 mg/dL — ABNORMAL HIGH (ref 65–99)
Glucose-Capillary: 183 mg/dL — ABNORMAL HIGH (ref 65–99)

## 2017-10-19 LAB — PROTIME-INR
INR: 2.34
INR: 2.43
PROTHROMBIN TIME: 26.2 s — AB (ref 11.4–15.2)
Prothrombin Time: 25.4 seconds — ABNORMAL HIGH (ref 11.4–15.2)

## 2017-10-19 MED ORDER — LEVOTHYROXINE SODIUM 50 MCG PO TABS
50.0000 ug | ORAL_TABLET | Freq: Every day | ORAL | Status: DC
  Administered 2017-10-20 – 2017-10-24 (×5): 50 ug via ORAL
  Filled 2017-10-19 (×6): qty 1

## 2017-10-19 MED ORDER — ACETAMINOPHEN 325 MG PO TABS: 650.0000 mg | ORAL_TABLET | Freq: Four times a day (QID) | ORAL | Status: DC | PRN

## 2017-10-19 MED ORDER — WARFARIN - PHARMACIST DOSING INPATIENT
Freq: Every day | Status: DC
  Administered 2017-10-20 – 2017-10-23 (×2)

## 2017-10-19 MED ORDER — SODIUM CHLORIDE 0.9% FLUSH: 3.0000 mL | INTRAVENOUS | Status: DC | PRN

## 2017-10-19 MED ORDER — FUROSEMIDE 10 MG/ML IJ SOLN
40.0000 mg | Freq: Once | INTRAMUSCULAR | Status: AC
  Administered 2017-10-19: 40 mg via INTRAVENOUS

## 2017-10-19 MED ORDER — FUROSEMIDE 10 MG/ML IJ SOLN
40.0000 mg | Freq: Two times a day (BID) | INTRAMUSCULAR | Status: DC
  Administered 2017-10-19 – 2017-10-20 (×3): 40 mg via INTRAVENOUS
  Filled 2017-10-19 (×3): qty 4

## 2017-10-19 MED ORDER — FUROSEMIDE 10 MG/ML IJ SOLN
INTRAMUSCULAR | Status: AC
  Filled 2017-10-19: qty 4

## 2017-10-19 MED ORDER — WARFARIN SODIUM 5 MG PO TABS
6.0000 mg | ORAL_TABLET | Freq: Every day | ORAL | Status: DC
  Administered 2017-10-19: 18:00:00 6 mg via ORAL
  Filled 2017-10-19 (×2): qty 1

## 2017-10-19 MED ORDER — SODIUM CHLORIDE 0.9 % IV SOLN
250.0000 mL | INTRAVENOUS | Status: DC | PRN
Start: 2017-10-19 — End: 2017-10-24

## 2017-10-19 MED ORDER — GUAIFENESIN-DM 100-10 MG/5ML PO SYRP
5.0000 mL | ORAL_SOLUTION | ORAL | Status: DC | PRN
  Administered 2017-10-19 – 2017-10-23 (×3): 5 mL via ORAL
  Filled 2017-10-19 (×3): qty 5

## 2017-10-19 MED ORDER — ONDANSETRON HCL 4 MG/2ML IJ SOLN: 4.0000 mg | Freq: Four times a day (QID) | INTRAMUSCULAR | Status: DC | PRN

## 2017-10-19 MED ORDER — WARFARIN SODIUM 5 MG PO TABS
6.0000 mg | ORAL_TABLET | Freq: Once | ORAL | Status: DC
Start: 2017-10-19 — End: 2017-10-19

## 2017-10-19 MED ORDER — ACETAMINOPHEN 325 MG PO TABS: 650.0000 mg | ORAL_TABLET | ORAL | Status: DC | PRN

## 2017-10-19 MED ORDER — GUAIFENESIN ER 600 MG PO TB12
600.0000 mg | ORAL_TABLET | Freq: Two times a day (BID) | ORAL | Status: DC
Start: 2017-10-19 — End: 2017-10-24
  Administered 2017-10-19 – 2017-10-24 (×10): 600 mg via ORAL
  Filled 2017-10-19 (×10): qty 1

## 2017-10-19 MED ORDER — ATORVASTATIN CALCIUM 20 MG PO TABS
20.0000 mg | ORAL_TABLET | Freq: Every day | ORAL | Status: DC
  Administered 2017-10-19 – 2017-10-24 (×6): 20 mg via ORAL
  Filled 2017-10-19 (×7): qty 1

## 2017-10-19 MED ORDER — DONEPEZIL HCL 5 MG PO TABS
5.0000 mg | ORAL_TABLET | Freq: Every day | ORAL | Status: DC
  Administered 2017-10-19 – 2017-10-23 (×6): 5 mg via ORAL
  Filled 2017-10-19 (×7): qty 1

## 2017-10-19 MED ORDER — INSULIN GLARGINE 100 UNIT/ML ~~LOC~~ SOLN
12.0000 [IU] | Freq: Two times a day (BID) | SUBCUTANEOUS | Status: DC
  Administered 2017-10-19 – 2017-10-21 (×6): 12 [IU] via SUBCUTANEOUS
  Filled 2017-10-19 (×7): qty 0.12

## 2017-10-19 MED ORDER — SODIUM CHLORIDE 0.9% FLUSH
3.0000 mL | Freq: Two times a day (BID) | INTRAVENOUS | Status: DC
  Administered 2017-10-19 – 2017-10-23 (×10): 3 mL via INTRAVENOUS

## 2017-10-19 MED ORDER — INSULIN ASPART 100 UNIT/ML ~~LOC~~ SOLN
0.0000 [IU] | Freq: Three times a day (TID) | SUBCUTANEOUS | Status: DC
  Administered 2017-10-19 – 2017-10-20 (×4): 3 [IU] via SUBCUTANEOUS
  Administered 2017-10-20: 11 [IU] via SUBCUTANEOUS
  Administered 2017-10-20: 8 [IU] via SUBCUTANEOUS
  Administered 2017-10-21: 11 [IU] via SUBCUTANEOUS
  Administered 2017-10-21: 15 [IU] via SUBCUTANEOUS
  Administered 2017-10-21: 11 [IU] via SUBCUTANEOUS
  Administered 2017-10-22 (×2): 8 [IU] via SUBCUTANEOUS
  Administered 2017-10-22 – 2017-10-23 (×2): 11 [IU] via SUBCUTANEOUS
  Administered 2017-10-23 (×2): 8 [IU] via SUBCUTANEOUS
  Administered 2017-10-24: 3 [IU] via SUBCUTANEOUS
  Administered 2017-10-24: 8 [IU] via SUBCUTANEOUS
  Administered 2017-10-24: 3 [IU] via SUBCUTANEOUS

## 2017-10-19 NOTE — ED Notes (Signed)
Attempted to ambulate with pulse ox, pt was only able to sit on the edge of the bed without becoming weak and sats dropping to 85-88% RA. Pt was placed in bed and O2 4L CN started. EDP aware.

## 2017-10-19 NOTE — Discharge Instructions (Addendum)
Pulmonary Fibrosis °Pulmonary fibrosis is a type of lung disease that causes scarring. Over time, the scar tissue (fibrosis) builds up in the air sacs of your lungs. This makes it hard for you to breathe. Less oxygen can get into your blood. °Scarring from pulmonary fibrosis gets worse over time. This damage is permanent. Having damaged lungs may make it more likely that you will have heart problems as well. °What are the causes? °Usually, the cause of pulmonary fibrosis is not known (idiopathic pulmonary fibrosis). However, pulmonary fibrosis can be caused by: °· Exposure to occupational and environmental toxins. These include asbestos, silica, and metal dusts. °· Inhaling moldy hay. This can cause an allergic reaction in the lung (farmer's lung) that can lead to pulmonary fibrosis. °· Inhaling toxic fumes. °· Certain medicines. These include drugs used in radiation therapy or used to treat seizures, heart problems, and some infections. °· Autoimmune diseases, such as rheumatoid arthritis, systemic sclerosis, and connective tissue diseases. °· Sarcoidosis. In this disease, areas of inflammatory cells (granulomas) form and most often affect the lungs. °· Genes. Some cases of pulmonary fibrosis may be passed down through families. ° °What increases the risk? °You may be at a higher risk for developing pulmonary fibrosis if: °· You have a family history of the disease. °· You have an autoimmune disease or another condition linked to pulmonary fibrosis. °· You are exposed to certain substances or fumes found in agricultural, farm, construction, or factory work. °· You take certain medicines. ° °What are the signs or symptoms? °Symptoms may include: °· Difficulty breathing that gets worse with activity. °· Dry, hacking cough. °· Rapid, shallow breathing during exercise or while at rest. °· Shortness of breath that gets worse (dyspnea). °· Bluish skin and lips. °· Loss of appetite. °· Loss of strength. °· Weight loss and  fatigue. °· Rounded and enlarged fingertips (clubbing). ° °How is this diagnosed? °Your health care provider may suspect pulmonary fibrosis based on your symptoms and medical history. Diagnosis may include a physical exam. Your health care provider will check for signs that strongly suggest that you have pulmonary fibrosis, such as: °· Blue skin around your fingernails or mouth from reduced oxygen. °· Clubbing around the ends of your fingers. °· A crackling sound when you breathe. ° °Your health care provider may also do tests to confirm the diagnosis. These may include: °· Looking inside your lungs with an instrument (bronchoscopy). °· Imaging studies of your lungs and heart using: °? X-rays. °? CT scan. °? Sound waves (echocardiogram). °· Tests to measure how well you are breathing (pulmonary function tests). °· Exercise testing to see how well your lungs work while you are walking. °· Blood tests. °· A procedure to remove a small piece of lung tissue to examine in a lab (biopsy). ° °How is this treated? °There is no cure for pulmonary fibrosis. Treatments focus on managing symptoms and preventing scarring from getting worse. This can include: °· Medicines. °? You may take steroids to prevent permanent lung changes. Your health care provider may put you on a high dose at first, then on lower dosages for the long term. °? Medicines to suppress your body’s defense (immune) system. These can have serious side effects. °· You may be monitored with X-rays and laboratory work. °· Oxygen therapy may be helpful if oxygen in your blood is low. °· Surgery. In some cases, a lung transplant is an option. ° °Follow these instructions at home: °· Take medicines only as   directed by your health care provider.  Keep your vaccinations up to date as recommended by your health care provider.  Do not use any tobacco products, including cigarettes, chewing tobacco, or electronic cigarettes. If you need help quitting, ask your  health care provider.  Get regular exercise, but do not overexert yourself. Ask your health care provider to suggest some activities that are safe for you to do. Walking and chair exercises can help if you have physical limitations.  Consider joining a pulmonary rehabilitation program or a support group for people with pulmonary fibrosis.  Eat small meals often so you do not get too full. Overeating can make breathing trouble worse.  Maintain a healthy weight. Lose weight if you need to.  Do breathing exercises as directed by your health care provider.  Keep all follow-up visits as directed by your health care provider. This is important. Contact a health care provider if:  You are not able to be as active as usual.  You have a long-lasting (chronic) cough.  You are often short of breath.  You have a fever or chills. Get help right away if:  You have chest pain.  Your breathing is much worse.  You cannot take a deep breath.  You have blue skin around your mouth or fingers.  You have clubbing of your fingers.  You cough up mucus that is dark in color.  You have a lot of headaches.  You get very confused or sleepy. This information is not intended to replace advice given to you by your health care provider. Make sure you discuss any questions you have with your health care provider. Document Released: 12/04/2003 Document Revised: 02/19/2016 Document Reviewed: 02/21/2014 Elsevier Interactive Patient Education  2018 [employer].   Information on my medicine - Coumadin   (Warfarin)  Why was Coumadin prescribed for you? Coumadin was prescribed for you because you have a blood clot or a medical condition that can cause an increased risk of forming blood clots. Blood clots can cause serious health problems by blocking the flow of blood to the heart, lung, or brain. Coumadin can prevent harmful blood clots from forming. As a reminder your indication for Coumadin is:   Stroke  Prevention Because Of Atrial Fibrillation  What test will check on my response to Coumadin? While on Coumadin (warfarin) you will need to have an INR test regularly to ensure that your dose is keeping you in the desired range. The INR (international normalized ratio) number is calculated from the result of the laboratory test called prothrombin time (PT).  If an INR APPOINTMENT HAS NOT ALREADY BEEN MADE FOR YOU please schedule an appointment to have this lab work done by your health care provider within 7 days. Your INR goal is usually a number between:  2 to 3 or your provider may give you a more narrow range like 2-2.5.  Ask your health care provider during an office visit what your goal INR is.  What  do you need to  know  About  COUMADIN? Take Coumadin (warfarin) exactly as prescribed by your healthcare provider about the same time each day.  DO NOT stop taking without talking to the doctor who prescribed the medication.  Stopping without other blood clot prevention medication to take the place of Coumadin may increase your risk of developing a new clot or stroke.  Get refills before you run out.  What do you do if you miss a dose? If you miss a dose,  take it as soon as you remember on the same day then continue your regularly scheduled regimen the next day.  Do not take two doses of Coumadin at the same time.  Important Safety Information A possible side effect of Coumadin (Warfarin) is an increased risk of bleeding. You should call your healthcare provider right away if you experience any of the following: ? Bleeding from an injury or your nose that does not stop. ? Unusual colored urine (red or dark brown) or unusual colored stools (red or black). ? Unusual bruising for unknown reasons. ? A serious fall or if you hit your head (even if there is no bleeding).  Some foods or medicines interact with Coumadin (warfarin) and might alter your response to warfarin. To help avoid this: ? Eat a  balanced diet, maintaining a consistent amount of Vitamin K. ? Notify your provider about major diet changes you plan to make. ? Avoid alcohol or limit your intake to 1 drink for women and 2 drinks for men per day. (1 drink is 5 oz. wine, 12 oz. beer, or 1.5 oz. liquor.)  Make sure that ANY health care provider who prescribes medication for you knows that you are taking Coumadin (warfarin).  Also make sure the healthcare provider who is monitoring your Coumadin knows when you have started a new medication including herbals and non-prescription products.  Coumadin (Warfarin)  Major Drug Interactions  Increased Warfarin Effect Decreased Warfarin Effect  Alcohol (large quantities) Antibiotics (esp. Septra/Bactrim, Flagyl, Cipro) Amiodarone (Cordarone) Aspirin (ASA) Cimetidine (Tagamet) Megestrol (Megace) NSAIDs (ibuprofen, naproxen, etc.) Piroxicam (Feldene) Propafenone (Rythmol SR) Propranolol (Inderal) Isoniazid (INH) Posaconazole (Noxafil) Barbiturates (Phenobarbital) Carbamazepine (Tegretol) Chlordiazepoxide (Librium) Cholestyramine (Questran) Griseofulvin Oral Contraceptives Rifampin Sucralfate (Carafate) Vitamin K   Coumadin (Warfarin) Major Herbal Interactions  Increased Warfarin Effect Decreased Warfarin Effect  Garlic Ginseng Ginkgo biloba Coenzyme Q10 Green tea St. Johns wort    Coumadin (Warfarin) FOOD Interactions  Eat a consistent number of servings per week of foods HIGH in Vitamin K (1 serving =  cup)  Collards (cooked, or boiled & drained) Kale (cooked, or boiled & drained) Mustard greens (cooked, or boiled & drained) Parsley *serving size only =  cup Spinach (cooked, or boiled & drained) Swiss chard (cooked, or boiled & drained) Turnip greens (cooked, or boiled & drained)  Eat a consistent number of servings per week of foods MEDIUM-HIGH in Vitamin K (1 serving = 1 cup)  Asparagus (cooked, or boiled & drained) Broccoli (cooked, boiled & drained,  or raw & chopped) Brussel sprouts (cooked, or boiled & drained) *serving size only =  cup Lettuce, raw (green leaf, endive, romaine) Spinach, raw Turnip greens, raw & chopped   These websites have more information on Coumadin (warfarin):  http://www.king-russell.com/; https://www.hines.net/;

## 2017-10-19 NOTE — Progress Notes (Signed)
ANTICOAGULATION CONSULT NOTE - Initial Consult  Pharmacy Consult for warfarin Indication: atrial fibrillation  Allergies  Allergen Reactions  . Gemfibrozil Nausea And Vomiting and Other (See Comments)    Sick and give urine infection  . Iohexol Itching  . Ivp Dye [Iodinated Diagnostic Agents] Itching    itching  . Bactrim Itching, Nausea And Vomiting and Rash    And diarrhea  . Sulfamethoxazole-Trimethoprim Itching, Nausea And Vomiting and Rash    And diarrhea    Patient Measurements: Height: 5\' 7"  (170.2 cm) Weight: 230 lb (104.3 kg) IBW/kg (Calculated) : 61.6   Vital Signs: Temp: 98.7 F (37.1 C) (01/22 2304) Temp Source: Oral (01/22 2304) BP: 147/94 (01/23 0130) Pulse Rate: 98 (01/23 0130)  Labs: Recent Labs    10/18/17 2305  HGB 15.2*  HCT 45.0  PLT 246  LABPROT 25.4*  INR 2.34  CREATININE 1.35*    Estimated Creatinine Clearance: 32.3 mL/min (A) (by C-G formula based on SCr of 1.35 mg/dL (H)).   Medical History: Past Medical History:  Diagnosis Date  . Arthritis    left leg  . Bradycardia    a. 05/2015: HR 40s-50s, occ 30s at night - not clear how much symptoms were r/t this, patient declined PPM at that time.  . Chronic atrial flutter (HCC)   . Chronic diastolic CHF (congestive heart failure) (HCC)   . CKD (chronic kidney disease), stage III (HCC)   . Diabetes mellitus   . Glaucoma    a. with significantly decreased vision.  . Hypothyroidism   . Mild dementia   . Moderate to severe pulmonary hypertension (HCC) By echo 05/2015  . Permanent atrial fibrillation (HCC)   . Pulmonary embolus (HCC) 03-23-11   a. 1978 and 2012.  Marland Kitchen RBBB   . UTI (lower urinary tract infection)     Assessment: Admitted with cough for several days. Pt is on warfarin PTA for AFib. Current INR is therapeutic 2.3. Last dose was the evening of 1/22.  PTA warfarin: 6 mg po daily   Goal of Therapy:  INR 2-3 Monitor platelets by anticoagulation protocol: Yes    Plan:   -Warfarin 6 mg po x1 -Daily INR   Baldemar Friday 10/19/2017,2:02 AM

## 2017-10-19 NOTE — Progress Notes (Signed)
ANTICOAGULATION CONSULT NOTE - Follow Up Consult  Pharmacy Consult for Coumadin Indication: atrial fibrillation  Allergies  Allergen Reactions  . Gemfibrozil Nausea And Vomiting and Other (See Comments)    Sick and give urine infection  . Iohexol Itching  . Ivp Dye [Iodinated Diagnostic Agents] Itching    itching  . Bactrim Itching, Nausea And Vomiting and Rash    And diarrhea  . Sulfamethoxazole-Trimethoprim Itching, Nausea And Vomiting and Rash    And diarrhea    Patient Measurements: Height: 5\' 7"  (170.2 cm) Weight: 212 lb 11.9 oz (96.5 kg) IBW/kg (Calculated) : 61.6  Vital Signs: Temp: 98.5 F (36.9 C) (01/23 0500) Temp Source: Oral (01/23 0500) BP: 147/67 (01/23 0500) Pulse Rate: 80 (01/23 0500)  Labs: Recent Labs    10/18/17 2305 10/19/17 0443  HGB 15.2*  --   HCT 45.0  --   PLT 246  --   LABPROT 25.4* 26.2*  INR 2.34 2.43  CREATININE 1.35*  --     Estimated Creatinine Clearance: 31.1 mL/min (A) (by C-G formula based on SCr of 1.35 mg/dL (H)).   Assessment: Anticoag: warfarin pta for AFib. CBC wNL.  INR 2.45 today PTA dose: 6 mg po daily, last dose 1/22, admit INR 2.34  Goal of Therapy:  INR 2-3 Monitor platelets by anticoagulation protocol: Yes   Plan:  Continue Coumadin home dose 6mg  po daily Daily INR  Seydina Holliman S. Merilynn Finland, PharmD, BCPS Clinical Staff Pharmacist Pager [phone]  Misty Stanley Stillinger 10/19/2017,8:21 AM

## 2017-10-19 NOTE — H&P (Signed)
History and Physical    Sara Dennis KGU:[id number] DOB: 1922-09-12 DOA: 10/18/2017  PCP: Patient, No Pcp Per  Patient coming from: Home  I have personally briefly reviewed patient's old medical records in [facility]  Chief Complaint: SOB  HPI: Sara Dennis is a 84 y.o. female with medical history significant of a.flutter, CHF, DM2, glaucoma.  Patient presents to the ED with 2 week history of non-productive cough.  No fevers, chills, CP, abd pain, N/V.  No URI symptoms.  Cough progressively worsening, severe.   ED Course: Pulm edema on CXR.  Desats to upper 80s with ambulation.   Review of Systems: As per HPI otherwise 10 point review of systems negative.   Past Medical History:  Diagnosis Date  . Arthritis    left leg  . Bradycardia    a. 05/2015: HR 40s-50s, occ 30s at night - not clear how much symptoms were r/t this, patient declined PPM at that time.  . Chronic atrial flutter (HCC)   . Chronic diastolic CHF (congestive heart failure) (HCC)   . CKD (chronic kidney disease), stage III (HCC)   . Diabetes mellitus   . Glaucoma    a. with significantly decreased vision.  . Hypothyroidism   . Mild dementia   . Moderate to severe pulmonary hypertension (HCC) By echo 05/2015  . Permanent atrial fibrillation (HCC)   . Pulmonary embolus (HCC) 03-23-11   a. 1978 and 2012.  Marland Kitchen RBBB   . UTI (lower urinary tract infection)     Past Surgical History:  Procedure Laterality Date  . CYSTOSCOPY W/ URETERAL STENT PLACEMENT  09/06/2011   Procedure: CYSTOSCOPY WITH RETROGRADE PYELOGRAM/URETERAL STENT PLACEMENT;  Surgeon: Valetta Fuller, MD;  Location: [facility];  Service: Urology;  Laterality: Right;  cysto double j stent placement  . kisney stone removal  1978   removed thru bladder  . URETEROSCOPY  09/06/2011   Procedure: URETEROSCOPY;  Surgeon: Valetta Fuller, MD;  Location: [facility];  Service: Urology;  Laterality: Right;     reports that  has never smoked. she has never used  smokeless tobacco. She reports that she does not drink alcohol or use drugs.  Allergies  Allergen Reactions  . Gemfibrozil Nausea And Vomiting and Other (See Comments)    Sick and give urine infection  . Iohexol Itching  . Ivp Dye [Iodinated Diagnostic Agents] Itching    itching  . Bactrim Itching, Nausea And Vomiting and Rash    And diarrhea  . Sulfamethoxazole-Trimethoprim Itching, Nausea And Vomiting and Rash    And diarrhea    Family History  Problem Relation Age of Onset  . Hypertension Unknown      Prior to Admission medications   Medication Sig Start Date End Date Taking? Authorizing Provider  acetaminophen (TYLENOL) 325 MG tablet Take 2 tablets (650 mg total) by mouth every 6 (six) hours as needed for mild pain (or Fever >/= 101). 09/10/15   Joseph Art, DO  atorvastatin (LIPITOR) 20 MG tablet Take 1 tablet (20 mg total) by mouth daily at 6 PM. 08/11/15   Hilty, Lisette Abu, MD  beta carotene w/minerals (OCUVITE) tablet Take 1 tablet by mouth daily.    [provider]  cyanocobalamin 500 MCG tablet Take 1 tablet (500 mcg total) by mouth daily. 02/08/16   Catarina Hartshorn, MD  donepezil (ARICEPT) 5 MG tablet Take 5 mg by mouth at bedtime.     [provider]  furosemide (LASIX) 40 MG  tablet Take 1 tablet (40 mg total) by mouth daily. 03/17/16   Marinda Elk, MD  insulin glargine (LANTUS) 100 UNIT/ML injection Inject 0.3 mLs (30 Units total) into the skin 2 (two) times daily. Patient taking differently: Inject 35 Units into the skin 2 (two) times daily.  09/10/15   Joseph Art, DO  levothyroxine (SYNTHROID, LEVOTHROID) 50 MCG tablet Take 50 mcg by mouth daily before breakfast.    [provider]  traMADol (ULTRAM) 50 MG tablet Take 50 mg by mouth every 6 (six) hours as needed for moderate pain.     [provider]  warfarin (COUMADIN) 4 MG tablet Take 2 tablets (8 mg total) by mouth daily. On 02/09/16 and 02/10/16 and then 4mg  daily  starting 02/11/16 or as instructed by coumadin clinic Patient taking differently: Take 6 mg by mouth daily.  02/09/16   Catarina Hartshorn, MD    Physical Exam: Vitals:   10/19/17 0000 10/19/17 0030 10/19/17 0100 10/19/17 0130  BP: (!) 140/92 (!) 147/64 (!) 137/51 (!) 147/94  Pulse: 92 97  98  Resp: (!) 28 (!) 36 (!) 33 (!) 27  Temp:      TempSrc:      SpO2: 100% 100%  99%  Weight:      Height:        Constitutional: NAD, calm, comfortable Eyes: PERRL, lids and conjunctivae normal ENMT: Mucous membranes are moist. Posterior pharynx clear of any exudate or lesions.Normal dentition.  Neck: normal, supple, no masses, no thyromegaly Respiratory: Rales in lower lung fields Cardiovascular: Regular rate and rhythm, no murmurs / rubs / gallops. No extremity edema. 2+ pedal pulses. No carotid bruits.  Abdomen: no tenderness, no masses palpated. No hepatosplenomegaly. Bowel sounds positive.  Musculoskeletal: no clubbing / cyanosis. No joint deformity upper and lower extremities. Good ROM, no contractures. Normal muscle tone.  Skin: no rashes, lesions, ulcers. No induration Neurologic: CN 2-12 grossly intact. Sensation intact, DTR normal. Strength 5/5 in all 4.  Psychiatric: Normal judgment and insight. Alert and oriented x 3. Normal mood.    Labs on Admission: I have personally reviewed following labs and imaging studies  CBC: Recent Labs  Lab 10/18/17 2305  WBC 12.5*  NEUTROABS 8.8*  HGB 15.2*  HCT 45.0  MCV 77.3*  PLT 246   Basic Metabolic Panel: Recent Labs  Lab 10/18/17 2305  NA 133*  K 4.4  CL 96*  CO2 24  GLUCOSE 258*  BUN 18  CREATININE 1.35*  CALCIUM 8.6*   GFR: Estimated Creatinine Clearance: 32.3 mL/min (A) (by C-G formula based on SCr of 1.35 mg/dL (H)). Liver Function Tests: Recent Labs  Lab 10/18/17 2305  AST 45*  ALT 26  ALKPHOS 82  BILITOT 1.1  PROT 8.0  ALBUMIN 3.0*   No results for input(s): LIPASE, AMYLASE in the last 168 hours. No results for  input(s): AMMONIA in the last 168 hours. Coagulation Profile: Recent Labs  Lab 10/18/17 2305  INR 2.34   Cardiac Enzymes: No results for input(s): CKTOTAL, CKMB, CKMBINDEX, TROPONINI in the last 168 hours. BNP (last 3 results) No results for input(s): PROBNP in the last 8760 hours. HbA1C: No results for input(s): HGBA1C in the last 72 hours. CBG: No results for input(s): GLUCAP in the last 168 hours. Lipid Profile: No results for input(s): CHOL, HDL, LDLCALC, TRIG, CHOLHDL, LDLDIRECT in the last 72 hours. Thyroid Function Tests: No results for input(s): TSH, T4TOTAL, FREET4, T3FREE, THYROIDAB in the last 72 hours. Anemia Panel:  No results for input(s): VITAMINB12, FOLATE, FERRITIN, TIBC, IRON, RETICCTPCT in the last 72 hours. Urine analysis:    Component Value Date/Time   COLORURINE YELLOW 10/18/2017 2340   APPEARANCEUR HAZY (A) 10/18/2017 2340   LABSPEC 1.014 10/18/2017 2340   PHURINE 5.0 10/18/2017 2340   GLUCOSEU 150 (A) 10/18/2017 2340   HGBUR LARGE (A) 10/18/2017 2340   BILIRUBINUR NEGATIVE 10/18/2017 2340   KETONESUR NEGATIVE 10/18/2017 2340   PROTEINUR 100 (A) 10/18/2017 2340   UROBILINOGEN 0.2 06/13/2015 1105   NITRITE NEGATIVE 10/18/2017 2340   LEUKOCYTESUR SMALL (A) 10/18/2017 2340    Radiological Exams on Admission: Dg Chest 2 View  Result Date: 10/19/2017 CLINICAL DATA:  Cough for 2 weeks. Shortness of breath on exertion. History of chronic atrial flutter, CHF, diabetes, hypertension, pulmonary embolus. EXAM: CHEST  2 VIEW COMPARISON:  03/16/2016 FINDINGS: Cardiac enlargement with probable pulmonary vascular congestion. Interstitial changes in the lungs probably representing a combination of chronic fibrosis with superimposed interstitial edema. Interstitial changes appear more prominent than on the previous study. No pleural effusions. No pneumothorax. Tortuous aorta. Degenerative changes in the spine. IMPRESSION: Increasing cardiac enlargement and interstitial  pattern since previous study likely represents progression of congestive failure and interstitial edema superimposed upon chronic fibrosis. Electronically Signed   By: Burman Nieves M.D.   On: 10/19/2017 00:34    EKG: Independently reviewed.  Assessment/Plan Principal Problem:   Acute on chronic diastolic CHF (congestive heart failure) (HCC) Active Problems:   Diabetes mellitus (HCC)   Atrial flutter (HCC)   Acute respiratory failure with hypoxia (HCC)    1. CHF - 1. CHF pathway 2. Lasix 40mg  IV BID, first dose in ED 3. BMP daily 4. Strict intake and output 5. 2d echo 6. Tele monitor 2. New O2 requirement, desats with ambulation - secondary to #1 above 3. Chronic a.fib / A.Flutter - 1. Continue coumadin 2. Looks like she doesn't get RVR 3. Looks like if anything this has caused bradycardia in past 4. But is being managed conservatively without PPM 5. For more details see Dr. Blanchie Dessert office note 09/15/2015 4. DM2 - 1. Takes Lantus 20 BID at home 2. Will put on Lantus 12 BID here 3. And add Mod scale SSI AC  DVT prophylaxis: Coumadin Code Status: DNR Family Communication: Spoke with daughter on phone who confirmed med dosing Disposition Plan: Home after admit Consults called: None Admission status: Place in [place], [place]wood Iles. DO [facility] Pager [phone]  If 7AM-7PM, please contact day team taking care of patient [email or web address] Password [email or web address]  10/19/2017, 2:02 AM

## 2017-10-19 NOTE — Progress Notes (Signed)
Echocardiogram 2D Echocardiogram has been performed.  Pieter Partridge 10/19/2017, 2:40 PM

## 2017-10-19 NOTE — Progress Notes (Signed)
[facility] PROGRESS NOTE  Sara Dennis HEN:[id number] DOB: March 08, 1922 DOA: 10/18/2017  PCP: Patient, No Pcp Per  Brief History/Interval Summary: 84 year old African-American female with a past medical history of atrial flutter, diabetes mellitus type 2, diastolic CHF, glaucoma, presented with 2-week history of cough shortness of breath.  Chest x-ray showed pulmonary edema.  Patient was admitted to the hospital is on diuretics.  Reason for Visit: Acute diastolic CHF  Consultants: None  Procedures: Echocardiogram is pending  Antibiotics: None  Subjective/Interval History: Patient states that she is feeling some better this morning.  Shortness of breath is improved.  Denies any chest pain.  No nausea vomiting.  ROS: Denies any headaches.  Objective:  Vital Signs  Vitals:   10/19/17 0130 10/19/17 0200 10/19/17 0241 10/19/17 0500  BP: (!) 147/94 (!) 153/72 (!) 147/72 (!) 147/67  Pulse: 98 96 94 80  Resp: (!) 27   (!) 32  Temp:   98.1 F (36.7 C) 98.5 F (36.9 C)  TempSrc:   Oral Oral  SpO2: 99% 94% 92% 90%  Weight:   96.2 kg (212 lb) 96.5 kg (212 lb 11.9 oz)  Height:   5\' 7"  (1.702 m)     Intake/Output Summary (Last 24 hours) at 10/19/2017 1335 Last data filed at 10/19/2017 0930 Gross per 24 hour  Intake 240 ml  Output 750 ml  Net -510 ml   Filed Weights   10/18/17 2307 10/19/17 0241 10/19/17 0500  Weight: 104.3 kg (230 lb) 96.2 kg (212 lb) 96.5 kg (212 lb 11.9 oz)    General appearance: alert, cooperative, appears stated age and no distress Head: Normocephalic, without obvious abnormality, atraumatic Resp: Fine crackles noted bilaterally along with coarse crackles.  No wheezing.  No rhonchi.  Tachypneic.  No use of accessory muscles. Cardio: regular rate and rhythm, S1, S2 normal, no murmur, click, rub or gallop GI: soft, non-tender; bowel sounds normal; no masses,  no organomegaly Extremities: extremities normal, atraumatic, no cyanosis or  edema Neurologic: No obvious neurological deficits noted.  Lab Results:  Data Reviewed: I have personally reviewed following labs and imaging studies  CBC: Recent Labs  Lab 10/18/17 2305  WBC 12.5*  NEUTROABS 8.8*  HGB 15.2*  HCT 45.0  MCV 77.3*  PLT 246    Basic Metabolic Panel: Recent Labs  Lab 10/18/17 2305  NA 133*  K 4.4  CL 96*  CO2 24  GLUCOSE 258*  BUN 18  CREATININE 1.35*  CALCIUM 8.6*    GFR: Estimated Creatinine Clearance: 31.1 mL/min (A) (by C-G formula based on SCr of 1.35 mg/dL (H)).  Liver Function Tests: Recent Labs  Lab 10/18/17 2305  AST 45*  ALT 26  ALKPHOS 82  BILITOT 1.1  PROT 8.0  ALBUMIN 3.0*    Coagulation Profile: Recent Labs  Lab 10/18/17 2305 10/19/17 0443  INR 2.34 2.43    CBG: Recent Labs  Lab 10/19/17 0802 10/19/17 1119  GLUCAP 195* 184*      Radiology Studies: Dg Chest 2 View  Result Date: 10/19/2017 CLINICAL DATA:  Cough for 2 weeks. Shortness of breath on exertion. History of chronic atrial flutter, CHF, diabetes, hypertension, pulmonary embolus. EXAM: CHEST  2 VIEW COMPARISON:  03/16/2016 FINDINGS: Cardiac enlargement with probable pulmonary vascular congestion. Interstitial changes in the lungs probably representing a combination of chronic fibrosis with superimposed interstitial edema. Interstitial changes appear more prominent than on the previous study. No pleural effusions. No pneumothorax. Tortuous aorta. Degenerative changes in the spine. IMPRESSION: Increasing  cardiac enlargement and interstitial pattern since previous study likely represents progression of congestive failure and interstitial edema superimposed upon chronic fibrosis. Electronically Signed   By: Burman Nieves M.D.   On: 10/19/2017 00:34     Medications:  Scheduled: . atorvastatin  20 mg Oral q1800  . donepezil  5 mg Oral QHS  . furosemide  40 mg Intravenous BID  . insulin aspart  0-15 Units Subcutaneous TID WC  . insulin  glargine  12 Units Subcutaneous BID  . levothyroxine  50 mcg Oral QAC breakfast  . sodium chloride flush  3 mL Intravenous Q12H  . warfarin  6 mg Oral q1800  . Warfarin - Pharmacist Dosing Inpatient   Does not apply q1800   Continuous: . sodium chloride     ZOX:WRUEAV chloride, acetaminophen, ondansetron (ZOFRAN) IV, sodium chloride flush  Assessment/Plan:  Principal Problem:   Acute on chronic diastolic CHF (congestive heart failure) (HCC) Active Problems:   Diabetes mellitus (HCC)   Atrial flutter (HCC)   Acute respiratory failure with hypoxia (HCC)    Acute CHF likely diastolic Echocardiogram is pending.  ECHO from 2016 did show grade 2 diastolic dysfunction.  There was also suggestion of pulmonary hypertension and left ventricular hypertrophy.  Continue with IV diuretics.  Strict ins and outs daily weights.  Acute hypoxic respiratory failure Most likely due to CHF as discussed above.  Patient appears to have an underlying interstitial process as well.  She has not required oxygen previously.  Previous echocardiogram did his concern for pulmonary hypertension.  Follow-up on echocardiogram from this hospitalization.  Continue diuretics.  At her advanced age she is likely not a candidate for invasive workup.  Chronic atrial fibrillation/atrial flutter Monitor heart rate closely.  Previously seen by cardiology.  She is on warfarin.  Diabetes mellitus type 2 He uses Lantus at home which is being continued here at a lower dose.  SSI.  Monitor CBGs.  Hypothyroidism Continue levothyroxine.  Dementia Continue donepezil   DVT Prophylaxis: On warfarin    Code Status: DNR Family Communication: Discussed with patient.  No family at bedside. Disposition Plan: Management as outlined above.    LOS: 0 days   Osvaldo Shipper  [facility] Pager [phone] 10/19/2017, 1:35 PM  If 7PM-7AM, please contact night-coverage at [email or web address], password [facility]

## 2017-10-19 NOTE — ED Provider Notes (Signed)
[facility] EMERGENCY DEPARTMENT Provider Note   CSN: [id number] Arrival date & time: 10/18/17  2255     History   Chief Complaint Chief Complaint  Patient presents with  . Shortness of Breath    HPI Sara Dennis is a 84 y.o. female.  HPI  This is a 84 year old female with a history of atrial flutter, CHF, diabetes who presents with cough.  Patient reports 2-week history of nonproductive cough.  She was unable to see her primary physician.  She denies fevers, chills, chest pain, shortness of breath, abdominal pain, nausea, vomiting.  Denies any upper respiratory symptoms including congestion, sore throat, ear pain.  Denies lower extremity swelling.  She states that her cough got so bad she needed to get checked out.  She has not taken anything for her cough.  She does report generalized weakness.  Denies any focal weakness, numbness, tingling.  Patient's daughters at the bedside.  Patient and daughter both confirm patient's wishes to be DNR.  She does not wish to be intubated or have resuscitative efforts if needed.  Past Medical History:  Diagnosis Date  . Arthritis    left leg  . Bradycardia    a. 05/2015: HR 40s-50s, occ 30s at night - not clear how much symptoms were r/t this, patient declined PPM at that time.  . Chronic atrial flutter (HCC)   . Chronic diastolic CHF (congestive heart failure) (HCC)   . CKD (chronic kidney disease), stage III (HCC)   . Diabetes mellitus   . Glaucoma    a. with significantly decreased vision.  . Hypothyroidism   . Mild dementia   . Moderate to severe pulmonary hypertension (HCC) By echo 05/2015  . Permanent atrial fibrillation (HCC)   . Pulmonary embolus (HCC) 03-23-11   a. 1978 and 2012.  Marland Kitchen RBBB   . UTI (lower urinary tract infection)     Patient Active Problem List   Diagnosis Date Noted  . Delirium 03/16/2016  . Delirium due to another medical condition, acute, mixed level of activity 03/16/2016    Class:  Acute  . CKD stage 3 due to type 2 diabetes mellitus (HCC) 02/05/2016  . Atrial fibrillation (HCC) 02/05/2016  . Gout 09/10/2015  . UTI (lower urinary tract infection) 09/06/2015  . Chronic diastolic (congestive) heart failure (HCC) 09/06/2015  . Diabetes mellitus 09/06/2015  . Acute on chronic renal failure (HCC) 09/06/2015  . Warfarin-induced coagulopathy (HCC) 09/06/2015  . Hypothyroidism 09/06/2015  . Fractured medial malleolus   . Demand ischemia (HCC) 06/12/2015  . Pulmonary edema with congestive heart failure (HCC)   . Atrial flutter, unspecified   . Bradycardia 06/11/2015  . Anginal pain (HCC) 06/11/2015  . History of pulmonary embolism 06/11/2015  . Atrial flutter (HCC) 05/06/2013  . Diabetes mellitus (HCC) 05/05/2013  . Hypotension 05/04/2013  . UTI (urinary tract infection) 01/30/2013  . CAP (community acquired pneumonia) 01/30/2013  . Leukocytosis 01/30/2013  . Acute encephalopathy 01/30/2013  . CHF (congestive heart failure) (HCC) 01/30/2013  . Chronic kidney disease stage III 01/30/2013  . Ureteral calculus 09/06/2011    Past Surgical History:  Procedure Laterality Date  . CYSTOSCOPY W/ URETERAL STENT PLACEMENT  09/06/2011   Procedure: CYSTOSCOPY WITH RETROGRADE PYELOGRAM/URETERAL STENT PLACEMENT;  Surgeon: Valetta Fuller, MD;  Location: [facility];  Service: Urology;  Laterality: Right;  cysto double j stent placement  . kisney stone removal  1978   removed thru bladder  . URETEROSCOPY  09/06/2011   Procedure:  URETEROSCOPY;  Surgeon: Valetta Fuller, MD;  Location: [facility];  Service: Urology;  Laterality: Right;    OB History    No data available       Home Medications    Prior to Admission medications   Medication Sig Start Date End Date Taking? Authorizing Provider  acetaminophen (TYLENOL) 325 MG tablet Take 2 tablets (650 mg total) by mouth every 6 (six) hours as needed for mild pain (or Fever >/= 101). 09/10/15   Joseph Art, DO  atorvastatin  (LIPITOR) 20 MG tablet Take 1 tablet (20 mg total) by mouth daily at 6 PM. 08/11/15   Hilty, Lisette Abu, MD  beta carotene w/minerals (OCUVITE) tablet Take 1 tablet by mouth daily.    [provider]  cyanocobalamin 500 MCG tablet Take 1 tablet (500 mcg total) by mouth daily. 02/08/16   Catarina Hartshorn, MD  donepezil (ARICEPT) 5 MG tablet Take 5 mg by mouth at bedtime.     [provider]  enoxaparin (LOVENOX) 100 MG/ML injection Inject 1 mL (100 mg total) into the skin every 12 (twelve) hours. Patient taking differently: Inject 100 mg into the skin daily.  02/08/16   Catarina Hartshorn, MD  furosemide (LASIX) 40 MG tablet Take 1 tablet (40 mg total) by mouth daily. 03/17/16   Marinda Elk, MD  insulin glargine (LANTUS) 100 UNIT/ML injection Inject 0.3 mLs (30 Units total) into the skin 2 (two) times daily. Patient taking differently: Inject 35 Units into the skin 2 (two) times daily.  09/10/15   Joseph Art, DO  levothyroxine (SYNTHROID, LEVOTHROID) 50 MCG tablet Take 50 mcg by mouth daily before breakfast.    [provider]  traMADol (ULTRAM) 50 MG tablet Take 50 mg by mouth every 6 (six) hours as needed for moderate pain.     [provider]  warfarin (COUMADIN) 4 MG tablet Take 2 tablets (8 mg total) by mouth daily. On 02/09/16 and 02/10/16 and then 4mg  daily starting 02/11/16 or as instructed by coumadin clinic Patient taking differently: Take 6 mg by mouth daily.  02/09/16   Catarina Hartshorn, MD    Family History Family History  Problem Relation Age of Onset  . Hypertension Unknown     Social History Social History   Tobacco Use  . Smoking status: Never Smoker  . Smokeless tobacco: Never Used  Substance Use Topics  . Alcohol use: No  . Drug use: No     Allergies   Gemfibrozil; Iohexol; Ivp dye [iodinated diagnostic agents]; Bactrim; and Sulfamethoxazole-trimethoprim   Review of Systems Review of Systems  Constitutional: Negative for fever.  HENT:  Negative for congestion and sore throat.   Respiratory: Positive for cough. Negative for chest tightness and shortness of breath.   Cardiovascular: Negative for chest pain.  Gastrointestinal: Negative for abdominal pain, nausea and vomiting.  Genitourinary: Negative for dysuria.  Neurological: Positive for weakness. Negative for dizziness.  All other systems reviewed and are negative.    Physical Exam Updated Vital Signs BP (!) 147/94   Pulse 98   Temp 98.7 F (37.1 C) (Oral)   Resp (!) 27   Ht 5\' 7"  (1.702 m)   Wt 104.3 kg (230 lb)   SpO2 99%   BMI 36.02 kg/m   Physical Exam  Constitutional: She is oriented to person, place, and time. She appears well-developed and well-nourished.  Elderly, nontoxic-appearing, no acute distress  HENT:  Head: Normocephalic and atraumatic.  Mouth/Throat: Oropharynx is clear and  moist.  Eyes: Pupils are equal, round, and reactive to light.  Cardiovascular: Normal rate, regular rhythm and normal heart sounds.  Pulmonary/Chest: Effort normal. No respiratory distress. She has rales in the left lower field.  Rales left lower lobe  Abdominal: Soft. Bowel sounds are normal. She exhibits no mass.  Musculoskeletal:       Right lower leg: She exhibits no edema.       Left lower leg: She exhibits no edema.  Neurological: She is alert and oriented to person, place, and time.  Skin: Skin is warm and dry.  Psychiatric: She has a normal mood and affect.  Nursing note and vitals reviewed.    ED Treatments / Results  Labs (all labs ordered are listed, but only abnormal results are displayed) Labs Reviewed  CBC WITH DIFFERENTIAL/PLATELET - Abnormal; Notable for the following components:      Result Value   WBC 12.5 (*)    RBC 5.82 (*)    Hemoglobin 15.2 (*)    MCV 77.3 (*)    RDW 16.7 (*)    Neutro Abs 8.8 (*)    Monocytes Absolute 1.8 (*)    All other components within normal limits  COMPREHENSIVE METABOLIC PANEL - Abnormal; Notable for the  following components:   Sodium 133 (*)    Chloride 96 (*)    Glucose, Bld 258 (*)    Creatinine, Ser 1.35 (*)    Calcium 8.6 (*)    Albumin 3.0 (*)    AST 45 (*)    GFR calc non Af Amer 33 (*)    GFR calc Af Amer 38 (*)    All other components within normal limits  URINALYSIS, ROUTINE W REFLEX MICROSCOPIC - Abnormal; Notable for the following components:   APPearance HAZY (*)    Glucose, UA 150 (*)    Hgb urine dipstick LARGE (*)    Protein, ur 100 (*)    Leukocytes, UA SMALL (*)    Bacteria, UA FEW (*)    Squamous Epithelial / LPF 0-5 (*)    All other components within normal limits  PROTIME-INR - Abnormal; Notable for the following components:   Prothrombin Time 25.4 (*)    All other components within normal limits  URINE CULTURE    EKG  EKG Interpretation  Date/Time:  Tuesday October 18 2017 22:57:07 EST Ventricular Rate:  85 PR Interval:    QRS Duration: 140 QT Interval:  433 QTC Calculation: 515 R Axis:   -60 Text Interpretation:  Normal sinus rhythm Right bundle branch block LVH with IVCD and secondary repol abnrm Inferior infarct, old Prolonged QT interval Confirmed by Ross Marcus ([id number]) on 10/18/2017 11:03:49 PM       Radiology Dg Chest 2 View  Result Date: 10/19/2017 CLINICAL DATA:  Cough for 2 weeks. Shortness of breath on exertion. History of chronic atrial flutter, CHF, diabetes, hypertension, pulmonary embolus. EXAM: CHEST  2 VIEW COMPARISON:  03/16/2016 FINDINGS: Cardiac enlargement with probable pulmonary vascular congestion. Interstitial changes in the lungs probably representing a combination of chronic fibrosis with superimposed interstitial edema. Interstitial changes appear more prominent than on the previous study. No pleural effusions. No pneumothorax. Tortuous aorta. Degenerative changes in the spine. IMPRESSION: Increasing cardiac enlargement and interstitial pattern since previous study likely represents progression of congestive failure and  interstitial edema superimposed upon chronic fibrosis. Electronically Signed   By: Burman Nieves M.D.   On: 10/19/2017 00:34    Procedures Procedures (including critical care time)  Medications  Ordered in ED Medications  furosemide (LASIX) injection 40 mg (40 mg Intravenous Given 10/19/17 0123)     Initial Impression / Assessment and Plan / ED Course  I have reviewed the triage vital signs and the nursing notes.  Pertinent labs & imaging results that were available during my care of the patient were reviewed by me and considered in my medical decision making (see chart for details).     Patient presents with cough.  Denies infectious symptoms.  Initially nontoxic appearing and vital signs reassuring.  However, patient had progressive tachypnea into the 30s.  She does not look overtly volume overloaded but her chest x-ray suggestive of worsening cardiomegaly and interstitial prominence concerning for worsening CHF.  BNP added to workup.  She is without chest pain.  EKG shows no signs of ischemia.  On recheck, patient states that she feels fine but does appear a little bit more tachypneic.  She dropped her oxygen saturations to 86% with attempted ambulation.  Will admit for diuresis and repeat echocardiogram.  Patient and daughter were updated with the plan.  DNR status confirmed.  After history, exam, and medical workup I feel the patient has been appropriately medically screened and is safe for discharge home. Pertinent diagnoses were discussed with the patient. Patient was given return precautions.   Final Clinical Impressions(s) / ED Diagnoses   Final diagnoses:  Acute diastolic congestive heart failure (HCC)  AKI (acute kidney injury) (HCC)  Cough    ED Discharge Orders    None       Wilkie Aye, Mayer Masker, MD 10/19/17 0151

## 2017-10-20 ENCOUNTER — Encounter (HOSPITAL_COMMUNITY): Payer: Self-pay | Admitting: General Practice

## 2017-10-20 ENCOUNTER — Inpatient Hospital Stay (HOSPITAL_COMMUNITY): Payer: Medicare HMO

## 2017-10-20 DIAGNOSIS — Z66 Do not resuscitate: Secondary | ICD-10-CM | POA: Diagnosis present

## 2017-10-20 DIAGNOSIS — I2721 Secondary pulmonary arterial hypertension: Secondary | ICD-10-CM | POA: Diagnosis not present

## 2017-10-20 DIAGNOSIS — J9601 Acute respiratory failure with hypoxia: Secondary | ICD-10-CM | POA: Diagnosis present

## 2017-10-20 DIAGNOSIS — E039 Hypothyroidism, unspecified: Secondary | ICD-10-CM | POA: Diagnosis present

## 2017-10-20 DIAGNOSIS — Z8249 Family history of ischemic heart disease and other diseases of the circulatory system: Secondary | ICD-10-CM | POA: Diagnosis not present

## 2017-10-20 DIAGNOSIS — N179 Acute kidney failure, unspecified: Secondary | ICD-10-CM | POA: Diagnosis present

## 2017-10-20 DIAGNOSIS — N183 Chronic kidney disease, stage 3 (moderate): Secondary | ICD-10-CM | POA: Diagnosis present

## 2017-10-20 DIAGNOSIS — I509 Heart failure, unspecified: Secondary | ICD-10-CM

## 2017-10-20 DIAGNOSIS — I4510 Unspecified right bundle-branch block: Secondary | ICD-10-CM | POA: Diagnosis present

## 2017-10-20 DIAGNOSIS — I4891 Unspecified atrial fibrillation: Secondary | ICD-10-CM | POA: Diagnosis not present

## 2017-10-20 DIAGNOSIS — Z86711 Personal history of pulmonary embolism: Secondary | ICD-10-CM | POA: Diagnosis not present

## 2017-10-20 DIAGNOSIS — E1122 Type 2 diabetes mellitus with diabetic chronic kidney disease: Secondary | ICD-10-CM | POA: Diagnosis present

## 2017-10-20 DIAGNOSIS — H409 Unspecified glaucoma: Secondary | ICD-10-CM | POA: Diagnosis present

## 2017-10-20 DIAGNOSIS — E118 Type 2 diabetes mellitus with unspecified complications: Secondary | ICD-10-CM

## 2017-10-20 DIAGNOSIS — I4892 Unspecified atrial flutter: Secondary | ICD-10-CM | POA: Diagnosis present

## 2017-10-20 DIAGNOSIS — Z6839 Body mass index (BMI) 39.0-39.9, adult: Secondary | ICD-10-CM | POA: Diagnosis not present

## 2017-10-20 DIAGNOSIS — I482 Chronic atrial fibrillation: Secondary | ICD-10-CM | POA: Diagnosis present

## 2017-10-20 DIAGNOSIS — Z794 Long term (current) use of insulin: Secondary | ICD-10-CM

## 2017-10-20 DIAGNOSIS — J849 Interstitial pulmonary disease, unspecified: Secondary | ICD-10-CM | POA: Diagnosis not present

## 2017-10-20 DIAGNOSIS — I130 Hypertensive heart and chronic kidney disease with heart failure and stage 1 through stage 4 chronic kidney disease, or unspecified chronic kidney disease: Secondary | ICD-10-CM | POA: Diagnosis present

## 2017-10-20 DIAGNOSIS — I5033 Acute on chronic diastolic (congestive) heart failure: Secondary | ICD-10-CM | POA: Diagnosis present

## 2017-10-20 DIAGNOSIS — B961 Klebsiella pneumoniae [K. pneumoniae] as the cause of diseases classified elsewhere: Secondary | ICD-10-CM | POA: Diagnosis present

## 2017-10-20 DIAGNOSIS — Z79890 Hormone replacement therapy (postmenopausal): Secondary | ICD-10-CM | POA: Diagnosis not present

## 2017-10-20 DIAGNOSIS — Z79899 Other long term (current) drug therapy: Secondary | ICD-10-CM | POA: Diagnosis not present

## 2017-10-20 DIAGNOSIS — R05 Cough: Secondary | ICD-10-CM | POA: Diagnosis present

## 2017-10-20 DIAGNOSIS — E669 Obesity, unspecified: Secondary | ICD-10-CM | POA: Diagnosis present

## 2017-10-20 DIAGNOSIS — N390 Urinary tract infection, site not specified: Secondary | ICD-10-CM | POA: Diagnosis present

## 2017-10-20 DIAGNOSIS — T380X5A Adverse effect of glucocorticoids and synthetic analogues, initial encounter: Secondary | ICD-10-CM | POA: Diagnosis not present

## 2017-10-20 DIAGNOSIS — F0390 Unspecified dementia without behavioral disturbance: Secondary | ICD-10-CM | POA: Diagnosis present

## 2017-10-20 DIAGNOSIS — M1990 Unspecified osteoarthritis, unspecified site: Secondary | ICD-10-CM | POA: Diagnosis present

## 2017-10-20 DIAGNOSIS — Z7901 Long term (current) use of anticoagulants: Secondary | ICD-10-CM | POA: Diagnosis not present

## 2017-10-20 LAB — CBC
HCT: 44 % (ref 36.0–46.0)
HEMOGLOBIN: 14.8 g/dL (ref 12.0–15.0)
MCH: 26 pg (ref 26.0–34.0)
MCHC: 33.6 g/dL (ref 30.0–36.0)
MCV: 77.2 fL — ABNORMAL LOW (ref 78.0–100.0)
Platelets: 279 10*3/uL (ref 150–400)
RBC: 5.7 MIL/uL — AB (ref 3.87–5.11)
RDW: 16.7 % — ABNORMAL HIGH (ref 11.5–15.5)
WBC: 13.9 10*3/uL — ABNORMAL HIGH (ref 4.0–10.5)

## 2017-10-20 LAB — BASIC METABOLIC PANEL
ANION GAP: 12 (ref 5–15)
BUN: 22 mg/dL — ABNORMAL HIGH (ref 6–20)
CHLORIDE: 98 mmol/L — AB (ref 101–111)
CO2: 26 mmol/L (ref 22–32)
Calcium: 8.4 mg/dL — ABNORMAL LOW (ref 8.9–10.3)
Creatinine, Ser: 1.2 mg/dL — ABNORMAL HIGH (ref 0.44–1.00)
GFR calc Af Amer: 44 mL/min — ABNORMAL LOW (ref >60)
GFR, EST NON AFRICAN AMERICAN: 38 mL/min — AB (ref >60)
GLUCOSE: 174 mg/dL — AB (ref 65–99)
POTASSIUM: 4.1 mmol/L (ref 3.5–5.1)
Sodium: 136 mmol/L (ref 135–145)

## 2017-10-20 LAB — GLUCOSE, CAPILLARY
GLUCOSE-CAPILLARY: 186 mg/dL — AB (ref 65–99)
GLUCOSE-CAPILLARY: 259 mg/dL — AB (ref 65–99)
Glucose-Capillary: 344 mg/dL — ABNORMAL HIGH (ref 65–99)
Glucose-Capillary: 349 mg/dL — ABNORMAL HIGH (ref 65–99)

## 2017-10-20 LAB — PROTIME-INR
INR: 2.99
PROTHROMBIN TIME: 30.8 s — AB (ref 11.4–15.2)

## 2017-10-20 MED ORDER — FUROSEMIDE 10 MG/ML IJ SOLN
40.0000 mg | Freq: Three times a day (TID) | INTRAMUSCULAR | Status: DC
  Administered 2017-10-20 – 2017-10-22 (×6): 40 mg via INTRAVENOUS
  Filled 2017-10-20 (×6): qty 4

## 2017-10-20 MED ORDER — WARFARIN SODIUM 2.5 MG PO TABS
2.5000 mg | ORAL_TABLET | Freq: Once | ORAL | Status: AC
  Administered 2017-10-20: 2.5 mg via ORAL
  Filled 2017-10-20: qty 1

## 2017-10-20 MED ORDER — INSULIN ASPART 100 UNIT/ML ~~LOC~~ SOLN
5.0000 [IU] | Freq: Once | SUBCUTANEOUS | Status: AC
  Administered 2017-10-20: 5 [IU] via SUBCUTANEOUS

## 2017-10-20 MED ORDER — DEXTROSE 5 % IV SOLN
1.0000 g | INTRAVENOUS | Status: DC
  Administered 2017-10-21 – 2017-10-22 (×2): 1 g via INTRAVENOUS
  Filled 2017-10-20 (×2): qty 10

## 2017-10-20 MED ORDER — METHYLPREDNISOLONE SODIUM SUCC 40 MG IJ SOLR
40.0000 mg | Freq: Two times a day (BID) | INTRAMUSCULAR | Status: AC
  Administered 2017-10-20 – 2017-10-22 (×6): 40 mg via INTRAVENOUS
  Filled 2017-10-20 (×6): qty 1

## 2017-10-20 NOTE — Significant Event (Addendum)
Rapid Response Event Note  Overview: Start Time 2220   Initial Focused Assessment: While rounding asked by RN to assess pateint as "second set of eyes". Per RN, patient was lethargic and diaphoretic, VSS with the exception of having a missed beat on telemetry. When I walked in, patient easily awoken to voice, able to follow commands in all extremities, oriented to all questions.  + productive cough (yellow sputum) and UC was positive but not actively being treated for UTI.  No fever appreciated, high blood sugars (on IV solumedrol for PHTN/ILD). Lung sounds clear to diminished, skin is warm and dry. When I arrived, staff were cleaning the patient up and urine did smell foul. Not in any distress and denies all pain except for burning from around her urethra (dysuria). HR 60-70s, SR with missed beats, 99% on RA, RR equal and even 16-18. SBP in the 130s  Interventions: -- EKG -- CXR  Plan of Care (if not transferred): -- RN to page primary service, patient might need to be treated for UTI, this could be a factor with mental status fluctuating.   Event Summary:   at   2245  Sara Dennis R

## 2017-10-20 NOTE — Progress Notes (Signed)
ANTICOAGULATION CONSULT NOTE - Follow Up Consult  Pharmacy Consult for Coumadin Indication: afib/flutter  Allergies  Allergen Reactions  . Brilliant Blue Fcf Hives  . Ciprofloxacin Itching  . Gemfibrozil Nausea And Vomiting and Other (See Comments)    Sick and give urine infection  . Iohexol Itching  . Ivp Dye [Iodinated Diagnostic Agents] Itching    itching  . Bactrim Itching, Nausea And Vomiting and Rash    And diarrhea  . Sulfamethoxazole-Trimethoprim Itching, Nausea And Vomiting and Rash    And diarrhea    Patient Measurements: Height: 5\' 7"  (170.2 cm) Weight: 209 lb 10.5 oz (95.1 kg) IBW/kg (Calculated) : 61.6  Vital Signs: Temp: 98.2 F (36.8 C) (01/24 0655) Temp Source: Oral (01/24 0655) BP: 135/89 (01/24 0655) Pulse Rate: 83 (01/24 0655)  Labs: Recent Labs    10/18/17 2305 10/19/17 0443 10/20/17 0432  HGB 15.2*  --  14.8  HCT 45.0  --  44.0  PLT 246  --  279  LABPROT 25.4* 26.2* 30.8*  INR 2.34 2.43 2.99  CREATININE 1.35*  --  1.20*    Estimated Creatinine Clearance: 34.7 mL/min (A) (by C-G formula based on SCr of 1.2 mg/dL (H)).   Assessment:  Anticoag: warfarin pta for AFib/flutter. CBC wNL.  INR 2.45 >2.99 today PTA dose: 6 mg po daily, last dose 1/22, admit INR 2.34  Goal of Therapy:  INR 2-3 Monitor platelets by anticoagulation protocol: Yes   Plan:  -d/c Coumadin 6mg  daily and only give Warfarin 2.5 mg po x 1 tonight -Daily INR    Sara Dennis S. Merilynn Finland, PharmD, [facility] Clinical Staff Pharmacist Pager [phone]  Misty Stanley Stillinger 10/20/2017,10:07 AM

## 2017-10-20 NOTE — Progress Notes (Signed)
[name], NP notified of rapid response RN's assessment of pt.  Order received for IV antibiotics.  Will continue to monitor.  Sara Dennis

## 2017-10-20 NOTE — Care Management Obs Status (Signed)
MEDICARE OBSERVATION STATUS NOTIFICATION   Patient Details  Name: Sara Dennis MRN: [id number] Date of Birth: Oct 12, 1921   Medicare Observation Status Notification Given:  Other (see comment) LM with daughter Annice Pih at request of patient to review [facility]. Awaiting callback [phone]   Lawerance Sabal, RN 10/20/2017, 11:15 AM

## 2017-10-20 NOTE — Care Management Obs Status (Signed)
MEDICARE OBSERVATION STATUS NOTIFICATION   Patient Details  Name: NATIYAH HAAS MRN: [id number] Date of Birth: March 18, 1922   Medicare Observation Status Notification Given:  Yes    Lawerance Sabal, RN 10/20/2017, 11:34 AM

## 2017-10-20 NOTE — Progress Notes (Addendum)
[facility] PROGRESS NOTE  Sara Dennis ZOX:[id number] DOB: 1922/07/24 DOA: 10/18/2017  PCP: Dr. Kennith Center  Brief History/Interval Summary: 84 year old African-American female with a past medical history of atrial flutter, diabetes mellitus type 2, diastolic CHF, glaucoma, presented with 2-week history of cough shortness of breath.  Chest x-ray showed pulmonary edema.  Patient was admitted to the hospital is on diuretics.  Reason for Visit: Acute diastolic CHF  Consultants: None  Procedures:  Echocardiogram Study Conclusions - Left ventricle: The cavity size was normal. There was moderate   concentric hypertrophy. Systolic function was vigorous. The   estimated ejection fraction was in the range of 65% to 70%. Wall   motion was normal; there were no regional wall motion   abnormalities. - Ventricular septum: The contour showed diastolic flattening. - Aortic valve: Trileaflet; mildly thickened, mildly calcified   leaflets. There was mild regurgitation. - Right ventricle: The cavity size was moderately dilated. Wall   thickness was normal. Systolic function was moderately to   severely reduced. - Pulmonary arteries: Systolic pressure was severely increased. PA   peak pressure: 78 mm Hg (S). Impressions: - Compared to the prior study, there has been no significant   interval change.  Antibiotics: None  Subjective/Interval History: Patient states that she is feeling better.  Shortness of breath has improved.  Denies any chest pain.  No nausea vomiting.    ROS: Denies any headaches  Objective:  Vital Signs  Vitals:   10/19/17 2036 10/19/17 2349 10/20/17 0600 10/20/17 0655  BP: 105/73   135/89  Pulse: 98  78 83  Resp:   (!) 32 17  Temp: 98.3 F (36.8 C)   98.2 F (36.8 C)  TempSrc: Oral   Oral  SpO2: 94% 93% 95% 100%  Weight:    95.1 kg (209 lb 10.5 oz)  Height:        Intake/Output Summary (Last 24 hours) at 10/20/2017 0926 Last data filed at 10/20/2017  0655 Gross per 24 hour  Intake 240 ml  Output 600 ml  Net -360 ml   Filed Weights   10/19/17 0241 10/19/17 0500 10/20/17 0655  Weight: 96.2 kg (212 lb) 96.5 kg (212 lb 11.9 oz) 95.1 kg (209 lb 10.5 oz)    General appearance: Awake alert.  In no distress. Resp: Perhaps slightly improved air entry bilaterally.  Continues to have crackles.  Occasional wheezing heard today.  No rhonchi.  Normal effort at rest.   Cardio: S1-S2 is normal regular.  No rubs murmurs or bruit GI: Abdomen soft.  Nontender nondistended. Extremities: No Edema Neurologic: No obvious neurological deficits noted.  Lab Results:  Data Reviewed: I have personally reviewed following labs and imaging studies  CBC: Recent Labs  Lab 10/18/17 2305 10/20/17 0432  WBC 12.5* 13.9*  NEUTROABS 8.8*  --   HGB 15.2* 14.8  HCT 45.0 44.0  MCV 77.3* 77.2*  PLT 246 279    Basic Metabolic Panel: Recent Labs  Lab 10/18/17 2305 10/20/17 0432  NA 133* 136  K 4.4 4.1  CL 96* 98*  CO2 24 26  GLUCOSE 258* 174*  BUN 18 22*  CREATININE 1.35* 1.20*  CALCIUM 8.6* 8.4*    GFR: Estimated Creatinine Clearance: 34.7 mL/min (A) (by C-G formula based on SCr of 1.2 mg/dL (H)).  Liver Function Tests: Recent Labs  Lab 10/18/17 2305  AST 45*  ALT 26  ALKPHOS 82  BILITOT 1.1  PROT 8.0  ALBUMIN 3.0*    Coagulation Profile: Recent  Labs  Lab 10/18/17 2305 10/19/17 0443 10/20/17 0432  INR 2.34 2.43 2.99    CBG: Recent Labs  Lab 10/19/17 0802 10/19/17 1119 10/19/17 1714 10/19/17 2129 10/20/17 0739  GLUCAP 195* 184* 150* 183* 186*      Radiology Studies: Dg Chest 2 View  Result Date: 10/19/2017 CLINICAL DATA:  Cough for 2 weeks. Shortness of breath on exertion. History of chronic atrial flutter, CHF, diabetes, hypertension, pulmonary embolus. EXAM: CHEST  2 VIEW COMPARISON:  03/16/2016 FINDINGS: Cardiac enlargement with probable pulmonary vascular congestion. Interstitial changes in the lungs probably  representing a combination of chronic fibrosis with superimposed interstitial edema. Interstitial changes appear more prominent than on the previous study. No pleural effusions. No pneumothorax. Tortuous aorta. Degenerative changes in the spine. IMPRESSION: Increasing cardiac enlargement and interstitial pattern since previous study likely represents progression of congestive failure and interstitial edema superimposed upon chronic fibrosis. Electronically Signed   By: Burman Nieves M.D.   On: 10/19/2017 00:34     Medications:  Scheduled: . atorvastatin  20 mg Oral q1800  . donepezil  5 mg Oral QHS  . furosemide  40 mg Intravenous BID  . guaiFENesin  600 mg Oral BID  . insulin aspart  0-15 Units Subcutaneous TID WC  . insulin glargine  12 Units Subcutaneous BID  . levothyroxine  50 mcg Oral QAC breakfast  . sodium chloride flush  3 mL Intravenous Q12H  . warfarin  6 mg Oral q1800  . Warfarin - Pharmacist Dosing Inpatient   Does not apply q1800   Continuous: . sodium chloride     ZOX:WRUEAV chloride, acetaminophen, guaiFENesin-dextromethorphan, ondansetron (ZOFRAN) IV, sodium chloride flush  Assessment/Plan:  Principal Problem:   Acute on chronic diastolic CHF (congestive heart failure) (HCC) Active Problems:   Diabetes mellitus (HCC)   Atrial flutter (HCC)   Acute respiratory failure with hypoxia (HCC)    Acute CHF likely diastolic Echocardiogram is as above.  No significant change from echocardiogram of 2016.  So this is most likely diastolic CHF.  There could be a component of right heart failure as well.  However patient does not have significant lower extremity edema.  Venous thromboembolism is less likely considering that patient is already anticoagulated with therapeutic INR.  Patient is improving with IV diuretics.  She appears to be losing weight.  Ins and outs not being charted accurately.  We will give her an increased dose of diuretics today.  ECHO from 2016 did show  grade 2 diastolic dysfunction.  There was also suggestion of pulmonary hypertension and left ventricular hypertrophy.  Strict ins and outs. Daily weights.  Acute hypoxic respiratory failure Most likely due to CHF as discussed above.  Patient appears to have an underlying interstitial process as well.  She has not required oxygen previously.  Echocardiogram does raise concern for pulmonary hypertension.  He could have underlying interstitial lung disease.  Patient denies any history of smoking.  The interstitial process could also be contributing to her dyspnea.  Give her a trial of steroids.  Continue diuretics.  At her advanced age she is likely not a candidate for invasive workup.  Chronic atrial fibrillation/atrial flutter Monitor heart rate closely.  Previously seen by cardiology.  She is on warfarin.  Diabetes mellitus type 2 She uses Lantus at home which is being continued here at a lower dose.  SSI.  Monitor CBGs.  Anticipate some worsening in glycemic control due to steroids.  Hypothyroidism Continue levothyroxine.  Dementia Continue donepezil  Positive  urine culture Does not appear that the patient is having any symptoms suggestive of cystitis.  Follow-up on final culture report.  No antibiotics for now.   DVT Prophylaxis: On warfarin    Code Status: DNR Family Communication: Discussed with patient.  No family at bedside. Disposition Plan: Management as outlined above.  PT evaluation.    LOS: 0 days   Osvaldo Shipper  [facility] Pager [phone] 10/20/2017, 9:26 AM  If 7PM-7AM, please contact night-coverage at [email or web address], password [facility]

## 2017-10-21 ENCOUNTER — Encounter (HOSPITAL_COMMUNITY): Payer: Self-pay | Admitting: Physician Assistant

## 2017-10-21 DIAGNOSIS — I5033 Acute on chronic diastolic (congestive) heart failure: Secondary | ICD-10-CM

## 2017-10-21 DIAGNOSIS — N390 Urinary tract infection, site not specified: Secondary | ICD-10-CM

## 2017-10-21 LAB — GLUCOSE, CAPILLARY
GLUCOSE-CAPILLARY: 306 mg/dL — AB (ref 65–99)
GLUCOSE-CAPILLARY: 372 mg/dL — AB (ref 65–99)
GLUCOSE-CAPILLARY: 254 mg/dL — AB (ref 65–99)
Glucose-Capillary: 312 mg/dL — ABNORMAL HIGH (ref 65–99)

## 2017-10-21 LAB — BASIC METABOLIC PANEL
ANION GAP: 13 (ref 5–15)
BUN: 32 mg/dL — ABNORMAL HIGH (ref 6–20)
CALCIUM: 8.6 mg/dL — AB (ref 8.9–10.3)
CO2: 26 mmol/L (ref 22–32)
Chloride: 95 mmol/L — ABNORMAL LOW (ref 101–111)
Creatinine, Ser: 1.27 mg/dL — ABNORMAL HIGH (ref 0.44–1.00)
GFR calc Af Amer: 41 mL/min — ABNORMAL LOW (ref >60)
GFR, EST NON AFRICAN AMERICAN: 35 mL/min — AB (ref >60)
GLUCOSE: 322 mg/dL — AB (ref 65–99)
POTASSIUM: 5 mmol/L (ref 3.5–5.1)
Sodium: 134 mmol/L — ABNORMAL LOW (ref 135–145)

## 2017-10-21 LAB — URINE CULTURE

## 2017-10-21 LAB — PROTIME-INR
INR: 3.57
Prothrombin Time: 35.4 seconds — ABNORMAL HIGH (ref 11.4–15.2)

## 2017-10-21 MED ORDER — INSULIN GLARGINE 100 UNIT/ML ~~LOC~~ SOLN
18.0000 [IU] | Freq: Two times a day (BID) | SUBCUTANEOUS | Status: DC
  Administered 2017-10-21 – 2017-10-24 (×6): 18 [IU] via SUBCUTANEOUS
  Filled 2017-10-21 (×7): qty 0.18

## 2017-10-21 MED ORDER — INSULIN GLARGINE 100 UNIT/ML ~~LOC~~ SOLN
6.0000 [IU] | Freq: Once | SUBCUTANEOUS | Status: AC
  Administered 2017-10-21: 6 [IU] via SUBCUTANEOUS
  Filled 2017-10-21: qty 0.06

## 2017-10-21 NOTE — Progress Notes (Signed)
ANTICOAGULATION CONSULT NOTE - Follow Up Consult  Pharmacy Consult for Coumadin Indication: atrial fibrillation  Allergies  Allergen Reactions  . Brilliant Blue Fcf Hives  . Ciprofloxacin Itching  . Gemfibrozil Nausea And Vomiting and Other (See Comments)    Sick and give urine infection  . Iohexol Itching  . Ivp Dye [Iodinated Diagnostic Agents] Itching    itching  . Bactrim Itching, Nausea And Vomiting and Rash    And diarrhea  . Sulfamethoxazole-Trimethoprim Itching, Nausea And Vomiting and Rash    And diarrhea    Patient Measurements: Height: 5\' 7"  (170.2 cm) Weight: 210 lb 8.6 oz (95.5 kg) IBW/kg (Calculated) : 61.6  Vital Signs: Temp: 98 F (36.7 C) (01/25 0500) Temp Source: Oral (01/25 0500) BP: 148/55 (01/25 0500) Pulse Rate: 57 (01/25 0500)  Labs: Recent Labs    10/18/17 2305 10/19/17 0443 10/20/17 0432 10/21/17 0446  HGB 15.2*  --  14.8  --   HCT 45.0  --  44.0  --   PLT 246  --  279  --   LABPROT 25.4* 26.2* 30.8* 35.4*  INR 2.34 2.43 2.99 3.57  CREATININE 1.35*  --  1.20* 1.27*    Estimated Creatinine Clearance: 32.9 mL/min (A) (by C-G formula based on SCr of 1.27 mg/dL (H)).  Assessment:   84 yr old female continues on Coumadin for atrial fibrillation.   INR up to 3.57, supratherapeutic.   Coumadin dose decreased to 2.5 mg x 1 on 1/24 but INR continued to rise.    PTA Coumadin: 6 mg daily, INR 2.34 on admit 1/22     Day # 1 Ceftriaxone 1gm IV q24hrs for Klebsiella UTI.  Goal of Therapy:  INR 2-3 Monitor platelets by anticoagulation protocol: Yes   Plan:    No Coumadin today.   Daily PT/INR.  Dennie Fetters, RPh Pager: [phone] 10/21/2017,11:57 AM

## 2017-10-21 NOTE — Progress Notes (Signed)
CBG 349.  Pt lethargic, slow to arouse, and diaphoretic.  VSS.  [name], NP notified.  Novolog 5 units sub-Q given in addition to scheduled Lantus per NP order.  Will continue to monitor.  Alonza Bogus

## 2017-10-21 NOTE — Evaluation (Signed)
Physical Therapy Evaluation Patient Details Name: Sara Dennis MRN: [id number] DOB: 1921-10-05 Today's Date: 10/21/2017   History of Present Illness  Sara Dennis is a 84 y.o. female with medical history significant of a.flutter, CHF, DM2, glaucoma.  Patient presents to the ED with 2 week history of non-productive cough.  Found to have acute on Chronic diastolic CHF.   Clinical Impression  Pt admitted with/for worsening cough and found to be in acute on chronic dCHF.  Pt needing min assist for most mobility on evaluation.  Pt currently limited functionally due to the problems listed below.  (see problems list.)  Pt will benefit from PT to maximize function and safety to be able to get home safely with available assist.     Follow Up Recommendations Home health PT;Supervision/Assistance - 24 hour    Equipment Recommendations  None recommended by PT    Recommendations for Other Services       Precautions / Restrictions Precautions Precautions: Fall      Mobility  Bed Mobility Overal bed mobility: Needs Assistance Bed Mobility: Supine to Sit     Supine to sit: Min assist     General bed mobility comments: min truncal assist up to R elbow.  Transfers Overall transfer level: Needs assistance   Transfers: Sit to/from Stand Sit to Stand: Min assist         General transfer comment: pt using good transfer safety without cues  Ambulation/Gait Ambulation/Gait assistance: Min assist Ambulation Distance (Feet): 15 Feet Assistive device: Rolling walker (2 wheeled) Gait Pattern/deviations: Step-through pattern   Gait velocity interpretation: Below normal speed for age/gender General Gait Details: short, guarded steps, mildly weak knees.  RW was maneuvered for pt due to pt blind and in unfamiliar surroundings.  Stairs            Wheelchair Mobility    Modified Rankin (Stroke Patients Only)       Balance Overall balance assessment: Needs  assistance Sitting-balance support: No upper extremity supported Sitting balance-Leahy Scale: Fair Sitting balance - Comments: prefers UE assist due to blindness, but can sit safely without UE asist   Standing balance support: Bilateral upper extremity supported Standing balance-Leahy Scale: Poor Standing balance comment: reliant on AD or external support.                             Pertinent Vitals/Pain Pain Assessment: No/denies pain    Home Living Family/patient expects to be discharged to:: Private residence Living Arrangements: Children;Other (Comment)(dtr and grandson.) Available Help at Discharge: Family;Available 24 hours/day Type of Home: Mobile home Home Access: Stairs to enter Entrance Stairs-Rails: Right;Left Entrance Stairs-Number of Steps: 2 Home Layout: One level Home Equipment: Walker - 2 wheels;Bedside commode      Prior Function Level of Independence: Needs assistance   Gait / Transfers Assistance Needed: Uses RW  ADL's / Homemaking Assistance Needed: has PCA 7 days/week for adL assist  Comments: pt reports using RW throughout the home and to BR without having assist     Hand Dominance        Extremity/Trunk Assessment   Upper Extremity Assessment Upper Extremity Assessment: Overall WFL for tasks assessed;Generalized weakness    Lower Extremity Assessment Lower Extremity Assessment: Overall WFL for tasks assessed(proximal weakness/core/lower abdominals)       Communication   Communication: No difficulties  Cognition Arousal/Alertness: Awake/alert Behavior During Therapy: WFL for tasks assessed/performed Overall Cognitive Status: History of cognitive impairments -  at baseline                                        General Comments General comments (skin integrity, edema, etc.): On 4 L of O2, pt's sats in the upper 90's,  on RA sats dropped to 90% in a 5-8 min period including ambulating to the chair     Exercises     Assessment/Plan    PT Assessment Patient needs continued PT services  PT Problem List Decreased strength;Decreased activity tolerance;Decreased balance;Decreased mobility;Decreased knowledge of use of DME;Cardiopulmonary status limiting activity       PT Treatment Interventions Gait training;Functional mobility training;Therapeutic activities;Therapeutic exercise;Patient/family education;DME instruction;Stair training    PT Goals (Current goals can be found in the Care Plan section)  Acute Rehab PT Goals Patient Stated Goal: home soon PT Goal Formulation: With patient Time For Goal Achievement: 10/28/17 Potential to Achieve Goals: Good    Frequency Min 3X/week   Barriers to discharge        Co-evaluation               AM-PAC PT "6 Clicks" Daily Activity  Outcome Measure Difficulty turning over in bed (including adjusting bedclothes, sheets and blankets)?: A Little Difficulty moving from lying on back to sitting on the side of the bed? : Unable Difficulty sitting down on and standing up from a chair with arms (e.g., wheelchair, bedside commode, etc,.)?: Unable Help needed moving to and from a bed to chair (including a wheelchair)?: A Little Help needed walking in hospital room?: A Little Help needed climbing 3-5 steps with a railing? : A Little 6 Click Score: 14    End of Session   Activity Tolerance: Patient tolerated treatment well Patient left: in chair;with call bell/phone within reach;with chair alarm set Nurse Communication: Mobility status PT Visit Diagnosis: Other abnormalities of gait and mobility (R26.89);Unsteadiness on feet (R26.81)    Time: 1610-9604 PT Time Calculation (min) (ACUTE ONLY): 33 min   Charges:   PT Evaluation $PT Eval Moderate Complexity: 1 Mod PT Treatments $Gait Training: 8-22 mins   PT G Codes:        11/09/17  [place] Bing, PT [phone]  (pager)  Sara Dennis Nov 09, 2017, 5:28  PM

## 2017-10-21 NOTE — Plan of Care (Signed)
  Progressing Elimination: Will not experience complications related to bowel motility 10/21/2017 0236 - Progressing by Horris Latino, RN Note No complications noted.  Last BM last night. Will not experience complications related to urinary retention 10/21/2017 0236 - Progressing by Horris Latino, RN Note No complications noted.  Adequate UOP.

## 2017-10-21 NOTE — Progress Notes (Signed)
Inpatient Diabetes Program Recommendations  AACE/ADA: New Consensus Statement on Inpatient Glycemic Control (2015)  Target Ranges:  Prepandial:   less than 140 mg/dL      Peak postprandial:   less than 180 mg/dL (1-2 hours)      Critically ill patients:  140 - 180 mg/dL   Lab Results  Component Value Date   GLUCAP 306 (H) 10/21/2017   HGBA1C 7.3 (H) 02/06/2016   Review of Glycemic Control  Diabetes history: DM 2 Outpatient Diabetes medications: Lantus 35 BID per med rec but MD notes states 20 BID Current orders for Inpatient glycemic control: Lantus 12 units BID, Novolog Moderate 0-15 units tid  Inpatient Diabetes Program Recommendations:    Glucose in the 300's, patient also receiving steroids. Consider increasing Lantus to 20 units BID.  Thanks,  Christena Deem RN, MSN, [facility] Inpatient Diabetes Coordinator Team Pager [phone] (8a-5p)

## 2017-10-21 NOTE — Consult Note (Signed)
Cardiology Consultation:   Patient ID: Sara Dennis; [id number]; 11-Mar-1922   Admit date: 10/18/2017 Date of Consult: 10/21/2017  Primary Care Provider: Coralie Common, MD Primary Cardiologist: Dr. Rennis Golden (last office visit 09/15/2015) Primary Electrophysiologist:  N/A   Patient Profile:   Sara Dennis is a 84 y.o. female with a hx of chronic diastolic CHF, permanent atrial fibrillation/atrial flutter on Coumadin, moderate/severe pulmonary HTN by Echo 05/2015, RBBB, CKD stage III, history of recurrent PE (1978 and 2012), and dementia who is being seen today for the evaluation of acute on chronic diastolic CHF at the request of Dr. Rito Ehrlich.  History of Present Illness:   Sara Dennis presented to the ED on 2 days ago (10/19/2017) for evaluation of a non-productive cough for the past 2 weeks. At that time, she had generalized weakness but denied fever, chills, chest pain, shortness of breath, abdominal pain, or nausea/vomiting.  Chest x-ray showed pulmonary edema. During ED stay she got progressively more tachypneic and O2 sats dropped to 86% with ambulation. She was admitted for diuresis and repeat Echo.  Cardiology was consulted today because she has failed to improve with IV diuretics. Chest x-ray shows continued pulmonary edema with likely underlying pulmonary fibrosis.   Sara Dennis states she is feeling a lot better today. She is no longer having any trouble breathing. She is sleeping on an incline but she denies any PND. She denies any chest pain, shortness of breath, or lower leg edema. She states she had some lightheadedness and dizziness before she came to the ED but states that has resolved.    She still has some generalized weakness and cannot get out of bed much because she is so weak. However, her daughter reports that she is more alert today and has a better appetite today than she has the past several days. She does have dementia but patient's daughter states she seems more disoriented  than usual.   Her usual activity level is to walk a little with a walker, uses a wheelchair most of the time.    Past Medical History:  Diagnosis Date  . Arthritis    left leg  . Bradycardia    a. 05/2015: HR 40s-50s, occ 30s at night - not clear how much symptoms were r/t this, patient declined PPM at that time.  . Chronic atrial flutter (HCC)   . Chronic diastolic CHF (congestive heart failure) (HCC)   . CKD (chronic kidney disease), stage III (HCC)   . Diabetes mellitus   . Dyspnea   . Glaucoma    a. with significantly decreased vision.  . Hypothyroidism   . Mild dementia   . Moderate to severe pulmonary hypertension (HCC) By echo 05/2015  . Permanent atrial fibrillation (HCC)   . Pulmonary embolus (HCC) 03-23-11   a. 1978 and 2012.  Marland Kitchen RBBB   . UTI (lower urinary tract infection)     Past Surgical History:  Procedure Laterality Date  . CYSTOSCOPY W/ URETERAL STENT PLACEMENT  09/06/2011   Procedure: CYSTOSCOPY WITH RETROGRADE PYELOGRAM/URETERAL STENT PLACEMENT;  Surgeon: Valetta Fuller, MD;  Location: [facility];  Service: Urology;  Laterality: Right;  cysto double j stent placement  . EYE SURGERY    . kisney stone removal  1978   removed thru bladder  . URETEROSCOPY  09/06/2011   Procedure: URETEROSCOPY;  Surgeon: Valetta Fuller, MD;  Location: [facility];  Service: Urology;  Laterality: Right;     Prior to Admission medications   Medication  Sig Start Date End Date Taking? Authorizing Provider  atorvastatin (LIPITOR) 20 MG tablet Take 1 tablet (20 mg total) by mouth daily at 6 PM. 08/11/15  Yes Hilty, Lisette Abu, MD  donepezil (ARICEPT) 5 MG tablet Take 5 mg by mouth at bedtime.    Yes [provider]  furosemide (LASIX) 40 MG tablet Take 1 tablet (40 mg total) by mouth daily. 03/17/16  Yes Marinda Elk, MD  insulin glargine (LANTUS) 100 UNIT/ML injection Inject 0.3 mLs (30 Units total) into the skin 2 (two) times daily. 09/10/15  Yes Vann, Selinda Orion, DO    levothyroxine (SYNTHROID, LEVOTHROID) 50 MCG tablet Take 50 mcg by mouth daily before breakfast.   Yes [provider]  warfarin (COUMADIN) 4 MG tablet Take 2 tablets (8 mg total) by mouth daily. On 02/09/16 and 02/10/16 and then 4mg  daily starting 02/11/16 or as instructed by coumadin clinic Patient taking differently: Take 6 mg by mouth daily.  02/09/16  Yes Tat, Onalee Hua, MD  acetaminophen (TYLENOL) 325 MG tablet Take 2 tablets (650 mg total) by mouth every 6 (six) hours as needed for mild pain (or Fever >/= 101). 09/10/15   Joseph Art, DO  beta carotene w/minerals (OCUVITE) tablet Take 1 tablet by mouth daily.    [provider]  cyanocobalamin 500 MCG tablet Take 1 tablet (500 mcg total) by mouth daily. 02/08/16   Catarina Hartshorn, MD  traMADol (ULTRAM) 50 MG tablet Take 50 mg by mouth every 6 (six) hours as needed for moderate pain.     [provider]    Inpatient Medications: Scheduled Meds: . atorvastatin  20 mg Oral q1800  . donepezil  5 mg Oral QHS  . furosemide  40 mg Intravenous Q8H  . guaiFENesin  600 mg Oral BID  . insulin aspart  0-15 Units Subcutaneous TID WC  . insulin glargine  18 Units Subcutaneous BID  . levothyroxine  50 mcg Oral QAC breakfast  . methylPREDNISolone (SOLU-MEDROL) injection  40 mg Intravenous Q12H  . sodium chloride flush  3 mL Intravenous Q12H  . Warfarin - Pharmacist Dosing Inpatient   Does not apply q1800   Continuous Infusions: . sodium chloride    . cefTRIAXone (ROCEPHIN)  IV Stopped (10/21/17 0104)   PRN Meds: sodium chloride, acetaminophen, guaiFENesin-dextromethorphan, ondansetron (ZOFRAN) IV, sodium chloride flush  Allergies:    Allergies  Allergen Reactions  . Brilliant Blue Fcf Hives  . Ciprofloxacin Itching  . Gemfibrozil Nausea And Vomiting and Other (See Comments)    Sick and give urine infection  . Iohexol Itching  . Ivp Dye [Iodinated Diagnostic Agents] Itching    itching  . Bactrim Itching, Nausea And  Vomiting and Rash    And diarrhea  . Sulfamethoxazole-Trimethoprim Itching, Nausea And Vomiting and Rash    And diarrhea    Social History:   Social History   Socioeconomic History  . Marital status: Widowed    Spouse name: Not on file  . Number of children: Not on file  . Years of education: Not on file  . Highest education level: Not on file  Social Needs  . Financial resource strain: Not on file  . Food insecurity - worry: Not on file  . Food insecurity - inability: Not on file  . Transportation needs - medical: Not on file  . Transportation needs - non-medical: Not on file  Occupational History  . Occupation: [employer]  Tobacco Use  . Smoking status: Never Smoker  . Smokeless  tobacco: Never Used  Substance and Sexual Activity  . Alcohol use: No  . Drug use: No  . Sexual activity: No  Other Topics Concern  . Not on file  Social History Narrative   Pt lives with her daugher    Family History:   Family History  Problem Relation Age of Onset  . Hypertension Father    Family Status:  Family Status  Relation Name Status  . Mother  Deceased  . Father  Deceased  . Daughter  Alive    ROS:  Please see the history of present illness.  All other ROS reviewed and negative.     Physical Exam/Data:   Vitals:   10/20/17 1353 10/20/17 2007 10/21/17 0500 10/21/17 1338  BP: 139/77 135/70 (!) 148/55 113/60  Pulse: 78 70 (!) 57 60  Resp: Temp:  97.6 F (36.4 C) 98 F (36.7 C) (!) 97.4 F (36.3 C)  TempSrc: Oral Axillary Oral Oral  SpO2: 94% 93% 97% 99%  Weight:   210 lb 8.6 oz (95.5 kg)   Height:        Intake/Output Summary (Last 24 hours) at 10/21/2017 1605 Last data filed at 10/21/2017 0900 Gross per 24 hour  Intake 820 ml  Output 1000 ml  Net -180 ml   Filed Weights   10/19/17 0500 10/20/17 0655 10/21/17 0500  Weight: 212 lb 11.9 oz (96.5 kg) 209 lb 10.5 oz (95.1 kg) 210 lb 8.6 oz (95.5 kg)   Body mass index is 32.98 kg/m.  General:  84 y.o.  African-American female appears well nourished, well developed, sitting comfortably in bed in no acute distress HEENT: normal Lymph: no adenopathy Neck: no JVD Endocrine:  no thryomegaly Vascular: no carotid bruits; upper extremity pulses 2+, lower extremity pulses 1+  Cardiac:  normal S1, S2; RRR; no murmur Lungs:  decreased breath sounds with crackles bilaterally and rhonchi, no increased work of breathing Abd: soft, nontender, no hepatomegaly  Ext: no edema Musculoskeletal:  No deformities, BUE and BLE strength normal and equal Skin: warm and dry  Neuro:  CNs 2-12 intact, no focal abnormalities noted Psych:  Normal affect   EKG:  The EKG 10/20/2017 was personally reviewed and demonstrates:  Atrial fib, ventricular rate 65, wide QRS, RBBB, inverted T waves in lead II and V3-V6 Telemetry:  Telemetry was personally reviewed and demonstrates: Atrial fib, multiple pauses, slow VR at times  Relevant CV Studies:  ECHO 10/19/2017: Study Conclusions: - Left ventricle: The cavity size was normal. There was moderate   concentric hypertrophy. Systolic function was vigorous. The   estimated ejection fraction was in the range of 65% to 70%. Wall   motion was normal; there were no regional wall motion   abnormalities. - Ventricular septum: The contour showed diastolic flattening. - Aortic valve: Trileaflet; mildly thickened, mildly calcified   leaflets. There was mild regurgitation. - Right ventricle: The cavity size was moderately dilated. Wall   thickness was normal. Systolic function was moderately to   severely reduced. - Pulmonary arteries: Systolic pressure was severely increased. PA   peak pressure: 78 mm Hg (S). Impressions: - Compared to the prior study, there has been no significant   interval change.  ECHO 06/02/2015: Study Conclusions: - Left ventricle: The cavity size was normal. Wall thickness was   increased in a pattern of severe LVH. Systolic function was   normal. The  estimated ejection fraction was in the range of 60%   to 65%.  Wall motion was normal; there were no regional wall   motion abnormalities. Doppler parameters are consistent with   pseudonormal left ventricular relaxation (grade 2 diastolic   dysfunction). The E/A ratio is >1.5 and the E/e&' ratio is >15,   suggesting elevate LV filling pressure. - Ventricular septum: D-shaped septum which showed incoordinate   motion. The contour showed diastolic flattening and systolic   flattening. - Mitral valve: Calcified annulus. - Right ventricle: The cavity size was moderately dilated. - Right atrium: Moderately dilated at 25 cm2. - Tricuspid valve: There was moderate regurgitation. - Pulmonary arteries: PA peak pressure: 73 mm Hg + RAP. - Systemic veins: The IVC was not visualized. Impressions: - Compared to a prior echo in 2014, there is now severe LVH, the EF   is 60-65%, there is Stage 2 diastolic dysfunction with elevated   LV filling pressure. The ventricular septum is d-shaped and   demonstrates incoordinate motion, suggesting pressure and volume   overload of the RV. The RVSP is 73 mmHg + RAP (IVC not   visualized) which is moderate to more likely severe pulmonary   hypertension. There is moderate TR and moderate biatrial   enlargement.  Laboratory Data:  Chemistry Recent Labs  Lab 10/18/17 2305 10/20/17 0432 10/21/17 0446  NA 133* 136 134*  K 4.4 4.1 5.0  CL 96* 98* 95*  CO2 24 26 26   GLUCOSE 258* 174* 322*  BUN 18 22* 32*  CREATININE 1.35* 1.20* 1.27*  CALCIUM 8.6* 8.4* 8.6*  GFRNONAA 33* 38* 35*  GFRAA 38* 44* 41*  ANIONGAP 13 12 13     Lab Results  Component Value Date   ALT 26 10/18/2017   AST 45 (H) 10/18/2017   ALKPHOS 82 10/18/2017   BILITOT 1.1 10/18/2017   Hematology Recent Labs  Lab 10/18/17 2305 10/20/17 0432  WBC 12.5* 13.9*  RBC 5.82* 5.70*  HGB 15.2* 14.8  HCT 45.0 44.0  MCV 77.3* 77.2*  MCH 26.1 26.0  MCHC 33.8 33.6  RDW 16.7* 16.7*  PLT  246 279   Cardiac Enzymes none  BNPNo results for input(s): BNP, PROBNP in the last 168 hours.  Lab Results  Component Value Date   TSH 1.786 02/04/2016     Radiology/Studies:  Dg Chest 2 View  Result Date: 10/19/2017 CLINICAL DATA:  Cough for 2 weeks. Shortness of breath on exertion. History of chronic atrial flutter, CHF, diabetes, hypertension, pulmonary embolus. EXAM: CHEST  2 VIEW COMPARISON:  03/16/2016 FINDINGS: Cardiac enlargement with probable pulmonary vascular congestion. Interstitial changes in the lungs probably representing a combination of chronic fibrosis with superimposed interstitial edema. Interstitial changes appear more prominent than on the previous study. No pleural effusions. No pneumothorax. Tortuous aorta. Degenerative changes in the spine. IMPRESSION: Increasing cardiac enlargement and interstitial pattern since previous study likely represents progression of congestive failure and interstitial edema superimposed upon chronic fibrosis. Electronically Signed   By: Burman Nieves M.D.   On: 10/19/2017 00:34   Dg Chest Port 1 View  Result Date: 10/20/2017 CLINICAL DATA:  Worsening cough for 2 weeks. EXAM: PORTABLE CHEST 1 VIEW COMPARISON:  10/19/2017 FINDINGS: Shallow inspiration. Cardiac enlargement with some vascular congestion. Diffuse interstitial pattern throughout the lungs is similar to previous studies. This probably represents diffuse fibrosis with superimposed edema. Mild progression since the previous study. No evidence of pleural effusion or pneumothorax. Degenerative changes in the spine and shoulders. IMPRESSION: Shallow inspiration. Diffuse cardiac enlargement with diffuse interstitial pattern progressing since previous study. This likely represents  chronic fibrosis with superimposed edema. Electronically Signed   By: Burman Nieves M.D.   On: 10/20/2017 23:26    Assessment and Plan:   Principal Problem:   Acute on chronic diastolic CHF (congestive  heart failure) (HCC) Active Problems:   Diabetes mellitus (HCC)   Atrial flutter (HCC)   Acute respiratory failure with hypoxia (HCC)   Acute CHF (congestive heart failure) (HCC)  Principal Problem: 1. Acute on Chronic diastolic CHF - Patient states she is no longer having trouble breathing at rest. She is still coughing but it has improved, productive of small amt yellowish sputum. - Echo showed moderate concententric hypertrophy of LV with EF of 65-70% and no regional wall motion abnormalities. RV was moderately dilated and systolic function was moderately/severely reduced. PA peak pressure 78 mmHg. Compared to prior study in 2016, there were no significant changes. - Chest X-ray 10/20/2017 likely represents chronic fibrosis with superimposed edema with mild progression since previous study the day before - Continue Lasix but may be able to change to po soon. - Continue strict monitoring of I&Os, daily wts  Active Problems 2. Acute Hypoxic Respiratory Failure - Per IM, likely secondary to CHF but patient appears to have underlying interstitial process as well - SoluMedrol was started yesterday - agree with rx of non-CHF causes of SOB  3. Atrial Fibrillation hx chronic atrial flutter - HR is slow, no rate-lowering rx - pt has had bradycardia in the past, has refused PPM (08/2015 office note) - Continue Coumadin, most recent INR 3.57  4. CKD Stage III - BMP today: Cr 1.27 (up from 1.20 yesterday), K+ 5.0 (but may have been hemolyzed), GFR 41 (down from 44 yesterday) - Continue to monitor BMP with diuretic use   5. T2DM - Per IM  6. UTI  - Per IM  7. Hypothyroidism - Per IM, continue levothyroxine    For questions or updates, please contact [facility] Please consult [email or web address] for contact info under Cardiology/STEMI.   Signed, Marjie Skiff, student PA Theodore Demark, PA-C  10/21/2017 4:05 PM   History and all data above reviewed.  Patient examined.  I agree  with the findings as above.  The patient presents with acute SOB.  This has been happening for about one week.  She has not been having weight gain, edema, PND or orthopnea.  She is limited in her mobility by her age and her vision but she gets around in the house and was noticing that she was getting more SOB with this.  She has had cough and today it is productive of green sputum.  She did have reasonable response to diuresis but we are called because she still clinically is SOB.  She has no pain.  Of note she has chronic fib/flutter and is on warfarin.  She has preserved systolic function. The patient exam reveals LFY:[id number]  ,  Lungs: Diffuse fine crackles  ,  Abd: Positive bowel sounds, no rebound no guarding, Ext No edema  .  All available labs, radiology testing, previous records reviewed. Agree with documented assessment and plan. Acute on chronic diastolic HF:  While I would suspect that there is some component of volume, I would suspect that the primary problem is inflammation or infection on top of chronic lung disease.  Her CXR is abnormal dating back years and I would suspect a chronic underlying lung condition.  We can continue IV diuresis following her creat closely.  However, I would continue to aggressive manage a primary pulmonary  process.  Pulmonary HTN:  This also has been noted on previous echocardiograms.  This does not seem to be changed and is probably secondary to a lung process other than primary pulmonary HTN.    Fayrene Fearing Annasophia Crocker  4:34 PM  10/21/2017

## 2017-10-21 NOTE — Progress Notes (Signed)
[facility] PROGRESS NOTE  Sara Dennis:[id number] DOB: Nov 05, 1921 DOA: 10/18/2017  PCP: Dr. Kennith Center  Brief History/Interval Summary: 84 year old African-American female with a past medical history of atrial flutter, diabetes mellitus type 2, diastolic CHF, glaucoma, presented with 2-week history of cough shortness of breath.  Chest x-ray showed pulmonary edema.  Patient was admitted to the hospital and placed on diuretics.  Reason for Visit: Acute diastolic CHF  Consultants: Cardiology  Procedures:  Echocardiogram Study Conclusions - Left ventricle: The cavity size was normal. There was moderate   concentric hypertrophy. Systolic function was vigorous. The   estimated ejection fraction was in the range of 65% to 70%. Wall   motion was normal; there were no regional wall motion   abnormalities. - Ventricular septum: The contour showed diastolic flattening. - Aortic valve: Trileaflet; mildly thickened, mildly calcified   leaflets. There was mild regurgitation. - Right ventricle: The cavity size was moderately dilated. Wall   thickness was normal. Systolic function was moderately to   severely reduced. - Pulmonary arteries: Systolic pressure was severely increased. PA   peak pressure: 78 mm Hg (S). Impressions: - Compared to the prior study, there has been no significant   interval change.  Antibiotics: None  Subjective/Interval History: Overnight events noted.  Patient denies any chest pain currently.  States that her breathing is better.  No nausea or vomiting.     ROS: Denies any headaches.  Objective:  Vital Signs  Vitals:   10/20/17 0655 10/20/17 1353 10/20/17 2007 10/21/17 0500  BP: 135/89 139/77 135/70 (!) 148/55  Pulse: 83 78 70 (!) 57  Resp: 17 16 18 18   Temp: 98.2 F (36.8 C)  97.6 F (36.4 C) 98 F (36.7 C)  TempSrc: Oral Oral Axillary Oral  SpO2: 100% 94% 93% 97%  Weight: 95.1 kg (209 lb 10.5 oz)   95.5 kg (210 lb 8.6 oz)  Height:         Intake/Output Summary (Last 24 hours) at 10/21/2017 1031 Last data filed at 10/21/2017 0900 Gross per 24 hour  Intake 1060 ml  Output 1000 ml  Net 60 ml   Filed Weights   10/19/17 0500 10/20/17 0655 10/21/17 0500  Weight: 96.5 kg (212 lb 11.9 oz) 95.1 kg (209 lb 10.5 oz) 95.5 kg (210 lb 8.6 oz)   Telemetry shows multiple pauses with heart rate in the 50s.  General appearance: Awake alert.  In no distress Resp: Seems to have improved air entry today.  Still has crackles bilaterally.  No wheezing.  Mildly tachypneic. Cardio: S1-S2 is bradycardic regular. GI: Abdomen soft.  Nontender nondistended. Extremities: No Edema Neurologic: No obvious neurological deficits noted.  Lab Results:  Data Reviewed: I have personally reviewed following labs and imaging studies  CBC: Recent Labs  Lab 10/18/17 2305 10/20/17 0432  WBC 12.5* 13.9*  NEUTROABS 8.8*  --   HGB 15.2* 14.8  HCT 45.0 44.0  MCV 77.3* 77.2*  PLT 246 279    Basic Metabolic Panel: Recent Labs  Lab 10/18/17 2305 10/20/17 0432 10/21/17 0446  NA 133* 136 134*  K 4.4 4.1 5.0  CL 96* 98* 95*  CO2 24 26 26   GLUCOSE 258* 174* 322*  BUN 18 22* 32*  CREATININE 1.35* 1.20* 1.27*  CALCIUM 8.6* 8.4* 8.6*    GFR: Estimated Creatinine Clearance: 32.9 mL/min (A) (by C-G formula based on SCr of 1.27 mg/dL (H)).  Liver Function Tests: Recent Labs  Lab 10/18/17 2305  AST 45*  ALT  26  ALKPHOS 82  BILITOT 1.1  PROT 8.0  ALBUMIN 3.0*    Coagulation Profile: Recent Labs  Lab 10/18/17 2305 10/19/17 0443 10/20/17 0432 10/21/17 0446  INR 2.34 2.43 2.99 3.57    CBG: Recent Labs  Lab 10/20/17 0739 10/20/17 1138 10/20/17 1621 10/20/17 2117 10/21/17 0736  GLUCAP 186* 259* 344* 349* 306*      Radiology Studies: Dg Chest Port 1 View  Result Date: 10/20/2017 CLINICAL DATA:  Worsening cough for 2 weeks. EXAM: PORTABLE CHEST 1 VIEW COMPARISON:  10/19/2017 FINDINGS: Shallow inspiration. Cardiac  enlargement with some vascular congestion. Diffuse interstitial pattern throughout the lungs is similar to previous studies. This probably represents diffuse fibrosis with superimposed edema. Mild progression since the previous study. No evidence of pleural effusion or pneumothorax. Degenerative changes in the spine and shoulders. IMPRESSION: Shallow inspiration. Diffuse cardiac enlargement with diffuse interstitial pattern progressing since previous study. This likely represents chronic fibrosis with superimposed edema. Electronically Signed   By: Burman Nieves M.D.   On: 10/20/2017 23:26     Medications:  Scheduled: . atorvastatin  20 mg Oral q1800  . donepezil  5 mg Oral QHS  . furosemide  40 mg Intravenous Q8H  . guaiFENesin  600 mg Oral BID  . insulin aspart  0-15 Units Subcutaneous TID WC  . insulin glargine  18 Units Subcutaneous BID  . insulin glargine  6 Units Subcutaneous Once  . levothyroxine  50 mcg Oral QAC breakfast  . methylPREDNISolone (SOLU-MEDROL) injection  40 mg Intravenous Q12H  . sodium chloride flush  3 mL Intravenous Q12H  . Warfarin - Pharmacist Dosing Inpatient   Does not apply q1800   Continuous: . sodium chloride    . cefTRIAXone (ROCEPHIN)  IV Stopped (10/21/17 0104)   PFY:TWKMQK chloride, acetaminophen, guaiFENesin-dextromethorphan, ondansetron (ZOFRAN) IV, sodium chloride flush  Assessment/Plan:  Principal Problem:   Acute on chronic diastolic CHF (congestive heart failure) (HCC) Active Problems:   Diabetes mellitus (HCC)   Atrial flutter (HCC)   Acute respiratory failure with hypoxia (HCC)   Acute CHF (congestive heart failure) (HCC)    Acute CHF likely diastolic Echocardiogram is as above.  No significant change from echocardiogram of 2016.  So this is most likely diastolic CHF.  There could be a component of right heart failure as well.  However patient does not have significant lower extremity edema.  Venous thromboembolism is less likely  considering that patient is already anticoagulated with therapeutic INR.  Chest x-ray done last night continues to be edema along with underlying interstitial fibrosis.  Unclear if ins and outs are being charted accurately.  Since patient is not improving as expected we will go ahead and consult cardiology.  Continue IV Lasix for now. ECHO from 2016 did show grade 2 diastolic dysfunction.  There was also suggestion of pulmonary hypertension and left ventricular hypertrophy.  Strict ins and outs. Daily weights.  Acute hypoxic respiratory failure Most likely due to CHF as discussed above.  Patient appears to have an underlying interstitial process as well.  She has not required oxygen previously.  Echocardiogram does raise concern for pulmonary hypertension.  He could have underlying interstitial lung disease.  Patient denies any history of smoking.  The interstitial process could also be contributing to her dyspnea.  SoluMedrol was initiated yesterday.  Continue diuretics as discussed above.  At her advanced age she is likely not a candidate for invasive workup.  UTI Urine culture positive for Klebsiella. Initially did not appear like  patient was experiencing any symptoms. However due to her symptoms overnight with lethargy and diaphoresis patient was started on antibiotics which will be continued.   Chronic atrial fibrillation/atrial flutter Monitor heart rate closely.  Followed by cardiology, last seen in 2016.  She is on warfarin.  Diabetes mellitus type 2 She uses Lantus at home.  This was initiated at a lower dose.  Due to steroids her CBGs are elevated.  Will increase the dose of Lantus.  Continue SSI.   Hypothyroidism Continue levothyroxine.  Dementia Continue donepezil  DVT Prophylaxis: On warfarin    Code Status: DNR Family Communication: Discussed with patient.  No family at bedside. Disposition Plan: Management as outlined above.  Cardiology consulted.  Antibiotics initiated last  night.    LOS: 1 day   Osvaldo Shipper  [facility] Pager [phone] 10/21/2017, 10:31 AM  If 7PM-7AM, please contact night-coverage at [email or web address], password [facility]

## 2017-10-21 NOTE — Care Management Note (Addendum)
Case Management Note  Patient Details  Name: Sara Dennis MRN: [id number] Date of Birth: [birth year]  Subjective/Objective: Pt presented for Cough and  SOB- Acute on Chronic CHF. Initiated on IV Rocephin and IV Lasix. PTA from home with daughter Annice Pih. Pt has [employer] Medicare/ Medicaid- no difficulties with getting medications. Pt uses DME RW and lightweight wheelchair at home. PT/OT to consult for recommendations.                   Action/Plan: Per pt daughter Annice Pih assists with Medications. CM will continue to monitor for additional needs.   Expected Discharge Date:                  Expected Discharge Plan:  Home w Home Health Services  In-House Referral:  NA  Discharge planning Services  CM Consult  Post Acute Care Choice:   Durable Medical Equipment; Home Health Choice offered to:   Patient;Adult Children  DME Arranged:   Oxygen DME Agency:   [facility]  HH Arranged:   PT HH Agency:   [facility]  Status of Service:  Completed.  If discussed at Long Length of Stay Meetings, dates discussed:    Additional Comments: 1529 10-24-17 Tomi Bamberger, RN,BSN [phone] CM did speak with daughter Annice Pih and she has stated to have ambulance pick pt up at 5:00 pm. [facility] was notified of pick up time. Annice Pih has decided to take 2 portable tanks home via car. 02 is scheduled to be delivered from 3:00 pm -7:00 pm via [facility]. Pt can be connected to portable tank if the concentrator has not been delivered by the time [facility] gets patient home.    1152 10-24-17 Tomi Bamberger, RN, BSN [phone] CM was able to hear back from daughter and she chose [facility] for DME 02, [facility] to provide the Skilled PT Services. Referral made to Liaison [facility] and [facility] to begin within 24-48 hours post d/c. Pt will need ambulance transport home. CM trying to have 02 delivered to home prior to ambulance transport home.   1141 10-24-17 Tomi Bamberger, RN,BSN  [phone] Pt has now qualified for home 02 and will need HH PT Services with Ambulance Transport Home. CM did reach out to daughter to offer choice for DME and Agency Of choice. Awaiting to hear back from Daughter.  Gala Lewandowsky, RN 10/21/2017, 11:27 AM

## 2017-10-22 DIAGNOSIS — I4891 Unspecified atrial fibrillation: Secondary | ICD-10-CM

## 2017-10-22 DIAGNOSIS — J849 Interstitial pulmonary disease, unspecified: Secondary | ICD-10-CM

## 2017-10-22 DIAGNOSIS — I4892 Unspecified atrial flutter: Secondary | ICD-10-CM

## 2017-10-22 DIAGNOSIS — B961 Klebsiella pneumoniae [K. pneumoniae] as the cause of diseases classified elsewhere: Secondary | ICD-10-CM

## 2017-10-22 DIAGNOSIS — I2721 Secondary pulmonary arterial hypertension: Secondary | ICD-10-CM

## 2017-10-22 DIAGNOSIS — Z6839 Body mass index (BMI) 39.0-39.9, adult: Secondary | ICD-10-CM

## 2017-10-22 LAB — GLUCOSE, CAPILLARY
GLUCOSE-CAPILLARY: 279 mg/dL — AB (ref 65–99)
GLUCOSE-CAPILLARY: 291 mg/dL — AB (ref 65–99)

## 2017-10-22 LAB — BASIC METABOLIC PANEL
Anion gap: 11 (ref 5–15)
BUN: 44 mg/dL — ABNORMAL HIGH (ref 6–20)
CALCIUM: 8.9 mg/dL (ref 8.9–10.3)
CO2: 27 mmol/L (ref 22–32)
Chloride: 96 mmol/L — ABNORMAL LOW (ref 101–111)
Creatinine, Ser: 1.35 mg/dL — ABNORMAL HIGH (ref 0.44–1.00)
GFR, EST AFRICAN AMERICAN: 38 mL/min — AB (ref >60)
GFR, EST NON AFRICAN AMERICAN: 33 mL/min — AB (ref >60)
Glucose, Bld: 299 mg/dL — ABNORMAL HIGH (ref 65–99)
POTASSIUM: 4.7 mmol/L (ref 3.5–5.1)
SODIUM: 134 mmol/L — AB (ref 135–145)

## 2017-10-22 LAB — PROTIME-INR
INR: 4.03 — AB
PROTHROMBIN TIME: 38.9 s — AB (ref 11.4–15.2)

## 2017-10-22 MED ORDER — CEPHALEXIN 500 MG PO CAPS
500.0000 mg | ORAL_CAPSULE | Freq: Three times a day (TID) | ORAL | Status: DC
  Administered 2017-10-22 – 2017-10-24 (×7): 500 mg via ORAL
  Filled 2017-10-22 (×7): qty 1

## 2017-10-22 MED ORDER — PREDNISONE 20 MG PO TABS
40.0000 mg | ORAL_TABLET | Freq: Two times a day (BID) | ORAL | Status: DC
Start: 2017-10-23 — End: 2017-10-24
  Administered 2017-10-23 – 2017-10-24 (×4): 40 mg via ORAL
  Filled 2017-10-22 (×4): qty 2

## 2017-10-22 MED ORDER — FUROSEMIDE 10 MG/ML IJ SOLN
40.0000 mg | Freq: Two times a day (BID) | INTRAMUSCULAR | Status: DC
  Administered 2017-10-22 – 2017-10-23 (×2): 40 mg via INTRAVENOUS
  Filled 2017-10-22 (×2): qty 4

## 2017-10-22 NOTE — Progress Notes (Signed)
[facility] PROGRESS NOTE  Sara Dennis IOE:[id number] DOB: Jun 13, 1922 DOA: 10/18/2017  PCP: Dr. Kennith Center  Brief History/Interval Summary: 84 year old African-American female with a past medical history of atrial flutter, diabetes mellitus type 2, diastolic CHF, glaucoma, presented with 2-week history of cough shortness of breath.  Chest x-ray showed pulmonary edema.  Patient was admitted to the hospital and placed on diuretics.  Reason for Visit: Acute diastolic CHF.  Interstitial lung disease  Consultants: Cardiology  Procedures:  Echocardiogram Study Conclusions - Left ventricle: The cavity size was normal. There was moderate   concentric hypertrophy. Systolic function was vigorous. The   estimated ejection fraction was in the range of 65% to 70%. Wall   motion was normal; there were no regional wall motion   abnormalities. - Ventricular septum: The contour showed diastolic flattening. - Aortic valve: Trileaflet; mildly thickened, mildly calcified   leaflets. There was mild regurgitation. - Right ventricle: The cavity size was moderately dilated. Wall   thickness was normal. Systolic function was moderately to   severely reduced. - Pulmonary arteries: Systolic pressure was severely increased. PA   peak pressure: 78 mm Hg (S). Impressions: - Compared to the prior study, there has been no significant   interval change.  Antibiotics: None  Subjective/Interval History: Patient sitting up in the chair today.  States that she is feeling better.  Denies any chest pain.  Breathing has improved.    ROS: Denies any nausea or vomiting  Objective:  Vital Signs  Vitals:   10/21/17 1338 10/21/17 2002 10/21/17 2025 10/22/17 0505  BP: 113/60 97/77 (!) 164/90 129/89  Pulse: 60 66 62 (!) 52  Resp:  18 (!) 27 (!) 26  Temp: (!) 97.4 F (36.3 C) (!) 97.5 F (36.4 C)  97.6 F (36.4 C)  TempSrc: Oral Oral  Oral  SpO2: 99% 100% 91% 99%  Weight:    98.1 kg (216 lb 3.2 oz)    Height:        Intake/Output Summary (Last 24 hours) at 10/22/2017 1115 Last data filed at 10/22/2017 0805 Gross per 24 hour  Intake 720 ml  Output 450 ml  Net 270 ml   Filed Weights   10/20/17 0655 10/21/17 0500 10/22/17 0505  Weight: 95.1 kg (209 lb 10.5 oz) 95.5 kg (210 lb 8.6 oz) 98.1 kg (216 lb 3.2 oz)   Telemetry continues to show heart rate in the 50s with pauses.    General appearance: Awake alert.  In no distress. Resp: Improved air entry.  Continues to have crackles bilaterally.  No wheezing.  Effort appears to be normal today at least at rest.   Cardio: S1-S2 is bradycardic regular. GI: Abdomen soft.  Nontender nondistended.  Bowel sounds are present.  No masses organomegaly Extremities: No Edema Neurologic: No obvious neurological deficits noted.  Lab Results:  Data Reviewed: I have personally reviewed following labs and imaging studies  CBC: Recent Labs  Lab 10/18/17 2305 10/20/17 0432  WBC 12.5* 13.9*  NEUTROABS 8.8*  --   HGB 15.2* 14.8  HCT 45.0 44.0  MCV 77.3* 77.2*  PLT 246 279    Basic Metabolic Panel: Recent Labs  Lab 10/18/17 2305 10/20/17 0432 10/21/17 0446 10/22/17 0454  NA 133* 136 134* 134*  K 4.4 4.1 5.0 4.7  CL 96* 98* 95* 96*  CO2 24 26 26 27   GLUCOSE 258* 174* 322* 299*  BUN 18 22* 32* 44*  CREATININE 1.35* 1.20* 1.27* 1.35*  CALCIUM 8.6* 8.4* 8.6* 8.9  GFR: Estimated Creatinine Clearance: 31.3 mL/min (A) (by C-G formula based on SCr of 1.35 mg/dL (H)).  Liver Function Tests: Recent Labs  Lab 10/18/17 2305  AST 45*  ALT 26  ALKPHOS 82  BILITOT 1.1  PROT 8.0  ALBUMIN 3.0*    Coagulation Profile: Recent Labs  Lab 10/18/17 2305 10/19/17 0443 10/20/17 0432 10/21/17 0446 10/22/17 0454  INR 2.34 2.43 2.99 3.57 4.03*    CBG: Recent Labs  Lab 10/21/17 0736 10/21/17 1102 10/21/17 1618 10/21/17 2052 10/22/17 0721  GLUCAP 306* 372* 312* 254* 279*      Radiology Studies: Dg Chest Port 1 View  Result  Date: 10/20/2017 CLINICAL DATA:  Worsening cough for 2 weeks. EXAM: PORTABLE CHEST 1 VIEW COMPARISON:  10/19/2017 FINDINGS: Shallow inspiration. Cardiac enlargement with some vascular congestion. Diffuse interstitial pattern throughout the lungs is similar to previous studies. This probably represents diffuse fibrosis with superimposed edema. Mild progression since the previous study. No evidence of pleural effusion or pneumothorax. Degenerative changes in the spine and shoulders. IMPRESSION: Shallow inspiration. Diffuse cardiac enlargement with diffuse interstitial pattern progressing since previous study. This likely represents chronic fibrosis with superimposed edema. Electronically Signed   By: Burman Nieves M.D.   On: 10/20/2017 23:26     Medications:  Scheduled: . atorvastatin  20 mg Oral q1800  . donepezil  5 mg Oral QHS  . furosemide  40 mg Intravenous Q8H  . guaiFENesin  600 mg Oral BID  . insulin aspart  0-15 Units Subcutaneous TID WC  . insulin glargine  18 Units Subcutaneous BID  . levothyroxine  50 mcg Oral QAC breakfast  . methylPREDNISolone (SOLU-MEDROL) injection  40 mg Intravenous Q12H  . sodium chloride flush  3 mL Intravenous Q12H  . Warfarin - Pharmacist Dosing Inpatient   Does not apply q1800   Continuous: . sodium chloride    . cefTRIAXone (ROCEPHIN)  IV Stopped (10/22/17 0130)   TKZ:SWFUXN chloride, acetaminophen, guaiFENesin-dextromethorphan, ondansetron (ZOFRAN) IV, sodium chloride flush  Assessment/Plan:  Principal Problem:   Acute on chronic diastolic CHF (congestive heart failure) (HCC) Active Problems:   Diabetes mellitus (HCC)   Atrial flutter (HCC)   Acute respiratory failure with hypoxia (HCC)   Acute CHF (congestive heart failure) (HCC)    Acute CHF likely diastolic Echocardiogram is as above.  No significant change from echocardiogram of 2016.  So this is most likely diastolic CHF.  There could be a component of right heart failure as well.   However patient does not have significant lower extremity edema.  Venous thromboembolism is less likely considering that patient is already anticoagulated with therapeutic INR.  Chest x-ray predominantly shows findings suggestive of interstitial fibrosis along with some pulmonary edema.  Patient clinically appears to have improved over the last 48 hours.  She remains on intravenous Lasix and was also started on steroids.  We will cut back on dose of Lasix today.  Strict ins and outs daily weights.  ECHO from 2016 did show grade 2 diastolic dysfunction.  There was also suggestion of pulmonary hypertension and left ventricular hypertrophy.  Appreciate cardiology input.  Acute hypoxic respiratory failure This is likely multifactorial with a component of congestive heart failure as discussed above along with the primary pulmonary process.  She does appear to have interstitial lung disease.  This has been seen previously on imaging studies.  It does not appear like she has seen a pulmonologist for same.  She was started on steroids 2 days ago which appears to  be helping.  Continue diuretics at a lower dose as discussed above.  May benefit from referral to pulmonology as outpatient although at her advanced age she is likely not a candidate for invasive workup.  Continue to wean down oxygen as tolerated.  UTI Urine culture positive for Klebsiella. Initially did not appear like patient was experiencing any symptoms. However due to her symptoms overnight on 1/24 with lethargy and diaphoresis patient was started on antibiotics which will be continued.  Change to oral antibiotics.  Chronic atrial fibrillation/atrial flutter Monitor heart rate closely.  Followed by cardiology, last seen in 2016.  She is on warfarin.  INR is supratherapeutic.  Warfarin has been held.  Diabetes mellitus type 2 She uses Lantus at home.  This was initiated at a lower dose.  Due to steroids her CBGs are elevated.  Dose of Lantus was  increased with better control of CBG noted.  Continue SSI.  No recent HbA1c.  Can check tomorrow.  Hypothyroidism Continue levothyroxine.  Dementia Continue donepezil  DVT Prophylaxis: On warfarin    Code Status: DNR Family Communication: Discussed with patient.  Disposition Plan: Management as outlined above.  Seen by physical therapy and will need home health.  Change to oral antibiotics today.  Continue to wean down oxygen.  Hopefully change to oral diuretics tomorrow.    LOS: 2 days   Osvaldo Shipper  [facility] Pager [phone] 10/22/2017, 11:15 AM  If 7PM-7AM, please contact night-coverage at [email or web address], password [facility]

## 2017-10-22 NOTE — Progress Notes (Signed)
Progress Note  Patient Name: Sara Dennis Date of Encounter: 10/22/2017  Primary Cardiologist: Chrystie Nose, MD   Subjective   Reports feeling much better. Lying fully supine, breathing very comfortably. Unfortunately, in/out appear to be incompletely recorded. Her weight makes little sense, supposedly she has gained 6 pounds overnight. Her cough is now productive of looser, but still tenacious mucus  Inpatient Medications    Scheduled Meds: . atorvastatin  20 mg Oral q1800  . donepezil  5 mg Oral QHS  . furosemide  40 mg Intravenous Q12H  . guaiFENesin  600 mg Oral BID  . insulin aspart  0-15 Units Subcutaneous TID WC  . insulin glargine  18 Units Subcutaneous BID  . levothyroxine  50 mcg Oral QAC breakfast  . methylPREDNISolone (SOLU-MEDROL) injection  40 mg Intravenous Q12H  . sodium chloride flush  3 mL Intravenous Q12H  . Warfarin - Pharmacist Dosing Inpatient   Does not apply q1800   Continuous Infusions: . sodium chloride    . cefTRIAXone (ROCEPHIN)  IV Stopped (10/22/17 0130)   PRN Meds: sodium chloride, acetaminophen, guaiFENesin-dextromethorphan, ondansetron (ZOFRAN) IV, sodium chloride flush   Vital Signs    Vitals:   10/21/17 1338 10/21/17 2002 10/21/17 2025 10/22/17 0505  BP: 113/60 97/77 (!) 164/90 129/89  Pulse: 60 66 62 (!) 52  Resp:  18 (!) 27 (!) 26  Temp: (!) 97.4 F (36.3 C) (!) 97.5 F (36.4 C)  97.6 F (36.4 C)  TempSrc: Oral Oral  Oral  SpO2: 99% 100% 91% 99%  Weight:    216 lb 3.2 oz (98.1 kg)  Height:        Intake/Output Summary (Last 24 hours) at 10/22/2017 1154 Last data filed at 10/22/2017 0805 Gross per 24 hour  Intake 720 ml  Output 450 ml  Net 270 ml   Filed Weights   10/20/17 0655 10/21/17 0500 10/22/17 0505  Weight: 209 lb 10.5 oz (95.1 kg) 210 lb 8.6 oz (95.5 kg) 216 lb 3.2 oz (98.1 kg)    Telemetry    Atrial fibrillation with slow ventricular response - Personally Reviewed  ECG    Atrial fibrillation,  nonspecific IVCD, most closely resembling right bundle branch block and left anterior fascicular block, possible superimposed right ventricular hypertrophy - Personally Reviewed  Physical Exam  She is blind. Looks very comfortable lying on her left side, fully horizontal in bed. Obesity limits the exam GEN: No acute distress.   Neck: unable to see JVD Cardiac: irregular, no murmurs, rubs, or gallops.  Respiratory: diminished breath sounds in bases bilaterally, but I do not hear any rales or crackles or wheezes.Marland Kitchen GI: Soft, nontender, non-distended  MS: No edema; No deformity. Neuro:  Nonfocal except blindness Psych: Normal affect   Labs    Chemistry Recent Labs  Lab 10/18/17 2305 10/20/17 0432 10/21/17 0446 10/22/17 0454  NA 133* 136 134* 134*  K 4.4 4.1 5.0 4.7  CL 96* 98* 95* 96*  CO2 24 26 26 27   GLUCOSE 258* 174* 322* 299*  BUN 18 22* 32* 44*  CREATININE 1.35* 1.20* 1.27* 1.35*  CALCIUM 8.6* 8.4* 8.6* 8.9  PROT 8.0  --   --   --   ALBUMIN 3.0*  --   --   --   AST 45*  --   --   --   ALT 26  --   --   --   ALKPHOS 82  --   --   --  BILITOT 1.1  --   --   --   GFRNONAA 33* 38* 35* 33*  GFRAA 38* 44* 41* 38*  ANIONGAP Hematology Recent Labs  Lab 10/18/17 2305 10/20/17 0432  WBC 12.5* 13.9*  RBC 5.82* 5.70*  HGB 15.2* 14.8  HCT 45.0 44.0  MCV 77.3* 77.2*  MCH 26.1 26.0  MCHC 33.8 33.6  RDW 16.7* 16.7*  PLT 246 279    Cardiac EnzymesNo results for input(s): TROPONINI in the last 168 hours. No results for input(s): TROPIPOC in the last 168 hours.   BNPNo results for input(s): BNP, PROBNP in the last 168 hours.   DDimer No results for input(s): DDIMER in the last 168 hours.   Radiology    Dg Chest Port 1 View  Result Date: 10/20/2017 CLINICAL DATA:  Worsening cough for 2 weeks. EXAM: PORTABLE CHEST 1 VIEW COMPARISON:  10/19/2017 FINDINGS: Shallow inspiration. Cardiac enlargement with some vascular congestion. Diffuse interstitial pattern  throughout the lungs is similar to previous studies. This probably represents diffuse fibrosis with superimposed edema. Mild progression since the previous study. No evidence of pleural effusion or pneumothorax. Degenerative changes in the spine and shoulders. IMPRESSION: Shallow inspiration. Diffuse cardiac enlargement with diffuse interstitial pattern progressing since previous study. This likely represents chronic fibrosis with superimposed edema. Electronically Signed   By: Burman Nieves M.D.   On: 10/20/2017 23:26    Cardiac Studies   ECHO 10/19/2017: Study Conclusions: - Left ventricle: The cavity size was normal. There was moderate concentric hypertrophy. Systolic function was vigorous. The estimated ejection fraction was in the range of 65% to 70%. Wall motion was normal; there were no regional wall motion abnormalities. - Ventricular septum: The contour showed diastolic flattening. - Aortic valve: Trileaflet; mildly thickened, mildly calcified leaflets. There was mild regurgitation. - Right ventricle: The cavity size was moderately dilated. Wall thickness was normal. Systolic function was moderately to severely reduced. - Pulmonary arteries: Systolic pressure was severely increased. PA peak pressure: 78 mm Hg (S). Impressions: - Compared to the prior study, there has been no significant interval change.    Patient Profile     84 y.o. female with long-standing persistent atrial fibrillation, known severe pulmonary hypertension, preserved left ventricular systolic function with moderate LVH and presumed diastolic dysfunction presenting with hypoxia and a productive cough.  Assessment & Plan    1. CHF: Volume status is difficult to assess due to her body habitus, but she is able to lie flat in bed suggesting that she is not grossly hypervolemic. I don't think she needs additional diuresis.BNP might be helpful in the long run, but she does not have a baseline  value to compare to, except for one in 2014. Productive cough suggest there is a primary respiratory cause for her shortness of breath. 2. AFib: chronic slow ventricular response, not severely bradycardic, she has refused pacemaker in the past. She is anticoagulated with warfarin. 3. PAH: severe. Underlying causes could include sleep disordered breathing/obesity in addition to diastolic left heart failure. 4. CKD3: kidney function appears to be stable.  No further recommendations at this time. We will plan to make arrangements for outpatient follow-up in the cardiology clinic after her discharge.  For questions or updates, please contact [facility] Please consult [email or web address] for contact info under Cardiology/STEMI.      Signed, Thurmon Fair, MD  10/22/2017, 11:54 AM

## 2017-10-22 NOTE — Progress Notes (Signed)
ANTICOAGULATION CONSULT NOTE - Follow Up Consult  Pharmacy Consult for Coumadin Indication: atrial fibrillation  Allergies  Allergen Reactions  . Brilliant Blue Fcf Hives  . Ciprofloxacin Itching  . Gemfibrozil Nausea And Vomiting and Other (See Comments)    Sick and give urine infection  . Iohexol Itching  . Ivp Dye [Iodinated Diagnostic Agents] Itching    itching  . Bactrim Itching, Nausea And Vomiting and Rash    And diarrhea  . Sulfamethoxazole-Trimethoprim Itching, Nausea And Vomiting and Rash    And diarrhea    Patient Measurements: Height: 5\' 7"  (170.2 cm) Weight: 216 lb 3.2 oz (98.1 kg) IBW/kg (Calculated) : 61.6  Vital Signs: Temp: 97.6 F (36.4 C) (01/26 0505) Temp Source: Oral (01/26 0505) BP: 129/89 (01/26 0505) Pulse Rate: 52 (01/26 0505)  Labs: Recent Labs    10/20/17 0432 10/21/17 0446 10/22/17 0454  HGB 14.8  --   --   HCT 44.0  --   --   PLT 279  --   --   LABPROT 30.8* 35.4* 38.9*  INR 2.99 3.57 4.03*  CREATININE 1.20* 1.27* 1.35*    Estimated Creatinine Clearance: 31.3 mL/min (A) (by C-G formula based on SCr of 1.35 mg/dL (H)).  Assessment: 84 yr old female continues on Coumadin for atrial fibrillation. INR supratherapeutic at 4.03 PTA Coumadin: 6 mg daily, INR 2.34 on admit 1/22   6 mg given 1/23 > INR 2.99 2.5 mg given 1/24 > INR 3.57 Dose held 1/25 > INR 4.03  Day # 2 Ceftriaxone 1gm IV q24hrs for Klebsiella UTI.  Goal of Therapy:  INR 2-3 Monitor platelets by anticoagulation protocol: Yes   Plan:  No Coumadin today. Daily PT/INR.  [place], [place] [id number] 10/22/2017,8:55 AM

## 2017-10-23 LAB — BASIC METABOLIC PANEL
Anion gap: 12 (ref 5–15)
BUN: 43 mg/dL — AB (ref 6–20)
CHLORIDE: 97 mmol/L — AB (ref 101–111)
CO2: 25 mmol/L (ref 22–32)
CREATININE: 1.09 mg/dL — AB (ref 0.44–1.00)
Calcium: 8.9 mg/dL (ref 8.9–10.3)
GFR, EST AFRICAN AMERICAN: 49 mL/min — AB (ref >60)
GFR, EST NON AFRICAN AMERICAN: 42 mL/min — AB (ref >60)
Glucose, Bld: 269 mg/dL — ABNORMAL HIGH (ref 65–99)
POTASSIUM: 5.2 mmol/L — AB (ref 3.5–5.1)
SODIUM: 134 mmol/L — AB (ref 135–145)

## 2017-10-23 LAB — PROTIME-INR
INR: 2.85
PROTHROMBIN TIME: 29.7 s — AB (ref 11.4–15.2)

## 2017-10-23 LAB — GLUCOSE, CAPILLARY
GLUCOSE-CAPILLARY: 326 mg/dL — AB (ref 65–99)
Glucose-Capillary: 269 mg/dL — ABNORMAL HIGH (ref 65–99)
Glucose-Capillary: 290 mg/dL — ABNORMAL HIGH (ref 65–99)
Glucose-Capillary: 294 mg/dL — ABNORMAL HIGH (ref 65–99)

## 2017-10-23 MED ORDER — WARFARIN SODIUM 2.5 MG PO TABS
2.5000 mg | ORAL_TABLET | Freq: Once | ORAL | Status: AC
  Administered 2017-10-23: 2.5 mg via ORAL
  Filled 2017-10-23: qty 1

## 2017-10-23 MED ORDER — FUROSEMIDE 40 MG PO TABS
40.0000 mg | ORAL_TABLET | Freq: Every day | ORAL | Status: DC
  Administered 2017-10-23 – 2017-10-24 (×2): 40 mg via ORAL
  Filled 2017-10-23 (×2): qty 1

## 2017-10-23 MED ORDER — DIPHENHYDRAMINE HCL 12.5 MG/5ML PO ELIX
12.5000 mg | ORAL_SOLUTION | Freq: Every evening | ORAL | Status: DC | PRN
  Administered 2017-10-23 (×2): 12.5 mg via ORAL
  Filled 2017-10-23 (×2): qty 10

## 2017-10-23 NOTE — Progress Notes (Signed)
Progress Note  Patient Name: Sara Dennis Date of Encounter: 10/23/2017  Primary Cardiologist: Chrystie Nose, MD   Subjective   Denies any problems with breathing. Had difficulty falling asleep last night, but when she fell asleep she was able to lie flat throughout the entire night. No edema. Generally feels well. Was able to hold a coherent conversation with me while the monitor showed a heart rate in the 30s. Asymptomatic. Cough is not bothering her much anymore.  Inpatient Medications    Scheduled Meds: . atorvastatin  20 mg Oral q1800  . cephALEXin  500 mg Oral Q8H  . donepezil  5 mg Oral QHS  . furosemide  40 mg Intravenous Q12H  . guaiFENesin  600 mg Oral BID  . insulin aspart  0-15 Units Subcutaneous TID WC  . insulin glargine  18 Units Subcutaneous BID  . levothyroxine  50 mcg Oral QAC breakfast  . predniSONE  40 mg Oral BID WC  . sodium chloride flush  3 mL Intravenous Q12H  . warfarin  2.5 mg Oral ONCE-1800  . Warfarin - Pharmacist Dosing Inpatient   Does not apply q1800   Continuous Infusions: . sodium chloride     PRN Meds: sodium chloride, acetaminophen, diphenhydrAMINE, guaiFENesin-dextromethorphan, ondansetron (ZOFRAN) IV, sodium chloride flush   Vital Signs    Vitals:   10/22/17 0505 10/22/17 2035 10/23/17 0456 10/23/17 0639  BP: 129/89 135/88 140/86   Pulse: (!) 52 61 (!) 49   Resp: (!) 26 15 17    Temp: 97.6 F (36.4 C) 97.8 F (36.6 C) 98.1 F (36.7 C)   TempSrc: Oral Oral Oral   SpO2: 99% 99% 99%   Weight: 216 lb 3.2 oz (98.1 kg)   214 lb 1.1 oz (97.1 kg)  Height:        Intake/Output Summary (Last 24 hours) at 10/23/2017 0923 Last data filed at 10/23/2017 0826 Gross per 24 hour  Intake 243 ml  Output 850 ml  Net -607 ml   Filed Weights   10/21/17 0500 10/22/17 0505 10/23/17 0639  Weight: 210 lb 8.6 oz (95.5 kg) 216 lb 3.2 oz (98.1 kg) 214 lb 1.1 oz (97.1 kg)    Telemetry    Atrial fibrillation with slow ventricular response,  average 45-55 bpm, dipping down to the 30s especially at night - Personally Reviewed  ECG    No new tracing - Personally Reviewed  Physical Exam  obese GEN: No acute distress.   Neck: No JVD Cardiac: irregular, no murmurs, rubs, or gallops.  Respiratory: Clear to auscultation bilaterally. GI: Soft, nontender, non-distended  MS: No edema; No deformity. Neuro:  Nonfocal except blindness Psych: Normal affect   Labs    Chemistry Recent Labs  Lab 10/18/17 2305  10/21/17 0446 10/22/17 0454 10/23/17 0415  NA 133*   < > 134* 134* 134*  K 4.4   < > 5.0 4.7 5.2*  CL 96*   < > 95* 96* 97*  CO2 24   < > 26 27 25   GLUCOSE 258*   < > 322* 299* 269*  BUN 18   < > 32* 44* 43*  CREATININE 1.35*   < > 1.27* 1.35* 1.09*  CALCIUM 8.6*   < > 8.6* 8.9 8.9  PROT 8.0  --   --   --   --   ALBUMIN 3.0*  --   --   --   --   AST 45*  --   --   --   --  ALT 26  --   --   --   --   ALKPHOS 82  --   --   --   --   BILITOT 1.1  --   --   --   --   GFRNONAA 33*   < > 35* 33* 42*  GFRAA 38*   < > 41* 38* 49*  ANIONGAP 13   < > < > = values in this interval not displayed.     Hematology Recent Labs  Lab 10/18/17 2305 10/20/17 0432  WBC 12.5* 13.9*  RBC 5.82* 5.70*  HGB 15.2* 14.8  HCT 45.0 44.0  MCV 77.3* 77.2*  MCH 26.1 26.0  MCHC 33.8 33.6  RDW 16.7* 16.7*  PLT 246 279    Cardiac EnzymesNo results for input(s): TROPONINI in the last 168 hours. No results for input(s): TROPIPOC in the last 168 hours.   BNPNo results for input(s): BNP, PROBNP in the last 168 hours.   DDimer No results for input(s): DDIMER in the last 168 hours.   Radiology    No results found.  Cardiac Studies   ECHO1/23/2019: Study Conclusions: - Left ventricle: The cavity size was normal. There was moderate concentric hypertrophy. Systolic function was vigorous. The estimated ejection fraction was in the range of 65% to 70%. Wall motion was normal; there were no regional wall  motion abnormalities. - Ventricular septum: The contour showed diastolic flattening. - Aortic valve: Trileaflet; mildly thickened, mildly calcified leaflets. There was mild regurgitation. - Right ventricle: The cavity size was moderately dilated. Wall thickness was normal. Systolic function was moderately to severely reduced. - Pulmonary arteries: Systolic pressure was severely increased. PA peak pressure: 78 mm Hg (S). Impressions: - Compared to the prior study, there has been no significant interval change.    Patient Profile     84 y.o. female with long-standing persistent atrial fibrillation, known severe pulmonary hypertension, preserved left ventricular systolic function with moderate LVH and presumed diastolic dysfunction presenting with hypoxia and a productive cough.  Assessment & Plan    1. CHF: obesity limits the evaluation, but I think she is at euvolemic status. Stop intravenous diuretics. Probably a good idea to discharge home on a low dose of oral loop diuretic. Potassium tends to run high, I don't think she needs a potassium supplement. Recommend checking a BNP at discharge, which will be useful for baseline comparison with future episodes of shortness of breath. 2. AFib: chronic slow ventricular response, not severely bradycardic, she has refused pacemaker in the past. She is anticoagulated with warfarin. epeat it level INR 3. PAH: severe. Underlying causes could include sleep disordered breathing/obesity in addition to diastolic left heart failure. 4. CKD3: kidney function has improved further.     For questions or updates, please contact [facility] Please consult [email or web address] for contact info under Cardiology/STEMI.      Signed, Thurmon Fair, MD  10/23/2017, 9:23 AM

## 2017-10-23 NOTE — Progress Notes (Signed)
ANTICOAGULATION CONSULT NOTE - Follow Up Consult  Pharmacy Consult for Coumadin Indication: atrial fibrillation  Allergies  Allergen Reactions  . Brilliant Blue Fcf Hives  . Ciprofloxacin Itching  . Gemfibrozil Nausea And Vomiting and Other (See Comments)    Sick and give urine infection  . Iohexol Itching  . Ivp Dye [Iodinated Diagnostic Agents] Itching    itching  . Bactrim Itching, Nausea And Vomiting and Rash    And diarrhea  . Sulfamethoxazole-Trimethoprim Itching, Nausea And Vomiting and Rash    And diarrhea    Patient Measurements: Height: 5\' 7"  (170.2 cm) Weight: 214 lb 1.1 oz (97.1 kg) IBW/kg (Calculated) : 61.6  Vital Signs: Temp: 98.1 F (36.7 C) (01/27 0456) Temp Source: Oral (01/27 0456) BP: 140/86 (01/27 0456) Pulse Rate: 49 (01/27 0456)  Labs: Recent Labs    10/21/17 0446 10/22/17 0454 10/23/17 0415  LABPROT 35.4* 38.9* 29.7*  INR 3.57 4.03* 2.85  CREATININE 1.27* 1.35* 1.09*    Estimated Creatinine Clearance: 38.6 mL/min (A) (by C-G formula based on SCr of 1.09 mg/dL (H)).  Assessment: 84 yr old female continues on Coumadin for atrial fibrillation. INR supratherapeutic at 4.03 PTA Coumadin: 6 mg daily, INR 2.34 on admit 1/22   6 mg given 1/23 > INR 2.99 2.5 mg given 1/24 > INR 3.57 Dose held 1/25 > INR 4.03 Dose held 1/26 > INR 2.85  Day # 3 Ceftriaxone 1gm IV q24hrs for Klebsiella UTI.  Goal of Therapy:  INR 2-3 Monitor platelets by anticoagulation protocol: Yes   Plan:  Coumadin 2.5 mg x 1 tonight Daily PT/INR.  [place], [place] [id number] 10/23/2017,8:15 AM

## 2017-10-23 NOTE — Progress Notes (Signed)
[facility] PROGRESS NOTE  Sara Dennis JQG:[id number] DOB: [birth year] DOA: 10/18/2017  PCP: Dr. Kennith Center  Brief History/Interval Summary: 84 year old African-American female with a past medical history of atrial flutter, diabetes mellitus type 2, diastolic CHF, glaucoma, presented with 2-week history of cough shortness of breath.  Chest x-ray showed pulmonary edema.  Patient was admitted to the hospital and placed on diuretics.  Reason for Visit: Acute diastolic CHF.  Interstitial lung disease  Consultants: Cardiology  Procedures:  Echocardiogram Study Conclusions - Left ventricle: The cavity size was normal. There was moderate   concentric hypertrophy. Systolic function was vigorous. The   estimated ejection fraction was in the range of 65% to 70%. Wall   motion was normal; there were no regional wall motion   abnormalities. - Ventricular septum: The contour showed diastolic flattening. - Aortic valve: Trileaflet; mildly thickened, mildly calcified   leaflets. There was mild regurgitation. - Right ventricle: The cavity size was moderately dilated. Wall   thickness was normal. Systolic function was moderately to   severely reduced. - Pulmonary arteries: Systolic pressure was severely increased. PA   peak pressure: 78 mm Hg (S). Impressions: - Compared to the prior study, there has been no significant   interval change.  Antibiotics: None  Subjective/Interval History: Patient states that she is feeling much better today.  Denies any shortness of breath at this time.  Denies any chest pain.  No nausea or vomiting.      ROS: No dizziness or lightheadedness  Objective:  Vital Signs  Vitals:   10/22/17 0505 10/22/17 2035 10/23/17 0456 10/23/17 0639  BP: 129/89 135/88 140/86   Pulse: (!) 52 61 (!) 49   Resp: (!) 26 15 17    Temp: 97.6 F (36.4 C) 97.8 F (36.6 C) 98.1 F (36.7 C)   TempSrc: Oral Oral Oral   SpO2: 99% 99% 99%   Weight: 98.1 kg (216 lb 3.2 oz)    97.1 kg (214 lb 1.1 oz)  Height:        Intake/Output Summary (Last 24 hours) at 10/23/2017 1124 Last data filed at 10/23/2017 1000 Gross per 24 hour  Intake 483 ml  Output 850 ml  Net -367 ml   Filed Weights   10/21/17 0500 10/22/17 0505 10/23/17 0639  Weight: 95.5 kg (210 lb 8.6 oz) 98.1 kg (216 lb 3.2 oz) 97.1 kg (214 lb 1.1 oz)   Telemetry continues to show heart rate in the 50s with a few episodes of pauses.  General appearance: Awake alert.  In no distress. Resp: Improved air entry bilaterally.  Continues to have crackles which are coarse.  No fine crackles appreciated.  Effort is normal today.     Cardio: S1-S2 is bradycardic regular. GI: Soft.  Nontender nondistended. Extremities: No Edema Neurologic: No obvious neurological deficits noted.  Lab Results:  Data Reviewed: I have personally reviewed following labs and imaging studies  CBC: Recent Labs  Lab 10/18/17 2305 10/20/17 0432  WBC 12.5* 13.9*  NEUTROABS 8.8*  --   HGB 15.2* 14.8  HCT 45.0 44.0  MCV 77.3* 77.2*  PLT 246 279    Basic Metabolic Panel: Recent Labs  Lab 10/18/17 2305 10/20/17 0432 10/21/17 0446 10/22/17 0454 10/23/17 0415  NA 133* 136 134* 134* 134*  K 4.4 4.1 5.0 4.7 5.2*  CL 96* 98* 95* 96* 97*  CO2 24 26 26 27 25   GLUCOSE 258* 174* 322* 299* 269*  BUN 18 22* 32* 44* 43*  CREATININE 1.35* 1.20*  1.27* 1.35* 1.09*  CALCIUM 8.6* 8.4* 8.6* 8.9 8.9    GFR: Estimated Creatinine Clearance: 38.6 mL/min (A) (by C-G formula based on SCr of 1.09 mg/dL (H)).  Liver Function Tests: Recent Labs  Lab 10/18/17 2305  AST 45*  ALT 26  ALKPHOS 82  BILITOT 1.1  PROT 8.0  ALBUMIN 3.0*    Coagulation Profile: Recent Labs  Lab 10/19/17 0443 10/20/17 0432 10/21/17 0446 10/22/17 0454 10/23/17 0415  INR 2.43 2.99 3.57 4.03* 2.85    CBG: Recent Labs  Lab 10/21/17 1618 10/21/17 2052 10/22/17 0721 10/22/17 1632 10/23/17 0827  GLUCAP 312* 254* 279* 291* 269*      Radiology  Studies: No results found.   Medications:  Scheduled: . atorvastatin  20 mg Oral q1800  . cephALEXin  500 mg Oral Q8H  . donepezil  5 mg Oral QHS  . furosemide  40 mg Oral Daily  . guaiFENesin  600 mg Oral BID  . insulin aspart  0-15 Units Subcutaneous TID WC  . insulin glargine  18 Units Subcutaneous BID  . levothyroxine  50 mcg Oral QAC breakfast  . predniSONE  40 mg Oral BID WC  . sodium chloride flush  3 mL Intravenous Q12H  . warfarin  2.5 mg Oral ONCE-1800  . Warfarin - Pharmacist Dosing Inpatient   Does not apply q1800   Continuous: . sodium chloride     ZOX:WRUEAV chloride, acetaminophen, diphenhydrAMINE, guaiFENesin-dextromethorphan, ondansetron (ZOFRAN) IV, sodium chloride flush  Assessment/Plan:  Principal Problem:   Acute on chronic diastolic CHF (congestive heart failure) (HCC) Active Problems:   Diabetes mellitus (HCC)   Atrial flutter (HCC)   Acute respiratory failure with hypoxia (HCC)   Acute CHF (congestive heart failure) (HCC)    Acute CHF likely diastolic Echocardiogram is as above.  No significant change from echocardiogram of 2016.  So this was most likely diastolic CHF.  There could be a component of right heart failure as well.  However patient does not have significant lower extremity edema.  Venous thromboembolism is less likely considering that patient is already anticoagulated with therapeutic INR.  Chest x-ray predominantly shows findings suggestive of interstitial fibrosis along with some pulmonary edema.  She has significantly improved over the last 3 days.  Change her over to oral diuretics.  Elevated potassium level most likely is due to hemolysis.  Repeat labs tomorrow.  Check BNP tomorrow as recommended by cardiology.  Strict ins and outs daily weights.  ECHO from 2016 did show grade 2 diastolic dysfunction.  There was also suggestion of pulmonary hypertension and left ventricular hypertrophy.  Appreciate cardiology input.  Acute hypoxic  respiratory failure This is likely multifactorial with a component of congestive heart failure as discussed above along with the primary pulmonary process.  She does appear to have interstitial lung disease.  This has been seen previously on imaging studies.  She was started on steroids 3 days ago which appears to be helping.  Will change to oral steroids today.  Change to oral Lasix.  Continue to wean down oxygen as tolerated.  Interstitial lung disease Imaging studies have suggested interstitial changes in both lungs.  Patient has never been diagnosed with any specific condition previously.  She has never been seen by pulmonology.  Patient symptoms have improved with initiation of steroids.  She might benefit from seeing a pulmonologist as an outpatient.  Unclear if she is a candidate for invasive testing.  UTI with Klebsiella Urine culture positive for Klebsiella. Initially did not  appear like patient was experiencing any symptoms. However due to her symptoms overnight on 1/24 with lethargy and diaphoresis patient was started on antibiotics which will be continued.  Changed to Keflex yesterday which will be continued.  Chronic atrial fibrillation/atrial flutter Noted to have slow heart rate.  Previously has refused pacemaker.  Cardiology has been following.  She has been asymptomatic.  Continue anticoagulation with warfarin.  INR is improved today.  Diabetes mellitus type 2 She uses Lantus at home.  This was initiated at a lower dose.  Due to steroids her CBGs are elevated.  Dose of Lantus was increased with better control of CBG noted.  Continue SSI.  No recent HbA1c.  Can check tomorrow.  Hypothyroidism Continue levothyroxine.  Dementia Continue donepezil  Chronic kidney disease stage III Renal function close to baseline.  Continue to monitor urine output.  DVT Prophylaxis: On warfarin    Code Status: DNR Family Communication: Discussed with patient.  Disposition Plan: Management as  outlined above.  Seen by physical therapy and will need home health.  Continue to wean down oxygen.    LOS: 3 days   Osvaldo Shipper  [facility] Pager [phone] 10/23/2017, 11:24 AM  If 7PM-7AM, please contact night-coverage at [email or web address], password [facility]

## 2017-10-23 NOTE — Progress Notes (Signed)
Weaned O2 as ordered.  O2 sat has been high 90s on RA during rest and 88-89% with activity (standing). Pt denies difficulty breathing or SOB.  Hinton Dyer, RN

## 2017-10-24 LAB — HEMOGLOBIN A1C
Hgb A1c MFr Bld: 10.1 % — ABNORMAL HIGH (ref 4.8–5.6)
Mean Plasma Glucose: 243.17 mg/dL

## 2017-10-24 LAB — BASIC METABOLIC PANEL
Anion gap: 11 (ref 5–15)
BUN: 30 mg/dL — AB (ref 6–20)
CALCIUM: 9 mg/dL (ref 8.9–10.3)
CO2: 30 mmol/L (ref 22–32)
CREATININE: 0.88 mg/dL (ref 0.44–1.00)
Chloride: 98 mmol/L — ABNORMAL LOW (ref 101–111)
GFR calc Af Amer: >60 mL/min (ref >60)
GFR calc non Af Amer: 55 mL/min — ABNORMAL LOW (ref >60)
GLUCOSE: 184 mg/dL — AB (ref 65–99)
Potassium: 4.2 mmol/L (ref 3.5–5.1)
Sodium: 139 mmol/L (ref 135–145)

## 2017-10-24 LAB — GLUCOSE, CAPILLARY
GLUCOSE-CAPILLARY: 164 mg/dL — AB (ref 65–99)
GLUCOSE-CAPILLARY: 275 mg/dL — AB (ref 65–99)
Glucose-Capillary: 182 mg/dL — ABNORMAL HIGH (ref 65–99)

## 2017-10-24 LAB — PROTIME-INR
INR: 2.12
Prothrombin Time: 23.5 seconds — ABNORMAL HIGH (ref 11.4–15.2)

## 2017-10-24 LAB — BRAIN NATRIURETIC PEPTIDE: B NATRIURETIC PEPTIDE 5: 566 pg/mL — AB (ref 0.0–100.0)

## 2017-10-24 MED ORDER — CEPHALEXIN 500 MG PO CAPS: 500.0000 mg | ORAL_CAPSULE | Freq: Three times a day (TID) | ORAL | 0 refills | Status: AC

## 2017-10-24 MED ORDER — PREDNISONE 20 MG PO TABS: ORAL_TABLET | ORAL | 0 refills | Status: DC

## 2017-10-24 MED ORDER — WARFARIN SODIUM 4 MG PO TABS: 4.0000 mg | ORAL_TABLET | Freq: Every day | ORAL | 0 refills | Status: AC

## 2017-10-24 MED ORDER — GUAIFENESIN ER 600 MG PO TB12: 600.0000 mg | ORAL_TABLET | Freq: Two times a day (BID) | ORAL | 0 refills | Status: DC

## 2017-10-24 MED ORDER — WARFARIN SODIUM 4 MG PO TABS
4.0000 mg | ORAL_TABLET | Freq: Once | ORAL | Status: AC
  Administered 2017-10-24: 4 mg via ORAL
  Filled 2017-10-24: qty 1

## 2017-10-24 NOTE — Progress Notes (Signed)
Appears compensated, probably euvolemic. Would consider today's weight 209 lb and BNP 566 her "baseline".  DC on current diuretic dose and follow up in 1-2 weeks in clinic. Daily weight monitoring and bring log to appt.  Thurmon Fair, MD, [facility] [phone] office [phone] pager

## 2017-10-24 NOTE — Discharge Summary (Signed)
[facility]  Physician Discharge Summary   Patient ID: Sara Dennis MRN: [id number] DOB/AGE: [birth year] 84 y.o.  Admit date: 10/18/2017 Discharge date: 10/24/2017  PCP: Coralie Common, MD  DISCHARGE DIAGNOSES:  Principal Problem:   Acute on chronic diastolic CHF (congestive heart failure) (HCC) Active Problems:   Diabetes mellitus (HCC)   Atrial flutter (HCC)   Acute respiratory failure with hypoxia (HCC)   Acute CHF (congestive heart failure) (HCC)   RECOMMENDATIONS FOR OUTPATIENT FOLLOW UP: 1. PCP to consider referring patient to pulmonology for further evaluation of pulmonary fibrosis 2. Home health ordered 3. She will need home oxygen which has been ordered   DISCHARGE CONDITION: fair  Diet recommendation: As before  Filed Weights   10/22/17 0505 10/23/17 0639 10/24/17 0615  Weight: 98.1 kg (216 lb 3.2 oz) 97.1 kg (214 lb 1.1 oz) 94.9 kg (209 lb 3.5 oz)    INITIAL HISTORY: 84 year old African-American female with a past medical history of atrial flutter, diabetes mellitus type 2, diastolic CHF, glaucoma, presented with 2-week history of cough shortness of breath.  Chest x-ray showed pulmonary edema.  Patient was admitted to the hospital and placed on diuretics  Consultants: Cardiology  Procedures:  Echocardiogram Study Conclusions - Left ventricle: The cavity size was normal. There was moderate concentric hypertrophy. Systolic function was vigorous. The estimated ejection fraction was in the range of 65% to 70%. Wall motion was normal; there were no regional wall motion abnormalities. - Ventricular septum: The contour showed diastolic flattening. - Aortic valve: Trileaflet; mildly thickened, mildly calcified leaflets. There was mild regurgitation. - Right ventricle: The cavity size was moderately dilated. Wall thickness was normal. Systolic function was moderately to severely reduced. - Pulmonary arteries: Systolic pressure was  severely increased. PA peak pressure: 78 mm Hg (S). Impressions: - Compared to the prior study, there has been no significant interval change.   HOSPITAL COURSE:   Acute CHF likely diastolic Echocardiogram is as above.  No significant change from echocardiogram of 2016.  So this was most likely diastolic CHF.  There could be a component of right heart failure as well.  However patient does not have significant lower extremity edema.  Venous thromboembolism is less likely considering that patient is already anticoagulated with therapeutic INR.  Chest x-ray predominantly shows findings suggestive of interstitial fibrosis along with some pulmonary edema.    She was also started on steroids.  Patient has significantly improved.  She has been changed over to oral diuretics.  Cardiology will arrange outpatient follow-up. ECHO from 2016 did show grade 2 diastolic dysfunction.  There was also suggestion of pulmonary hypertension and left ventricular hypertrophy.   Acute hypoxic respiratory failure This is likely multifactorial with a component of congestive heart failure as discussed above along with the primary pulmonary process.  She does appear to have interstitial lung disease.  This has been seen previously on imaging studies.  She was started on steroids which appears to be helping.  Oxygenation has improved.  However she will still need home oxygen based on testing.  Interstitial lung disease Imaging studies have suggested interstitial changes in both lungs.  Patient has never been diagnosed with any specific condition previously.  She has never been seen by pulmonology.  Patient symptoms have improved with initiation of steroids.  Will discharge on tapering doses of steroids.  She will need follow-up with primary care provider and may benefit from referral to  pulmonology.  This was discussed with patient and her daughter.  UTI with Klebsiella Urine culture positive for Klebsiella.  Treated  initially with ceftriaxone.  Changed over to Keflex.  Chronic atrial fibrillation/atrial flutter Noted to have slow heart rate.  Previously has refused pacemaker. She has been asymptomatic.  Continue anticoagulation with warfarin.  She will need INR to be checked on Thursday.  She is followed by Coumadin clinic in [place].  Her daughter checks her INR at home as well.  Diabetes mellitus type 2 She uses Lantus at home.  This was initiated at a lower dose.  Due to steroids her CBGs are elevated.  Dose of Lantus was increased with better control of CBG noted.   HBA1c is 10.   Hypothyroidism Continue levothyroxine.  Dementia Continue donepezil  Chronic kidney disease stage III Renal function close to baseline.   Overall stable.  Discussed in detail with patient and her daughter.  Patient feels much better.  She has improved.  Okay for discharge home today.  Home health will be ordered.    PERTINENT LABS:  The results of significant diagnostics from this hospitalization (including imaging, microbiology, ancillary and laboratory) are listed below for reference.    Microbiology: Recent Results (from the past 240 hour(s))  Urine culture     Status: Abnormal   Collection Time: 10/19/17 12:58 AM  Result Value Ref Range Status   Specimen Description URINE, CATHETERIZED  Final   Special Requests NONE  Final   Culture >=100,000 COLONIES/mL KLEBSIELLA PNEUMONIAE (A)  Final   Report Status 10/21/2017 FINAL  Final   Organism ID, Bacteria KLEBSIELLA PNEUMONIAE (A)  Final      Susceptibility   Klebsiella pneumoniae - MIC*    AMPICILLIN >=32 RESISTANT Resistant     CEFAZOLIN <=4 SENSITIVE Sensitive     CEFTRIAXONE <=1 SENSITIVE Sensitive     CIPROFLOXACIN <=0.25 SENSITIVE Sensitive     GENTAMICIN <=1 SENSITIVE Sensitive     IMIPENEM <=0.25 SENSITIVE Sensitive     NITROFURANTOIN 128 RESISTANT Resistant     TRIMETH/SULFA <=20 SENSITIVE Sensitive     AMPICILLIN/SULBACTAM 4  SENSITIVE Sensitive     PIP/TAZO <=4 SENSITIVE Sensitive     Extended ESBL NEGATIVE Sensitive     * >=100,000 COLONIES/mL KLEBSIELLA PNEUMONIAE     Labs: Basic Metabolic Panel: Recent Labs  Lab 10/20/17 0432 10/21/17 0446 10/22/17 0454 10/23/17 0415 10/24/17 0644  NA 136 134* 134* 134* 139  K 4.1 5.0 4.7 5.2* 4.2  CL 98* 95* 96* 97* 98*  CO2 26 26 27 25 30   GLUCOSE 174* 322* 299* 269* 184*  BUN 22* 32* 44* 43* 30*  CREATININE 1.20* 1.27* 1.35* 1.09* 0.88  CALCIUM 8.4* 8.6* 8.9 8.9 9.0   Liver Function Tests: Recent Labs  Lab 10/18/17 2305  AST 45*  ALT 26  ALKPHOS 82  BILITOT 1.1  PROT 8.0  ALBUMIN 3.0*   CBC: Recent Labs  Lab 10/18/17 2305 10/20/17 0432  WBC 12.5* 13.9*  NEUTROABS 8.8*  --   HGB 15.2* 14.8  HCT 45.0 44.0  MCV 77.3* 77.2*  PLT 246 279   BNP: BNP (last 3 results) Recent Labs    10/24/17 0644  BNP 566.0*    CBG: Recent Labs  Lab 10/23/17 1129 10/23/17 1647 10/23/17 2020 10/24/17 0739 10/24/17 1140  GLUCAP 326* 290* 294* 164* 182*     IMAGING STUDIES Dg Chest 2 View  Result Date: 10/19/2017 CLINICAL DATA:  Cough for 2 weeks. Shortness of breath on exertion. History of chronic atrial flutter, CHF, diabetes, hypertension,  pulmonary embolus. EXAM: CHEST  2 VIEW COMPARISON:  03/16/2016 FINDINGS: Cardiac enlargement with probable pulmonary vascular congestion. Interstitial changes in the lungs probably representing a combination of chronic fibrosis with superimposed interstitial edema. Interstitial changes appear more prominent than on the previous study. No pleural effusions. No pneumothorax. Tortuous aorta. Degenerative changes in the spine. IMPRESSION: Increasing cardiac enlargement and interstitial pattern since previous study likely represents progression of congestive failure and interstitial edema superimposed upon chronic fibrosis. Electronically Signed   By: Burman Nieves M.D.   On: 10/19/2017 00:34   Dg Chest Port 1  View  Result Date: 10/20/2017 CLINICAL DATA:  Worsening cough for 2 weeks. EXAM: PORTABLE CHEST 1 VIEW COMPARISON:  10/19/2017 FINDINGS: Shallow inspiration. Cardiac enlargement with some vascular congestion. Diffuse interstitial pattern throughout the lungs is similar to previous studies. This probably represents diffuse fibrosis with superimposed edema. Mild progression since the previous study. No evidence of pleural effusion or pneumothorax. Degenerative changes in the spine and shoulders. IMPRESSION: Shallow inspiration. Diffuse cardiac enlargement with diffuse interstitial pattern progressing since previous study. This likely represents chronic fibrosis with superimposed edema. Electronically Signed   By: Burman Nieves M.D.   On: 10/20/2017 23:26    DISCHARGE EXAMINATION: Vitals:   10/23/17 2016 10/24/17 0615 10/24/17 1100 10/24/17 1105  BP: (!) 144/84 (!) 143/47    Pulse: (!) 51 (!) 56  70  Resp: 19 (!) 22    Temp: 97.9 F (36.6 C) 97.8 F (36.6 C)    TempSrc: Oral Oral    SpO2: 97% 97% (!) 86% 93%  Weight:  94.9 kg (209 lb 3.5 oz)    Height:       General appearance: alert, cooperative, appears stated age and no distress Resp: Coarse crackles bilaterally.  Improved air entry compared to time of admission.  No wheezing Cardio: S1-S2 is bradycardic and regular.   GI: soft, non-tender; bowel sounds normal; no masses,  no organomegaly Extremities: extremities normal, atraumatic, no cyanosis or edema  DISPOSITION: Home with home health  Discharge Instructions    (HEART FAILURE PATIENTS) Call MD:  Anytime you have any of the following symptoms: 1) 3 pound weight gain in 24 hours or 5 pounds in 1 week 2) shortness of breath, with or without a dry hacking cough 3) swelling in the hands, feet or stomach 4) if you have to sleep on extra pillows at night in order to breathe.   Complete by:  As directed    Call MD for:  extreme fatigue   Complete by:  As directed    Call MD for:   persistant dizziness or light-headedness   Complete by:  As directed    Call MD for:  persistant nausea and vomiting   Complete by:  As directed    Call MD for:  severe uncontrolled pain   Complete by:  As directed    Call MD for:  temperature >100.4   Complete by:  As directed    Diet Carb Modified   Complete by:  As directed    Discharge instructions   Complete by:  As directed    Please follow up with your coumadin clinic on Thursday to check INR. Please see your doctor in 1 week and discuss referral to pulmonology (lung doctor).  You were cared for by a hospitalist during your hospital stay. If you have any questions about your discharge medications or the care you received while you were in the hospital after you are discharged, you can  call the unit and asked to speak with the hospitalist on call if the hospitalist that took care of you is not available. Once you are discharged, your primary care physician will handle any further medical issues. Please note that NO REFILLS for any discharge medications will be authorized once you are discharged, as it is imperative that you return to your primary care physician (or establish a relationship with a primary care physician if you do not have one) for your aftercare needs so that they can reassess your need for medications and monitor your lab values. If you do not have a primary care physician, you can call [phone] for a physician referral.   Increase activity slowly   Complete by:  As directed         Allergies as of 10/24/2017      Reactions   Brilliant Blue Fcf Hives   Ciprofloxacin Itching   Gemfibrozil Nausea And Vomiting, Other (See Comments)   Sick and give urine infection   Iohexol Itching   Ivp Dye [iodinated Diagnostic Agents] Itching   itching   Bactrim Itching, Nausea And Vomiting, Rash   And diarrhea   Sulfamethoxazole-trimethoprim Itching, Nausea And Vomiting, Rash   And diarrhea      Medication List    TAKE  these medications   acetaminophen 325 MG tablet Commonly known as:  TYLENOL Take 2 tablets (650 mg total) by mouth every 6 (six) hours as needed for mild pain (or Fever >/= 101).   atorvastatin 20 MG tablet Commonly known as:  LIPITOR Take 1 tablet (20 mg total) by mouth daily at 6 PM.   beta carotene w/minerals tablet Take 1 tablet by mouth daily.   cephALEXin 500 MG capsule Commonly known as:  KEFLEX Take 1 capsule (500 mg total) by mouth every 8 (eight) hours for 5 days.   cyanocobalamin 500 MCG tablet Take 1 tablet (500 mcg total) by mouth daily.   donepezil 5 MG tablet Commonly known as:  ARICEPT Take 5 mg by mouth at bedtime.   furosemide 40 MG tablet Commonly known as:  LASIX Take 1 tablet (40 mg total) by mouth daily.   guaiFENesin 600 MG 12 hr tablet Commonly known as:  MUCINEX Take 1 tablet (600 mg total) by mouth 2 (two) times daily.   insulin glargine 100 UNIT/ML injection Commonly known as:  LANTUS Inject 0.3 mLs (30 Units total) into the skin 2 (two) times daily.   levothyroxine 50 MCG tablet Commonly known as:  SYNTHROID, LEVOTHROID Take 50 mcg by mouth daily before breakfast.   predniSONE 20 MG tablet Commonly known as:  DELTASONE Take 3 tablets once daily for 5 days, then take 2 tablets once daily for 5 days, then take 1 tablet once daily for 5 days, then STOP.   traMADol 50 MG tablet Commonly known as:  ULTRAM Take 50 mg by mouth every 6 (six) hours as needed for moderate pain.   warfarin 4 MG tablet Commonly known as:  COUMADIN Take 1 tablet (4 mg total) by mouth daily at 6 PM. What changed:    how much to take  when to take this  additional instructions            Durable Medical Equipment  (From admission, onward)        Start     Ordered   10/24/17 0713  For home use only DME oxygen  Once    Question Answer Comment  Mode or (Route) Nasal cannula  Liters per Minute 2   Frequency Continuous (stationary and portable oxygen  unit needed)   Oxygen conserving device Yes   Oxygen delivery system Gas      10/24/17 0713       Follow-up Information    Coralie Common, MD. Schedule an appointment as soon as possible for a visit in 1 week(s).   Specialty:  General Practice Why:  discuss referral to pulmonology for interstitial lung disease. Contact information: [address] [place] [address] [phone]        Chrystie Nose, MD Follow up.   Specialty:  Cardiology Contact information: [address] [place] [address] [phone]        Jobe Gibbon, [facility] Follow up.   Specialty:  Home Health Services Why:  Physical Therapy Contact information: [address] [place] [address] [phone]        [facility] - Dme Follow up.   Why:  Oxygen Contact information: [address] [place] [address] [phone]           TOTAL DISCHARGE TIME: 35 minutes  Osvaldo Shipper  [facility] Pager [phone]  10/24/2017, 2:01 PM

## 2017-10-24 NOTE — Care Management Important Message (Signed)
Important Message  Patient Details  Name: Sara Dennis MRN: [id number] Date of Birth: 1922/01/14   Medicare Important Message Given:  Yes    Dorena Bodo 10/24/2017, 12:14 PM

## 2017-10-24 NOTE — Progress Notes (Signed)
Physical Therapy Treatment Patient Details Name: Sara Dennis MRN: [id number] DOB: April 10, 1922 Today's Date: 10/24/2017    History of Present Illness Sara Dennis is a 84 y.o. female with medical history significant of a.flutter, CHF, DM2, glaucoma.  Patient presents to the ED with 2 week history of non-productive cough.  Found to have acute on Chronic diastolic CHF.     PT Comments    Pt improving slowly.   Most of treatment time spent up on her feet, with rest at EOB in between.   Follow Up Recommendations  Home health PT;Supervision/Assistance - 24 hour     Equipment Recommendations  None recommended by PT    Recommendations for Other Services       Precautions / Restrictions Precautions Precautions: Fall;Other (comment)(blindness)    Mobility  Bed Mobility               General bed mobility comments: sitting EOB on arrival  Transfers Overall transfer level: Needs assistance   Transfers: Sit to/from Stand Sit to Stand: Min guard         General transfer comment: pt using good transfer safety without cues  Ambulation/Gait Ambulation/Gait assistance: Min assist Ambulation Distance (Feet): 15 Feet(x2) Assistive device: Rolling walker (2 wheeled) Gait Pattern/deviations: Step-through pattern   Gait velocity interpretation: Below normal speed for age/gender General Gait Details: short guarded steps, supportive assist and assist to maneuver the RW and keep the RW positioned close to the pt.   Stairs            Wheelchair Mobility    Modified Rankin (Stroke Patients Only)       Balance     Sitting balance-Leahy Scale: Fair Sitting balance - Comments: prefers UE assist due to blindness, but can sit safely without UE asist   Standing balance support: Bilateral upper extremity supported Standing balance-Leahy Scale: Poor Standing balance comment: reliant on AD or external support.                            Cognition  Arousal/Alertness: Awake/alert Behavior During Therapy: WFL for tasks assessed/performed Overall Cognitive Status: History of cognitive impairments - at baseline                                        Exercises      General Comments General comments (skin integrity, edema, etc.): sats maintained on RA today      Pertinent Vitals/Pain Pain Assessment: No/denies pain    Home Living                      Prior Function            PT Goals (current goals can now be found in the care plan section) Acute Rehab PT Goals Patient Stated Goal: home soon PT Goal Formulation: With patient Time For Goal Achievement: 10/28/17 Potential to Achieve Goals: Good Progress towards PT goals: Progressing toward goals    Frequency    Min 3X/week      PT Plan Current plan remains appropriate    Co-evaluation              AM-PAC PT "6 Clicks" Daily Activity  Outcome Measure  Difficulty turning over in bed (including adjusting bedclothes, sheets and blankets)?: A Little Difficulty moving from lying on back to sitting on  the side of the bed? : Unable Difficulty sitting down on and standing up from a chair with arms (e.g., wheelchair, bedside commode, etc,.)?: Unable Help needed moving to and from a bed to chair (including a wheelchair)?: A Little Help needed walking in hospital room?: A Little Help needed climbing 3-5 steps with a railing? : A Little 6 Click Score: 14    End of Session   Activity Tolerance: Patient tolerated treatment well Patient left: in chair;with call bell/phone within reach;with chair alarm set Nurse Communication: Mobility status PT Visit Diagnosis: Other abnormalities of gait and mobility (R26.89);Unsteadiness on feet (R26.81)     Time: 9024-0973 PT Time Calculation (min) (ACUTE ONLY): 21 min  Charges:  $Gait Training: 8-22 mins                    G Codes:       06-Nov-2017  Sara Dennis, PT [phone]   (pager)   Sara Dennis November 06, 2017, 5:30 PM

## 2017-10-24 NOTE — Progress Notes (Signed)
SATURATION QUALIFICATIONS: (This note is used to comply with regulatory documentation for home oxygen)  Patient Saturations on Room Air at Rest =86%  Patient Saturations on Room Air while Ambulating =n/a   Patient Saturations on 2 Liters of oxygen while resting = 93%  Please briefly explain why patient needs home oxygen: Desat occurred while pt was being changed in bed. Pt placed on 2 L of O2. Sats came back up to 93%.

## 2017-10-24 NOTE — Plan of Care (Signed)
  Health Behavior/Discharge Planning: Ability to manage health-related needs will improve 10/24/2017 0941 - Completed/Met by Shanon Rosser, RN   Clinical Measurements: Ability to maintain clinical measurements within normal limits will improve 10/24/2017 0941 - Completed/Met by Shanon Rosser, RN Will remain free from infection 10/24/2017 0941 - Completed/Met by Shanon Rosser, RN Diagnostic test results will improve 10/24/2017 0941 - Completed/Met by Shanon Rosser, RN Respiratory complications will improve 10/24/2017 0941 - Completed/Met by Shanon Rosser, RN Cardiovascular complication will be avoided 10/24/2017 0941 - Completed/Met by Shanon Rosser, RN   Coping: Level of anxiety will decrease 10/24/2017 0941 - Completed/Met by Shanon Rosser, RN   Elimination: Will not experience complications related to bowel motility 10/24/2017 0941 - Completed/Met by Shanon Rosser, RN Will not experience complications related to urinary retention 10/24/2017 0941 - Completed/Met by Shanon Rosser, RN   Pain Managment: General experience of comfort will improve 10/24/2017 0941 - Completed/Met by Shanon Rosser, RN   Safety: Ability to remain free from injury will improve 10/24/2017 0941 - Completed/Met by Shanon Rosser, RN   Skin Integrity: Risk for impaired skin integrity will decrease 10/24/2017 0941 - Completed/Met by Shanon Rosser, RN   Education: Ability to demonstrate management of disease process will improve 10/24/2017 0941 - Completed/Met by Shanon Rosser, RN Ability to verbalize understanding of medication therapies will improve 10/24/2017 0941 - Completed/Met by Shanon Rosser, RN   Activity: Capacity to carry out activities will improve 10/24/2017 0941 - Completed/Met by Shanon Rosser, RN   Cardiac: Ability to achieve and maintain adequate cardiopulmonary perfusion will improve 10/24/2017 0941 - Completed/Met by  Shanon Rosser, RN

## 2017-10-24 NOTE — Progress Notes (Signed)
ANTICOAGULATION CONSULT NOTE - Follow Up Consult  Pharmacy Consult for Coumadin Indication: atrial fibrillation  Patient Measurements: Height: 5\' 7"  (170.2 cm) Weight: 209 lb 3.5 oz (94.9 kg) IBW/kg (Calculated) : 61.6  Vital Signs: Temp: 97.6 F (36.4 C) (01/28 1500) Temp Source: Oral (01/28 1500) BP: 150/74 (01/28 1500) Pulse Rate: 75 (01/28 1500)  Labs: Recent Labs    10/22/17 0454 10/23/17 0415 10/24/17 0644  LABPROT 38.9* 29.7* 23.5*  INR 4.03* 2.85 2.12  CREATININE 1.35* 1.09* 0.88    Estimated Creatinine Clearance: 47.2 mL/min (by C-G formula based on SCr of 0.88 mg/dL).  Assessment:   84 yr old female continues on Coumadin for afib.   INR 2.12 today, therapeutic.   Coumadin was held for 2 days when INR >3, resumed with 2.5 mg x 1 on 1/27.   Discharge planned for today ~5:30pm;  Discussed with RN, who discussed with daugther. Coumadin to be given prior to discharge.    PTA Coumadin regimen: 6 mg daily  Goal of Therapy:  INR 2-3 Monitor platelets by anticoagulation protocol: Yes   Plan:   Coumadin 4 mg daily is planned at discharge.  Today's dose to be given prior to discharge.  Dennie Fetters, [place] Pager: [phone] 10/24/2017,4:30 PM

## 2017-10-26 DIAGNOSIS — J8410 Pulmonary fibrosis, unspecified: Secondary | ICD-10-CM

## 2017-10-26 HISTORY — DX: Pulmonary fibrosis, unspecified: J84.10

## 2017-11-03 ENCOUNTER — Other Ambulatory Visit: Payer: Self-pay

## 2017-11-03 ENCOUNTER — Emergency Department (HOSPITAL_COMMUNITY): Payer: Medicare HMO

## 2017-11-03 ENCOUNTER — Inpatient Hospital Stay (HOSPITAL_COMMUNITY)
Admission: EM | Admit: 2017-11-03 | Discharge: 2017-11-06 | DRG: 070 | Disposition: A | Discharge status: Home Health | Payer: Medicare HMO | Attending: Family Medicine | Admitting: Family Medicine

## 2017-11-03 ENCOUNTER — Encounter (HOSPITAL_COMMUNITY): Payer: Self-pay

## 2017-11-03 DIAGNOSIS — G9341 Metabolic encephalopathy: Principal | ICD-10-CM | POA: Diagnosis present

## 2017-11-03 DIAGNOSIS — I248 Other forms of acute ischemic heart disease: Secondary | ICD-10-CM | POA: Diagnosis present

## 2017-11-03 DIAGNOSIS — I482 Chronic atrial fibrillation: Secondary | ICD-10-CM | POA: Diagnosis present

## 2017-11-03 DIAGNOSIS — Z91041 Radiographic dye allergy status: Secondary | ICD-10-CM

## 2017-11-03 DIAGNOSIS — Z794 Long term (current) use of insulin: Secondary | ICD-10-CM | POA: Diagnosis not present

## 2017-11-03 DIAGNOSIS — J849 Interstitial pulmonary disease, unspecified: Secondary | ICD-10-CM | POA: Diagnosis present

## 2017-11-03 DIAGNOSIS — I272 Pulmonary hypertension, unspecified: Secondary | ICD-10-CM | POA: Diagnosis present

## 2017-11-03 DIAGNOSIS — R9431 Abnormal electrocardiogram [ECG] [EKG]: Secondary | ICD-10-CM | POA: Diagnosis not present

## 2017-11-03 DIAGNOSIS — H547 Unspecified visual loss: Secondary | ICD-10-CM | POA: Diagnosis present

## 2017-11-03 DIAGNOSIS — I481 Persistent atrial fibrillation: Secondary | ICD-10-CM | POA: Diagnosis present

## 2017-11-03 DIAGNOSIS — F0390 Unspecified dementia without behavioral disturbance: Secondary | ICD-10-CM | POA: Diagnosis present

## 2017-11-03 DIAGNOSIS — I4510 Unspecified right bundle-branch block: Secondary | ICD-10-CM | POA: Diagnosis present

## 2017-11-03 DIAGNOSIS — Z66 Do not resuscitate: Secondary | ICD-10-CM | POA: Diagnosis present

## 2017-11-03 DIAGNOSIS — E118 Type 2 diabetes mellitus with unspecified complications: Secondary | ICD-10-CM | POA: Diagnosis not present

## 2017-11-03 DIAGNOSIS — I5082 Biventricular heart failure: Secondary | ICD-10-CM | POA: Diagnosis not present

## 2017-11-03 DIAGNOSIS — E119 Type 2 diabetes mellitus without complications: Secondary | ICD-10-CM

## 2017-11-03 DIAGNOSIS — R402413 Glasgow coma scale score 13-15, at hospital admission: Secondary | ICD-10-CM | POA: Diagnosis present

## 2017-11-03 DIAGNOSIS — M1990 Unspecified osteoarthritis, unspecified site: Secondary | ICD-10-CM | POA: Diagnosis present

## 2017-11-03 DIAGNOSIS — R410 Disorientation, unspecified: Secondary | ICD-10-CM | POA: Diagnosis not present

## 2017-11-03 DIAGNOSIS — I130 Hypertensive heart and chronic kidney disease with heart failure and stage 1 through stage 4 chronic kidney disease, or unspecified chronic kidney disease: Secondary | ICD-10-CM | POA: Diagnosis present

## 2017-11-03 DIAGNOSIS — I509 Heart failure, unspecified: Secondary | ICD-10-CM

## 2017-11-03 DIAGNOSIS — Z881 Allergy status to other antibiotic agents status: Secondary | ICD-10-CM

## 2017-11-03 DIAGNOSIS — I5032 Chronic diastolic (congestive) heart failure: Secondary | ICD-10-CM | POA: Diagnosis present

## 2017-11-03 DIAGNOSIS — I4892 Unspecified atrial flutter: Secondary | ICD-10-CM | POA: Diagnosis present

## 2017-11-03 DIAGNOSIS — B961 Klebsiella pneumoniae [K. pneumoniae] as the cause of diseases classified elsewhere: Secondary | ICD-10-CM | POA: Diagnosis present

## 2017-11-03 DIAGNOSIS — Z888 Allergy status to other drugs, medicaments and biological substances status: Secondary | ICD-10-CM

## 2017-11-03 DIAGNOSIS — N390 Urinary tract infection, site not specified: Secondary | ICD-10-CM | POA: Diagnosis present

## 2017-11-03 DIAGNOSIS — R4182 Altered mental status, unspecified: Secondary | ICD-10-CM | POA: Diagnosis present

## 2017-11-03 DIAGNOSIS — I5033 Acute on chronic diastolic (congestive) heart failure: Secondary | ICD-10-CM | POA: Diagnosis not present

## 2017-11-03 DIAGNOSIS — N183 Chronic kidney disease, stage 3 (moderate): Secondary | ICD-10-CM | POA: Diagnosis present

## 2017-11-03 DIAGNOSIS — J449 Chronic obstructive pulmonary disease, unspecified: Secondary | ICD-10-CM | POA: Diagnosis present

## 2017-11-03 DIAGNOSIS — E1122 Type 2 diabetes mellitus with diabetic chronic kidney disease: Secondary | ICD-10-CM | POA: Diagnosis present

## 2017-11-03 DIAGNOSIS — I440 Atrioventricular block, first degree: Secondary | ICD-10-CM | POA: Diagnosis present

## 2017-11-03 DIAGNOSIS — Z7901 Long term (current) use of anticoagulants: Secondary | ICD-10-CM

## 2017-11-03 DIAGNOSIS — Z79899 Other long term (current) drug therapy: Secondary | ICD-10-CM

## 2017-11-03 DIAGNOSIS — I501 Left ventricular failure: Secondary | ICD-10-CM | POA: Diagnosis present

## 2017-11-03 DIAGNOSIS — Z882 Allergy status to sulfonamides status: Secondary | ICD-10-CM

## 2017-11-03 DIAGNOSIS — H409 Unspecified glaucoma: Secondary | ICD-10-CM | POA: Diagnosis present

## 2017-11-03 DIAGNOSIS — Z9981 Dependence on supplemental oxygen: Secondary | ICD-10-CM

## 2017-11-03 DIAGNOSIS — Z91048 Other nonmedicinal substance allergy status: Secondary | ICD-10-CM

## 2017-11-03 DIAGNOSIS — Z86711 Personal history of pulmonary embolism: Secondary | ICD-10-CM

## 2017-11-03 DIAGNOSIS — J9621 Acute and chronic respiratory failure with hypoxia: Secondary | ICD-10-CM | POA: Diagnosis present

## 2017-11-03 DIAGNOSIS — J8410 Pulmonary fibrosis, unspecified: Secondary | ICD-10-CM | POA: Diagnosis present

## 2017-11-03 DIAGNOSIS — E039 Hypothyroidism, unspecified: Secondary | ICD-10-CM | POA: Diagnosis present

## 2017-11-03 DIAGNOSIS — N189 Chronic kidney disease, unspecified: Secondary | ICD-10-CM | POA: Diagnosis present

## 2017-11-03 DIAGNOSIS — R001 Bradycardia, unspecified: Secondary | ICD-10-CM | POA: Diagnosis present

## 2017-11-03 DIAGNOSIS — R0602 Shortness of breath: Secondary | ICD-10-CM

## 2017-11-03 HISTORY — DX: Pulmonary fibrosis, unspecified: J84.10

## 2017-11-03 HISTORY — DX: Chronic obstructive pulmonary disease, unspecified: J44.9

## 2017-11-03 LAB — URINALYSIS, ROUTINE W REFLEX MICROSCOPIC
Bilirubin Urine: NEGATIVE
GLUCOSE, UA: NEGATIVE mg/dL
Ketones, ur: NEGATIVE mg/dL
NITRITE: NEGATIVE
PH: 5 (ref 5.0–8.0)
PROTEIN: 30 mg/dL — AB
SQUAMOUS EPITHELIAL / LPF: NONE SEEN
Specific Gravity, Urine: 1.01 (ref 1.005–1.030)

## 2017-11-03 LAB — BRAIN NATRIURETIC PEPTIDE: B NATRIURETIC PEPTIDE 5: 233 pg/mL — AB (ref 0.0–100.0)

## 2017-11-03 LAB — I-STAT VENOUS BLOOD GAS, ED
ACID-BASE EXCESS: 7 mmol/L — AB (ref 0.0–2.0)
Bicarbonate: 33.7 mmol/L — ABNORMAL HIGH (ref 20.0–28.0)
O2 SAT: 76 %
PO2 VEN: 42 mmHg (ref 32.0–45.0)
TCO2: 35 mmol/L — AB (ref 22–32)
pCO2, Ven: 53.9 mmHg (ref 44.0–60.0)
pH, Ven: 7.404 (ref 7.250–7.430)

## 2017-11-03 LAB — CBC WITH DIFFERENTIAL/PLATELET
BASOS ABS: 0 10*3/uL (ref 0.0–0.1)
Basophils Relative: 0 %
Eosinophils Absolute: 0.1 10*3/uL (ref 0.0–0.7)
Eosinophils Relative: 0 %
HCT: 49.1 % — ABNORMAL HIGH (ref 36.0–46.0)
HEMOGLOBIN: 16.1 g/dL — AB (ref 12.0–15.0)
LYMPHS ABS: 0.7 10*3/uL (ref 0.7–4.0)
LYMPHS PCT: 4 %
MCH: 25.2 pg — ABNORMAL LOW (ref 26.0–34.0)
MCHC: 32.8 g/dL (ref 30.0–36.0)
MCV: 76.7 fL — AB (ref 78.0–100.0)
Monocytes Absolute: 0.4 10*3/uL (ref 0.1–1.0)
Monocytes Relative: 2 %
NEUTROS PCT: 94 %
Neutro Abs: 14.1 10*3/uL — ABNORMAL HIGH (ref 1.7–7.7)
Platelets: 193 10*3/uL (ref 150–400)
RBC: 6.4 MIL/uL — AB (ref 3.87–5.11)
RDW: 17.5 % — ABNORMAL HIGH (ref 11.5–15.5)
WBC: 15.2 10*3/uL — AB (ref 4.0–10.5)

## 2017-11-03 LAB — COMPREHENSIVE METABOLIC PANEL
ALT: 37 U/L (ref 14–54)
ANION GAP: 12 (ref 5–15)
AST: 37 U/L (ref 15–41)
Albumin: 2.7 g/dL — ABNORMAL LOW (ref 3.5–5.0)
Alkaline Phosphatase: 50 U/L (ref 38–126)
BUN: 19 mg/dL (ref 6–20)
CHLORIDE: 99 mmol/L — AB (ref 101–111)
CO2: 27 mmol/L (ref 22–32)
Calcium: 8.6 mg/dL — ABNORMAL LOW (ref 8.9–10.3)
Creatinine, Ser: 1.09 mg/dL — ABNORMAL HIGH (ref 0.44–1.00)
GFR, EST AFRICAN AMERICAN: 49 mL/min — AB (ref >60)
GFR, EST NON AFRICAN AMERICAN: 42 mL/min — AB (ref >60)
Glucose, Bld: 246 mg/dL — ABNORMAL HIGH (ref 65–99)
POTASSIUM: 4.5 mmol/L (ref 3.5–5.1)
Sodium: 138 mmol/L (ref 135–145)
TOTAL PROTEIN: 6.2 g/dL — AB (ref 6.5–8.1)
Total Bilirubin: 0.9 mg/dL (ref 0.3–1.2)

## 2017-11-03 LAB — AMMONIA: Ammonia: 22 umol/L (ref 9–35)

## 2017-11-03 LAB — I-STAT CHEM 8, ED
BUN: 21 mg/dL — AB (ref 6–20)
CREATININE: 0.9 mg/dL (ref 0.44–1.00)
Calcium, Ion: 1.09 mmol/L — ABNORMAL LOW (ref 1.15–1.40)
Chloride: 99 mmol/L — ABNORMAL LOW (ref 101–111)
Glucose, Bld: 224 mg/dL — ABNORMAL HIGH (ref 65–99)
HEMATOCRIT: 52 % — AB (ref 36.0–46.0)
HEMOGLOBIN: 17.7 g/dL — AB (ref 12.0–15.0)
POTASSIUM: 4.5 mmol/L (ref 3.5–5.1)
SODIUM: 139 mmol/L (ref 135–145)
TCO2: 29 mmol/L (ref 22–32)

## 2017-11-03 LAB — I-STAT CG4 LACTIC ACID, ED: LACTIC ACID, VENOUS: 2.94 mmol/L — AB (ref 0.5–1.9)

## 2017-11-03 LAB — TROPONIN I: Troponin I: 0.07 ng/mL

## 2017-11-03 LAB — I-STAT TROPONIN, ED: Troponin i, poc: 0.09 ng/mL (ref 0.00–0.08)

## 2017-11-03 LAB — GLUCOSE, CAPILLARY: GLUCOSE-CAPILLARY: 399 mg/dL — AB (ref 65–99)

## 2017-11-03 MED ORDER — INSULIN ASPART 100 UNIT/ML ~~LOC~~ SOLN
0.0000 [IU] | Freq: Three times a day (TID) | SUBCUTANEOUS | Status: DC
  Administered 2017-11-04: 3 [IU] via SUBCUTANEOUS
  Administered 2017-11-04: 4 [IU] via SUBCUTANEOUS
  Administered 2017-11-04: 20 [IU] via SUBCUTANEOUS
  Administered 2017-11-05: 4 [IU] via SUBCUTANEOUS
  Administered 2017-11-05: 11 [IU] via SUBCUTANEOUS

## 2017-11-03 MED ORDER — WARFARIN SODIUM 4 MG PO TABS: 4.0000 mg | ORAL_TABLET | Freq: Every day | ORAL | Status: DC

## 2017-11-03 MED ORDER — SODIUM CHLORIDE 0.9% FLUSH
3.0000 mL | Freq: Two times a day (BID) | INTRAVENOUS | Status: DC
  Administered 2017-11-04 – 2017-11-06 (×3): 3 mL via INTRAVENOUS

## 2017-11-03 MED ORDER — DONEPEZIL HCL 5 MG PO TABS
5.0000 mg | ORAL_TABLET | Freq: Every day | ORAL | Status: DC
  Administered 2017-11-04 – 2017-11-06 (×3): 5 mg via ORAL
  Filled 2017-11-03 (×3): qty 1

## 2017-11-03 MED ORDER — PREDNISONE 20 MG PO TABS
40.0000 mg | ORAL_TABLET | Freq: Every day | ORAL | Status: DC
  Administered 2017-11-04 – 2017-11-06 (×3): 40 mg via ORAL
  Filled 2017-11-03 (×3): qty 2

## 2017-11-03 MED ORDER — WARFARIN SODIUM 4 MG PO TABS: 4.0000 mg | ORAL_TABLET | Freq: Once | ORAL | Status: DC

## 2017-11-03 MED ORDER — DEXTROSE 5 % IV SOLN
1.0000 g | INTRAVENOUS | Status: DC
  Administered 2017-11-03 – 2017-11-04 (×2): 1 g via INTRAVENOUS
  Filled 2017-11-03 (×3): qty 1

## 2017-11-03 MED ORDER — FUROSEMIDE 10 MG/ML IJ SOLN
40.0000 mg | Freq: Every day | INTRAMUSCULAR | Status: DC
Start: 2017-11-03 — End: 2017-11-04

## 2017-11-03 MED ORDER — LEVOTHYROXINE SODIUM 50 MCG PO TABS
50.0000 ug | ORAL_TABLET | Freq: Every day | ORAL | Status: DC
  Administered 2017-11-04 – 2017-11-06 (×3): 50 ug via ORAL
  Filled 2017-11-03 (×4): qty 1

## 2017-11-03 MED ORDER — SODIUM CHLORIDE 0.9% FLUSH: 3.0000 mL | INTRAVENOUS | Status: DC | PRN

## 2017-11-03 MED ORDER — ACETAMINOPHEN 325 MG PO TABS
650.0000 mg | ORAL_TABLET | Freq: Four times a day (QID) | ORAL | Status: DC | PRN
  Administered 2017-11-04 – 2017-11-05 (×3): 650 mg via ORAL
  Filled 2017-11-03 (×3): qty 2

## 2017-11-03 MED ORDER — INSULIN GLARGINE 100 UNIT/ML ~~LOC~~ SOLN
30.0000 [IU] | Freq: Two times a day (BID) | SUBCUTANEOUS | Status: DC
  Administered 2017-11-03 – 2017-11-06 (×6): 30 [IU] via SUBCUTANEOUS
  Filled 2017-11-03 (×8): qty 0.3

## 2017-11-03 MED ORDER — FUROSEMIDE 10 MG/ML IJ SOLN
40.0000 mg | INTRAMUSCULAR | Status: AC
  Administered 2017-11-03: 40 mg via INTRAVENOUS
  Filled 2017-11-03: qty 4

## 2017-11-03 MED ORDER — SODIUM CHLORIDE 0.9 % IV SOLN: 250.0000 mL | INTRAVENOUS | Status: DC | PRN

## 2017-11-03 MED ORDER — WARFARIN - PHARMACIST DOSING INPATIENT: Freq: Every day | Status: DC

## 2017-11-03 MED ORDER — DEXTROSE 5 % IV SOLN
1.0000 g | Freq: Once | INTRAVENOUS | Status: AC
  Administered 2017-11-03: 1 g via INTRAVENOUS
  Filled 2017-11-03: qty 10

## 2017-11-03 MED ORDER — ATORVASTATIN CALCIUM 10 MG PO TABS
20.0000 mg | ORAL_TABLET | Freq: Every day | ORAL | Status: DC
  Administered 2017-11-04 – 2017-11-05 (×2): 20 mg via ORAL
  Filled 2017-11-03 (×2): qty 2

## 2017-11-03 MED ORDER — PREDNISONE 20 MG PO TABS: 20.0000 mg | ORAL_TABLET | Freq: Every day | ORAL | Status: DC

## 2017-11-03 NOTE — ED Triage Notes (Signed)
Pt from home with Home Health RN has noticed an increase in AMS, lethargy and intermittent confusion.  EMS was called yesterday but family did not want to transport at that time.  Today pt showed no improvement so EMS brought her for evaluation.  VSS.  RBBB  Noted.  Pt is blind and has chronic lung disease on home o2 at 2Lpm and satting well.

## 2017-11-03 NOTE — Progress Notes (Signed)
Troponin level of 0.07. Dr. Bruna Potter made aware.

## 2017-11-03 NOTE — Progress Notes (Signed)
ANTICOAGULATION CONSULT NOTE - Initial Consult  Pharmacy Consult for warfarin Indication: atrial fibrillation  Allergies  Allergen Reactions  . Brilliant Blue Fcf Hives  . Ciprofloxacin Itching  . Gemfibrozil Nausea And Vomiting and Other (See Comments)    Sick and give urine infection  . Iohexol Itching  . Ivp Dye [Iodinated Diagnostic Agents] Itching    itching  . Metrizamide Itching    itching  . Bactrim Itching, Nausea And Vomiting and Rash    And diarrhea  . Sulfamethoxazole-Trimethoprim Itching, Nausea And Vomiting and Rash    And diarrhea    Patient Measurements: Height: 5\' 5"  (165.1 cm) Weight: 209 lb (94.8 kg) IBW/kg (Calculated) : 57  Vital Signs: Temp: 98.2 F (36.8 C) (02/07 1130) Temp Source: Oral (02/07 1130) BP: 127/50 (02/07 1715) Pulse Rate: 59 (02/07 1715)  Labs: Recent Labs    11/03/17 1234 11/03/17 1455  HGB 16.1* 17.7*  HCT 49.1* 52.0*  PLT 193  --   CREATININE 1.09* 0.90    Estimated Creatinine Clearance: 44.5 mL/min (by C-G formula based on SCr of 0.9 mg/dL).   Medical History: Past Medical History:  Diagnosis Date  . Arthritis    left leg  . Bradycardia    a. 05/2015: HR 40s-50s, occ 30s at night - not clear how much symptoms were r/t this, patient declined PPM at that time.  . Chronic atrial flutter (HCC)   . Chronic diastolic CHF (congestive heart failure) (HCC)   . CKD (chronic kidney disease), stage III (HCC)   . COPD (chronic obstructive pulmonary disease) (HCC)   . Diabetes mellitus   . Dyspnea   . Glaucoma    a. with significantly decreased vision.  . Hypothyroidism   . Mild dementia   . Moderate to severe pulmonary hypertension (HCC) By echo 05/2015  . Permanent atrial fibrillation (HCC)   . Pulmonary embolus (HCC) 03-23-11   a. 1978 and 2012.  . Pulmonary fibrosis, unspecified (HCC) 10/26/2017  . RBBB     Medications:  Scheduled:  . [START ON 11/04/2017] atorvastatin  20 mg Oral q1800  . [START ON 11/04/2017]  donepezil  5 mg Oral Daily  . furosemide  40 mg Intravenous Daily  . [START ON 11/04/2017] insulin aspart  0-20 Units Subcutaneous TID WC  . insulin glargine  30 Units Subcutaneous BID  . [START ON 11/04/2017] levothyroxine  50 mcg Oral QAC breakfast  . [START ON 11/04/2017] predniSONE  40 mg Oral Q breakfast  . sodium chloride flush  3 mL Intravenous Q12H  . [START ON 11/04/2017] warfarin  4 mg Oral ONCE-1800  . [START ON 11/04/2017] Warfarin - Pharmacist Dosing Inpatient   Does not apply q1800    Assessment: 84 yo female recently discharged after being treated for UTI. Returned with worsening symptoms. Currently being treated with cefepime.   Patient taking warfarin PTA for afib.  Patient home dose of warfarin is 4 mg daily.   Goal of Therapy:  INR 2-3 Monitor platelets by anticoagulation protocol: Yes   Plan:  Warfarin PO 4 mg x 1 tonight Daily INR Monitor signs of bleeding   Sara Dennis 11/03/2017,7:23 PM

## 2017-11-03 NOTE — H&P (Signed)
History and Physical:    Sara Dennis   JXB:[id number] DOB: 02/19/1922 DOA: 11/03/2017  Referring MD/provider: Dr. Clarene Duke PCP: Coralie Common, MD   Patient coming from: Home  Chief Complaint: "She's not herself".  History of Present Illness:   Sara Dennis is an 84 y.o. female who was brought in by family for seeming restless, uncomfortable and short of breath. History is per patient's granddaughter who works at [employer] and her great granddaughter.  Patient has past medical history significant for dementia, atrial fibrillation, diastolic heart failure, interstitial lung disease, pulmonary hypertension, diabetes, hypothyroidism, CKD who was recently discharged 2 weeks ago after treatment for pulmonary edema/diastolic heart failure with Lasix, UTI treated with Rocephin and interstitial lung disease treated with initiation of prednisone taper. Granddaughter notes  that at baseline patient who is blind is able to walk slowly with walker to the bathroom however mostly stays in bed. Since discharge however she has had a harder time getting out of bed and now is requiring oxygen 2 L continuously whereas previously she had not been on oxygen.  Family notes that last night patient became quite restless, was unable to lie flat and seemed more somnolent than usual. They also noted that she had a dry diaper after 12 hours which is unusual for her. Patient herself states that she is uncomfortable, says she has some abdominal pain but is unable to specify where. She denies nausea or vomiting.  ED Course:  The patient was thought to have a UTI and started on Rocephin.  ROS:   ROS   Review of Systems: Patient is unable to provide a ROS  Past Medical History:   Past Medical History:  Diagnosis Date  . Arthritis    left leg  . Bradycardia    a. 05/2015: HR 40s-50s, occ 30s at night - not clear how much symptoms were r/t this, patient declined PPM at that time.  . Chronic atrial flutter (HCC)     . Chronic diastolic CHF (congestive heart failure) (HCC)   . CKD (chronic kidney disease), stage III (HCC)   . COPD (chronic obstructive pulmonary disease) (HCC)   . Diabetes mellitus   . Dyspnea   . Glaucoma    a. with significantly decreased vision.  . Hypothyroidism   . Mild dementia   . Moderate to severe pulmonary hypertension (HCC) By echo 05/2015  . Permanent atrial fibrillation (HCC)   . Pulmonary embolus (HCC) 03-23-11   a. 1978 and 2012.  . Pulmonary fibrosis, unspecified (HCC) 10/26/2017  . RBBB     Past Surgical History:   Past Surgical History:  Procedure Laterality Date  . CYSTOSCOPY W/ URETERAL STENT PLACEMENT  09/06/2011   Procedure: CYSTOSCOPY WITH RETROGRADE PYELOGRAM/URETERAL STENT PLACEMENT;  Surgeon: Valetta Fuller, MD;  Location: [facility];  Service: Urology;  Laterality: Right;  cysto double j stent placement  . EYE SURGERY    . kisney stone removal  1978   removed thru bladder  . URETEROSCOPY  09/06/2011   Procedure: URETEROSCOPY;  Surgeon: Valetta Fuller, MD;  Location: [facility];  Service: Urology;  Laterality: Right;    Social History:   Social History   Socioeconomic History  . Marital status: Widowed    Spouse name: Not on file  . Number of children: Not on file  . Years of education: Not on file  . Highest education level: Not on file  Social Needs  . Financial resource strain: Not on file  .  Food insecurity - worry: Not on file  . Food insecurity - inability: Not on file  . Transportation needs - medical: Not on file  . Transportation needs - non-medical: Not on file  Occupational History  . Occupation: [employer]  Tobacco Use  . Smoking status: Never Smoker  . Smokeless tobacco: Never Used  Substance and Sexual Activity  . Alcohol use: No  . Drug use: No  . Sexual activity: No  Other Topics Concern  . Not on file  Social History Narrative   Pt lives with her daugher    Allergies   Brilliant blue fcf; Ciprofloxacin; Gemfibrozil;  Iohexol; Ivp dye [iodinated diagnostic agents]; Metrizamide; Bactrim; and Sulfamethoxazole-trimethoprim  Family history:   Family History  Problem Relation Age of Onset  . Hypertension Father     Current Medications:   Prior to Admission medications   Medication Sig Start Date End Date Taking? Authorizing Provider  acetaminophen (TYLENOL) 325 MG tablet Take 2 tablets (650 mg total) by mouth every 6 (six) hours as needed for mild pain (or Fever >/= 101). 09/10/15  Yes Marlin Canary U, DO  atorvastatin (LIPITOR) 20 MG tablet Take 1 tablet (20 mg total) by mouth daily at 6 PM. 08/11/15  Yes Hilty, Lisette Abu, MD  cyanocobalamin 500 MCG tablet Take 1 tablet (500 mcg total) by mouth daily. 02/08/16  Yes Tat, Onalee Hua, MD  donepezil (ARICEPT) 5 MG tablet Take 5 mg by mouth daily.    Yes [provider]  furosemide (LASIX) 40 MG tablet Take 1 tablet (40 mg total) by mouth daily. 03/17/16  Yes Marinda Elk, MD  insulin glargine (LANTUS) 100 UNIT/ML injection Inject 0.3 mLs (30 Units total) into the skin 2 (two) times daily. 09/10/15  Yes Vann, Selinda Orion, DO  levothyroxine (SYNTHROID, LEVOTHROID) 50 MCG tablet Take 50 mcg by mouth daily before breakfast.   Yes [provider]  predniSONE (DELTASONE) 20 MG tablet Take 3 tablets once daily for 5 days, then take 2 tablets once daily for 5 days, then take 1 tablet once daily for 5 days, then STOP. 10/24/17  Yes Osvaldo Shipper, MD  warfarin (COUMADIN) 4 MG tablet Take 1 tablet (4 mg total) by mouth daily at 6 PM. 10/24/17  Yes Osvaldo Shipper, MD  guaiFENesin (MUCINEX) 600 MG 12 hr tablet Take 1 tablet (600 mg total) by mouth 2 (two) times daily. Patient not taking: Reported on 11/03/2017 10/24/17   Osvaldo Shipper, MD    Physical Exam:   Vitals:   11/03/17 1630 11/03/17 1645 11/03/17 1700 11/03/17 1715  BP: (!) 119/101 (!) 149/83 (!) 114/49 (!) 127/50  Pulse: (!) 54 (!) 59 (!) 54 (!) 59  Resp: (!) 23 (!) 31 (!) 26 (!) 29  Temp:       TempSrc:      SpO2: 95% 95% 94% 95%  Weight:      Height:         Physical Exam: Blood pressure (!) 127/50, pulse (!) 59, temperature 98.2 F (36.8 C), temperature source Oral, resp. rate (!) 29, height 5\' 5"  (1.651 m), weight 94.8 kg (209 lb), SpO2 95 %. Gen: Obese somnolent patient lying flat in bed in no respiratory distress with normal respirations Eyes: Sclerae anicteric.  Chest: Coarse crackles heard half way up bilaterally but otherwise reasonably good air entry bilaterally  CV: Distant, irregular, no audible murmurs. Abdomen: Obese, soft, NABS, I am unable to elicit any tenderness to light or deep palpation in any of the  quadrants. No rebound, no guarding. Extremities: No edema.  Skin: Warm and dry. No rashes, lesions or wounds. Neuro: Sleepy but arousable by voice and touch. She is cooperative and answers yes no questions appropriately when awake Psych: Patient is cooperative.  Data Review:    Labs: Basic Metabolic Panel: Recent Labs  Lab 11/03/17 1234 11/03/17 1455  NA 138 139  K 4.5 4.5  CL 99* 99*  CO2 27  --   GLUCOSE 246* 224*  BUN 19 21*  CREATININE 1.09* 0.90  CALCIUM 8.6*  --    Liver Function Tests: Recent Labs  Lab 11/03/17 1234  AST 37  ALT 37  ALKPHOS 50  BILITOT 0.9  PROT 6.2*  ALBUMIN 2.7*   No results for input(s): LIPASE, AMYLASE in the last 168 hours. Recent Labs  Lab 11/03/17 1235  AMMONIA 22   CBC: Recent Labs  Lab 11/03/17 1234 11/03/17 1455  WBC 15.2*  --   NEUTROABS 14.1*  --   HGB 16.1* 17.7*  HCT 49.1* 52.0*  MCV 76.7*  --   PLT 193  --    Cardiac Enzymes: No results for input(s): CKTOTAL, CKMB, CKMBINDEX, TROPONINI in the last 168 hours.  BNP (last 3 results) No results for input(s): PROBNP in the last 8760 hours. CBG: No results for input(s): GLUCAP in the last 168 hours.  Urinalysis    Component Value Date/Time   COLORURINE YELLOW 11/03/2017 1451   APPEARANCEUR CLOUDY (A) 11/03/2017 1451   LABSPEC  1.010 11/03/2017 1451   PHURINE 5.0 11/03/2017 1451   GLUCOSEU NEGATIVE 11/03/2017 1451   HGBUR MODERATE (A) 11/03/2017 1451   BILIRUBINUR NEGATIVE 11/03/2017 1451   KETONESUR NEGATIVE 11/03/2017 1451   PROTEINUR 30 (A) 11/03/2017 1451   UROBILINOGEN 0.2 06/13/2015 1105   NITRITE NEGATIVE 11/03/2017 1451   LEUKOCYTESUR MODERATE (A) 11/03/2017 1451      Radiographic Studies: Dg Chest 2 View  Result Date: 11/03/2017 CLINICAL DATA:  Weakness.  Shortness of breath. EXAM: CHEST  2 VIEW COMPARISON:  10/20/2017 FINDINGS: Progressive enlargement of the cardiac silhouette. Calcific atherosclerotic disease and tortuosity of the aorta. There is no evidence of lobar consolidation or pleural effusion. Chronic coarsening and increase of the interstitial markings. Osseous structures are without acute abnormality. Soft tissues are grossly normal. IMPRESSION: Progressive enlargement of the cardiac silhouette. Chronic coarsening of the interstitial markings may represent pulmonary fibrosis and/or interstitial pulmonary edema. Electronically Signed   By: Ted Mcalpine M.D.   On: 11/03/2017 13:13   Ct Head Wo Contrast  Result Date: 11/03/2017 CLINICAL DATA:  Altered mental status with increasing confusion EXAM: CT HEAD WITHOUT CONTRAST TECHNIQUE: Contiguous axial images were obtained from the base of the skull through the vertex without intravenous contrast. COMPARISON:  March 16, 2016 FINDINGS: Brain: Moderate diffuse atrophy is stable. There is no intracranial mass, hemorrhage, extra-axial fluid collection, or midline shift. There is small vessel disease throughout the centra semiovale bilaterally. There is small vessel disease in both anterior limbs of the external capsules. There is a prior small infarct in the left thalamus. There is small vessel disease elsewhere in each thalamus. Elsewhere the gray-white compartments appear normal. No acute infarct is evident. Vascular: There is no appreciable hyperdense  vessel. There is calcification in each carotid siphon and distal left vertebral artery regions. Skull: The bony calvarium appears intact. Sinuses/Orbits: Diffuse increased attenuation throughout the right globe is a stable finding. Orbits otherwise appear unremarkable bilaterally. There is mucosal thickening in several ethmoid air cells. Paranasal  sinuses elsewhere are clear. Other: Mastoid air cells are clear. IMPRESSION: 1. Stable atrophy with supratentorial small vessel disease. Prior small infarct left thalamus. No acute infarct evident. No hemorrhage or mass. 2.   Foci of arterial vascular calcification noted. 3. Chronic increased attenuation in the right globe. 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 4.   Mucosal thickening in several ethmoid air cells, stable. Electronically Signed   By: Bretta Bang III M.D.   On: 11/03/2017 13:27    EKG: Independently reviewed. Slow A. fib at 50. T-wave inversions at II, III, and F and V5 through V6.   Assessment/Plan:   Principal Problem:   Altered mental status Active Problems:   CHF (congestive heart failure) (HCC)   Chronic kidney disease stage III   Diabetes mellitus (HCC)   84 year old patient who was recently admitted after treatment for pulmonary edema, interstitial lung disease and UTI now presents with increased confusion, increased oxygen demand and an abnormal urine. She was noted to have decreased urine output by her family.  ALTERED MENTAL STATUS Is multifactorial, likely secondary to UTI and hypoxia. It also sounds like she was possibly intravascularly fluid depleted, possibly secondary to decreased by mouth intake. Follow mental status as we treat heart failure and UTI.  HYPOXIA  Multifactorial with patient's pulmonary hypertension, interstitial lung disease and possibly superimposed pulmonary edema Will increase patient's prednisone back to 40 mg by mouth daily Will change her oral Lasix 40 mg 2 Lasix 40 mg IV However  if no improvement with Lasix, would have low threshold for discontinuing.  ABNORMAL EKG Will check troponin now Patient is asymptomatic at present.  UTI Patient with Klebsiella pneumonia sensitive to ceftriaxone on last admission with which she was treated. Will change to cefepime today. Cultures pending.  INTERSTITIAL LUNG DISEASE  As noted above, will increase prednisone back to 40 mg. Patient had tapered down to 20 mg  PULMONARY HYPERTENSION Peak pressure 78 on recent echo  DM Continue home doses of glargine with sliding scale insulin coverage  ATRIAL FIBRILLATION Rate is controlled off all rate controlling agents, likely has some level of conductive system disease Patient is anticoagulated on warfarin will continue  HYPOTHYROIDISM Continue Synthroid    Other information:   DVT prophylaxis: On warfarin Code Status: DO NOT RESUSCITATE Family Communication: Concern family is well aware the patient is here Disposition Plan: Home Consults called: None Admission status: Inpatient   The medical decision making on this patient was of high complexity and the patient is at high risk for clinical deterioration, therefore this is a level 3 visit.   Horatio Pel Orma [facility] Pager [phone] Cell: [phone]   If 7PM-7AM, please contact night-coverage [email or web address] Password [facility] 11/03/2017, 6:25 PM

## 2017-11-03 NOTE — ED Provider Notes (Signed)
[facility] EMERGENCY DEPARTMENT Provider Note   CSN: [id number] Arrival date & time: 11/03/17  1149     History   Chief Complaint Chief Complaint  Patient presents with  . Altered Mental Status    HPI Sara Dennis is a 84 y.o. female.  84 year old female with past medical history including atrial fibrillation, dementia, CHF, CKD, pulmonary hypertension on 2L O2 at home who presents with altered mental status.  Patient was admitted to the hospital recently for CHF and acute respiratory failure.  She was discharged home on antibiotics which she finished 2 days ago and she is still on a steroid taper.  Caregiver called EMS this morning for altered mental status, confusion, and lethargy since yesterday.  She states that she was up all night, restless and said that she was too short of breath when lying flat. She has had decreased appetite and stated that she does not feel well. She has had increased urinary frequency that has been worsening this week, getting up often to urinate. She has not had any vomiting, diarrhea, or significant cough.  No recent changes to her medications.  CBG was 202.  Right bundle branch block noted on EKG.  She is blind.  LEVEL 5 CAVEAT DUE TO AMS AND DEMENTIA   The history is provided by the EMS personnel and a caregiver.    Past Medical History:  Diagnosis Date  . Arthritis    left leg  . Bradycardia    a. 05/2015: HR 40s-50s, occ 30s at night - not clear how much symptoms were r/t this, patient declined PPM at that time.  . Chronic atrial flutter (HCC)   . Chronic diastolic CHF (congestive heart failure) (HCC)   . CKD (chronic kidney disease), stage III (HCC)   . COPD (chronic obstructive pulmonary disease) (HCC)   . Diabetes mellitus   . Dyspnea   . Glaucoma    a. with significantly decreased vision.  . Hypothyroidism   . Mild dementia   . Moderate to severe pulmonary hypertension (HCC) By echo 05/2015  . Permanent atrial  fibrillation (HCC)   . Pulmonary embolus (HCC) 03-23-11   a. 1978 and 2012.  . Pulmonary fibrosis, unspecified (HCC) 10/26/2017  . RBBB     Patient Active Problem List   Diagnosis Date Noted  . Acute CHF (congestive heart failure) (HCC) 10/20/2017  . Acute on chronic diastolic CHF (congestive heart failure) (HCC) 10/19/2017  . Acute respiratory failure with hypoxia (HCC) 10/19/2017  . Delirium 03/16/2016  . Delirium due to another medical condition, acute, mixed level of activity 03/16/2016    Class: Acute  . CKD stage 3 due to type 2 diabetes mellitus (HCC) 02/05/2016  . Atrial fibrillation (HCC) 02/05/2016  . Gout 09/10/2015  . UTI (lower urinary tract infection) 09/06/2015  . Chronic diastolic (congestive) heart failure (HCC) 09/06/2015  . Diabetes mellitus 09/06/2015  . Acute on chronic renal failure (HCC) 09/06/2015  . Warfarin-induced coagulopathy (HCC) 09/06/2015  . Hypothyroidism 09/06/2015  . Fractured medial malleolus   . Demand ischemia (HCC) 06/12/2015  . Pulmonary edema with congestive heart failure (HCC)   . Atrial flutter, unspecified   . Bradycardia 06/11/2015  . Anginal pain (HCC) 06/11/2015  . History of pulmonary embolism 06/11/2015  . Atrial flutter (HCC) 05/06/2013  . Diabetes mellitus (HCC) 05/05/2013  . Hypotension 05/04/2013  . UTI (urinary tract infection) 01/30/2013  . CAP (community acquired pneumonia) 01/30/2013  . Leukocytosis 01/30/2013  . Acute encephalopathy 01/30/2013  .  CHF (congestive heart failure) (HCC) 01/30/2013  . Chronic kidney disease stage III 01/30/2013  . Ureteral calculus 09/06/2011    Past Surgical History:  Procedure Laterality Date  . CYSTOSCOPY W/ URETERAL STENT PLACEMENT  09/06/2011   Procedure: CYSTOSCOPY WITH RETROGRADE PYELOGRAM/URETERAL STENT PLACEMENT;  Surgeon: Valetta Fuller, MD;  Location: [facility];  Service: Urology;  Laterality: Right;  cysto double j stent placement  . EYE SURGERY    . kisney stone removal   1978   removed thru bladder  . URETEROSCOPY  09/06/2011   Procedure: URETEROSCOPY;  Surgeon: Valetta Fuller, MD;  Location: [facility];  Service: Urology;  Laterality: Right;    OB History    No data available       Home Medications    Prior to Admission medications   Medication Sig Start Date End Date Taking? Authorizing Provider  acetaminophen (TYLENOL) 325 MG tablet Take 2 tablets (650 mg total) by mouth every 6 (six) hours as needed for mild pain (or Fever >/= 101). 09/10/15   Joseph Art, DO  atorvastatin (LIPITOR) 20 MG tablet Take 1 tablet (20 mg total) by mouth daily at 6 PM. 08/11/15   Hilty, Lisette Abu, MD  beta carotene w/minerals (OCUVITE) tablet Take 1 tablet by mouth daily.    [provider]  cyanocobalamin 500 MCG tablet Take 1 tablet (500 mcg total) by mouth daily. 02/08/16   Catarina Hartshorn, MD  donepezil (ARICEPT) 5 MG tablet Take 5 mg by mouth at bedtime.     [provider]  furosemide (LASIX) 40 MG tablet Take 1 tablet (40 mg total) by mouth daily. 03/17/16   Marinda Elk, MD  guaiFENesin (MUCINEX) 600 MG 12 hr tablet Take 1 tablet (600 mg total) by mouth 2 (two) times daily. 10/24/17   Osvaldo Shipper, MD  insulin glargine (LANTUS) 100 UNIT/ML injection Inject 0.3 mLs (30 Units total) into the skin 2 (two) times daily. 09/10/15   Joseph Art, DO  levothyroxine (SYNTHROID, LEVOTHROID) 50 MCG tablet Take 50 mcg by mouth daily before breakfast.    [provider]  predniSONE (DELTASONE) 20 MG tablet Take 3 tablets once daily for 5 days, then take 2 tablets once daily for 5 days, then take 1 tablet once daily for 5 days, then STOP. 10/24/17   Osvaldo Shipper, MD  traMADol (ULTRAM) 50 MG tablet Take 50 mg by mouth every 6 (six) hours as needed for moderate pain.     [provider]  warfarin (COUMADIN) 4 MG tablet Take 1 tablet (4 mg total) by mouth daily at 6 PM. 10/24/17   Osvaldo Shipper, MD    Family History Family History    Problem Relation Age of Onset  . Hypertension Father     Social History Social History   Tobacco Use  . Smoking status: Never Smoker  . Smokeless tobacco: Never Used  Substance Use Topics  . Alcohol use: No  . Drug use: No     Allergies   Brilliant blue fcf; Ciprofloxacin; Gemfibrozil; Iohexol; Ivp dye [iodinated diagnostic agents]; Bactrim; and Sulfamethoxazole-trimethoprim   Review of Systems Review of Systems  Unable to perform ROS: Dementia     Physical Exam Updated Vital Signs BP 129/67   Pulse (!) 55   Temp 98.2 F (36.8 C) (Oral)   Resp 20   Ht 5\' 5"  (1.651 m)   Wt 94.8 kg (209 lb)   SpO2 97%   BMI 34.78 kg/m   Physical  Exam  Constitutional: She is oriented to person, place, and time. She appears well-developed and well-nourished. No distress.  HENT:  Head: Normocephalic and atraumatic.  Moist mucous membranes  Eyes: Conjunctivae are normal.  B/l blindness  Neck: Neck supple.  Cardiovascular: Normal rate and regular rhythm.  Murmur heard. Pulmonary/Chest: Breath sounds normal.  Very mildly increased WOB  Abdominal: Soft. Bowel sounds are normal. She exhibits no distension. There is no tenderness.  Musculoskeletal: She exhibits edema (mild BLE).  Neurological: She is alert and oriented to person, place, and time.  Fluent speech  Skin: Skin is warm and dry.  Nursing note and vitals reviewed.    ED Treatments / Results  Labs (all labs ordered are listed, but only abnormal results are displayed) Labs Reviewed  COMPREHENSIVE METABOLIC PANEL - Abnormal; Notable for the following components:      Result Value   Chloride 99 (*)    Glucose, Bld 246 (*)    Creatinine, Ser 1.09 (*)    Calcium 8.6 (*)    Total Protein 6.2 (*)    Albumin 2.7 (*)    GFR calc non Af Amer 42 (*)    GFR calc Af Amer 49 (*)    All other components within normal limits  CBC WITH DIFFERENTIAL/PLATELET - Abnormal; Notable for the following components:   WBC 15.2 (*)     RBC 6.40 (*)    Hemoglobin 16.1 (*)    HCT 49.1 (*)    MCV 76.7 (*)    MCH 25.2 (*)    RDW 17.5 (*)    Neutro Abs 14.1 (*)    All other components within normal limits  URINALYSIS, ROUTINE W REFLEX MICROSCOPIC - Abnormal; Notable for the following components:   APPearance CLOUDY (*)    Hgb urine dipstick MODERATE (*)    Protein, ur 30 (*)    Leukocytes, UA MODERATE (*)    Bacteria, UA MANY (*)    All other components within normal limits  I-STAT CG4 LACTIC ACID, ED - Abnormal; Notable for the following components:   Lactic Acid, Venous 2.94 (*)    All other components within normal limits  I-STAT CHEM 8, ED - Abnormal; Notable for the following components:   Chloride 99 (*)    BUN 21 (*)    Glucose, Bld 224 (*)    Calcium, Ion 1.09 (*)    Hemoglobin 17.7 (*)    HCT 52.0 (*)    All other components within normal limits  I-STAT VENOUS BLOOD GAS, ED - Abnormal; Notable for the following components:   Bicarbonate 33.7 (*)    TCO2 35 (*)    Acid-Base Excess 7.0 (*)    All other components within normal limits  I-STAT TROPONIN, ED - Abnormal; Notable for the following components:   Troponin i, poc 0.09 (*)    All other components within normal limits  URINE CULTURE  CULTURE, BLOOD (ROUTINE X 2)  CULTURE, BLOOD (ROUTINE X 2)  AMMONIA  BRAIN NATRIURETIC PEPTIDE  I-STAT CG4 LACTIC ACID, ED    EKG  EKG Interpretation None       Radiology Dg Chest 2 View  Result Date: 11/03/2017 CLINICAL DATA:  Weakness.  Shortness of breath. EXAM: CHEST  2 VIEW COMPARISON:  10/20/2017 FINDINGS: Progressive enlargement of the cardiac silhouette. Calcific atherosclerotic disease and tortuosity of the aorta. There is no evidence of lobar consolidation or pleural effusion. Chronic coarsening and increase of the interstitial markings. Osseous structures are without acute abnormality.  Soft tissues are grossly normal. IMPRESSION: Progressive enlargement of the cardiac silhouette. Chronic coarsening  of the interstitial markings may represent pulmonary fibrosis and/or interstitial pulmonary edema. Electronically Signed   By: Ted Mcalpine M.D.   On: 11/03/2017 13:13   Ct Head Wo Contrast  Result Date: 11/03/2017 CLINICAL DATA:  Altered mental status with increasing confusion EXAM: CT HEAD WITHOUT CONTRAST TECHNIQUE: Contiguous axial images were obtained from the base of the skull through the vertex without intravenous contrast. COMPARISON:  March 16, 2016 FINDINGS: Brain: Moderate diffuse atrophy is stable. There is no intracranial mass, hemorrhage, extra-axial fluid collection, or midline shift. There is small vessel disease throughout the centra semiovale bilaterally. There is small vessel disease in both anterior limbs of the external capsules. There is a prior small infarct in the left thalamus. There is small vessel disease elsewhere in each thalamus. Elsewhere the gray-white compartments appear normal. No acute infarct is evident. Vascular: There is no appreciable hyperdense vessel. There is calcification in each carotid siphon and distal left vertebral artery regions. Skull: The bony calvarium appears intact. Sinuses/Orbits: Diffuse increased attenuation throughout the right globe is a stable finding. Orbits otherwise appear unremarkable bilaterally. There is mucosal thickening in several ethmoid air cells. Paranasal sinuses elsewhere are clear. Other: Mastoid air cells are clear. IMPRESSION: 1. Stable atrophy with supratentorial small vessel disease. Prior small infarct left thalamus. No acute infarct evident. No hemorrhage or mass. 2.   Foci of arterial vascular calcification noted. 3. Chronic increased attenuation in the right globe. 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 4.   Mucosal thickening in several ethmoid air cells, stable. Electronically Signed   By: Bretta Bang III M.D.   On: 11/03/2017 13:27    Procedures Procedures (including critical care time)  Medications  Ordered in ED Medications  cefTRIAXone (ROCEPHIN) 1 g in dextrose 5 % 50 mL IVPB (not administered)  furosemide (LASIX) injection 40 mg (not administered)     Initial Impression / Assessment and Plan / ED Course  I have reviewed the triage vital signs and the nursing notes.  Pertinent labs & imaging results that were available during my care of the patient were reviewed by me and considered in my medical decision making (see chart for details).     Pt w/ worsening confusion, increased urinary frequency, and shortness of breath worse laying flat overnight.  She was nontoxic on exam, was requiring 2 L oxygen.  Afebrile.  No focal abdominal tenderness.  Lab work shows normal creatinine, glucose 224, reassuring VBG, lactate 2.94, WBC 15.2, normal ammonia, troponin barely positive at 0.09.  No ischemic changes.  UA is consistent with infection.  Head CT negative acute.  Chest x-ray suggest volume overload and I suspect this is the cause of her mildly elevated troponin.  Gave the patient a dose of IV Lasix as well as ceftriaxone.  Discussed admission with [facility] hospitalist, Dr. Marin Olp, and pt admitted for further care.  Final Clinical Impressions(s) / ED Diagnoses   Final diagnoses:  None    ED Discharge Orders    None       Little, Ambrose Finland, MD 11/03/17 1701

## 2017-11-03 NOTE — ED Notes (Signed)
Attempted report x1. When bedside report protocol explained nurse states not ready for report and she will call back.

## 2017-11-03 NOTE — ED Notes (Signed)
Two set of blood cultures collected at this time.

## 2017-11-03 NOTE — Progress Notes (Signed)
Pharmacy Antibiotic Note  Sara Dennis is a 84 y.o. female admitted on 11/03/2017 with UTI.  Pharmacy has been consulted for cefepime dosing.  Recently treated with Rocephin for UTI (klebsiella PNA). Patient continues to have symptoms of UTI.  Plan: Cefepime 1 gram every 24 hours Monitor renal function, LOT, and de-escalation.  Height: 5\' 5"  (165.1 cm) Weight: 209 lb (94.8 kg) IBW/kg (Calculated) : 57  Temp (24hrs), Avg:98.2 F (36.8 C), Min:98.2 F (36.8 C), Max:98.2 F (36.8 C)  Recent Labs  Lab 11/03/17 1234 11/03/17 1455 11/03/17 1504  WBC 15.2*  --   --   CREATININE 1.09* 0.90  --   LATICACIDVEN  --   --  2.94*    Estimated Creatinine Clearance: 44.5 mL/min (by C-G formula based on SCr of 0.9 mg/dL).    Allergies  Allergen Reactions  . Brilliant Blue Fcf Hives  . Ciprofloxacin Itching  . Gemfibrozil Nausea And Vomiting and Other (See Comments)    Sick and give urine infection  . Iohexol Itching  . Ivp Dye [Iodinated Diagnostic Agents] Itching    itching  . Metrizamide Itching    itching  . Bactrim Itching, Nausea And Vomiting and Rash    And diarrhea  . Sulfamethoxazole-Trimethoprim Itching, Nausea And Vomiting and Rash    And diarrhea    Antimicrobials this admission: Rocephin 2/7 x 1 Cefepime 2/7 >>  Microbiology results: none  Thank you for allowing pharmacy to be a part of this patient's care.  Lina Sar Othell Diluzio 11/03/2017 7:20 PM

## 2017-11-03 NOTE — Progress Notes (Signed)
Arrived from Ed 2140. Alert and oriented x3, not situation. Family was at bedside. Call light within reach.

## 2017-11-03 NOTE — ED Notes (Signed)
Patient transported to X-ray 

## 2017-11-04 DIAGNOSIS — R4182 Altered mental status, unspecified: Secondary | ICD-10-CM

## 2017-11-04 DIAGNOSIS — I5033 Acute on chronic diastolic (congestive) heart failure: Secondary | ICD-10-CM

## 2017-11-04 DIAGNOSIS — N390 Urinary tract infection, site not specified: Secondary | ICD-10-CM

## 2017-11-04 DIAGNOSIS — E118 Type 2 diabetes mellitus with unspecified complications: Secondary | ICD-10-CM

## 2017-11-04 DIAGNOSIS — Z794 Long term (current) use of insulin: Secondary | ICD-10-CM

## 2017-11-04 LAB — INFLUENZA PANEL BY PCR (TYPE A & B)
INFLAPCR: NEGATIVE
Influenza B By PCR: NEGATIVE

## 2017-11-04 LAB — CBC
HCT: 45 % (ref 36.0–46.0)
HEMOGLOBIN: 15.1 g/dL — AB (ref 12.0–15.0)
MCH: 25.6 pg — AB (ref 26.0–34.0)
MCHC: 33.6 g/dL (ref 30.0–36.0)
MCV: 76.3 fL — ABNORMAL LOW (ref 78.0–100.0)
Platelets: 178 10*3/uL (ref 150–400)
RBC: 5.9 MIL/uL — AB (ref 3.87–5.11)
RDW: 17.5 % — ABNORMAL HIGH (ref 11.5–15.5)
WBC: 16.7 10*3/uL — AB (ref 4.0–10.5)

## 2017-11-04 LAB — RESPIRATORY PANEL BY PCR
Adenovirus: NOT DETECTED
BORDETELLA PERTUSSIS-RVPCR: NOT DETECTED
CHLAMYDOPHILA PNEUMONIAE-RVPPCR: NOT DETECTED
Coronavirus 229E: NOT DETECTED
Coronavirus HKU1: NOT DETECTED
Coronavirus NL63: NOT DETECTED
Coronavirus OC43: NOT DETECTED
INFLUENZA A H1 2009-RVPPR: NOT DETECTED
INFLUENZA A H1-RVPPCR: NOT DETECTED
Influenza A H3: NOT DETECTED
Influenza A: NOT DETECTED
Influenza B: NOT DETECTED
METAPNEUMOVIRUS-RVPPCR: NOT DETECTED
MYCOPLASMA PNEUMONIAE-RVPPCR: NOT DETECTED
PARAINFLUENZA VIRUS 2-RVPPCR: NOT DETECTED
Parainfluenza Virus 1: NOT DETECTED
Parainfluenza Virus 3: NOT DETECTED
Parainfluenza Virus 4: NOT DETECTED
RESPIRATORY SYNCYTIAL VIRUS-RVPPCR: NOT DETECTED
RHINOVIRUS / ENTEROVIRUS - RVPPCR: NOT DETECTED

## 2017-11-04 LAB — PROTIME-INR
INR: 1.93
Prothrombin Time: 21.9 seconds — ABNORMAL HIGH (ref 11.4–15.2)

## 2017-11-04 LAB — GLUCOSE, CAPILLARY
GLUCOSE-CAPILLARY: 191 mg/dL — AB (ref 65–99)
GLUCOSE-CAPILLARY: 138 mg/dL — AB (ref 65–99)
GLUCOSE-CAPILLARY: 379 mg/dL — AB (ref 65–99)
GLUCOSE-CAPILLARY: 300 mg/dL — AB (ref 65–99)

## 2017-11-04 LAB — BASIC METABOLIC PANEL
ANION GAP: 13 (ref 5–15)
BUN: 25 mg/dL — ABNORMAL HIGH (ref 6–20)
CALCIUM: 8.6 mg/dL — AB (ref 8.9–10.3)
CO2: 25 mmol/L (ref 22–32)
CREATININE: 1.15 mg/dL — AB (ref 0.44–1.00)
Chloride: 101 mmol/L (ref 101–111)
GFR, EST AFRICAN AMERICAN: 46 mL/min — AB (ref >60)
GFR, EST NON AFRICAN AMERICAN: 40 mL/min — AB (ref >60)
Glucose, Bld: 285 mg/dL — ABNORMAL HIGH (ref 65–99)
Potassium: 4.2 mmol/L (ref 3.5–5.1)
SODIUM: 139 mmol/L (ref 135–145)

## 2017-11-04 LAB — TROPONIN I
TROPONIN I: 0.08 ng/mL — AB (ref <0.03)
TROPONIN I: 0.09 ng/mL — AB (ref <0.03)

## 2017-11-04 MED ORDER — WARFARIN SODIUM 6 MG PO TABS
6.0000 mg | ORAL_TABLET | Freq: Once | ORAL | Status: AC
  Administered 2017-11-04: 6 mg via ORAL
  Filled 2017-11-04: qty 1

## 2017-11-04 MED ORDER — TORSEMIDE 20 MG PO TABS
20.0000 mg | ORAL_TABLET | Freq: Two times a day (BID) | ORAL | Status: DC
  Administered 2017-11-04 – 2017-11-06 (×4): 20 mg via ORAL
  Filled 2017-11-04 (×4): qty 1

## 2017-11-04 NOTE — Progress Notes (Signed)
PROGRESS NOTE [facility]   [name]   ZOX:[id number] DOB: 1922-01-06  DOA: 11/03/2017 PCP: Coralie Common, MD   Brief Narrative:  Sara Dennis is a 84 year old female with past medical history of dementia A. fib, diastolic heart failure, ILD, pulmonary hypertension, diabetes type 2, hypothyroidism and CKD presented to the emergency department after family reported that she is not herself.  Patient was recently discharged 2 weeks ago after being treated for pulmonary edema/diastolic heart failure she went home on oxygen intermittently but lately have been using continuously.  Upon ED evaluation patient was found to have UA grossly abnormal, chest x-ray concerning for pulmonary edema.  Patient was admitted for working diagnosis of UTI and acute on chronic respiratory failure with hypoxia.  Subjective: Patient seen and examined, she reported her breathing is back to baseline.  Feeling much better than yesterday.  Denies chest pain, shortness of breath, palpitations and dysuria.  Assessment & Plan: Acute metabolic encephalopathy in setting of dementia -per family mentally back to baseline Likely multifactorial from hypoxia, UTI, and progressive dementia. Treat underlying causes  Acute on chronic respiratory failure with hypoxia - improved  Multifactorial from pulmonary hypertension, interstitial lung disease with possible superimposed pulmonary edema. Agree with increasing prednisone. Patient was started on IV Lasix, but creatinine increased will discontinue for now and resume oral Lasix Oxygen requirement down to baseline  Acute on chronic diastolic heart failure Given IV Lasix, slight bump in Cr will switch to Torsemide 20 mg BID  CXR with possible pulm edema, clinically does not overloaded on my exam.  Monitor Cr closely   Elevated troponin - asymptomatic Flat trend, likely demand ischemia EKG with no acute changes, T wave inversions are been present in prior EKGs No  further workup at this point  UTI Grossly abnormal UA, elevated WBC Urine culture showing more than 100,000 colonies of Klebsiella We will continue cefepime for now pending sensitivities will de-escalate treatment  ILD Stable no wheezing Patient on prednisone 40 for now will taper down prior to discharge.  Diabetes mellitus type 2 CVA slight elevated, Likely due to prednisone Continue current insulin treatment and monitor expect glucose to normalize as prednisone is decreased  A. fib Rate well controlled Patient on warfarin continue per pharmacy recommendation  DVT prophylaxis: Warfarin Code Status: DNR Family Communication: Family at bedside Disposition Plan: Home in 1-2 days   Consultants:   None  Procedures:   None  Antimicrobials: Anti-infectives (From admission, onward)   Start     Dose/Rate Route Frequency Ordered Stop   11/03/17 1930  ceFEPIme (MAXIPIME) 1 g in dextrose 5 % 50 mL IVPB     1 g 100 mL/hr over 30 Minutes Intravenous Every 24 hours 11/03/17 1918     11/03/17 1615  cefTRIAXone (ROCEPHIN) 1 g in dextrose 5 % 50 mL IVPB     1 g 100 mL/hr over 30 Minutes Intravenous  Once 11/03/17 1612 11/03/17 1740          Objective: Vitals:   11/04/17 0200 11/04/17 0539 11/04/17 1029 11/04/17 1427  BP: (!) 133/56 (!) 123/51 (!) 114/50 (!) 119/51  Pulse: (!) 54 60 (!) 54 (!) 50  Resp: 20 20 20 20   Temp: 98.3 F (36.8 C) 98 F (36.7 C) 97.6 F (36.4 C) 98.7 F (37.1 C)  TempSrc: Oral Oral Oral Oral  SpO2: 97% 97% 100% 98%  Weight:      Height:        Intake/Output Summary (Last  24 hours) at 11/04/2017 1744 Last data filed at 11/04/2017 1200 Gross per 24 hour  Intake 980 ml  Output 1450 ml  Net -470 ml   Filed Weights   11/03/17 1130  Weight: 94.8 kg (209 lb)    Examination:  General exam: Appears calm and comfortable  HEENT:  OP moist and clear O2 2L [place] Respiratory system:,Breath sounds slightly diminished, mild bibasilar  crackles Cardiovascular system: S1/S2 heard, RRR. No JVD, murmurs, rubs or gallops Gastrointestinal system: Abdomen is nondistended, soft and nontender. Central nervous system: Alert and oriented. No focal neurological deficits. Extremities: No pedal edema.  Skin: No rashes, lesions or ulcers Psychiatry: Mood & affect appropriate.    Data Reviewed: I have personally reviewed following labs and imaging studies  CBC: Recent Labs  Lab 11/03/17 1234 11/03/17 1455 11/04/17 0332  WBC 15.2*  --  16.7*  NEUTROABS 14.1*  --   --   HGB 16.1* 17.7* 15.1*  HCT 49.1* 52.0* 45.0  MCV 76.7*  --  76.3*  PLT 193  --  178   Basic Metabolic Panel: Recent Labs  Lab 11/03/17 1234 11/03/17 1455 11/04/17 0332  NA 138 139 139  K 4.5 4.5 4.2  CL 99* 99* 101  CO2 27  --  25  GLUCOSE 246* 224* 285*  BUN 19 21* 25*  CREATININE 1.09* 0.90 1.15*  CALCIUM 8.6*  --  8.6*   GFR: Estimated Creatinine Clearance: 34.8 mL/min (A) (by C-G formula based on SCr of 1.15 mg/dL (H)). Liver Function Tests: Recent Labs  Lab 11/03/17 1234  AST 37  ALT 37  ALKPHOS 50  BILITOT 0.9  PROT 6.2*  ALBUMIN 2.7*   No results for input(s): LIPASE, AMYLASE in the last 168 hours. Recent Labs  Lab 11/03/17 1235  AMMONIA 22   Coagulation Profile: Recent Labs  Lab 11/04/17 0920  INR 1.93   Cardiac Enzymes: Recent Labs  Lab 11/03/17 2113 11/04/17 0332 11/04/17 0920  TROPONINI 0.07* 0.08* 0.09*   BNP (last 3 results) No results for input(s): PROBNP in the last 8760 hours. HbA1C: No results for input(s): HGBA1C in the last 72 hours. CBG: Recent Labs  Lab 11/03/17 2201 11/04/17 0611 11/04/17 1123 11/04/17 1633  GLUCAP 399* 191* 138* 379*   Lipid Profile: No results for input(s): CHOL, HDL, LDLCALC, TRIG, CHOLHDL, LDLDIRECT in the last 72 hours. Thyroid Function Tests: No results for input(s): TSH, T4TOTAL, FREET4, T3FREE, THYROIDAB in the last 72 hours. Anemia Panel: No results for input(s):  VITAMINB12, FOLATE, FERRITIN, TIBC, IRON, RETICCTPCT in the last 72 hours. Sepsis Labs: Recent Labs  Lab 11/03/17 1504  LATICACIDVEN 2.94*    Recent Results (from the past 240 hour(s))  Urine culture     Status: Abnormal (Preliminary result)   Collection Time: 11/03/17  2:51 PM  Result Value Ref Range Status   Specimen Description URINE, CATHETERIZED  Final   Special Requests Normal  Final   Culture (A)  Final    >=100,000 COLONIES/mL KLEBSIELLA PNEUMONIAE SUSCEPTIBILITIES TO FOLLOW Performed at [facility], [address], [place], [place] [address]    Report Status PENDING  Incomplete  Culture, blood (routine x 2)     Status: None (Preliminary result)   Collection Time: 11/03/17  4:52 PM  Result Value Ref Range Status   Specimen Description BLOOD RIGHT ARM  Final   Special Requests   Final    BOTTLES DRAWN AEROBIC AND ANAEROBIC Blood Culture adequate volume   Culture  Final    NO GROWTH < 24 HOURS Performed at [facility], [address], [place], [place] [address]    Report Status PENDING  Incomplete  Culture, blood (routine x 2)     Status: None (Preliminary result)   Collection Time: 11/03/17  4:52 PM  Result Value Ref Range Status   Specimen Description BLOOD HAND RIGHT  Final   Special Requests   Final    BOTTLES DRAWN AEROBIC AND ANAEROBIC Blood Culture adequate volume   Culture   Final    NO GROWTH < 24 HOURS Performed at [facility], [address], [place], [place] [address]    Report Status PENDING  Incomplete      Radiology Studies: Dg Chest 2 View  Result Date: 11/03/2017 CLINICAL DATA:  Weakness.  Shortness of breath. EXAM: CHEST  2 VIEW COMPARISON:  10/20/2017 FINDINGS: Progressive enlargement of the cardiac silhouette. Calcific atherosclerotic disease and tortuosity of the aorta. There is no evidence of lobar consolidation or pleural effusion. Chronic coarsening and increase of the interstitial markings. Osseous structures are  without acute abnormality. Soft tissues are grossly normal. IMPRESSION: Progressive enlargement of the cardiac silhouette. Chronic coarsening of the interstitial markings may represent pulmonary fibrosis and/or interstitial pulmonary edema. Electronically Signed   By: Ted Mcalpine M.D.   On: 11/03/2017 13:13   Ct Head Wo Contrast  Result Date: 11/03/2017 CLINICAL DATA:  Altered mental status with increasing confusion EXAM: CT HEAD WITHOUT CONTRAST TECHNIQUE: Contiguous axial images were obtained from the base of the skull through the vertex without intravenous contrast. COMPARISON:  March 16, 2016 FINDINGS: Brain: Moderate diffuse atrophy is stable. There is no intracranial mass, hemorrhage, extra-axial fluid collection, or midline shift. There is small vessel disease throughout the centra semiovale bilaterally. There is small vessel disease in both anterior limbs of the external capsules. There is a prior small infarct in the left thalamus. There is small vessel disease elsewhere in each thalamus. Elsewhere the gray-white compartments appear normal. No acute infarct is evident. Vascular: There is no appreciable hyperdense vessel. There is calcification in each carotid siphon and distal left vertebral artery regions. Skull: The bony calvarium appears intact. Sinuses/Orbits: Diffuse increased attenuation throughout the right globe is a stable finding. Orbits otherwise appear unremarkable bilaterally. There is mucosal thickening in several ethmoid air cells. Paranasal sinuses elsewhere are clear. Other: Mastoid air cells are clear. IMPRESSION: 1. Stable atrophy with supratentorial small vessel disease. Prior small infarct left thalamus. No acute infarct evident. No hemorrhage or mass. 2.   Foci of arterial vascular calcification noted. 3. Chronic increased attenuation in the right globe. 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 4.   Mucosal thickening in several ethmoid air cells, stable.  Electronically Signed   By: Bretta Bang III M.D.   On: 11/03/2017 13:27    Scheduled Meds: . atorvastatin  20 mg Oral q1800  . donepezil  5 mg Oral Daily  . insulin aspart  0-20 Units Subcutaneous TID WC  . insulin glargine  30 Units Subcutaneous BID  . levothyroxine  50 mcg Oral QAC breakfast  . predniSONE  40 mg Oral Q breakfast  . sodium chloride flush  3 mL Intravenous Q12H  . Warfarin - Pharmacist Dosing Inpatient   Does not apply q1800   Continuous Infusions: . sodium chloride    . ceFEPime (MAXIPIME) IV 1 g (11/03/17 1953)     LOS: 1 day    Time spent: Total of 35  minutes spent with pt, greater than 50% of which was spent in discussion of  treatment, counseling and coordination of care   Latrelle Dodrill, MD Pager: Text Page via [email or web address]   If 7PM-7AM, please contact night-coverage [email or web address] 11/04/2017, 5:44 PM

## 2017-11-04 NOTE — Discharge Instructions (Signed)

## 2017-11-04 NOTE — Progress Notes (Signed)
ANTICOAGULATION CONSULT NOTE  Pharmacy Consult:  Coumadin Indication: atrial fibrillation  Allergies  Allergen Reactions  . Brilliant Blue Fcf Hives  . Ciprofloxacin Itching  . Gemfibrozil Nausea And Vomiting and Other (See Comments)    Sick and give urine infection  . Iohexol Itching  . Ivp Dye [Iodinated Diagnostic Agents] Itching    itching  . Metrizamide Itching    itching  . Bactrim Itching, Nausea And Vomiting and Rash    And diarrhea  . Sulfamethoxazole-Trimethoprim Itching, Nausea And Vomiting and Rash    And diarrhea    Patient Measurements: Height: 5\' 5"  (165.1 cm) Weight: 209 lb (94.8 kg) IBW/kg (Calculated) : 57  Vital Signs: Temp: 97.6 F (36.4 C) (02/08 1029) Temp Source: Oral (02/08 1029) BP: 114/50 (02/08 1029) Pulse Rate: 54 (02/08 1029)  Labs: Recent Labs    11/03/17 1234 11/03/17 1455 11/03/17 2113 11/04/17 0332 11/04/17 0920  HGB 16.1* 17.7*  --  15.1*  --   HCT 49.1* 52.0*  --  45.0  --   PLT 193  --   --  178  --   LABPROT  --   --   --   --  21.9*  INR  --   --   --   --  1.93  CREATININE 1.09* 0.90  --  1.15*  --   TROPONINI  --   --  0.07* 0.08* 0.09*    Estimated Creatinine Clearance: 34.8 mL/min (A) (by C-G formula based on SCr of 1.15 mg/dL (H)).    Assessment: 51  YOF continues on Coumadin from PTA for history of AFib.  INR sub-therapeutic today.  Looks like Coumadin dose was not given yesterday; unsure of reason.  Home Coumadin dose: 4mg  PO daily   Goal of Therapy:  INR 2-3 Monitor platelets by anticoagulation protocol: Yes    Plan:  Coumadin 6mg  PO today Daily PT / INR   Nichols Corter D. Laney Potash, PharmD, BCPS Pager:  [phone] 11/04/2017, 11:01 AM

## 2017-11-05 ENCOUNTER — Inpatient Hospital Stay (HOSPITAL_COMMUNITY): Payer: Medicare HMO

## 2017-11-05 DIAGNOSIS — I5032 Chronic diastolic (congestive) heart failure: Secondary | ICD-10-CM

## 2017-11-05 LAB — BASIC METABOLIC PANEL
Anion gap: 11 (ref 5–15)
BUN: 33 mg/dL — AB (ref 6–20)
CHLORIDE: 98 mmol/L — AB (ref 101–111)
CO2: 28 mmol/L (ref 22–32)
Calcium: 8.4 mg/dL — ABNORMAL LOW (ref 8.9–10.3)
Creatinine, Ser: 1.11 mg/dL — ABNORMAL HIGH (ref 0.44–1.00)
GFR calc Af Amer: 48 mL/min — ABNORMAL LOW (ref >60)
GFR calc non Af Amer: 41 mL/min — ABNORMAL LOW (ref >60)
Glucose, Bld: 129 mg/dL — ABNORMAL HIGH (ref 65–99)
POTASSIUM: 4.3 mmol/L (ref 3.5–5.1)
SODIUM: 137 mmol/L (ref 135–145)

## 2017-11-05 LAB — GLUCOSE, CAPILLARY
GLUCOSE-CAPILLARY: 152 mg/dL — AB (ref 65–99)
GLUCOSE-CAPILLARY: 281 mg/dL — AB (ref 65–99)
Glucose-Capillary: 100 mg/dL — ABNORMAL HIGH (ref 65–99)
Glucose-Capillary: 261 mg/dL — ABNORMAL HIGH (ref 65–99)

## 2017-11-05 LAB — URINE CULTURE: Special Requests: NORMAL

## 2017-11-05 LAB — CBC WITH DIFFERENTIAL/PLATELET
Basophils Absolute: 0 10*3/uL (ref 0.0–0.1)
Basophils Relative: 0 %
EOS PCT: 1 %
Eosinophils Absolute: 0.1 10*3/uL (ref 0.0–0.7)
HCT: 42.2 % (ref 36.0–46.0)
HEMOGLOBIN: 14.2 g/dL (ref 12.0–15.0)
Lymphocytes Relative: 8 %
Lymphs Abs: 1.4 10*3/uL (ref 0.7–4.0)
MCH: 25.7 pg — AB (ref 26.0–34.0)
MCHC: 33.6 g/dL (ref 30.0–36.0)
MCV: 76.4 fL — AB (ref 78.0–100.0)
Monocytes Absolute: 1.1 10*3/uL — ABNORMAL HIGH (ref 0.1–1.0)
Monocytes Relative: 6 %
Neutro Abs: 15.4 10*3/uL — ABNORMAL HIGH (ref 1.7–7.7)
Neutrophils Relative %: 85 %
PLATELETS: 174 10*3/uL (ref 150–400)
RBC: 5.52 MIL/uL — AB (ref 3.87–5.11)
RDW: 17.3 % — ABNORMAL HIGH (ref 11.5–15.5)
WBC: 18 10*3/uL — AB (ref 4.0–10.5)

## 2017-11-05 LAB — PROTIME-INR
INR: 1.57
Prothrombin Time: 18.7 seconds — ABNORMAL HIGH (ref 11.4–15.2)

## 2017-11-05 MED ORDER — WARFARIN SODIUM 6 MG PO TABS
6.0000 mg | ORAL_TABLET | Freq: Once | ORAL | Status: AC
  Administered 2017-11-05: 6 mg via ORAL
  Filled 2017-11-05: qty 1

## 2017-11-05 MED ORDER — TORSEMIDE 20 MG PO TABS: 20.0000 mg | ORAL_TABLET | Freq: Two times a day (BID) | ORAL | 0 refills | Status: DC

## 2017-11-05 MED ORDER — DEXTROSE 5 % IV SOLN
1.0000 g | INTRAVENOUS | Status: DC
  Administered 2017-11-05: 1 g via INTRAVENOUS
  Filled 2017-11-05 (×2): qty 10

## 2017-11-05 MED ORDER — CEFPODOXIME PROXETIL 200 MG PO TABS: 200.0000 mg | ORAL_TABLET | Freq: Two times a day (BID) | ORAL | 0 refills | Status: AC

## 2017-11-05 NOTE — Progress Notes (Signed)
PT Note Addition:    SATURATION QUALIFICATIONS: (This note is used to comply with regulatory documentation for home oxygen)  Patient Saturations on Room Air at Rest = 92%  Patient Saturations on Room Air while Ambulating = 81%  Patient Saturations on 2 Liters of oxygen while Ambulating = 95%  Please briefly explain why patient needs home oxygen: Pt with O2 desaturation on RA and symptomatic with weakness and confusion.   Lyanne Co, PT  Acute Rehab Services  [phone]

## 2017-11-05 NOTE — Progress Notes (Signed)
ANTICOAGULATION CONSULT NOTE  Pharmacy Consult:  Coumadin Indication: atrial fibrillation  Allergies  Allergen Reactions  . Brilliant Blue Fcf Hives  . Ciprofloxacin Itching  . Gemfibrozil Nausea And Vomiting and Other (See Comments)    Sick and give urine infection  . Iohexol Itching  . Ivp Dye [Iodinated Diagnostic Agents] Itching    itching  . Metrizamide Itching    itching  . Bactrim Itching, Nausea And Vomiting and Rash    And diarrhea  . Sulfamethoxazole-Trimethoprim Itching, Nausea And Vomiting and Rash    And diarrhea    Patient Measurements: Height: 5\' 5"  (165.1 cm) Weight: 209 lb (94.8 kg) IBW/kg (Calculated) : 57  Vital Signs: Temp: 97.4 F (36.3 C) (02/09 0922) Temp Source: Axillary (02/09 0922) BP: 123/68 (02/09 0922) Pulse Rate: 54 (02/09 0922)  Labs: Recent Labs    11/03/17 1234 11/03/17 1455 11/03/17 2113 11/04/17 0332 11/04/17 0920 11/05/17 0348  HGB 16.1* 17.7*  --  15.1*  --  14.2  HCT 49.1* 52.0*  --  45.0  --  42.2  PLT 193  --   --  178  --  174  LABPROT  --   --   --   --  21.9* 18.7*  INR  --   --   --   --  1.93 1.57  CREATININE 1.09* 0.90  --  1.15*  --  1.11*  TROPONINI  --   --  0.07* 0.08* 0.09*  --      Assessment: 89  YOF continues on Coumadin from PTA for history of AFib. INR remains sub-therapeutic today and dipped to 1.59 from 1.93 previously. Suspect this is due to missed dose on 11/03/17. CBC remains stable.   Home Coumadin dose: 4mg  PO daily  Goal of Therapy:  INR 2-3 Monitor platelets by anticoagulation protocol: Yes    Plan:  1. Repeat Coumadin 6mg  PO today 2. Daily PT / INR  Pollyann Samples, PharmD, BCPS 11/05/2017, 11:00 AM

## 2017-11-05 NOTE — Progress Notes (Signed)
Pharmacy Antibiotic Note Sara Dennis is a 84 y.o. female admitted on 11/03/2017 with UTI. Currently being treated with cefepime but to deescalate to ceftriaxone.  Plan: 1. Ceftriaxone 1 gram IV every 24 hours  2. Will follow peripherally    Height: 5\' 5"  (165.1 cm) Weight: 209 lb (94.8 kg) IBW/kg (Calculated) : 57  Temp (24hrs), Avg:97.8 F (36.6 C), Min:97.4 F (36.3 C), Max:98.7 F (37.1 C)  Recent Labs  Lab 11/03/17 1234 11/03/17 1455 11/03/17 1504 11/04/17 0332 11/05/17 0348  WBC 15.2*  --   --  16.7* 18.0*  CREATININE 1.09* 0.90  --  1.15* 1.11*  LATICACIDVEN  --   --  2.94*  --   --     Thank you for allowing pharmacy to be a part of this patient's care.  Pollyann Samples, PharmD, BCPS 11/05/2017, 11:25 AM

## 2017-11-05 NOTE — Evaluation (Signed)
Physical Therapy Evaluation Patient Details Name: Sara Dennis MRN: [id number] DOB: 1922/08/21 Today's Date: 11/05/2017   History of Present Illness  Patient has past medical history significant for dementia, atrial fibrillation, diastolic heart failure, interstitial lung disease, pulmonary hypertension, diabetes, hypothyroidism, CKD who was recently discharged 2 weeks ago after treatment for pulmonary edema/diastolic heart failure with Lasix, UTI treated with Rocephin and interstitial lung disease treated with initiation of prednisone taper. Granddaughter notes  that at baseline patient who is blind is able to walk slowly with walker to the bathroom. However, in past week pt has had AMS, need for O2 more frequently and difficulty ambulating.   Clinical Impression  Pt admitted with above diagnosis. Pt currently with functional limitations due to the deficits listed below (see PT Problem List). Pt having great difficulty with ambulation, very weak and fatigued. Resting HR 48 bpm (daughter reports is not usually this low). O2 sats decreased to 81% on RA. Returned to 95% on 2L. Pt only able to ambulate 6' before needing to sit. Unable to ambulate back into room but required mod A +2 to transfer from small chair back to recliner. Do not feel that she is ready to go home in this state.   Pt will benefit from skilled PT to increase their independence and safety with mobility to allow discharge to the venue listed below.       Follow Up Recommendations Home health PT;Supervision/Assistance - 24 hour    Equipment Recommendations  None recommended by PT    Recommendations for Other Services       Precautions / Restrictions Precautions Precautions: Fall Precaution Comments: blindness Restrictions Weight Bearing Restrictions: No      Mobility  Bed Mobility               General bed mobility comments: received in chair  Transfers Overall transfer level: Needs assistance Equipment used:  Rolling walker (2 wheeled) Transfers: Sit to/from Stand Sit to Stand: Mod assist;+2 physical assistance         General transfer comment: pt attempted to stand from recliner independently and was unable. Mod A for first successul stand, mostly for fwd wt shift but some power up. Second 2 times that she stood her legs were very weak and she required +2 mod A.   Ambulation/Gait Ambulation/Gait assistance: Mod assist Ambulation Distance (Feet): 6 Feet Assistive device: Rolling walker (2 wheeled) Gait Pattern/deviations: Step-through pattern;Trunk flexed;Shuffle Gait velocity: decreased Gait velocity interpretation: <1.8 ft/sec, indicative of risk for recurrent falls General Gait Details: RW maneuvered due to pt blindness. Pt very fatigued at 6' and had to sit urgently. Chair brought up behind pt. Pt unable to ambulate back into room due to LE weakness. O2 sats down to 81% on RA. Returned to 96% on 2L O2.   Stairs            Wheelchair Mobility    Modified Rankin (Stroke Patients Only)       Balance Overall balance assessment: Needs assistance Sitting-balance support: No upper extremity supported Sitting balance-Leahy Scale: Fair     Standing balance support: Bilateral upper extremity supported Standing balance-Leahy Scale: Poor Standing balance comment: heavy reliance on UE support                             Pertinent Vitals/Pain Pain Assessment: No/denies pain    Home Living Family/patient expects to be discharged to:: Private residence Living Arrangements: Children Available Help  at Discharge: Family;Available 24 hours/day Type of Home: Mobile home Home Access: Stairs to enter Entrance Stairs-Rails: Right;Left Entrance Stairs-Number of Steps: 6 Home Layout: One level Home Equipment: Walker - 2 wheels;Bedside commode;Tub bench;Wheelchair - manual Additional Comments: pt lives with daughter and grandson    Prior Function Level of Independence: Needs  assistance   Gait / Transfers Assistance Needed: prior to last admission was able to walk room to room with RW as well as get up tp BSC in the night independently. Decreased mobility since that admission  ADL's / Homemaking Assistance Needed: has PCA 7 days/week for adL assist        Hand Dominance        Extremity/Trunk Assessment   Upper Extremity Assessment Upper Extremity Assessment: Generalized weakness    Lower Extremity Assessment Lower Extremity Assessment: Generalized weakness    Cervical / Trunk Assessment Cervical / Trunk Assessment: Kyphotic  Communication   Communication: No difficulties  Cognition Arousal/Alertness: Awake/alert Behavior During Therapy: WFL for tasks assessed/performed Overall Cognitive Status: Impaired/Different from baseline Area of Impairment: Memory                     Memory: Decreased short-term memory         General Comments: pt appears to be at baseline until end of session after O2 sats had dropped and she asked when she was going back to her room (she was sitting in her room with her family)      General Comments General comments (skin integrity, edema, etc.): pt's HR 48 bpm at rest. Increased to 80's with ambulation but returned to high 40's shortly after sitting. Daughter reports that this is low for her.     Exercises General Exercises - Lower Extremity Ankle Circles/Pumps: AROM;Both;10 reps;Seated   Assessment/Plan    PT Assessment Patient needs continued PT services  PT Problem List Decreased strength;Decreased activity tolerance;Decreased balance;Decreased mobility;Decreased cognition;Decreased knowledge of use of DME;Decreased knowledge of precautions;Cardiopulmonary status limiting activity       PT Treatment Interventions Gait training;Functional mobility training;Therapeutic activities;Therapeutic exercise;Patient/family education;DME instruction;Stair training    PT Goals (Current goals can be found  in the Care Plan section)  Acute Rehab PT Goals Patient Stated Goal: home soon PT Goal Formulation: With patient Time For Goal Achievement: 11/19/17 Potential to Achieve Goals: Fair    Frequency Min 3X/week   Barriers to discharge Inaccessible home environment 6 ste home    Co-evaluation               AM-PAC PT "6 Clicks" Daily Activity  Outcome Measure Difficulty turning over in bed (including adjusting bedclothes, sheets and blankets)?: Unable Difficulty moving from lying on back to sitting on the side of the bed? : Unable Difficulty sitting down on and standing up from a chair with arms (e.g., wheelchair, bedside commode, etc,.)?: Unable Help needed moving to and from a bed to chair (including a wheelchair)?: A Lot Help needed walking in hospital room?: A Lot Help needed climbing 3-5 steps with a railing? : Total 6 Click Score: 8    End of Session Equipment Utilized During Treatment: Gait belt;Oxygen Activity Tolerance: Patient limited by fatigue Patient left: in chair;with call bell/phone within reach;with family/visitor present Nurse Communication: Mobility status PT Visit Diagnosis: Muscle weakness (generalized) (M62.81);Unsteadiness on feet (R26.81);Other abnormalities of gait and mobility (R26.89)    Time: 4782-9562 PT Time Calculation (min) (ACUTE ONLY): 33 min   Charges:   PT Evaluation $PT Eval Moderate Complexity:  1 Mod PT Treatments $Gait Training: 8-22 mins   PT G Codes:        Lyanne Co, PT  Acute Rehab Services  [phone]   Lawana Chambers Shekira Drummer 11/05/2017, 4:05 PM

## 2017-11-05 NOTE — Discharge Summary (Addendum)
Physician Discharge Summary  Sara Dennis  LKG:[id number]  DOB: 17-May-1922  DOA: 11/03/2017 PCP: Coralie Common, MD  Admit date: 11/03/2017 Discharge date: 11/05/2017  Admitted From: Home  Disposition: Home   Recommendations for Outpatient Follow-up:  1. Follow up with PCP in 1-2 weeks 2. Follow up with pulmonary in 2-3 weeks  3. Please obtain BMP/CBC in one week to monitor WBC and Cr  4. Please follow up on the following pending results: Blood cultures no growth so far   Home Health: PT/OT, Aid   Discharge Condition: Stable  CODE STATUS: DNR  Diet recommendation: Heart Healthy   Brief/Interim Summary: For full details see H&P/Progress note, but in brief, Sara Dennis is a 84 year old female with past medical history of dementia A. fib, diastolic heart failure, ILD, pulmonary hypertension, diabetes type 2, hypothyroidism and CKD presented to the emergency department after family reported that she is not herself.  Patient was recently discharged 2 weeks ago after being treated for pulmonary edema/diastolic heart failure she went home on oxygen intermittently but lately have been using continuously. Upon ED evaluation patient was found to have UA grossly abnormal, chest x-ray concerning for pulmonary edema.  Patient was admitted for working diagnosis of UTI and acute on chronic respiratory failure with hypoxia.  Patient was treated with IV antibiotics and steroids, urine culture showed Klebsiella pneumonia sensitive to ceftriaxone.  Antibiotics were discontinued at patient have clinically improved.  Patient deemed stable for discharge.   Subjective: Patient seen and examined, she is alert and oriented.  Remains afebrile.  No new complaints.  Discharge Diagnoses/Hospital Course:  Acute metabolic encephalopathy in setting of dementia - Resolved  Likely multifactorial from hypoxia, UTI, and progressive dementia. Treat underlying causes  Acute on chronic respiratory failure with hypoxia -  oxygen requirement back to baseline  Multifactorial from pulmonary hypertension, interstitial lung disease with possible superimposed pulmonary edema. I personally review chest x-ray which does not seem to be compatible with pulmonary edema. On exam she is dry with no signs of fluid overload.  Lasix were discontinued and she was switched to torsemide. I believe this is more ILD, therefore will send on steroid taper and follow-up with pulmonary Continue oxygen supplementation as needed  Chronic diastolic heart failure After reviewing chest x-ray, findings not really consistent with pulmonary edema and clinically not fluid overload.  Therefore this is not an acute exacerbations of CHF Given IV Lasix, slight bump in Cr will switch to Torsemide 20 mg BID, will keep in torsemide Follow-up with PCP  Elevated troponin - asymptomatic Flat trend, likely demand ischemia EKG with no acute changes, T wave inversions are been present in prior EKGs No further workup at this point  UTI Urine culture positive for Klebsiella pneumonia, sensitive to ceftriaxone.  Will discharge patient on course of Vantin to complete 7 days of oral antibiotic. Good hygiene discussed with daughter  ILD Stable, may have contributed to hypoxia Will discharge patient on prednisone taper, follow-up with pulmonary  Diabetes mellitus type 2 CBGs stable Continue home medication with no further changes Follow-up with PCP  A. Fib with slow ventricular response Bradycardia Resume home dose warfarin  Sinus bradycardia - asymptomatic EKG shows first-degree AV block Continue to monitor for now as patient is asymptomatic and she has refused pacemaker in the past   All other chronic medical condition were stable during the hospitalization.  On the day of the discharge the patient's vitals were stable, and no other acute medical condition were  reported by patient. the patient was felt safe to be discharge to home.    Discharge Instructions  You were cared for by a hospitalist during your hospital stay. If you have any questions about your discharge medications or the care you received while you were in the hospital after you are discharged, you can call the unit and asked to speak with the hospitalist on call if the hospitalist that took care of you is not available. Once you are discharged, your primary care physician will handle any further medical issues. Please note that NO REFILLS for any discharge medications will be authorized once you are discharged, as it is imperative that you return to your primary care physician (or establish a relationship with a primary care physician if you do not have one) for your aftercare needs so that they can reassess your need for medications and monitor your lab values.  Discharge Instructions    Call MD for:  difficulty breathing, headache or visual disturbances   Complete by:  As directed    Call MD for:  extreme fatigue   Complete by:  As directed    Call MD for:  hives   Complete by:  As directed    Call MD for:  persistant dizziness or light-headedness   Complete by:  As directed    Call MD for:  persistant nausea and vomiting   Complete by:  As directed    Call MD for:  redness, tenderness, or signs of infection (pain, swelling, redness, odor or green/yellow discharge around incision site)   Complete by:  As directed    Call MD for:  severe uncontrolled pain   Complete by:  As directed    Call MD for:  temperature >100.4   Complete by:  As directed    Diet - low sodium heart healthy   Complete by:  As directed    Increase activity slowly   Complete by:  As directed      Allergies as of 11/05/2017      Reactions   Brilliant Blue Fcf Hives   Ciprofloxacin Itching   Gemfibrozil Nausea And Vomiting, Other (See Comments)   Sick and give urine infection   Iohexol Itching   Ivp Dye [iodinated Diagnostic Agents] Itching   itching   Metrizamide Itching    itching   Bactrim Itching, Nausea And Vomiting, Rash   And diarrhea   Sulfamethoxazole-trimethoprim Itching, Nausea And Vomiting, Rash   And diarrhea      Medication List    STOP taking these medications   furosemide 40 MG tablet Commonly known as:  LASIX   guaiFENesin 600 MG 12 hr tablet Commonly known as:  MUCINEX   predniSONE 20 MG tablet Commonly known as:  DELTASONE     TAKE these medications   acetaminophen 325 MG tablet Commonly known as:  TYLENOL Take 2 tablets (650 mg total) by mouth every 6 (six) hours as needed for mild pain (or Fever >/= 101).   atorvastatin 20 MG tablet Commonly known as:  LIPITOR Take 1 tablet (20 mg total) by mouth daily at 6 PM.   cefpodoxime 200 MG tablet Commonly known as:  VANTIN Take 1 tablet (200 mg total) by mouth 2 (two) times daily for 6 days.   cyanocobalamin 500 MCG tablet Take 1 tablet (500 mcg total) by mouth daily.   donepezil 5 MG tablet Commonly known as:  ARICEPT Take 5 mg by mouth daily.   insulin glargine 100 UNIT/ML injection  Commonly known as:  LANTUS Inject 0.3 mLs (30 Units total) into the skin 2 (two) times daily.   levothyroxine 50 MCG tablet Commonly known as:  SYNTHROID, LEVOTHROID Take 50 mcg by mouth daily before breakfast.   torsemide 20 MG tablet Commonly known as:  DEMADEX Take 1 tablet (20 mg total) by mouth 2 (two) times daily.   warfarin 4 MG tablet Commonly known as:  COUMADIN Take 1 tablet (4 mg total) by mouth daily at 6 PM.      Follow-up Information    Hilty, Lisette Abu, MD. Schedule an appointment as soon as possible for a visit in 1 week(s).   Specialty:  Cardiology Why:  Hospital follow-up Contact information: [address] [place] [address] [phone]          Allergies  Allergen Reactions  . Brilliant Blue Fcf Hives  . Ciprofloxacin Itching  . Gemfibrozil Nausea And Vomiting and Other (See Comments)    Sick and give urine infection  . Iohexol  Itching  . Ivp Dye [Iodinated Diagnostic Agents] Itching    itching  . Metrizamide Itching    itching  . Bactrim Itching, Nausea And Vomiting and Rash    And diarrhea  . Sulfamethoxazole-Trimethoprim Itching, Nausea And Vomiting and Rash    And diarrhea    Consultations:  None    Procedures/Studies: Dg Chest 2 View  Result Date: 11/03/2017 CLINICAL DATA:  Weakness.  Shortness of breath. EXAM: CHEST  2 VIEW COMPARISON:  10/20/2017 FINDINGS: Progressive enlargement of the cardiac silhouette. Calcific atherosclerotic disease and tortuosity of the aorta. There is no evidence of lobar consolidation or pleural effusion. Chronic coarsening and increase of the interstitial markings. Osseous structures are without acute abnormality. Soft tissues are grossly normal. IMPRESSION: Progressive enlargement of the cardiac silhouette. Chronic coarsening of the interstitial markings may represent pulmonary fibrosis and/or interstitial pulmonary edema. Electronically Signed   By: Ted Mcalpine M.D.   On: 11/03/2017 13:13   Dg Chest 2 View  Result Date: 10/19/2017 CLINICAL DATA:  Cough for 2 weeks. Shortness of breath on exertion. History of chronic atrial flutter, CHF, diabetes, hypertension, pulmonary embolus. EXAM: CHEST  2 VIEW COMPARISON:  03/16/2016 FINDINGS: Cardiac enlargement with probable pulmonary vascular congestion. Interstitial changes in the lungs probably representing a combination of chronic fibrosis with superimposed interstitial edema. Interstitial changes appear more prominent than on the previous study. No pleural effusions. No pneumothorax. Tortuous aorta. Degenerative changes in the spine. IMPRESSION: Increasing cardiac enlargement and interstitial pattern since previous study likely represents progression of congestive failure and interstitial edema superimposed upon chronic fibrosis. Electronically Signed   By: Burman Nieves M.D.   On: 10/19/2017 00:34   Ct Head Wo  Contrast  Result Date: 11/03/2017 CLINICAL DATA:  Altered mental status with increasing confusion EXAM: CT HEAD WITHOUT CONTRAST TECHNIQUE: Contiguous axial images were obtained from the base of the skull through the vertex without intravenous contrast. COMPARISON:  March 16, 2016 FINDINGS: Brain: Moderate diffuse atrophy is stable. There is no intracranial mass, hemorrhage, extra-axial fluid collection, or midline shift. There is small vessel disease throughout the centra semiovale bilaterally. There is small vessel disease in both anterior limbs of the external capsules. There is a prior small infarct in the left thalamus. There is small vessel disease elsewhere in each thalamus. Elsewhere the gray-white compartments appear normal. No acute infarct is evident. Vascular: There is no appreciable hyperdense vessel. There is calcification in each carotid siphon and distal left vertebral artery  regions. Skull: The bony calvarium appears intact. Sinuses/Orbits: Diffuse increased attenuation throughout the right globe is a stable finding. Orbits otherwise appear unremarkable bilaterally. There is mucosal thickening in several ethmoid air cells. Paranasal sinuses elsewhere are clear. Other: Mastoid air cells are clear. IMPRESSION: 1. Stable atrophy with supratentorial small vessel disease. Prior small infarct left thalamus. No acute infarct evident. No hemorrhage or mass. 2.   Foci of arterial vascular calcification noted. 3. Chronic increased attenuation in the right globe. 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 4.   Mucosal thickening in several ethmoid air cells, stable. Electronically Signed   By: Bretta Bang III M.D.   On: 11/03/2017 13:27   Dg Chest Port 1 View  Result Date: 11/05/2017 CLINICAL DATA:  Shortness of Breath EXAM: PORTABLE CHEST 1 VIEW COMPARISON:  11/03/2017 FINDINGS: Cardiomegaly with vascular congestion. Chronic interstitial prominence likely reflects chronic lung disease/fibrosis.  Difficult to completely exclude a component of interstitial edema. No effusions or acute bony abnormality. IMPRESSION: Cardiomegaly, vascular congestion. Chronic interstitial prominence may reflect chronic lung disease/fibrosis and/or edema. Electronically Signed   By: Charlett Nose M.D.   On: 11/05/2017 11:53   Dg Chest Port 1 View  Result Date: 10/20/2017 CLINICAL DATA:  Worsening cough for 2 weeks. EXAM: PORTABLE CHEST 1 VIEW COMPARISON:  10/19/2017 FINDINGS: Shallow inspiration. Cardiac enlargement with some vascular congestion. Diffuse interstitial pattern throughout the lungs is similar to previous studies. This probably represents diffuse fibrosis with superimposed edema. Mild progression since the previous study. No evidence of pleural effusion or pneumothorax. Degenerative changes in the spine and shoulders. IMPRESSION: Shallow inspiration. Diffuse cardiac enlargement with diffuse interstitial pattern progressing since previous study. This likely represents chronic fibrosis with superimposed edema. Electronically Signed   By: Burman Nieves M.D.   On: 10/20/2017 23:26    Discharge Exam: Vitals:   11/05/17 0600 11/05/17 0922  BP: (!) 148/56 123/68  Pulse: (!) 51 (!) 54  Resp:  18  Temp: 97.6 F (36.4 C) (!) 97.4 F (36.3 C)  SpO2: 100% 98%   Vitals:   11/04/17 2200 11/05/17 0200 11/05/17 0600 11/05/17 0922  BP: (!) 124/43 (!) 153/64 (!) 148/56 123/68  Pulse: (!) 51 (!) 51 (!) 51 (!) 54  Resp: 20 20  18   Temp: (!) 97.4 F (36.3 C) 97.7 F (36.5 C) 97.6 F (36.4 C) (!) 97.4 F (36.3 C)  TempSrc: Oral Oral Oral Axillary  SpO2: 95% 96% 100% 98%  Weight:      Height:        General: Pt is alert, awake, not in acute distress Cardiovascular: RRR, S1/S2 +, no rubs, no gallops Respiratory: CTA bilaterally, no wheezing, no rhonchi Abdominal: Soft, NT, ND, bowel sounds + Extremities: no edema   The results of significant diagnostics from this hospitalization (including imaging,  microbiology, ancillary and laboratory) are listed below for reference.     Microbiology: Recent Results (from the past 240 hour(s))  Urine culture     Status: Abnormal   Collection Time: 11/03/17  2:51 PM  Result Value Ref Range Status   Specimen Description URINE, CATHETERIZED  Final   Special Requests   Final    Normal Performed at [facility], [address], [place], [place] [address]    Culture >=100,000 COLONIES/mL KLEBSIELLA PNEUMONIAE (A)  Final   Report Status 11/05/2017 FINAL  Final   Organism ID, Bacteria KLEBSIELLA PNEUMONIAE (A)  Final      Susceptibility   Klebsiella pneumoniae - MIC*  AMPICILLIN >=32 RESISTANT Resistant     CEFAZOLIN <=4 SENSITIVE Sensitive     CEFTRIAXONE <=1 SENSITIVE Sensitive     CIPROFLOXACIN <=0.25 SENSITIVE Sensitive     GENTAMICIN <=1 SENSITIVE Sensitive     IMIPENEM <=0.25 SENSITIVE Sensitive     NITROFURANTOIN 128 RESISTANT Resistant     TRIMETH/SULFA <=20 SENSITIVE Sensitive     AMPICILLIN/SULBACTAM 8 SENSITIVE Sensitive     PIP/TAZO <=4 SENSITIVE Sensitive     Extended ESBL NEGATIVE Sensitive     * >=100,000 COLONIES/mL KLEBSIELLA PNEUMONIAE  Culture, blood (routine x 2)     Status: None (Preliminary result)   Collection Time: 11/03/17  4:52 PM  Result Value Ref Range Status   Specimen Description BLOOD RIGHT ARM  Final   Special Requests   Final    BOTTLES DRAWN AEROBIC AND ANAEROBIC Blood Culture adequate volume   Culture   Final    NO GROWTH 2 DAYS Performed at [facility], [address], [place], [place] [address]    Report Status PENDING  Incomplete  Culture, blood (routine x 2)     Status: None (Preliminary result)   Collection Time: 11/03/17  4:52 PM  Result Value Ref Range Status   Specimen Description BLOOD HAND RIGHT  Final   Special Requests   Final    BOTTLES DRAWN AEROBIC AND ANAEROBIC Blood Culture adequate volume   Culture   Final    NO GROWTH 2 DAYS Performed at [facility], [address], [place], [place] [address]    Report Status PENDING  Incomplete  Respiratory Panel by PCR     Status: None   Collection Time: 11/04/17  2:08 PM  Result Value Ref Range Status   Adenovirus NOT DETECTED NOT DETECTED Final   Coronavirus 229E NOT DETECTED NOT DETECTED Final   Coronavirus HKU1 NOT DETECTED NOT DETECTED Final   Coronavirus NL63 NOT DETECTED NOT DETECTED Final   Coronavirus OC43 NOT DETECTED NOT DETECTED Final   Metapneumovirus NOT DETECTED NOT DETECTED Final   Rhinovirus / Enterovirus NOT DETECTED NOT DETECTED Final   Influenza A NOT DETECTED NOT DETECTED Corrected   Influenza A H1 NOT DETECTED NOT DETECTED Corrected   Influenza A H1 2009 NOT DETECTED NOT DETECTED Final   Influenza A H3 NOT DETECTED NOT DETECTED Final   Influenza B NOT DETECTED NOT DETECTED Final   Parainfluenza Virus 1 NOT DETECTED NOT DETECTED Final   Parainfluenza Virus 2 NOT DETECTED NOT DETECTED Final   Parainfluenza Virus 3 NOT DETECTED NOT DETECTED Final   Parainfluenza Virus 4 NOT DETECTED NOT DETECTED Final   Respiratory Syncytial Virus NOT DETECTED NOT DETECTED Final   Bordetella pertussis NOT DETECTED NOT DETECTED Final   Chlamydophila pneumoniae NOT DETECTED NOT DETECTED Final   Mycoplasma pneumoniae NOT DETECTED NOT DETECTED Final    Comment: Performed at [facility], [address], [place], [place] [address]     Labs: BNP (last 3 results) Recent Labs    10/24/17 0644 11/03/17 2113  BNP 566.0* 233.0*   Basic Metabolic Panel: Recent Labs  Lab 11/03/17 1234 11/03/17 1455 11/04/17 0332 11/05/17 0348  NA 138 139 139 137  K 4.5 4.5 4.2 4.3  CL 99* 99* 101 98*  CO2 27  --  25 28  GLUCOSE 246* 224* 285* 129*  BUN 19 21* 25* 33*  CREATININE 1.09* 0.90 1.15* 1.11*  CALCIUM 8.6*  --  8.6* 8.4*   Liver Function Tests:  Recent Labs  Lab 11/03/17 1234  AST 37  ALT 37  ALKPHOS 50  BILITOT 0.9  PROT 6.2*  ALBUMIN 2.7*   No results for input(s): LIPASE, AMYLASE in  the last 168 hours. Recent Labs  Lab 11/03/17 1235  AMMONIA 22   CBC: Recent Labs  Lab 11/03/17 1234 11/03/17 1455 11/04/17 0332 11/05/17 0348  WBC 15.2*  --  16.7* 18.0*  NEUTROABS 14.1*  --   --  15.4*  HGB 16.1* 17.7* 15.1* 14.2  HCT 49.1* 52.0* 45.0 42.2  MCV 76.7*  --  76.3* 76.4*  PLT 193  --  178 174   Cardiac Enzymes: Recent Labs  Lab 11/03/17 2113 11/04/17 0332 11/04/17 0920  TROPONINI 0.07* 0.08* 0.09*   BNP: Invalid input(s): POCBNP CBG: Recent Labs  Lab 11/04/17 1123 11/04/17 1633 11/04/17 2141 11/05/17 0615 11/05/17 1138  GLUCAP 138* 379* 300* 100* 152*   D-Dimer No results for input(s): DDIMER in the last 72 hours. Hgb A1c No results for input(s): HGBA1C in the last 72 hours. Lipid Profile No results for input(s): CHOL, HDL, LDLCALC, TRIG, CHOLHDL, LDLDIRECT in the last 72 hours. Thyroid function studies No results for input(s): TSH, T4TOTAL, T3FREE, THYROIDAB in the last 72 hours.  Invalid input(s): FREET3 Anemia work up No results for input(s): VITAMINB12, FOLATE, FERRITIN, TIBC, IRON, RETICCTPCT in the last 72 hours. Urinalysis    Component Value Date/Time   COLORURINE YELLOW 11/03/2017 1451   APPEARANCEUR CLOUDY (A) 11/03/2017 1451   LABSPEC 1.010 11/03/2017 1451   PHURINE 5.0 11/03/2017 1451   GLUCOSEU NEGATIVE 11/03/2017 1451   HGBUR MODERATE (A) 11/03/2017 1451   BILIRUBINUR NEGATIVE 11/03/2017 1451   KETONESUR NEGATIVE 11/03/2017 1451   PROTEINUR 30 (A) 11/03/2017 1451   UROBILINOGEN 0.2 06/13/2015 1105   NITRITE NEGATIVE 11/03/2017 1451   LEUKOCYTESUR MODERATE (A) 11/03/2017 1451   Sepsis Labs Invalid input(s): PROCALCITONIN,  WBC,  LACTICIDVEN Microbiology Recent Results (from the past 240 hour(s))  Urine culture     Status: Abnormal   Collection Time: 11/03/17  2:51 PM  Result Value Ref Range Status   Specimen Description URINE, CATHETERIZED  Final   Special Requests   Final    Normal Performed at [facility], [address], [place], [place] [address]    Culture >=100,000 COLONIES/mL KLEBSIELLA PNEUMONIAE (A)  Final   Report Status 11/05/2017 FINAL  Final   Organism ID, Bacteria KLEBSIELLA PNEUMONIAE (A)  Final      Susceptibility   Klebsiella pneumoniae - MIC*    AMPICILLIN >=32 RESISTANT Resistant     CEFAZOLIN <=4 SENSITIVE Sensitive     CEFTRIAXONE <=1 SENSITIVE Sensitive     CIPROFLOXACIN <=0.25 SENSITIVE Sensitive     GENTAMICIN <=1 SENSITIVE Sensitive     IMIPENEM <=0.25 SENSITIVE Sensitive     NITROFURANTOIN 128 RESISTANT Resistant     TRIMETH/SULFA <=20 SENSITIVE Sensitive     AMPICILLIN/SULBACTAM 8 SENSITIVE Sensitive     PIP/TAZO <=4 SENSITIVE Sensitive     Extended ESBL NEGATIVE Sensitive     * >=100,000 COLONIES/mL KLEBSIELLA PNEUMONIAE  Culture, blood (routine x 2)     Status: None (Preliminary result)   Collection Time: 11/03/17  4:52 PM  Result Value Ref Range Status   Specimen Description BLOOD RIGHT ARM  Final   Special Requests   Final    BOTTLES DRAWN AEROBIC AND ANAEROBIC Blood Culture adequate volume   Culture   Final    NO GROWTH 2 DAYS Performed  at [facility], [address], [place], [place] [address]    Report Status PENDING  Incomplete  Culture, blood (routine x 2)     Status: None (Preliminary result)   Collection Time: 11/03/17  4:52 PM  Result Value Ref Range Status   Specimen Description BLOOD HAND RIGHT  Final   Special Requests   Final    BOTTLES DRAWN AEROBIC AND ANAEROBIC Blood Culture adequate volume   Culture   Final    NO GROWTH 2 DAYS Performed at [facility], [address] [place], [place] [address]    Report Status PENDING  Incomplete  Respiratory Panel by PCR     Status: None   Collection Time: 11/04/17  2:08 PM  Result Value Ref Range Status   Adenovirus NOT DETECTED NOT DETECTED Final   Coronavirus 229E NOT DETECTED NOT DETECTED Final   Coronavirus HKU1 NOT DETECTED NOT DETECTED Final   Coronavirus NL63  NOT DETECTED NOT DETECTED Final   Coronavirus OC43 NOT DETECTED NOT DETECTED Final   Metapneumovirus NOT DETECTED NOT DETECTED Final   Rhinovirus / Enterovirus NOT DETECTED NOT DETECTED Final   Influenza A NOT DETECTED NOT DETECTED Corrected   Influenza A H1 NOT DETECTED NOT DETECTED Corrected   Influenza A H1 2009 NOT DETECTED NOT DETECTED Final   Influenza A H3 NOT DETECTED NOT DETECTED Final   Influenza B NOT DETECTED NOT DETECTED Final   Parainfluenza Virus 1 NOT DETECTED NOT DETECTED Final   Parainfluenza Virus 2 NOT DETECTED NOT DETECTED Final   Parainfluenza Virus 3 NOT DETECTED NOT DETECTED Final   Parainfluenza Virus 4 NOT DETECTED NOT DETECTED Final   Respiratory Syncytial Virus NOT DETECTED NOT DETECTED Final   Bordetella pertussis NOT DETECTED NOT DETECTED Final   Chlamydophila pneumoniae NOT DETECTED NOT DETECTED Final   Mycoplasma pneumoniae NOT DETECTED NOT DETECTED Final    Comment: Performed at [facility], [address], [place], [place] [address]    Time coordinating discharge: 32 minutes  SIGNED:  Latrelle Dodrill, MD  [facility] 11/05/2017, 1:13 PM  Pager please text page via  [email or web address]

## 2017-11-05 NOTE — Progress Notes (Signed)
Patient is active with [facility] as prior to admission. Adacia with [facility] called and made aware of discharge home today. Abelino Derrick [facility] [phone]

## 2017-11-06 LAB — GLUCOSE, CAPILLARY
GLUCOSE-CAPILLARY: 110 mg/dL — AB (ref 65–99)
Glucose-Capillary: 77 mg/dL (ref 65–99)
Glucose-Capillary: 127 mg/dL — ABNORMAL HIGH (ref 65–99)

## 2017-11-06 LAB — PROTIME-INR
INR: 1.52
PROTHROMBIN TIME: 18.2 s — AB (ref 11.4–15.2)

## 2017-11-06 MED ORDER — PREDNISONE 10 MG PO TABS: ORAL_TABLET | ORAL | 0 refills | Status: DC

## 2017-11-06 MED ORDER — WARFARIN SODIUM 4 MG PO TABS
4.0000 mg | ORAL_TABLET | Freq: Once | ORAL | Status: DC
  Filled 2017-11-06: qty 1

## 2017-11-06 NOTE — Care Management Note (Signed)
Case Management Note  Patient Details  Name: Sara Dennis MRN: [id number] Date of Birth: [birth year]  Subjective/Objective:   [facility] called for Transport to home.                  Action/Plan:CM will sign off for now but will be available should additional discharge needs arise or disposition change.    Expected Discharge Date:  11/05/17               Expected Discharge Plan:  Home w Home Health Services  In-House Referral:  NA  Discharge planning Services  CM Consult  Post Acute Care Choice:  Home Health, Resumption of Svcs/PTA Provider Choice offered to:  Adult Children  DME Arranged:  Oxygen DME Agency:  [facility]  HH Arranged:  PT HH Agency:  [facility]  Status of Service:  Completed, signed off  If discussed at Long Length of Stay Meetings, dates discussed:    Additional Comments:  Yvone Neu, RN 11/06/2017, 3:32 PM

## 2017-11-06 NOTE — Progress Notes (Signed)
[facility]  Patient was discharged yesterday, but family refused in order for her to receive physical therapy.  Patient continues to be stable, she ambulated with physical therapy with minimal assist.  This sat on room air to 88%, but saturation up to 97% with 2 L nasal cannula.  Family concerned about bradycardia, EKG check shows first-degree AV block, etiology evaluation in the past recommended to monitor given patient asymptomatic.  Patient will be discharged home with home health PT/OT.  To complete oral antibiotics and on a prednisone taper to follow-up with pulmonary doctor.  Rest per discharge summary done on 2/9 2019  Sara Dodrill, MD

## 2017-11-06 NOTE — Progress Notes (Signed)
Discharge instructions provided to patient and daughter, denies questions/concerns at this time. PIV removed, patient transported home via [facility].

## 2017-11-06 NOTE — Progress Notes (Addendum)
Physical Therapy Treatment Patient Details Name: Sara Dennis MRN: [id number] DOB: July 10, 1922 Today's Date: 11/06/2017    History of Present Illness Patient has past medical history significant for dementia, atrial fibrillation, diastolic heart failure, interstitial lung disease, pulmonary hypertension, diabetes, hypothyroidism, CKD who was recently discharged 2 weeks ago after treatment for pulmonary edema/diastolic heart failure with Lasix, UTI treated with Rocephin and interstitial lung disease treated with initiation of prednisone taper. Granddaughter notes  that at baseline patient who is blind is able to walk slowly with walker to the bathroom. However, in past week pt has had AMS, need for O2 more frequently and difficulty ambulating.     PT Comments    Pt in good spirits this morning and agreeable to participation in therapy. Min assist required for bed mobility, transfers, and gait 7 feet with RW. Assist with RW management needed due to blindness. Pt fatigues quickly. Desat to 88% on RA during mobility. SpO2 97% on 2 L O2 at rest.    Follow Up Recommendations  Home health PT;Supervision/Assistance - 24 hour     Equipment Recommendations  None recommended by PT    Recommendations for Other Services       Precautions / Restrictions Precautions Precautions: Fall;Other (comment) Precaution Comments: blindness    Mobility  Bed Mobility   Bed Mobility: Supine to Sit     Supine to sit: Min assist;HOB elevated     General bed mobility comments: +rail, increased time and effort  Transfers Overall transfer level: Needs assistance Equipment used: Rolling walker (2 wheeled) Transfers: Sit to/from Stand Sit to Stand: Min assist         General transfer comment: multi attempts to stand at min assist level; use of momentum   Ambulation/Gait Ambulation/Gait assistance: Min assist;+2 safety/equipment Ambulation Distance (Feet): 7 Feet Assistive device: Rolling walker (2  wheeled) Gait Pattern/deviations: Step-through pattern;Trunk flexed;Decreased stride length Gait velocity: decreased Gait velocity interpretation: <1.8 ft/sec, indicative of risk for recurrent falls General Gait Details: assist with RW management due to blindness. No LOB noted. After ambulating 7 feet, pt stating "okay, now I need to sit down." No SOB noted. SpO2 97% on 2 L prior to ambulation. Pt ambulated on RA with desat to 88%.  +2 utilized for chair follow.    Stairs            Wheelchair Mobility    Modified Rankin (Stroke Patients Only)       Balance   Sitting-balance support: No upper extremity supported;Feet supported Sitting balance-Leahy Scale: Good     Standing balance support: Bilateral upper extremity supported;During functional activity Standing balance-Leahy Scale: Poor Standing balance comment: heavy reliance on UE support                            Cognition Arousal/Alertness: Awake/alert Behavior During Therapy: WFL for tasks assessed/performed Overall Cognitive Status: Within Functional Limits for tasks assessed                                        Exercises      General Comments        Pertinent Vitals/Pain Pain Assessment: No/denies pain    Home Living                      Prior Function  PT Goals (current goals can now be found in the care plan section) Acute Rehab PT Goals Patient Stated Goal: home soon PT Goal Formulation: With patient Time For Goal Achievement: 11/19/17 Potential to Achieve Goals: Fair Progress towards PT goals: Progressing toward goals    Frequency    Min 3X/week      PT Plan Current plan remains appropriate    Co-evaluation              AM-PAC PT "6 Clicks" Daily Activity  Outcome Measure  Difficulty turning over in bed (including adjusting bedclothes, sheets and blankets)?: A Lot Difficulty moving from lying on back to sitting on the side  of the bed? : A Lot Difficulty sitting down on and standing up from a chair with arms (e.g., wheelchair, bedside commode, etc,.)?: A Lot Help needed moving to and from a bed to chair (including a wheelchair)?: A Little Help needed walking in hospital room?: A Little Help needed climbing 3-5 steps with a railing? : A Lot 6 Click Score: 14    End of Session Equipment Utilized During Treatment: Gait belt;Oxygen Activity Tolerance: Patient limited by fatigue Patient left: in chair;with call bell/phone within reach;with nursing/sitter in room;with chair alarm set Nurse Communication: Mobility status PT Visit Diagnosis: Muscle weakness (generalized) (M62.81);Unsteadiness on feet (R26.81);Other abnormalities of gait and mobility (R26.89)     Time: 1610-9604 PT Time Calculation (min) (ACUTE ONLY): 20 min  Charges:  $Gait Training: 8-22 mins                    G Codes:       Aida Raider, PT  Office # [phone] Pager [phone]    Ilda Foil 11/06/2017, 10:54 AM

## 2017-11-06 NOTE — Progress Notes (Signed)
ANTICOAGULATION CONSULT NOTE  Pharmacy Consult:  Coumadin Indication: atrial fibrillation  Allergies  Allergen Reactions  . Brilliant Blue Fcf Hives  . Ciprofloxacin Itching  . Gemfibrozil Nausea And Vomiting and Other (See Comments)    Sick and give urine infection  . Iohexol Itching  . Ivp Dye [Iodinated Diagnostic Agents] Itching    itching  . Metrizamide Itching    itching  . Bactrim Itching, Nausea And Vomiting and Rash    And diarrhea  . Sulfamethoxazole-Trimethoprim Itching, Nausea And Vomiting and Rash    And diarrhea    Patient Measurements: Height: 5\' 5"  (165.1 cm) Weight: 209 lb (94.8 kg) IBW/kg (Calculated) : 57  Vital Signs: Temp: 98 F (36.7 C) (02/10 1031) Temp Source: Oral (02/10 1031) BP: 119/56 (02/10 1031) Pulse Rate: 51 (02/10 1031)  Labs: Recent Labs    11/03/17 1234 11/03/17 1455 11/03/17 2113 11/04/17 0332 11/04/17 0920 11/05/17 0348 11/06/17 0901  HGB 16.1* 17.7*  --  15.1*  --  14.2  --   HCT 49.1* 52.0*  --  45.0  --  42.2  --   PLT 193  --   --  178  --  174  --   LABPROT  --   --   --   --  21.9* 18.7* 18.2*  INR  --   --   --   --  1.93 1.57 1.52  CREATININE 1.09* 0.90  --  1.15*  --  1.11*  --   TROPONINI  --   --  0.07* 0.08* 0.09*  --   --      Assessment: 93  YOF continues on Coumadin from PTA for history of AFib. INR remains sub-therapeutic today 1.52 but appears to have plateaud off after receiving 6 mg doses for last 2 days.   Home Coumadin dose: 4mg  PO daily  Goal of Therapy:  INR 2-3 Monitor platelets by anticoagulation protocol: Yes    Plan:  1. Coumadin 4 mg PO x 1 this evening  2. Daily PT / INR  Pollyann Samples, PharmD, BCPS 11/06/2017, 11:09 AM

## 2017-11-06 NOTE — Care Management Note (Signed)
Case Management Note  Patient Details  Name: HAZEL SAFFEL MRN: [id number] Date of Birth: 1922-06-08  Subjective/Objective:  Pt to be discharged via ambulance today to home. CM spoke with Daughter, [name] [phone], and Bedside RN Corlinda to coordinate. Jermaine with [facility] has been notified of increase in O2 requirements and CM LM for MD to contact me as we will need new O2 orders. Daughter indicates prior orders were for PRN use only.  [facility] is aware of HHPT needs. CM will arrange transport home via ambulance when ready.                 Action/Plan:CM will follow closely for disposition/discharge needs.    Expected Discharge Date:  11/05/17               Expected Discharge Plan:  Home w Home Health Services  In-House Referral:  NA  Discharge planning Services  CM Consult  Post Acute Care Choice:  Home Health, Resumption of Svcs/PTA Provider Choice offered to:  Adult Children  DME Arranged:  Oxygen DME Agency:  [facility]  HH Arranged:  PT HH Agency:  [facility]  Status of Service:  Completed, signed off  If discussed at Long Length of Stay Meetings, dates discussed:    Additional Comments:  Yvone Neu, RN 11/06/2017, 9:32 AM

## 2017-11-08 LAB — CULTURE, BLOOD (ROUTINE X 2)
CULTURE: NO GROWTH
Culture: NO GROWTH
SPECIAL REQUESTS: ADEQUATE
SPECIAL REQUESTS: ADEQUATE

## 2017-11-10 ENCOUNTER — Ambulatory Visit: Payer: Medicare HMO | Admitting: Internal Medicine

## 2017-11-17 ENCOUNTER — Ambulatory Visit: Payer: Medicare HMO | Admitting: Physician Assistant

## 2017-12-12 ENCOUNTER — Encounter: Payer: Self-pay | Admitting: Physician Assistant

## 2017-12-12 ENCOUNTER — Ambulatory Visit (INDEPENDENT_AMBULATORY_CARE_PROVIDER_SITE_OTHER): Payer: Medicare HMO | Admitting: Physician Assistant

## 2017-12-12 VITALS — BP 101/65 | HR 66 | Ht 65.0 in

## 2017-12-12 DIAGNOSIS — E119 Type 2 diabetes mellitus without complications: Secondary | ICD-10-CM | POA: Diagnosis not present

## 2017-12-12 DIAGNOSIS — I482 Chronic atrial fibrillation: Secondary | ICD-10-CM

## 2017-12-12 DIAGNOSIS — I5032 Chronic diastolic (congestive) heart failure: Secondary | ICD-10-CM | POA: Diagnosis not present

## 2017-12-12 DIAGNOSIS — E039 Hypothyroidism, unspecified: Secondary | ICD-10-CM

## 2017-12-12 DIAGNOSIS — I2782 Chronic pulmonary embolism: Secondary | ICD-10-CM

## 2017-12-12 DIAGNOSIS — I4510 Unspecified right bundle-branch block: Secondary | ICD-10-CM

## 2017-12-12 DIAGNOSIS — I272 Pulmonary hypertension, unspecified: Secondary | ICD-10-CM

## 2017-12-12 DIAGNOSIS — I4821 Permanent atrial fibrillation: Secondary | ICD-10-CM

## 2017-12-12 DIAGNOSIS — J849 Interstitial pulmonary disease, unspecified: Secondary | ICD-10-CM

## 2017-12-12 DIAGNOSIS — N183 Chronic kidney disease, stage 3 unspecified: Secondary | ICD-10-CM

## 2017-12-12 DIAGNOSIS — R079 Chest pain, unspecified: Secondary | ICD-10-CM

## 2017-12-12 MED ORDER — ISOSORBIDE MONONITRATE ER 30 MG PO TB24: 15.0000 mg | ORAL_TABLET | Freq: Every day | ORAL | 3 refills | Status: DC

## 2017-12-12 NOTE — Patient Instructions (Signed)
Medication Instructions:  START Imdur 15mg  Take 1 tablet once a day   Labwork: Your physician recommends that you return for lab work in: TODAY-BMP, BNP,   Testing/Procedures: None   Follow-Up: Your physician recommends that you schedule a follow-up appointment in: 2 months with Dr Rennis Golden.  Any Other Special Instructions Will Be Listed Below (If Applicable).  If you need a refill on your cardiac medications before your next appointment, please call your pharmacy.

## 2017-12-12 NOTE — Progress Notes (Signed)
Cardiology Office Note    Date:  12/13/2017   ID:  Sara Dennis, DOB Jun 26, 1922, MRN [id number]  PCP:  Coralie Common, MD  Cardiologist:  Dr. Rennis Golden   Chief Complaint  Patient presents with  . Follow-up    seen for Dr. Rennis Golden, chest pain    History of Present Illness:  Sara Dennis is a 84 y.o. female with PMH of DM II, hypothyroidism, history of recurrent PE (1978 and 2012), glaucoma was significant with vision loss, permanent atrial fibrillation/atrial flutter on Coumadin, chronic diastolic heart failure, CKD stage III, dementia, interstitial lung disease, moderate to severe pulmonary HTN, RBBB and bradycardia.  Echocardiogram obtained on 06/12/2015 showed EF 60-65%, severe LVH, grade 2 DD, moderate TR, PA peak pressure 73 mmHg.  Patient was last seen in the cardiology office in 2016.  Patient was admitted to the hospital in January 2019 with productive cough for 2 weeks and a generalized weakness.  Chest x-ray showed pulmonary edema.  O2 saturation dropped down to 86% with ambulation.  Cardiology service consulted, repeat echocardiogram obtained on 10/19/2017 showed EF 65-70%, moderately to severely decreased RVEF, peak PA pressure 78 mmHg.  Her dry weight on discharge was 209 pounds.  She was discharged home on oxygen as needed.  Since then, patient was readmitted on 11/03/2017 with altered mental status.  She was diagnosed with UTI and apparent hypoxia.  She was also found to be volume depleted secondary to decreased p.o. Intake.  She was treated for antibiotic with improvement in the symptoms.  She was discharged on steroid taper.  She was eventually discharged on 20 mg twice a day of torsemide.  Patient presents today along with her daughter for cardiology office visit.  Her Coumadin level is being followed by [facility].  Her daughter thinks her weight is around 230, however also admits she was unable to check her weight at home.  She is unable to have her weight checked here  as she is wheelchair-bound.  She does not have significant lower extremity edema.  She does have prominent left basilar crackles, however suspicion for pulmonary edema is low, I think the pulmonary crackle is related to her interstitial lung disease.  She also has been complaining of occasional chest pain and upper back pain when she tried to get off her wheelchair.  It sounds like it only occurred during exertion.  She denies any reproducible pain with neck rotation or palpation.  It is possible that her upper back pain and chest pain is anginal symptom.  EKG today also showed worsening T wave inversion in inferior leads and also lateral leads.  I have reviewed the EKG with DOD Dr. Duke Salvia, given her advanced age, she is not a candidate for cardiac catheterization on the invasive study.  We will add 15 mg daily of Imdur to help with her potential anginal symptom.   Past Medical History:  Diagnosis Date  . Arthritis    left leg  . Bradycardia    a. 05/2015: HR 40s-50s, occ 30s at night - not clear how much symptoms were r/t this, patient declined PPM at that time.  . Chronic atrial flutter (HCC)   . Chronic diastolic CHF (congestive heart failure) (HCC)   . CKD (chronic kidney disease), stage III (HCC)   . COPD (chronic obstructive pulmonary disease) (HCC)   . Diabetes mellitus   . Dyspnea   . Glaucoma    a. with significantly decreased vision.  . Hypothyroidism   .  Mild dementia   . Moderate to severe pulmonary hypertension (HCC) By echo 05/2015  . Permanent atrial fibrillation (HCC)   . Pulmonary embolus (HCC) 03-23-11   a. 1978 and 2012.  . Pulmonary fibrosis, unspecified (HCC) 10/26/2017  . RBBB     Past Surgical History:  Procedure Laterality Date  . CYSTOSCOPY W/ URETERAL STENT PLACEMENT  09/06/2011   Procedure: CYSTOSCOPY WITH RETROGRADE PYELOGRAM/URETERAL STENT PLACEMENT;  Surgeon: Valetta Fuller, MD;  Location: [facility];  Service: Urology;  Laterality: Right;  cysto double j stent  placement  . EYE SURGERY    . kisney stone removal  1978   removed thru bladder  . URETEROSCOPY  09/06/2011   Procedure: URETEROSCOPY;  Surgeon: Valetta Fuller, MD;  Location: [facility];  Service: Urology;  Laterality: Right;    Current Medications: Outpatient Medications Prior to Visit  Medication Sig Dispense Refill  . acetaminophen (TYLENOL) 325 MG tablet Take 2 tablets (650 mg total) by mouth every 6 (six) hours as needed for mild pain (or Fever >/= 101).    Marland Kitchen atorvastatin (LIPITOR) 20 MG tablet Take 1 tablet (20 mg total) by mouth daily at 6 PM. 30 tablet 1  . cyanocobalamin 500 MCG tablet Take 1 tablet (500 mcg total) by mouth daily. 30 tablet 0  . donepezil (ARICEPT) 5 MG tablet Take 5 mg by mouth daily.     . furosemide (LASIX) 40 MG tablet Take 40 mg by mouth daily.  2  . insulin glargine (LANTUS) 100 UNIT/ML injection Inject 0.3 mLs (30 Units total) into the skin 2 (two) times daily. 10 mL 11  . levothyroxine (SYNTHROID, LEVOTHROID) 50 MCG tablet Take 50 mcg by mouth daily before breakfast.    . warfarin (COUMADIN) 4 MG tablet Take 1 tablet (4 mg total) by mouth daily at 6 PM. 30 tablet 0  . predniSONE (DELTASONE) 10 MG tablet Take 4 tablets for 3 days; Take 3 tablets for 4 days; Take 2 tablets for 3 days; Take 1 tablet for 4 days 34 tablet 0  . torsemide (DEMADEX) 20 MG tablet Take 1 tablet (20 mg total) by mouth 2 (two) times daily. 60 tablet 0   No facility-administered medications prior to visit.      Allergies:   Brilliant blue fcf; Ciprofloxacin; Gemfibrozil; Iohexol; Ivp dye [iodinated diagnostic agents]; Metrizamide; Bactrim; and Sulfamethoxazole-trimethoprim   Social History   Socioeconomic History  . Marital status: Widowed    Spouse name: None  . Number of children: None  . Years of education: None  . Highest education level: None  Social Needs  . Financial resource strain: None  . Food insecurity - worry: None  . Food insecurity - inability: None  .  Transportation needs - medical: None  . Transportation needs - non-medical: None  Occupational History  . Occupation: [employer]  Tobacco Use  . Smoking status: Never Smoker  . Smokeless tobacco: Never Used  Substance and Sexual Activity  . Alcohol use: No  . Drug use: No  . Sexual activity: No  Other Topics Concern  . None  Social History Narrative   Pt lives with her daugher     Family History:  The patient's family history includes Hypertension in her father.   ROS:   Please see the history of present illness.    ROS All other systems reviewed and are negative.   PHYSICAL EXAM:   VS:  BP 101/65   Pulse 66   Ht  (1.651 m)  BMI 34.78 kg/m    GEN: Well nourished, well developed, in no acute distress  HEENT: normal  Neck: no JVD, carotid bruits, or masses Cardiac: Irregularly irregular; no murmurs, rubs, or gallops,no edema  Respiratory: Prominent left basilar crackles GI: soft, nontender, nondistended, + BS MS: no deformity or atrophy  Skin: warm and dry, no rash Neuro:  Alert and Oriented x 3, Strength and sensation are intact Psych: euthymic mood, full affect  Wt Readings from Last 3 Encounters:  11/03/17 209 lb (94.8 kg)  10/24/17 209 lb 3.5 oz (94.9 kg)  03/16/16 206 lb 5.6 oz (93.6 kg)      Studies/Labs Reviewed:   EKG:  EKG is ordered today.  The ekg ordered today demonstrates atrial fibrillation, T wave inversions in anterolateral leads and also inferior leads  Recent Labs: 11/03/2017: ALT 37 11/05/2017: Hemoglobin 14.2; Platelets 174 12/12/2017: BNP 228.5; BUN 22; Creatinine, Ser 1.02; Potassium 5.2; Sodium 139   Lipid Panel    Component Value Date/Time   CHOL 156 03/29/2011 0625   TRIG 123 03/29/2011 0625   HDL 52 03/29/2011 0625   CHOLHDL 3.0 03/29/2011 0625   VLDL 25 03/29/2011 0625   LDLCALC 79 03/29/2011 0625    Additional studies/ records that were reviewed today include:   Echo 10/19/2017 LV EF: 60% -   65% Study Conclusions  -  Left ventricle: The cavity size was normal. Wall thickness was   increased in a pattern of severe LVH. Systolic function was   normal. The estimated ejection fraction was in the range of 60%   to 65%. Wall motion was normal; there were no regional wall   motion abnormalities. Doppler parameters are consistent with   pseudonormal left ventricular relaxation (grade 2 diastolic   dysfunction). The E/A ratio is >1.5 and the E/e&' ratio is >15,   suggesting elevate LV filling pressure. - Ventricular septum: D-shaped septum which showed incoordinate   motion. The contour showed diastolic flattening and systolic   flattening. - Mitral valve: Calcified annulus. - Right ventricle: The cavity size was moderately dilated. - Right atrium: Moderately dilated at 25 cm2. - Tricuspid valve: There was moderate regurgitation. - Pulmonary arteries: PA peak pressure: 73 mm Hg + RAP. - Systemic veins: The IVC was not visualized.  Impressions:  - Compared to a prior echo in 2014, there is now severe LVH, the EF   is 60-65%, there is Stage 2 diastolic dysfunction with elevated   LV filling pressure. The ventricular septum is d-shaped and   demonstrates incoordinate motion, suggesting pressure and volume   overload of the RV. The RVSP is 73 mmHg + RAP (IVC not   visualized) which is moderate to more likely severe pulmonary   hypertension. There is moderate TR and moderate biatrial   enlargement.    Echo 10/19/2017 - Left ventricle: The cavity size was normal. There was moderate   concentric hypertrophy. Systolic function was vigorous. The   estimated ejection fraction was in the range of 65% to 70%. Wall   motion was normal; there were no regional wall motion   abnormalities. - Ventricular septum: The contour showed diastolic flattening. - Aortic valve: Trileaflet; mildly thickened, mildly calcified   leaflets. There was mild regurgitation. - Right ventricle: The cavity size was moderately dilated.  Wall   thickness was normal. Systolic function was moderately to   severely reduced. - Pulmonary arteries: Systolic pressure was severely increased. PA   peak pressure: 78 mm Hg (S).  Impressions:  -  Compared to the prior study, there has been no significant   interval change.   ASSESSMENT:    1. Chest pain, unspecified type   2. Permanent atrial fibrillation (HCC)   3. Chronic diastolic congestive heart failure (HCC)   4. Hypothyroidism, unspecified type   5. Controlled type 2 diabetes mellitus without complication, without long-term current use of insulin (HCC)   6. Other chronic pulmonary embolism without acute cor pulmonale (HCC)   7. CKD (chronic kidney disease), stage III (HCC)   8. Interstitial lung disease (HCC)   9. Pulmonary hypertension, unspecified (HCC)   10. RBBB      PLAN:  In order of problems listed above:  1. Chest pain: She has been having some chest pain with exertion.  EKG shows some T wave inversion, I reviewed the EKG with DOD Dr. Duke Salvia.  Given her advanced age, we all agree she would not be a candidate for invasive study.  Because there is no plan for invasive study, therefore I would not recommend a stress test either.  Dr. Thad Ranger recommended 15 mg daily of Imdur for antianginal purposes.  2. Permanent atrial fibrillation: Coumadin level monitored by [facility] Coumadin clinic.  She is self rate controlled.  3. Chronic diastolic heart failure: She does have crackles on physical exam in the left base of the lung, however I think it is more likely to be interstitial lung disease.  We were unable to evaluate her weight as she is wheelchair-bound.  Continue on the current dose of diuretic  4. Interstitial lung disease: No obvious exacerbation.  He has very fine crackles in the left base of the lung  5. Hypothyroidism: Managed by primary care provider.  On Synthroid.  6. DM 2: On insulin.  7. History of recurrent PE: On Coumadin    Medication  Adjustments/Labs and Tests Ordered: Current medicines are reviewed at length with the patient today.  Concerns regarding medicines are outlined above.  Medication changes, Labs and Tests ordered today are listed in the Patient Instructions below. Patient Instructions  Medication Instructions:  START Imdur 15mg  Take 1 tablet once a day   Labwork: Your physician recommends that you return for lab work in: TODAY-BMP, BNP,   Testing/Procedures: None   Follow-Up: Your physician recommends that you schedule a follow-up appointment in: 2 months with Dr Rennis Golden.  Any Other Special Instructions Will Be Listed Below (If Applicable).  If you need a refill on your cardiac medications before your next appointment, please call your pharmacy.     Ramond Dial, [place]  12/13/2017 10:38 PM    [facility] [address] [place], [place], [place]  [address] Phone: [phone]; Fax: [phone]

## 2017-12-13 ENCOUNTER — Encounter: Payer: Self-pay | Admitting: Physician Assistant

## 2017-12-13 LAB — BASIC METABOLIC PANEL
BUN/Creatinine Ratio: 22 (ref 12–28)
BUN: 22 mg/dL (ref 10–36)
CO2: 22 mmol/L (ref 20–29)
Calcium: 9 mg/dL (ref 8.7–10.3)
Chloride: 98 mmol/L (ref 96–106)
Creatinine, Ser: 1.02 mg/dL — ABNORMAL HIGH (ref 0.57–1.00)
GFR calc Af Amer: 55 mL/min/{1.73_m2} — ABNORMAL LOW (ref >59)
GFR, EST NON AFRICAN AMERICAN: 48 mL/min/{1.73_m2} — AB (ref >59)
Glucose: 376 mg/dL — ABNORMAL HIGH (ref 65–99)
POTASSIUM: 5.2 mmol/L (ref 3.5–5.2)
SODIUM: 139 mmol/L (ref 134–144)

## 2017-12-13 LAB — BRAIN NATRIURETIC PEPTIDE: BNP: 228.5 pg/mL — ABNORMAL HIGH (ref 0.0–100.0)

## 2017-12-16 NOTE — Progress Notes (Signed)
BNP level is what I expected, the crackles in her lung is not coming from fluid, but more likely interstitial lung disease which is chronic. No need to adjust diuretic.

## 2018-01-06 ENCOUNTER — Telehealth: Payer: Self-pay

## 2018-01-06 NOTE — Telephone Encounter (Signed)
recevied a call from [facility] Dr. Liliane Bade office in [place], [place] stating they have never seen this patient and want to stop receiving pt information. He has been removed from the patient chart as PCP.  In review it looks as if pt PCP is Dr. Vernia Buff C. Hines, Internal Medicine at [facility].

## 2018-01-19 ENCOUNTER — Telehealth: Payer: Self-pay | Admitting: Internal Medicine

## 2018-01-19 NOTE — Telephone Encounter (Signed)
Enrique Sack is calling  Just left the pt's home and stated pt's HR is 47 beats per min, but she doesn't have any symptoms. Just for toeay it has been running in the 60's

## 2018-01-19 NOTE — Telephone Encounter (Addendum)
Returned call to [facility] states patients HR was 47 this AM.  HR normally 60s.  BP 144/64.  Patient is asymptomatic, denies SOB, dizziness, fatigue.  They will see patient again next week but have advised family to monitor until then.   Enrique Sack states she will also call this afternoon to check in again.  Agreed to continue to monitor and call if continues or becomes symptomatic.     Routed to PA for review/further recommendations

## 2018-01-20 ENCOUNTER — Telehealth: Payer: Self-pay | Admitting: Internal Medicine

## 2018-01-20 NOTE — Telephone Encounter (Signed)
Enrique Sack called back to give an update on the patient's blood pressure and heart rate. This morning her blood pressure and heart rate were 141/68 and 47. She stated that the patient did have complaints of feeling lethargic yesterday but stated that she felt better today and was up and eating breakfast when she got there. The family has been advised to call the office if the patient starts feeling lethargic again. Patient has a follow up appointment on 02/13/18 with Dr. Rennis Golden.

## 2018-01-20 NOTE — Telephone Encounter (Signed)
Sara Dennis is calling: stating pt's vitals are as following. She was told by [facility] to give an update today.  HR 47 beats per min BP 141/68

## 2018-01-23 NOTE — Telephone Encounter (Signed)
Recommend 24 hours holter monitor. Patient has permanent atrial fibrillation and she is not on any medication that's capable to slow down her heart rate. Unless we can demonstrate she has symptomatic bradycardia, I would not recommended any additional workup besides holter monitor.

## 2018-01-24 NOTE — Telephone Encounter (Signed)
Spoke with pt dtr, aware of recommendations. The patient has trouble getting in and out so the daughter would like to wait until her follow up appointment to get the monitor. Aware the monitors are placed at the [address] office and we should be able to use an emergency slot to get the monitor for her that day if dr hilty thinks it is needed. Pt agreed with this plan.

## 2018-01-24 NOTE — Telephone Encounter (Signed)
Follow up   Daughter returning call.

## 2018-01-24 NOTE — Telephone Encounter (Signed)
Left a message to call back.

## 2018-02-06 ENCOUNTER — Encounter (HOSPITAL_COMMUNITY): Payer: Self-pay | Admitting: Family Medicine

## 2018-02-06 ENCOUNTER — Emergency Department (HOSPITAL_COMMUNITY): Payer: Medicare HMO

## 2018-02-06 ENCOUNTER — Observation Stay (HOSPITAL_COMMUNITY)
Admission: EM | Admit: 2018-02-06 | Discharge: 2018-02-07 | Disposition: A | Discharge status: Home / Self Care | Payer: Medicare HMO | Attending: Family Medicine | Admitting: Family Medicine

## 2018-02-06 ENCOUNTER — Other Ambulatory Visit: Payer: Self-pay

## 2018-02-06 DIAGNOSIS — R079 Chest pain, unspecified: Secondary | ICD-10-CM

## 2018-02-06 DIAGNOSIS — N189 Chronic kidney disease, unspecified: Secondary | ICD-10-CM | POA: Diagnosis present

## 2018-02-06 DIAGNOSIS — E1122 Type 2 diabetes mellitus with diabetic chronic kidney disease: Secondary | ICD-10-CM | POA: Diagnosis present

## 2018-02-06 DIAGNOSIS — Z86711 Personal history of pulmonary embolism: Secondary | ICD-10-CM | POA: Diagnosis present

## 2018-02-06 DIAGNOSIS — Z7901 Long term (current) use of anticoagulants: Secondary | ICD-10-CM | POA: Diagnosis not present

## 2018-02-06 DIAGNOSIS — E119 Type 2 diabetes mellitus without complications: Secondary | ICD-10-CM | POA: Diagnosis not present

## 2018-02-06 DIAGNOSIS — J449 Chronic obstructive pulmonary disease, unspecified: Secondary | ICD-10-CM

## 2018-02-06 DIAGNOSIS — J9611 Chronic respiratory failure with hypoxia: Secondary | ICD-10-CM | POA: Diagnosis not present

## 2018-02-06 DIAGNOSIS — R001 Bradycardia, unspecified: Secondary | ICD-10-CM | POA: Diagnosis present

## 2018-02-06 DIAGNOSIS — Z794 Long term (current) use of insulin: Secondary | ICD-10-CM | POA: Insufficient documentation

## 2018-02-06 DIAGNOSIS — F0390 Unspecified dementia without behavioral disturbance: Secondary | ICD-10-CM | POA: Diagnosis not present

## 2018-02-06 DIAGNOSIS — I5032 Chronic diastolic (congestive) heart failure: Secondary | ICD-10-CM | POA: Diagnosis present

## 2018-02-06 DIAGNOSIS — E039 Hypothyroidism, unspecified: Secondary | ICD-10-CM | POA: Diagnosis not present

## 2018-02-06 DIAGNOSIS — N183 Chronic kidney disease, stage 3 (moderate): Secondary | ICD-10-CM | POA: Insufficient documentation

## 2018-02-06 DIAGNOSIS — I5033 Acute on chronic diastolic (congestive) heart failure: Secondary | ICD-10-CM | POA: Diagnosis not present

## 2018-02-06 DIAGNOSIS — R0789 Other chest pain: Secondary | ICD-10-CM | POA: Diagnosis not present

## 2018-02-06 DIAGNOSIS — I4891 Unspecified atrial fibrillation: Secondary | ICD-10-CM | POA: Diagnosis present

## 2018-02-06 DIAGNOSIS — Z79899 Other long term (current) drug therapy: Secondary | ICD-10-CM | POA: Diagnosis not present

## 2018-02-06 DIAGNOSIS — I482 Chronic atrial fibrillation: Secondary | ICD-10-CM | POA: Diagnosis not present

## 2018-02-06 DIAGNOSIS — F03A0 Unspecified dementia, mild, without behavioral disturbance, psychotic disturbance, mood disturbance, and anxiety: Secondary | ICD-10-CM | POA: Diagnosis present

## 2018-02-06 HISTORY — DX: Chronic respiratory failure with hypoxia: J96.11

## 2018-02-06 LAB — BASIC METABOLIC PANEL
ANION GAP: 8 (ref 5–15)
BUN: 19 mg/dL (ref 6–20)
CHLORIDE: 102 mmol/L (ref 101–111)
CO2: 26 mmol/L (ref 22–32)
Calcium: 8.8 mg/dL — ABNORMAL LOW (ref 8.9–10.3)
Creatinine, Ser: 1.1 mg/dL — ABNORMAL HIGH (ref 0.44–1.00)
GFR calc Af Amer: 49 mL/min — ABNORMAL LOW (ref >60)
GFR calc non Af Amer: 42 mL/min — ABNORMAL LOW (ref >60)
GLUCOSE: 283 mg/dL — AB (ref 65–99)
POTASSIUM: 4.3 mmol/L (ref 3.5–5.1)
SODIUM: 136 mmol/L (ref 135–145)

## 2018-02-06 LAB — CBG MONITORING, ED
GLUCOSE-CAPILLARY: 226 mg/dL — AB (ref 65–99)
Glucose-Capillary: 264 mg/dL — ABNORMAL HIGH (ref 65–99)
Glucose-Capillary: 202 mg/dL — ABNORMAL HIGH (ref 65–99)

## 2018-02-06 LAB — PROTIME-INR
INR: 2.27
PROTHROMBIN TIME: 24.9 s — AB (ref 11.4–15.2)

## 2018-02-06 LAB — GLUCOSE, CAPILLARY
Glucose-Capillary: 122 mg/dL — ABNORMAL HIGH (ref 65–99)
Glucose-Capillary: 160 mg/dL — ABNORMAL HIGH (ref 65–99)

## 2018-02-06 LAB — I-STAT TROPONIN, ED: Troponin i, poc: 0.05 ng/mL (ref 0.00–0.08)

## 2018-02-06 LAB — CBC
HEMATOCRIT: 38.3 % (ref 36.0–46.0)
HEMOGLOBIN: 12.6 g/dL (ref 12.0–15.0)
MCH: 26.6 pg (ref 26.0–34.0)
MCHC: 32.9 g/dL (ref 30.0–36.0)
MCV: 80.8 fL (ref 78.0–100.0)
Platelets: 220 10*3/uL (ref 150–400)
RBC: 4.74 MIL/uL (ref 3.87–5.11)
RDW: 16.9 % — AB (ref 11.5–15.5)
WBC: 8.2 10*3/uL (ref 4.0–10.5)

## 2018-02-06 LAB — TROPONIN I
TROPONIN I: 0.04 ng/mL — AB (ref <0.03)
Troponin I: 0.05 ng/mL (ref <0.03)
Troponin I: 0.05 ng/mL (ref <0.03)

## 2018-02-06 LAB — BRAIN NATRIURETIC PEPTIDE: B NATRIURETIC PEPTIDE 5: 357.2 pg/mL — AB (ref 0.0–100.0)

## 2018-02-06 LAB — TSH: TSH: 3.197 u[IU]/mL (ref 0.350–4.500)

## 2018-02-06 MED ORDER — INSULIN ASPART 100 UNIT/ML ~~LOC~~ SOLN
0.0000 [IU] | Freq: Three times a day (TID) | SUBCUTANEOUS | Status: DC
  Administered 2018-02-06: 5 [IU] via SUBCUTANEOUS
  Administered 2018-02-06: 3 [IU] via SUBCUTANEOUS
  Administered 2018-02-07: 2 [IU] via SUBCUTANEOUS
  Filled 2018-02-06 (×2): qty 1

## 2018-02-06 MED ORDER — WARFARIN SODIUM 5 MG PO TABS
6.0000 mg | ORAL_TABLET | Freq: Once | ORAL | Status: DC
  Filled 2018-02-06: qty 1

## 2018-02-06 MED ORDER — DONEPEZIL HCL 5 MG PO TABS
5.0000 mg | ORAL_TABLET | Freq: Every day | ORAL | Status: DC
  Filled 2018-02-06: qty 1

## 2018-02-06 MED ORDER — HEPARIN SODIUM (PORCINE) 5000 UNIT/ML IJ SOLN: 5000.0000 [IU] | Freq: Three times a day (TID) | INTRAMUSCULAR | Status: DC

## 2018-02-06 MED ORDER — VITAMIN B-12 1000 MCG PO TABS
500.0000 ug | ORAL_TABLET | Freq: Every day | ORAL | Status: DC
  Administered 2018-02-06 – 2018-02-07 (×2): 500 ug via ORAL
  Filled 2018-02-06: qty 1
  Filled 2018-02-06 (×2): qty 0.5

## 2018-02-06 MED ORDER — ASPIRIN EC 81 MG PO TBEC
81.0000 mg | DELAYED_RELEASE_TABLET | Freq: Every day | ORAL | Status: DC
  Administered 2018-02-07: 81 mg via ORAL
  Filled 2018-02-06 (×2): qty 1

## 2018-02-06 MED ORDER — LEVOTHYROXINE SODIUM 50 MCG PO TABS
50.0000 ug | ORAL_TABLET | Freq: Every day | ORAL | Status: DC
  Administered 2018-02-06 – 2018-02-07 (×2): 50 ug via ORAL
  Filled 2018-02-06 (×3): qty 1

## 2018-02-06 MED ORDER — INSULIN GLARGINE 100 UNIT/ML ~~LOC~~ SOLN
20.0000 [IU] | Freq: Two times a day (BID) | SUBCUTANEOUS | Status: DC
  Administered 2018-02-06 – 2018-02-07 (×3): 20 [IU] via SUBCUTANEOUS
  Filled 2018-02-06 (×4): qty 0.2

## 2018-02-06 MED ORDER — ONDANSETRON HCL 4 MG/2ML IJ SOLN: 4.0000 mg | Freq: Four times a day (QID) | INTRAMUSCULAR | Status: DC | PRN

## 2018-02-06 MED ORDER — FUROSEMIDE 40 MG PO TABS
40.0000 mg | ORAL_TABLET | Freq: Every day | ORAL | Status: DC
  Administered 2018-02-06 – 2018-02-07 (×2): 40 mg via ORAL
  Filled 2018-02-06: qty 2
  Filled 2018-02-06: qty 1

## 2018-02-06 MED ORDER — ISOSORBIDE MONONITRATE ER 30 MG PO TB24
15.0000 mg | ORAL_TABLET | Freq: Every day | ORAL | Status: DC
  Administered 2018-02-06 – 2018-02-07 (×2): 15 mg via ORAL
  Filled 2018-02-06 (×2): qty 1

## 2018-02-06 MED ORDER — NITROGLYCERIN 0.4 MG SL SUBL
0.4000 mg | SUBLINGUAL_TABLET | SUBLINGUAL | Status: DC | PRN
  Administered 2018-02-06 (×3): 0.4 mg via SUBLINGUAL
  Filled 2018-02-06: qty 1

## 2018-02-06 MED ORDER — WARFARIN - PHARMACIST DOSING INPATIENT: Freq: Every day | Status: DC

## 2018-02-06 MED ORDER — FUROSEMIDE 10 MG/ML IJ SOLN
40.0000 mg | Freq: Once | INTRAMUSCULAR | Status: AC
  Administered 2018-02-06: 40 mg via INTRAVENOUS
  Filled 2018-02-06: qty 4

## 2018-02-06 MED ORDER — ATORVASTATIN CALCIUM 20 MG PO TABS
20.0000 mg | ORAL_TABLET | Freq: Every day | ORAL | Status: DC
  Filled 2018-02-06: qty 1

## 2018-02-06 MED ORDER — ACETAMINOPHEN 325 MG PO TABS: 650.0000 mg | ORAL_TABLET | ORAL | Status: DC | PRN

## 2018-02-06 MED ORDER — INSULIN ASPART 100 UNIT/ML ~~LOC~~ SOLN: 0.0000 [IU] | Freq: Every day | SUBCUTANEOUS | Status: DC

## 2018-02-06 NOTE — H&P (Signed)
History and Physical    Sara Dennis:[id number] DOB: 1922-01-01 DOA: 02/06/2018  PCP: Chrystie Nose, MD   Patient coming from: Home  Chief Complaint: Chest pain   HPI: Sara Dennis is a 84 y.o. female with medical history significant for atrial fibrillation on Coumadin, chronic kidney disease stage III, insulin dependent diabetes mellitus, chronic bradycardia, and hypothyroidism, now presenting to the emergency department for evaluation of chest pain.  Patient had reportedly been in her usual state until she was trying to transfer between wheelchair and bed at home and developed chest pain.  She was treated with 324 mg of aspirin at home and came into the ED for evaluation.  Reports that pain has decreased significantly by time of arrival in the ED, but persists at a low level.  Patient denies shortness of breath or cough and denies lower extremity tenderness.  Denies fevers, but reports chills while in the ED.  ED Course: Upon arrival to the ED, patient is found to be bradycardic in the 40s, and with vitals otherwise stable.  EKG features a atrial fibrillation with rate 44, LVH, and repolarization abnormality.  Chemistry panel is notable for glucose of 283 and creatinine 1.10.  CBC is unremarkable.  Troponin is normal and BNP is elevated to 357.  Patient remains bradycardic with stable blood pressure and slight chest discomfort.  She will be admitted to the stepdown unit for ongoing evaluation and management.  Review of Systems:  All other systems reviewed and apart from HPI, are negative.  Past Medical History:  Diagnosis Date  . Arthritis    left leg  . Bradycardia    a. 05/2015: HR 40s-50s, occ 30s at night - not clear how much symptoms were r/t this, patient declined PPM at that time.  . Chronic atrial flutter (HCC)   . Chronic diastolic CHF (congestive heart failure) (HCC)   . CKD (chronic kidney disease), stage III (HCC)   . COPD (chronic obstructive pulmonary disease) (HCC)    . Diabetes mellitus   . Dyspnea   . Glaucoma    a. with significantly decreased vision.  . Hypothyroidism   . Mild dementia   . Moderate to severe pulmonary hypertension (HCC) By echo 05/2015  . Permanent atrial fibrillation (HCC)   . Pulmonary embolus (HCC) 03-23-11   a. 1978 and 2012.  . Pulmonary fibrosis, unspecified (HCC) 10/26/2017  . RBBB     Past Surgical History:  Procedure Laterality Date  . CYSTOSCOPY W/ URETERAL STENT PLACEMENT  09/06/2011   Procedure: CYSTOSCOPY WITH RETROGRADE PYELOGRAM/URETERAL STENT PLACEMENT;  Surgeon: Valetta Fuller, MD;  Location: [facility];  Service: Urology;  Laterality: Right;  cysto double j stent placement  . EYE SURGERY    . kisney stone removal  1978   removed thru bladder  . URETEROSCOPY  09/06/2011   Procedure: URETEROSCOPY;  Surgeon: Valetta Fuller, MD;  Location: [facility];  Service: Urology;  Laterality: Right;     reports that she has never smoked. She has never used smokeless tobacco. She reports that she does not drink alcohol or use drugs.  Allergies  Allergen Reactions  . Brilliant Blue Fcf Hives  . Ciprofloxacin Itching  . Gemfibrozil Nausea And Vomiting and Other (See Comments)    Sick and give urine infection  . Iohexol Itching  . Ivp Dye [Iodinated Diagnostic Agents] Itching    itching  . Metrizamide Itching    itching  . Bactrim Itching, Nausea And Vomiting and Rash  And diarrhea  . Sulfamethoxazole-Trimethoprim Itching, Nausea And Vomiting and Rash    And diarrhea    Family History  Problem Relation Age of Onset  . Hypertension Father      Prior to Admission medications   Medication Sig Start Date End Date Taking? Authorizing Provider  acetaminophen (TYLENOL) 325 MG tablet Take 2 tablets (650 mg total) by mouth every 6 (six) hours as needed for mild pain (or Fever >/= 101). 09/10/15   Joseph Art, DO  atorvastatin (LIPITOR) 20 MG tablet Take 1 tablet (20 mg total) by mouth daily at 6 PM. 08/11/15   Hilty,  Lisette Abu, MD  cyanocobalamin 500 MCG tablet Take 1 tablet (500 mcg total) by mouth daily. 02/08/16   Catarina Hartshorn, MD  donepezil (ARICEPT) 5 MG tablet Take 5 mg by mouth daily.     [provider]  furosemide (LASIX) 40 MG tablet Take 40 mg by mouth daily. 12/09/17   [provider]  insulin glargine (LANTUS) 100 UNIT/ML injection Inject 0.3 mLs (30 Units total) into the skin 2 (two) times daily. 09/10/15   Joseph Art, DO  isosorbide mononitrate (IMDUR) 30 MG 24 hr tablet Take 0.5 tablets (15 mg total) by mouth daily. 12/12/17 08/09/18  HiltyLisette Abu, MD  levothyroxine (SYNTHROID, LEVOTHROID) 50 MCG tablet Take 50 mcg by mouth daily before breakfast.    [provider]  warfarin (COUMADIN) 4 MG tablet Take 1 tablet (4 mg total) by mouth daily at 6 PM. 10/24/17   Osvaldo Shipper, MD    Physical Exam: Vitals:   02/06/18 0026 02/06/18 0145 02/06/18 0200  BP:  (!) 150/52 (!) 138/49  Pulse:  (!) 43 (!) 43  Resp:  (!) 25 (!) 25  SpO2: 100% 99% 99%      Constitutional: NAD, calm  Eyes: PERTLA, lids and conjunctivae normal ENMT: Mucous membranes are moist. Posterior pharynx clear of any exudate or lesions.   Neck: normal, supple, no masses, no thyromegaly Respiratory: No distress, fine rales diffusely. Normal respiratory effort. No accessory muscle use.  Cardiovascular: Rate ~45 and irregular. No significant JVD. Abdomen: No distension, no tenderness, soft. Bowel sounds active.  Musculoskeletal: no clubbing / cyanosis. No joint deformity upper and lower extremities.   Skin: no significant rashes, lesions, ulcers. Diaphoretic. Neurologic: No facial asymmetry. Sensation to light touch intact. Moving all extremities.   Psychiatric: Alert and oriented to person, place, and situation. Calm and cooperative.     Labs on Admission: I have personally reviewed following labs and imaging studies  CBC: Recent Labs  Lab 02/06/18 0038  WBC 8.2  HGB 12.6  HCT 38.3    MCV 80.8  PLT 220   Basic Metabolic Panel: Recent Labs  Lab 02/06/18 0038  NA 136  K 4.3  CL 102  CO2 26  GLUCOSE 283*  BUN 19  CREATININE 1.10*  CALCIUM 8.8*   GFR: CrCl cannot be calculated (Unknown ideal weight.). Liver Function Tests: No results for input(s): AST, ALT, ALKPHOS, BILITOT, PROT, ALBUMIN in the last 168 hours. No results for input(s): LIPASE, AMYLASE in the last 168 hours. No results for input(s): AMMONIA in the last 168 hours. Coagulation Profile: No results for input(s): INR, PROTIME in the last 168 hours. Cardiac Enzymes: No results for input(s): CKTOTAL, CKMB, CKMBINDEX, TROPONINI in the last 168 hours. BNP (last 3 results) No results for input(s): PROBNP in the last 8760 hours. HbA1C: No results for input(s): HGBA1C in the last 72 hours. CBG:  No results for input(s): GLUCAP in the last 168 hours. Lipid Profile: No results for input(s): CHOL, HDL, LDLCALC, TRIG, CHOLHDL, LDLDIRECT in the last 72 hours. Thyroid Function Tests: No results for input(s): TSH, T4TOTAL, FREET4, T3FREE, THYROIDAB in the last 72 hours. Anemia Panel: No results for input(s): VITAMINB12, FOLATE, FERRITIN, TIBC, IRON, RETICCTPCT in the last 72 hours. Urine analysis:    Component Value Date/Time   COLORURINE YELLOW 11/03/2017 1451   APPEARANCEUR CLOUDY (A) 11/03/2017 1451   LABSPEC 1.010 11/03/2017 1451   PHURINE 5.0 11/03/2017 1451   GLUCOSEU NEGATIVE 11/03/2017 1451   HGBUR MODERATE (A) 11/03/2017 1451   BILIRUBINUR NEGATIVE 11/03/2017 1451   KETONESUR NEGATIVE 11/03/2017 1451   PROTEINUR 30 (A) 11/03/2017 1451   UROBILINOGEN 0.2 06/13/2015 1105   NITRITE NEGATIVE 11/03/2017 1451   LEUKOCYTESUR MODERATE (A) 11/03/2017 1451   Sepsis Labs: @LABRCNTIP (procalcitonin:4,lacticidven:4) )No results found for this or any previous visit (from the past 240 hour(s)).   Radiological Exams on Admission: Dg Chest 2 View  Result Date: 02/06/2018 CLINICAL DATA:  Chest pain,  short of breath EXAM: CHEST - 2 VIEW COMPARISON:  11/05/2017, 10/19/2017, 03/16/2016 FINDINGS: Cardiomegaly with vascular congestion. Moderate diffuse coarse pulmonary interstitial opacity may reflect chronic change with superimposed edema. Trace pleural effusion. No pneumothorax. IMPRESSION: 1. Cardiomegaly with vascular congestion 2. Diffuse increased interstitial opacity may reflect chronic changes with acute superimposed edema or inflammation. Electronically Signed   By: Jasmine Pang M.D.   On: 02/06/2018 01:12    EKG: Independently reviewed. Atrial fibrillation, rate 44, LVH with repolarization abnormality.   Assessment/Plan  1. Chest pain  - Presents with chest pain that occurred as she was trying to transfer between wheelchair and bed at home, improving by time of arrival  - Initial troponin normal, CXR with non-specific interstitial opacities, and EKG with no acute ischemic features appreciated  - Treated with ASA 324 mg  - Continue cardia monitoring, obtain serial troponin measurements, continue statin    2. Chronic diastolic CHF  - Appears well-compensated, BNP elevated higher than priors, diffuse interstitial opacities on CXR likely related to chronic ILD, possibly some edema  - Give Lasix 40 IV x1 and resume her usual oral dose in am    3. Atrial fibrillation with slow ventricular response   - In atrial fibrillation on admission with HR in 40's  - HR has been low for years per chart review  - CHADS-VASc at least 5 (age x2, gender, CHF, DM)  - Check INR and continue warfarin   4. CKD stage III  - SCr is 1.10 on admission, similar to priors  - Renally-dose medications, avoid nephrotoxins    5. Insulin-dependent DM  - A1c was 10.1% in January  - Managed at home with Lantus 30 units BID  - Check CBG's, continue Lantus with 20 units BID and start SSI with Novolog while in hospital    6. Dementia  - Mild  - Continue Aricept   7. Hypothyroidism  - Check TSH given  bradycardia  - Continue Synthroid    DVT prophylaxis: sq heparin  Code Status: DNR  Family Communication: Discussed with patient Consults called: None Admission status: Observation    Briscoe Deutscher, MD [facility] Pager [phone]  If 7PM-7AM, please contact night-coverage [email or web address] Password [facility]  02/06/2018, 3:45 AM

## 2018-02-06 NOTE — ED Notes (Signed)
Patient is resting comfortably. 

## 2018-02-06 NOTE — ED Notes (Addendum)
Pt reports "very little chest pain" refusing nitro. MD paged with update

## 2018-02-06 NOTE — ED Notes (Signed)
helpe get patient cleaned up and moved over to a hospital bed patient is now resting with call bell in reach

## 2018-02-06 NOTE — ED Notes (Signed)
Meal tray arrived; RN fed patient.

## 2018-02-06 NOTE — ED Notes (Signed)
Pt soiled and wet; RN moved to hosp bed and cleaned and changed.

## 2018-02-06 NOTE — ED Notes (Signed)
RN text paged MD about troponin and called to find out status of meal tray

## 2018-02-06 NOTE — ED Provider Notes (Signed)
[facility] EMERGENCY DEPARTMENT Provider Note   CSN: [id number] Arrival date & time: 02/06/18  0023     History   Chief Complaint Chief Complaint  Patient presents with  . Chest Pain    HPI Sara Dennis is a 84 y.o. female.  Patient is a 84 year old female with past medical history including CHF, COPD, diabetes, pulmonary hypertension, and prior pulmonary embolism.  She presents today for evaluation of chest discomfort.  This started this evening as she was moving herself from the wheelchair into bed.  She describes pain in the center of her chest radiating into her back.  She describes this as sharp in nature and initially was quite severe.  It did seem to improve somewhat while in transport by EMS without intervention.  She does describe some associated shortness of breath, but no diaphoresis or nausea.  The history is provided by the patient.  Chest Pain   This is a new problem. The current episode started 1 to 2 hours ago. The problem occurs constantly. The problem has been rapidly improving. The pain is present in the substernal region. The pain is severe. The quality of the pain is described as sharp. The pain radiates to the upper back. The symptoms are aggravated by certain positions and deep breathing. Associated symptoms include shortness of breath. Pertinent negatives include no diaphoresis, no fever, no nausea, no palpitations and no sputum production.    Past Medical History:  Diagnosis Date  . Arthritis    left leg  . Bradycardia    a. 05/2015: HR 40s-50s, occ 30s at night - not clear how much symptoms were r/t this, patient declined PPM at that time.  . Chronic atrial flutter (HCC)   . Chronic diastolic CHF (congestive heart failure) (HCC)   . CKD (chronic kidney disease), stage III (HCC)   . COPD (chronic obstructive pulmonary disease) (HCC)   . Diabetes mellitus   . Dyspnea   . Glaucoma    a. with significantly decreased vision.  .  Hypothyroidism   . Mild dementia   . Moderate to severe pulmonary hypertension (HCC) By echo 05/2015  . Permanent atrial fibrillation (HCC)   . Pulmonary embolus (HCC) 03-23-11   a. 1978 and 2012.  . Pulmonary fibrosis, unspecified (HCC) 10/26/2017  . RBBB     Patient Active Problem List   Diagnosis Date Noted  . Altered mental status 11/03/2017  . Acute CHF (congestive heart failure) (HCC) 10/20/2017  . Acute on chronic diastolic CHF (congestive heart failure) (HCC) 10/19/2017  . Acute respiratory failure with hypoxia (HCC) 10/19/2017  . Delirium 03/16/2016  . Delirium due to another medical condition, acute, mixed level of activity 03/16/2016    Class: Acute  . CKD stage 3 due to type 2 diabetes mellitus (HCC) 02/05/2016  . Atrial fibrillation (HCC) 02/05/2016  . Gout 09/10/2015  . UTI (lower urinary tract infection) 09/06/2015  . Chronic diastolic (congestive) heart failure (HCC) 09/06/2015  . Diabetes mellitus 09/06/2015  . Acute on chronic renal failure (HCC) 09/06/2015  . Warfarin-induced coagulopathy (HCC) 09/06/2015  . Hypothyroidism 09/06/2015  . Fractured medial malleolus   . Demand ischemia (HCC) 06/12/2015  . Pulmonary edema with congestive heart failure (HCC)   . Atrial flutter, unspecified   . Bradycardia 06/11/2015  . Anginal pain (HCC) 06/11/2015  . History of pulmonary embolism 06/11/2015  . Atrial flutter (HCC) 05/06/2013  . Diabetes mellitus (HCC) 05/05/2013  . Hypotension 05/04/2013  . UTI (urinary tract infection)  01/30/2013  . CAP (community acquired pneumonia) 01/30/2013  . Leukocytosis 01/30/2013  . Acute encephalopathy 01/30/2013  . CHF (congestive heart failure) (HCC) 01/30/2013  . Chronic kidney disease stage III 01/30/2013  . Ureteral calculus 09/06/2011    Past Surgical History:  Procedure Laterality Date  . CYSTOSCOPY W/ URETERAL STENT PLACEMENT  09/06/2011   Procedure: CYSTOSCOPY WITH RETROGRADE PYELOGRAM/URETERAL STENT PLACEMENT;   Surgeon: Valetta Fuller, MD;  Location: [facility];  Service: Urology;  Laterality: Right;  cysto double j stent placement  . EYE SURGERY    . kisney stone removal  1978   removed thru bladder  . URETEROSCOPY  09/06/2011   Procedure: URETEROSCOPY;  Surgeon: Valetta Fuller, MD;  Location: [facility];  Service: Urology;  Laterality: Right;     OB History   None      Home Medications    Prior to Admission medications   Medication Sig Start Date End Date Taking? Authorizing Provider  acetaminophen (TYLENOL) 325 MG tablet Take 2 tablets (650 mg total) by mouth every 6 (six) hours as needed for mild pain (or Fever >/= 101). 09/10/15   Joseph Art, DO  atorvastatin (LIPITOR) 20 MG tablet Take 1 tablet (20 mg total) by mouth daily at 6 PM. 08/11/15   Hilty, Lisette Abu, MD  cyanocobalamin 500 MCG tablet Take 1 tablet (500 mcg total) by mouth daily. 02/08/16   Catarina Hartshorn, MD  donepezil (ARICEPT) 5 MG tablet Take 5 mg by mouth daily.     [provider]  furosemide (LASIX) 40 MG tablet Take 40 mg by mouth daily. 12/09/17   [provider]  insulin glargine (LANTUS) 100 UNIT/ML injection Inject 0.3 mLs (30 Units total) into the skin 2 (two) times daily. 09/10/15   Joseph Art, DO  isosorbide mononitrate (IMDUR) 30 MG 24 hr tablet Take 0.5 tablets (15 mg total) by mouth daily. 12/12/17 08/09/18  Chrystie Nose, MD  levothyroxine (SYNTHROID, LEVOTHROID) 50 MCG tablet Take 50 mcg by mouth daily before breakfast.    [provider]  predniSONE (DELTASONE) 10 MG tablet Take 4 tablets for 3 days; Take 3 tablets for 4 days; Take 2 tablets for 3 days; Take 1 tablet for 4 days 11/06/17   Randel Pigg, Dorma Russell, MD  warfarin (COUMADIN) 4 MG tablet Take 1 tablet (4 mg total) by mouth daily at 6 PM. 10/24/17   Osvaldo Shipper, MD    Family History Family History  Problem Relation Age of Onset  . Hypertension Father     Social History Social History   Tobacco Use  . Smoking status:  Never Smoker  . Smokeless tobacco: Never Used  Substance Use Topics  . Alcohol use: No  . Drug use: No     Allergies   Brilliant blue fcf; Ciprofloxacin; Gemfibrozil; Iohexol; Ivp dye [iodinated diagnostic agents]; Metrizamide; Bactrim; and Sulfamethoxazole-trimethoprim   Review of Systems Review of Systems  Constitutional: Negative for diaphoresis and fever.  Respiratory: Positive for shortness of breath. Negative for sputum production.   Cardiovascular: Positive for chest pain. Negative for palpitations.  Gastrointestinal: Negative for nausea.  All other systems reviewed and are negative.    Physical Exam Updated Vital Signs SpO2 100%   Physical Exam  Constitutional: She is oriented to person, place, and time. She appears well-developed and well-nourished. No distress.  HENT:  Head: Normocephalic and atraumatic.  Neck: Normal range of motion. Neck supple.  Cardiovascular: Normal rate and regular rhythm. Exam reveals no gallop  and no friction rub.  No murmur heard. Pulmonary/Chest: Effort normal and breath sounds normal. No respiratory distress. She has no wheezes.  Abdominal: Soft. Bowel sounds are normal. She exhibits no distension. There is no tenderness.  Musculoskeletal: Normal range of motion.       Right lower leg: Normal. She exhibits edema.       Left lower leg: Normal. She exhibits edema.  There is 1+ edema of both lower extremities.  Neurological: She is alert and oriented to person, place, and time.  Skin: Skin is warm and dry. She is not diaphoretic.  Nursing note and vitals reviewed.    ED Treatments / Results  Labs (all labs ordered are listed, but only abnormal results are displayed) Labs Reviewed  CBC - Abnormal; Notable for the following components:      Result Value   RDW 16.9 (*)    All other components within normal limits  BASIC METABOLIC PANEL  BRAIN NATRIURETIC PEPTIDE  I-STAT TROPONIN, ED    EKG EKG  Interpretation  Date/Time:  Monday Feb 06 2018 00:35:59 EDT Ventricular Rate:  44 PR Interval:    QRS Duration: 157 QT Interval:  512 QTC Calculation: 438 R Axis:   -62 Text Interpretation:  Atrial fibrillation Right bundle branch block LVH with IVCD and secondary repol abnrm Inferior infarct, old Confirmed by Geoffery Lyons ([id number]) on 02/06/2018 12:48:41 AM   Radiology No results found.  Procedures Procedures (including critical care time)  Medications Ordered in ED Medications - No data to display   Initial Impression / Assessment and Plan / ED Course  I have reviewed the triage vital signs and the nursing notes.  Pertinent labs & imaging results that were available during my care of the patient were reviewed by me and considered in my medical decision making (see chart for details).  Patient presenting with chest discomfort starting this evening while transferring from wheelchair to bed.  The etiology of her pain is not clear at this time.  There is no evidence thus far for a cardiac etiology.  Her EKG is essentially unchanged, however does have episodes of bradycardia which have been documented in the past.  She appears to be feeling better.  Due to her advanced age, I feel as though observation for rule out of MI is indicated.  I have spoken with Dr. Antionette Char who agrees to admit.  Final Clinical Impressions(s) / ED Diagnoses   Final diagnoses:  None    ED Discharge Orders    None       Geoffery Lyons, MD 02/06/18 [phone]

## 2018-02-06 NOTE — ED Triage Notes (Signed)
Pt to ED via ems for eval of left-center cp that radiates to back, left arm and left flank that started after pt moved herself from wheelchair to bed. Initially 10/10 down to 3/10 with no intervention by EMS arrival. Pt c/o SOB and 88% on her normal 2L O2. On arrival pain 2/10. 324 ASA given PTA. DNR accompanies pt.

## 2018-02-06 NOTE — Progress Notes (Signed)
ANTICOAGULATION CONSULT NOTE - Initial Consult  Pharmacy Consult for warfarin Indication: atrial fibrillation  Allergies  Allergen Reactions  . Brilliant Blue Fcf Hives  . Ciprofloxacin Itching  . Gemfibrozil Nausea And Vomiting and Other (See Comments)    Sick and give urine infection  . Iohexol Itching  . Ivp Dye [Iodinated Diagnostic Agents] Itching    itching  . Metrizamide Itching    itching  . Bactrim Itching, Nausea And Vomiting and Rash    And diarrhea  . Sulfamethoxazole-Trimethoprim Itching, Nausea And Vomiting and Rash    And diarrhea    Patient Measurements:    Vital Signs: BP: 137/54 (05/13 0445) Pulse Rate: 57 (05/13 0445)  Labs: Recent Labs    02/06/18 0038 02/06/18 0424  HGB 12.6  --   HCT 38.3  --   PLT 220  --   LABPROT  --  24.9*  INR  --  2.27  CREATININE 1.10*  --   TROPONINI  --  0.05*    CrCl cannot be calculated (Unknown ideal weight.).   Medical History: Past Medical History:  Diagnosis Date  . Arthritis    left leg  . Bradycardia    a. 05/2015: HR 40s-50s, occ 30s at night - not clear how much symptoms were r/t this, patient declined PPM at that time.  . Chronic atrial flutter (HCC)   . Chronic diastolic CHF (congestive heart failure) (HCC)   . CKD (chronic kidney disease), stage III (HCC)   . COPD (chronic obstructive pulmonary disease) (HCC)   . Diabetes mellitus   . Dyspnea   . Glaucoma    a. with significantly decreased vision.  . Hypothyroidism   . Mild dementia   . Moderate to severe pulmonary hypertension (HCC) By echo 05/2015  . Permanent atrial fibrillation (HCC)   . Pulmonary embolus (HCC) 03-23-11   a. 1978 and 2012.  . Pulmonary fibrosis, unspecified (HCC) 10/26/2017  . RBBB     Medications:   (Not in a hospital admission)  Assessment: Sara Dennis is a 84 y.o. female with atrial fibrillation on Coumadin PTA. INR on admit 2.27, but no family present to confirm last dose. The most recent anticoagulation  visit shows her dose as 8mg  Tu/Fri, 6mg  all other days. HgB 12.6, PLT 220  Goal of Therapy:  INR 2-3 Monitor platelets by anticoagulation protocol: Yes   Plan:  Warfarin 6mg  PO x1 tonight Daily INR Monitor for s/sx of bleeding  Ruben Im, PharmD Clinical Pharmacist 02/06/2018 6:57 AM

## 2018-02-06 NOTE — Progress Notes (Signed)
NT fed pt dinner.  100% eaten

## 2018-02-06 NOTE — Progress Notes (Addendum)
PROGRESS NOTE  Sara Dennis:[id number] DOB: 10-13-1921 DOA: 02/06/2018 PCP: Chrystie Nose, MD  Brief Narrative: 84 year old woman PMH atrial fibrillation on warfarin, CKD stage III, diabetes mellitus, chronic bradycardia, hypothyroidism presented with chest pain after transferring from wheelchair to bed.  Treated with aspirin with improvement prior to presentation.  Admitted for evaluation of chest pain.  Assessment/Plan Chest pain with elevated troponin, occurred in context of transfer, comorbidities include diastolic heart failure, chronic kidney disease, atrial fibrillation --Chart review notes elevated troponin 0.07-0.0 10 on 9/14 2016, 9/15 2016, 11/03/2017, 11/04/2017.  --Currently pain-free, EKG nonacute, this taken in conjunction with history, suggests noncardiac.  Doubt ACS. --Trend troponin.  If no significant elevation and no recurrent pain, likely discharge 5/14.  COPD, chronic hypoxic respiratory failure on home oxygen, moderate to severe pulmonary hypertension, pulmonary fibrosis --Appears stable.  Atrial flutter, fibrillation, bradycardia. CHA2DS2-VASc 8. --Appears stable.  Warfarin therapeutic.  Not on any rate control agents. --We will discontinue Aricept for now given bradycardia.  PMH pulmonary embolism 1978, 2012 --Continue warfarin.  INR therapeutic.  No history to suggest acute PE.  Chronic diastolic congestive LV heart failure, chronic RV systolic dysfunction, right bundle branch block --Appears compensated.  Continue Lasix   Diabetes mellitus type 2 --Stable.  Continue sliding scale insulin, Lantus.  Warfarin  DVT prophylaxis: warfarin Code Status: full Family Communication: none Disposition Plan: return to facility    Brendia Sacks, MD  [facility] Direct contact: [phone] --Via amion app OR  --[email or web address]; password [email or web address]  7PM-7AM contact night coverage as above 02/06/2018, 10:14 AM  LOS: 0 days    Consultants:    Procedures:    Antimicrobials:    Interval history/Subjective: Feels okay now.  No chest pain now.  Breathing feels "rough".  Objective: Vitals:  Vitals:   02/06/18 0715 02/06/18 0900  BP: (!) 135/44 132/62  Pulse: (!) 45 62  Resp: (!) 21 20  SpO2: 98% 96%    Exam:  Constitutional:  . Appears calm and comfortable Respiratory:  . CTA bilaterally, no w/r/r.  . Respiratory effort normal. Cardiovascular:  . RRR, no m/r/g . 1+ bilateral LE extremity edema   Psychiatric:  . Mental status o Mood, affect appropriate  I have personally reviewed the following:   Labs:  Blood sugar stable.  Troponin 0.05  CBC unremarkable  INR 2.27  TSH within normal limits  Imaging studies:  Chest x-ray independently reviewed, when allowing for differences of technique, suspect chronic interstitial lung disease rather than any component of acute edema.  Medical tests:  EKG independently reviewed ventricular rate 44.  Atrial fibrillation.  Right bundle branch block, old.  Inferior infarct cannot be excluded, old.  Compared to previous study 11/05/2017, rhythm has changed to atrial fibrillation but other findings are chronic.  Review and summation of old records:  2D echocardiogram January 2019: LVEF 65-70%.  Normal wall motion.  RV systolic function moderately to severely reduced.  Pulmonary artery systolic pressure severely increased.  Scheduled Meds: . [START ON 02/07/2018] aspirin EC  81 mg Oral Daily  . atorvastatin  20 mg Oral q1800  . furosemide  40 mg Oral Daily  . insulin aspart  0-5 Units Subcutaneous QHS  . insulin aspart  0-9 Units Subcutaneous TID WC  . insulin glargine  20 Units Subcutaneous BID  . isosorbide mononitrate  15 mg Oral Daily  . levothyroxine  50 mcg Oral QAC breakfast  . vitamin B-12  500 mcg Oral Daily  . warfarin  6 mg  Oral ONCE-1800  . Warfarin - Pharmacist Dosing Inpatient   Does not apply q1800   Continuous  Infusions:  Principal Problem:   Chest pain Active Problems:   Chronic kidney disease stage III   Bradycardia   History of pulmonary embolism   Chronic diastolic (congestive) heart failure (HCC)   Hypothyroidism   CKD stage 3 due to type 2 diabetes mellitus (HCC)   Atrial fibrillation (HCC)   Insulin-requiring or dependent type II diabetes mellitus (HCC)   Mild dementia   COPD (chronic obstructive pulmonary disease) (HCC)   Chronic respiratory failure with hypoxia (HCC)   LOS: 0 days    Time spent 35 minutes:  830-900, 1000-1015

## 2018-02-07 DIAGNOSIS — I5032 Chronic diastolic (congestive) heart failure: Secondary | ICD-10-CM | POA: Diagnosis not present

## 2018-02-07 DIAGNOSIS — E039 Hypothyroidism, unspecified: Secondary | ICD-10-CM | POA: Diagnosis not present

## 2018-02-07 DIAGNOSIS — I482 Chronic atrial fibrillation: Secondary | ICD-10-CM | POA: Diagnosis not present

## 2018-02-07 DIAGNOSIS — R079 Chest pain, unspecified: Secondary | ICD-10-CM | POA: Diagnosis not present

## 2018-02-07 DIAGNOSIS — N183 Chronic kidney disease, stage 3 (moderate): Secondary | ICD-10-CM | POA: Diagnosis not present

## 2018-02-07 DIAGNOSIS — I5033 Acute on chronic diastolic (congestive) heart failure: Secondary | ICD-10-CM | POA: Diagnosis not present

## 2018-02-07 DIAGNOSIS — R001 Bradycardia, unspecified: Secondary | ICD-10-CM | POA: Diagnosis not present

## 2018-02-07 DIAGNOSIS — R0789 Other chest pain: Secondary | ICD-10-CM | POA: Diagnosis not present

## 2018-02-07 LAB — CBC
HCT: 37.4 % (ref 36.0–46.0)
Hemoglobin: 12.1 g/dL (ref 12.0–15.0)
MCH: 26.1 pg (ref 26.0–34.0)
MCHC: 32.4 g/dL (ref 30.0–36.0)
MCV: 80.8 fL (ref 78.0–100.0)
Platelets: 220 10*3/uL (ref 150–400)
RBC: 4.63 MIL/uL (ref 3.87–5.11)
RDW: 17.1 % — AB (ref 11.5–15.5)
WBC: 8.9 10*3/uL (ref 4.0–10.5)

## 2018-02-07 LAB — PROTIME-INR
INR: 2.27
PROTHROMBIN TIME: 24.9 s — AB (ref 11.4–15.2)

## 2018-02-07 LAB — GLUCOSE, CAPILLARY
GLUCOSE-CAPILLARY: 97 mg/dL (ref 65–99)
GLUCOSE-CAPILLARY: 176 mg/dL — AB (ref 65–99)

## 2018-02-07 MED ORDER — WARFARIN SODIUM 4 MG PO TABS: 8.0000 mg | ORAL_TABLET | Freq: Once | ORAL | Status: DC

## 2018-02-07 NOTE — Progress Notes (Signed)
ANTICOAGULATION CONSULT NOTE  Pharmacy Consult for warfarin Indication: atrial fibrillation  Allergies  Allergen Reactions  . Brilliant Blue Fcf Hives  . Ciprofloxacin Itching  . Gemfibrozil Nausea And Vomiting and Other (See Comments)    Sick and give urine infection  . Iohexol Itching  . Ivp Dye [Iodinated Diagnostic Agents] Itching    itching  . Metrizamide Itching  . Bactrim Itching, Nausea And Vomiting and Rash    And diarrhea  . Sulfamethoxazole-Trimethoprim Diarrhea, Itching, Nausea And Vomiting and Rash    Patient Measurements: Height: 5\' 5"  (165.1 cm) Weight: 221 lb 5.5 oz (100.4 kg) IBW/kg (Calculated) : 57  Vital Signs: Temp: 97.8 F (36.6 C) (05/14 0438) Temp Source: Oral (05/14 0438) BP: 145/59 (05/14 0438) Pulse Rate: 48 (05/14 0438)  Labs: Recent Labs    02/06/18 0038 02/06/18 0424 02/06/18 1052 02/06/18 1605 02/07/18 0432  HGB 12.6  --   --   --  12.1  HCT 38.3  --   --   --  37.4  PLT 220  --   --   --  220  LABPROT  --  24.9*  --   --  24.9*  INR  --  2.27  --   --  2.27  CREATININE 1.10*  --   --   --   --   TROPONINI  --  0.05* 0.05* 0.04*  --     Estimated Creatinine Clearance: 37.5 mL/min (A) (by C-G formula based on SCr of 1.1 mg/dL (H)).   Medical History: Past Medical History:  Diagnosis Date  . Arthritis    left leg  . Bradycardia    a. 05/2015: HR 40s-50s, occ 30s at night - not clear how much symptoms were r/t this, patient declined PPM at that time.  . Chronic atrial flutter (HCC)   . Chronic diastolic CHF (congestive heart failure) (HCC)   . Chronic respiratory failure with hypoxia (HCC) 02/06/2018  . CKD (chronic kidney disease), stage III (HCC)   . COPD (chronic obstructive pulmonary disease) (HCC)   . Diabetes mellitus   . Dyspnea   . Glaucoma    a. with significantly decreased vision.  . Hypothyroidism   . Mild dementia   . Moderate to severe pulmonary hypertension (HCC) By echo 05/2015  . Permanent atrial  fibrillation (HCC)   . Pulmonary embolus (HCC) 03-23-11   a. 1978 and 2012.  . Pulmonary fibrosis, unspecified (HCC) 10/26/2017  . RBBB     Medications:  Medications Prior to Admission  Medication Sig Dispense Refill Last Dose  . acetaminophen (TYLENOL) 325 MG tablet Take 2 tablets (650 mg total) by mouth every 6 (six) hours as needed for mild pain (or Fever >/= 101).   prn at prn  . atorvastatin (LIPITOR) 20 MG tablet Take 1 tablet (20 mg total) by mouth daily at 6 PM. 30 tablet 1 02/05/2018 at Unknown time  . cyanocobalamin 500 MCG tablet Take 1 tablet (500 mcg total) by mouth daily. 30 tablet 0 02/05/2018 at Unknown time  . donepezil (ARICEPT) 5 MG tablet Take 5 mg by mouth daily.    02/05/2018 at Unknown time  . furosemide (LASIX) 40 MG tablet Take 40 mg by mouth daily.  2 02/05/2018 at Unknown time  . insulin glargine (LANTUS) 100 UNIT/ML injection Inject 0.3 mLs (30 Units total) into the skin 2 (two) times daily. 10 mL 11 02/05/2018 at Unknown time  . isosorbide mononitrate (IMDUR) 30 MG 24 hr tablet Take 0.5 tablets (  15 mg total) by mouth daily. 30 tablet 3 02/05/2018 at Unknown time  . levothyroxine (SYNTHROID, LEVOTHROID) 50 MCG tablet Take 50 mcg by mouth daily before breakfast.   02/05/2018 at Unknown time  . warfarin (COUMADIN) 4 MG tablet Take 1 tablet (4 mg total) by mouth daily at 6 PM. (Patient taking differently: Take 6-8 mg by mouth See admin instructions. 4mg  by mouth daily on Mon, Wed, Thurs, Sat, Sun - 8mg  on Tues, Fri) 30 tablet 0 02/05/2018 at Unknown time    Assessment: Sara Dennis is a 84 y.o. female with atrial fibrillation on Coumadin PTA. INR on admit 2.27, but no family present to confirm last dose. The most recent anticoagulation visit shows her dose as 8mg  Tu/Fri, 6mg  all other days. Today's INR stable at 2.27. HgB 12.1, PLT 220  Will continue with regimen from recent anticoag visit.  Goal of Therapy:  INR 2-3 Monitor platelets by anticoagulation protocol: Yes    Plan:  Warfarin 8mg  PO x1 tonight Daily INR Monitor for s/sx of bleeding  Tera Mater, PharmD, [facility] Clinical Pharmacist 02/07/2018 8:55 AM

## 2018-02-07 NOTE — Care Management Note (Addendum)
Case Management Note  Patient Details  Name: Sara Dennis MRN: [id number] Date of Birth: Jun 16, 1922  Subjective/Objective: Pt presented for Chest Pain- Plan for transition home 02-07-18. PTA pt was active with [facility].                   Action/Plan: CM did call [facility] and they are aware that pt will be transitioned home today. CM did try to call the daughter to make her aware of plans. Awaiting call back. No further needs from CM at this time.   Expected Discharge Date:  02/07/18               Expected Discharge Plan:  Home w Home Health Services  In-House Referral:  NA  Discharge planning Services  CM Consult  Post Acute Care Choice:  Home Health, Resumption of Svcs/PTA Provider Choice offered to:  Patient  DME Arranged:  N/A DME Agency:  NA  HH Arranged:  RN, Disease Management, PT, OT, Nurse's Aide HH Agency:  [facility]  Status of Service:  Completed, signed off  If discussed at Long Length of Stay Meetings, dates discussed:    Additional Comments: 1115 02-07-18 Tomi Bamberger, RN,BSN [phone] CM did call daughter and Sharin Mons will be set up for 1:00 pm. No further needs from CM at this time.  Gala Lewandowsky, RN 02/07/2018, 11:01 AM

## 2018-02-07 NOTE — Progress Notes (Signed)
Msg [facility] per 2 sec pause.  Pt wakes easily

## 2018-02-07 NOTE — Plan of Care (Signed)
  Problem: Education: Goal: Ability to demonstrate management of disease process will improve Outcome: Adequate for Discharge Goal: Ability to verbalize understanding of medication therapies will improve Outcome: Adequate for Discharge   Problem: Activity: Goal: Capacity to carry out activities will improve Outcome: Adequate for Discharge   Problem: Cardiac: Goal: Ability to achieve and maintain adequate cardiopulmonary perfusion will improve Outcome: Adequate for Discharge   

## 2018-02-08 NOTE — Discharge Summary (Signed)
Physician Discharge Summary  Sara Dennis ZOX:[id number] DOB: Jul 23, 1922 DOA: 02/06/2018  PCP: Chrystie Nose, MD  Admit date: 02/06/2018 Discharge date: 02/08/2018  Admitted From: Home  Disposition:  Home   Recommendations for Outpatient Follow-up:  1. Follow up with Cardiology in 2-4 weeks   Home Health: None  Equipment/Devices: None  Discharge Condition: Fair  CODE STATUS: FULL Diet recommendation: Cardiac  Brief/Interim Summary: Sara Dennis is a 84 y.o. F with Afib on warfarin, CKD III, IDDM, wheelchair-bound, and hypothyroidism who presents with chest pain while transferring between her wheelchair and the bed.    Chest pain Unclear that this was exertional or musculoskeletal, but it seems the pain was exacerbated by movement, persisted for minutes to hours afterwards at a low level.  ECG here unremarkable.  Troponins overnight were negative.  Chest pain resolved.  She was counseled on use of nitroglycerin for angina.    Bradycardia Atrial fibrillation, permanent She had asymptomatic bradycardia while sleeping, with rates as low as 40s.  She is not on AV nodal blocking agents.  Chronic diastolic CHF She appeared euvolemic.  Chronic kidney disease stage III   Discharge Diagnoses:  Principal Problem:   Chest pain Active Problems:   Chronic kidney disease stage III   Bradycardia   History of pulmonary embolism   Chronic diastolic (congestive) heart failure (HCC)   Hypothyroidism   CKD stage 3 due to type 2 diabetes mellitus (HCC)   Atrial fibrillation (HCC)   Insulin-requiring or dependent type II diabetes mellitus (HCC)   Mild dementia   COPD (chronic obstructive pulmonary disease) (HCC)   Chronic respiratory failure with hypoxia (HCC)    Discharge Instructions  Discharge Instructions    Diet - low sodium heart healthy   Complete by:  As directed    Discharge instructions   Complete by:  As directed    From Sara Dennis: You were admitted for chest  pain.   Overnight, we watched the electrical activity in your heart overnight and this was normal.  We also checked your blood work for signs of heart muscle injury (I.e a "heart attack") and this was all normal.  So it was clear that you were not having a heart attack.   Likely your pain was from muscle strain in your rib cage (given how you have to use your arms often to move yourself around or transfer to a wheelchair), or it was stable angina, like Sara Dennis diagnosed in March.  I have asked Sara Dennis office to schedule you for a follow up appointment in 1 week to discuss increasing the Imdur dose (this is a medicine to prevent chest pain).  If it was pain from your rib cage muscles, you should rest them.  You should also try taking acetaminophen/Tylenol.   If you have not heard from them by Friday, call their office.   Increase activity slowly   Complete by:  As directed      Allergies as of 02/07/2018      Reactions   Brilliant Blue Fcf Hives   Ciprofloxacin Itching   Gemfibrozil Nausea And Vomiting, Other (See Comments)   Sick and give urine infection   Iohexol Itching   Ivp Dye [iodinated Diagnostic Agents] Itching   itching   Metrizamide Itching   Bactrim Itching, Nausea And Vomiting, Rash   And diarrhea   Sulfamethoxazole-trimethoprim Diarrhea, Itching, Nausea And Vomiting, Rash      Medication List    TAKE these medications   acetaminophen  325 MG tablet Commonly known as:  TYLENOL Take 2 tablets (650 mg total) by mouth every 6 (six) hours as needed for mild pain (or Fever >/= 101).   atorvastatin 20 MG tablet Commonly known as:  LIPITOR Take 1 tablet (20 mg total) by mouth daily at 6 PM.   donepezil 5 MG tablet Commonly known as:  ARICEPT Take 5 mg by mouth daily.   furosemide 40 MG tablet Commonly known as:  LASIX Take 40 mg by mouth daily.   insulin glargine 100 UNIT/ML injection Commonly known as:  LANTUS Inject 0.3 mLs (30 Units total) into the skin 2  (two) times daily.   isosorbide mononitrate 30 MG 24 hr tablet Commonly known as:  IMDUR Take 0.5 tablets (15 mg total) by mouth daily.   levothyroxine 50 MCG tablet Commonly known as:  SYNTHROID, LEVOTHROID Take 50 mcg by mouth daily before breakfast.   vitamin B-12 500 MCG tablet Commonly known as:  CYANOCOBALAMIN Take 1 tablet (500 mcg total) by mouth daily.   warfarin 4 MG tablet Commonly known as:  COUMADIN Take 1 tablet (4 mg total) by mouth daily at 6 PM. What changed:    how much to take  when to take this  additional instructions       Allergies  Allergen Reactions  . Brilliant Blue Fcf Hives  . Ciprofloxacin Itching  . Gemfibrozil Nausea And Vomiting and Other (See Comments)    Sick and give urine infection  . Iohexol Itching  . Ivp Dye [Iodinated Diagnostic Agents] Itching    itching  . Metrizamide Itching  . Bactrim Itching, Nausea And Vomiting and Rash    And diarrhea  . Sulfamethoxazole-Trimethoprim Diarrhea, Itching, Nausea And Vomiting and Rash    Consultations:  None   Procedures/Studies: Dg Chest 2 View  Result Date: 02/06/2018 CLINICAL DATA:  Chest pain, short of breath EXAM: CHEST - 2 VIEW COMPARISON:  11/05/2017, 10/19/2017, 03/16/2016 FINDINGS: Cardiomegaly with vascular congestion. Moderate diffuse coarse pulmonary interstitial opacity may reflect chronic change with superimposed edema. Trace pleural effusion. No pneumothorax. IMPRESSION: 1. Cardiomegaly with vascular congestion 2. Diffuse increased interstitial opacity may reflect chronic changes with acute superimposed edema or inflammation. Electronically Signed   By: Sara Dennis M.D.   On: 02/06/2018 01:12       Subjective: Feels well.  No chest pain.  Appetite good for breakfast.  No dyspnea, leg swelling orthopnea.  No dizziness.  No confusion.  Discharge Exam: Vitals:   02/07/18 0745 02/07/18 1125  BP: (!) 144/69 (!) 147/71  Pulse: (!) 54 (!) 54  Resp: 20 (!) 23  Temp:  97.6 F (36.4 C) 98 F (36.7 C)  SpO2:     Vitals:   02/07/18 0012 02/07/18 0438 02/07/18 0745 02/07/18 1125  BP: (!) 163/61 (!) 145/59 (!) 144/69 (!) 147/71  Pulse: (!) 58 (!) 48 (!) 54 (!) 54  Resp: (!) 25 15 20  (!) 23  Temp: (!) 97.5 F (36.4 C) 97.8 F (36.6 C) 97.6 F (36.4 C) 98 F (36.7 C)  TempSrc: Oral Oral Oral Oral  SpO2: 97% 96%    Weight:  100.4 kg (221 lb 5.5 oz)    Height:        General: Pt is alert, awake, not in acute distress, sitting in bed, granddaughter feeding her breakfast Cardiovascular: Normal rate, irregular, S1/S2 +, no rubs, no gallops Respiratory: CTA bilaterally, no wheezing, no rhonchi Abdominal: Soft, NT, ND, bowel sounds + Extremities: no edema, no cyanosis  The results of significant diagnostics from this hospitalization (including imaging, microbiology, ancillary and laboratory) are listed below for reference.     Microbiology: No results found for this or any previous visit (from the past 240 hour(s)).   Labs: BNP (last 3 results) Recent Labs    11/03/17 2113 12/12/17 1204 02/06/18 0038  BNP 233.0* 228.5* 357.2*   Basic Metabolic Panel: Recent Labs  Lab 02/06/18 0038  NA 136  K 4.3  CL 102  CO2 26  GLUCOSE 283*  BUN 19  CREATININE 1.10*  CALCIUM 8.8*   Liver Function Tests: No results for input(s): AST, ALT, ALKPHOS, BILITOT, PROT, ALBUMIN in the last 168 hours. No results for input(s): LIPASE, AMYLASE in the last 168 hours. No results for input(s): AMMONIA in the last 168 hours. CBC: Recent Labs  Lab 02/06/18 0038 02/07/18 0432  WBC 8.2 8.9  HGB 12.6 12.1  HCT 38.3 37.4  MCV 80.8 80.8  PLT 220 220   Cardiac Enzymes: Recent Labs  Lab 02/06/18 0424 02/06/18 1052 02/06/18 1605  TROPONINI 0.05* 0.05* 0.04*   BNP: Invalid input(s): POCBNP CBG: Recent Labs  Lab 02/06/18 1429 02/06/18 1825 02/06/18 2153 02/07/18 0759 02/07/18 1121  GLUCAP 202* 122* 160* 97 176*   D-Dimer No results for  input(s): DDIMER in the last 72 hours. Hgb A1c No results for input(s): HGBA1C in the last 72 hours. Lipid Profile No results for input(s): CHOL, HDL, LDLCALC, TRIG, CHOLHDL, LDLDIRECT in the last 72 hours. Thyroid function studies Recent Labs    02/06/18 0424  TSH 3.197   Anemia work up No results for input(s): VITAMINB12, FOLATE, FERRITIN, TIBC, IRON, RETICCTPCT in the last 72 hours. Urinalysis    Component Value Date/Time   COLORURINE YELLOW 11/03/2017 1451   APPEARANCEUR CLOUDY (A) 11/03/2017 1451   LABSPEC 1.010 11/03/2017 1451   PHURINE 5.0 11/03/2017 1451   GLUCOSEU NEGATIVE 11/03/2017 1451   HGBUR MODERATE (A) 11/03/2017 1451   BILIRUBINUR NEGATIVE 11/03/2017 1451   KETONESUR NEGATIVE 11/03/2017 1451   PROTEINUR 30 (A) 11/03/2017 1451   UROBILINOGEN 0.2 06/13/2015 1105   NITRITE NEGATIVE 11/03/2017 1451   LEUKOCYTESUR MODERATE (A) 11/03/2017 1451   Sepsis Labs Invalid input(s): PROCALCITONIN,  WBC,  LACTICIDVEN Microbiology No results found for this or any previous visit (from the past 240 hour(s)).   Time coordinating discharge: 25 minutes       SIGNED:   Alberteen Sam, MD  [facility] 02/07/2018, 10:30 AM

## 2018-02-09 ENCOUNTER — Telehealth: Payer: Self-pay | Admitting: Internal Medicine

## 2018-02-09 NOTE — Telephone Encounter (Signed)
Error no note needed °

## 2018-02-13 ENCOUNTER — Ambulatory Visit: Payer: Medicare HMO | Admitting: Internal Medicine

## 2018-02-24 ENCOUNTER — Telehealth: Payer: Self-pay | Admitting: Internal Medicine

## 2018-02-24 NOTE — Telephone Encounter (Signed)
Noted. Will wait on fax

## 2018-02-24 NOTE — Telephone Encounter (Signed)
New Message    Annice Pih called wanting to have more hours approved for home health , she will be faxing over the paperwork today

## 2018-02-24 NOTE — Telephone Encounter (Signed)
Message routed to the nurse for her information.

## 2018-03-13 ENCOUNTER — Ambulatory Visit: Payer: Medicare HMO | Admitting: Physician Assistant

## 2018-03-22 ENCOUNTER — Other Ambulatory Visit: Payer: Self-pay

## 2018-03-22 ENCOUNTER — Emergency Department (HOSPITAL_COMMUNITY): Payer: Medicare HMO

## 2018-03-22 ENCOUNTER — Encounter (HOSPITAL_COMMUNITY): Payer: Self-pay | Admitting: Emergency Medicine

## 2018-03-22 ENCOUNTER — Observation Stay (HOSPITAL_COMMUNITY)
Admission: EM | Admit: 2018-03-22 | Discharge: 2018-03-25 | Disposition: A | Discharge status: Home / Self Care | Payer: Medicare HMO | Attending: Internal Medicine | Admitting: Internal Medicine

## 2018-03-22 DIAGNOSIS — I4892 Unspecified atrial flutter: Secondary | ICD-10-CM | POA: Diagnosis not present

## 2018-03-22 DIAGNOSIS — Z8249 Family history of ischemic heart disease and other diseases of the circulatory system: Secondary | ICD-10-CM | POA: Insufficient documentation

## 2018-03-22 DIAGNOSIS — R001 Bradycardia, unspecified: Secondary | ICD-10-CM | POA: Diagnosis present

## 2018-03-22 DIAGNOSIS — I6381 Other cerebral infarction due to occlusion or stenosis of small artery: Secondary | ICD-10-CM | POA: Insufficient documentation

## 2018-03-22 DIAGNOSIS — I5033 Acute on chronic diastolic (congestive) heart failure: Secondary | ICD-10-CM | POA: Diagnosis not present

## 2018-03-22 DIAGNOSIS — J8410 Pulmonary fibrosis, unspecified: Secondary | ICD-10-CM | POA: Insufficient documentation

## 2018-03-22 DIAGNOSIS — I440 Atrioventricular block, first degree: Secondary | ICD-10-CM | POA: Diagnosis not present

## 2018-03-22 DIAGNOSIS — Z881 Allergy status to other antibiotic agents status: Secondary | ICD-10-CM | POA: Insufficient documentation

## 2018-03-22 DIAGNOSIS — Z79899 Other long term (current) drug therapy: Secondary | ICD-10-CM | POA: Insufficient documentation

## 2018-03-22 DIAGNOSIS — H409 Unspecified glaucoma: Secondary | ICD-10-CM | POA: Insufficient documentation

## 2018-03-22 DIAGNOSIS — E039 Hypothyroidism, unspecified: Secondary | ICD-10-CM | POA: Diagnosis not present

## 2018-03-22 DIAGNOSIS — E1122 Type 2 diabetes mellitus with diabetic chronic kidney disease: Secondary | ICD-10-CM | POA: Insufficient documentation

## 2018-03-22 DIAGNOSIS — Z7901 Long term (current) use of anticoagulants: Secondary | ICD-10-CM | POA: Insufficient documentation

## 2018-03-22 DIAGNOSIS — Z66 Do not resuscitate: Secondary | ICD-10-CM | POA: Insufficient documentation

## 2018-03-22 DIAGNOSIS — Z794 Long term (current) use of insulin: Secondary | ICD-10-CM | POA: Insufficient documentation

## 2018-03-22 DIAGNOSIS — R531 Weakness: Secondary | ICD-10-CM | POA: Diagnosis not present

## 2018-03-22 DIAGNOSIS — I4510 Unspecified right bundle-branch block: Secondary | ICD-10-CM | POA: Insufficient documentation

## 2018-03-22 DIAGNOSIS — I130 Hypertensive heart and chronic kidney disease with heart failure and stage 1 through stage 4 chronic kidney disease, or unspecified chronic kidney disease: Secondary | ICD-10-CM | POA: Insufficient documentation

## 2018-03-22 DIAGNOSIS — Z9889 Other specified postprocedural states: Secondary | ICD-10-CM | POA: Insufficient documentation

## 2018-03-22 DIAGNOSIS — I5032 Chronic diastolic (congestive) heart failure: Secondary | ICD-10-CM | POA: Diagnosis present

## 2018-03-22 DIAGNOSIS — I482 Chronic atrial fibrillation: Secondary | ICD-10-CM | POA: Diagnosis not present

## 2018-03-22 DIAGNOSIS — Z79890 Hormone replacement therapy (postmenopausal): Secondary | ICD-10-CM | POA: Insufficient documentation

## 2018-03-22 DIAGNOSIS — Z86711 Personal history of pulmonary embolism: Secondary | ICD-10-CM | POA: Diagnosis present

## 2018-03-22 DIAGNOSIS — R55 Syncope and collapse: Principal | ICD-10-CM | POA: Diagnosis present

## 2018-03-22 DIAGNOSIS — Z888 Allergy status to other drugs, medicaments and biological substances status: Secondary | ICD-10-CM | POA: Insufficient documentation

## 2018-03-22 DIAGNOSIS — M1712 Unilateral primary osteoarthritis, left knee: Secondary | ICD-10-CM | POA: Insufficient documentation

## 2018-03-22 DIAGNOSIS — N183 Chronic kidney disease, stage 3 (moderate): Secondary | ICD-10-CM | POA: Diagnosis not present

## 2018-03-22 DIAGNOSIS — J449 Chronic obstructive pulmonary disease, unspecified: Secondary | ICD-10-CM | POA: Diagnosis not present

## 2018-03-22 DIAGNOSIS — N390 Urinary tract infection, site not specified: Secondary | ICD-10-CM | POA: Insufficient documentation

## 2018-03-22 DIAGNOSIS — M25562 Pain in left knee: Secondary | ICD-10-CM | POA: Diagnosis not present

## 2018-03-22 DIAGNOSIS — I6529 Occlusion and stenosis of unspecified carotid artery: Secondary | ICD-10-CM | POA: Diagnosis not present

## 2018-03-22 DIAGNOSIS — I6782 Cerebral ischemia: Secondary | ICD-10-CM | POA: Diagnosis not present

## 2018-03-22 DIAGNOSIS — Z960 Presence of urogenital implants: Secondary | ICD-10-CM | POA: Insufficient documentation

## 2018-03-22 DIAGNOSIS — I272 Pulmonary hypertension, unspecified: Secondary | ICD-10-CM | POA: Diagnosis not present

## 2018-03-22 DIAGNOSIS — E119 Type 2 diabetes mellitus without complications: Secondary | ICD-10-CM

## 2018-03-22 DIAGNOSIS — F0390 Unspecified dementia without behavioral disturbance: Secondary | ICD-10-CM | POA: Diagnosis not present

## 2018-03-22 DIAGNOSIS — Z9981 Dependence on supplemental oxygen: Secondary | ICD-10-CM | POA: Insufficient documentation

## 2018-03-22 DIAGNOSIS — Z882 Allergy status to sulfonamides status: Secondary | ICD-10-CM | POA: Insufficient documentation

## 2018-03-22 DIAGNOSIS — Z91041 Radiographic dye allergy status: Secondary | ICD-10-CM | POA: Insufficient documentation

## 2018-03-22 DIAGNOSIS — R7989 Other specified abnormal findings of blood chemistry: Secondary | ICD-10-CM

## 2018-03-22 DIAGNOSIS — J9611 Chronic respiratory failure with hypoxia: Secondary | ICD-10-CM | POA: Insufficient documentation

## 2018-03-22 DIAGNOSIS — R627 Adult failure to thrive: Secondary | ICD-10-CM | POA: Insufficient documentation

## 2018-03-22 DIAGNOSIS — I4891 Unspecified atrial fibrillation: Secondary | ICD-10-CM | POA: Diagnosis present

## 2018-03-22 DIAGNOSIS — R918 Other nonspecific abnormal finding of lung field: Secondary | ICD-10-CM | POA: Diagnosis not present

## 2018-03-22 HISTORY — DX: Dependence on supplemental oxygen: Z99.81

## 2018-03-22 LAB — BASIC METABOLIC PANEL
ANION GAP: 4 — AB (ref 5–15)
Anion gap: 8 (ref 5–15)
BUN: 20 mg/dL (ref 8–23)
BUN: 18 mg/dL (ref 8–23)
CHLORIDE: 106 mmol/L (ref 98–111)
CO2: 26 mmol/L (ref 22–32)
CO2: 29 mmol/L (ref 22–32)
CREATININE: 1.08 mg/dL — AB (ref 0.44–1.00)
Calcium: 9 mg/dL (ref 8.9–10.3)
Calcium: 9.2 mg/dL (ref 8.9–10.3)
Chloride: 104 mmol/L (ref 98–111)
Creatinine, Ser: 1.01 mg/dL — ABNORMAL HIGH (ref 0.44–1.00)
GFR calc Af Amer: 50 mL/min — ABNORMAL LOW (ref >60)
GFR calc Af Amer: 54 mL/min — ABNORMAL LOW (ref >60)
GFR, EST NON AFRICAN AMERICAN: 43 mL/min — AB (ref >60)
GFR, EST NON AFRICAN AMERICAN: 46 mL/min — AB (ref >60)
GLUCOSE: 182 mg/dL — AB (ref 70–99)
Glucose, Bld: 177 mg/dL — ABNORMAL HIGH (ref 70–99)
POTASSIUM: 4.3 mmol/L (ref 3.5–5.1)
Potassium: 5.7 mmol/L — ABNORMAL HIGH (ref 3.5–5.1)
SODIUM: 138 mmol/L (ref 135–145)
Sodium: 139 mmol/L (ref 135–145)

## 2018-03-22 LAB — I-STAT CHEM 8, ED
BUN: 19 mg/dL (ref 8–23)
CALCIUM ION: 1.2 mmol/L (ref 1.15–1.40)
CREATININE: 0.9 mg/dL (ref 0.44–1.00)
Chloride: 105 mmol/L (ref 98–111)
GLUCOSE: 167 mg/dL — AB (ref 70–99)
HCT: 40 % (ref 36.0–46.0)
HEMOGLOBIN: 13.6 g/dL (ref 12.0–15.0)
Potassium: 4.2 mmol/L (ref 3.5–5.1)
Sodium: 141 mmol/L (ref 135–145)
TCO2: 27 mmol/L (ref 22–32)

## 2018-03-22 LAB — URINALYSIS, ROUTINE W REFLEX MICROSCOPIC
Bilirubin Urine: NEGATIVE
Glucose, UA: NEGATIVE mg/dL
KETONES UR: NEGATIVE mg/dL
NITRITE: NEGATIVE
PH: 6 (ref 5.0–8.0)
PROTEIN: 100 mg/dL — AB
SPECIFIC GRAVITY, URINE: 1.01 (ref 1.005–1.030)
WBC, UA: >50 WBC/hpf — ABNORMAL HIGH (ref 0–5)

## 2018-03-22 LAB — CBC
HCT: 40.3 % (ref 36.0–46.0)
Hemoglobin: 12.7 g/dL (ref 12.0–15.0)
MCH: 25.7 pg — ABNORMAL LOW (ref 26.0–34.0)
MCHC: 31.5 g/dL (ref 30.0–36.0)
MCV: 81.6 fL (ref 78.0–100.0)
PLATELETS: 203 10*3/uL (ref 150–400)
RBC: 4.94 MIL/uL (ref 3.87–5.11)
RDW: 15.2 % (ref 11.5–15.5)
WBC: 8.4 10*3/uL (ref 4.0–10.5)

## 2018-03-22 LAB — BRAIN NATRIURETIC PEPTIDE: B NATRIURETIC PEPTIDE 5: 349.2 pg/mL — AB (ref 0.0–100.0)

## 2018-03-22 LAB — MAGNESIUM: Magnesium: 2.1 mg/dL (ref 1.7–2.4)

## 2018-03-22 LAB — TROPONIN I: TROPONIN I: 0.07 ng/mL — AB (ref <0.03)

## 2018-03-22 LAB — CBG MONITORING, ED: Glucose-Capillary: 169 mg/dL — ABNORMAL HIGH (ref 70–99)

## 2018-03-22 NOTE — ED Notes (Signed)
Called lab added on trop, and BNP

## 2018-03-22 NOTE — ED Notes (Signed)
ED Provider at bedside. 

## 2018-03-22 NOTE — H&P (Addendum)
History and Physical    [name]:[id number] DOB: January 24, 1922 DOA: 03/22/2018  PCP: Chrystie Nose, MD   Patient coming from: Home  Chief Complaint: Unresponsiveness  HPI: Sara Dennis is a 84 y.o. female with medical history significant for CKD3, bradycardia, history of PE atrial fibrillation on chronic anticoagulation, COPD.  Patient was brought to the ED via EMS with reports of 5 to 10 minutes of unresponsiveness.  Patient awake and alert but does not remember the details of what happened.  Daughter provides most of the history.  Reports earlier in the day, patient complained of abdominal pain.  Later appeared a little confused when she was trying to lay on her bed to take a nap.  When she woke, she said she did not feel well , daughter put her in a wheelchair and took her to the dinning room for dinner. At the table, .daughter says patient started dry heaving, then head slumped to the side, patient had drooling from one side of her mouth, lips and fingers became blue. By the time EMS arrived, patient was back to baseline and colour had improved.  No facial asymmetry noted, speech has been at baseline.  Patient denies shortness of breath or chest pain.  Reports mild intermittent dizziness sometimes.  No fever or chills.  Patient has maintained good oral Intake.  No actual vomiting, no loose stools.  Patient reports compliance with her Lasix 40 daily, and warfarin.  At baseline patient is on 2 L O2. Recent hospital admission 5/13 - 02/07/18-chest pain thought musculokeletal, also bradycardic while sleeping as low as 40s.  ED Course: Heart rate documented 59-60, but drops as low as 38 in ED while patient as awake and alert, had no symptoms, gradually recovered to 60. blood pressure systolic 160s. Stable O2 sats >93% on home 2 L nasal cannula.  Troponin mildly elevated compared to prior at 0.07, WBC WNL 8.4, hemoglobin stable 12.7, BNP similar to prior 349.  EKG- reported by EDP-no acute  findings, LVH.  Head CT-chronic lacunar infarct.  Hospitalist was called to admit for syncope.  Review of Systems: As per HPI otherwise 10 point review of systems negative.   Past Medical History:  Diagnosis Date  . Arthritis    left leg  . Bradycardia    a. 05/2015: HR 40s-50s, occ 30s at night - not clear how much symptoms were r/t this, patient declined PPM at that time.  . Chronic atrial flutter (HCC)   . Chronic diastolic CHF (congestive heart failure) (HCC)   . Chronic respiratory failure with hypoxia (HCC) 02/06/2018  . CKD (chronic kidney disease), stage III (HCC)   . COPD (chronic obstructive pulmonary disease) (HCC)   . Diabetes mellitus   . Dyspnea   . Glaucoma    a. with significantly decreased vision.  . Hypothyroidism   . Mild dementia   . Moderate to severe pulmonary hypertension (HCC) By echo 05/2015  . Permanent atrial fibrillation (HCC)   . Pulmonary embolus (HCC) 03-23-11   a. 1978 and 2012.  . Pulmonary fibrosis, unspecified (HCC) 10/26/2017  . RBBB     Past Surgical History:  Procedure Laterality Date  . CYSTOSCOPY W/ URETERAL STENT PLACEMENT  09/06/2011   Procedure: CYSTOSCOPY WITH RETROGRADE PYELOGRAM/URETERAL STENT PLACEMENT;  Surgeon: Valetta Fuller, MD;  Location: [facility];  Service: Urology;  Laterality: Right;  cysto double j stent placement  . EYE SURGERY    . kisney stone removal  1978   removed  thru bladder  . URETEROSCOPY  09/06/2011   Procedure: URETEROSCOPY;  Surgeon: Valetta Fuller, MD;  Location: [facility];  Service: Urology;  Laterality: Right;     reports that she has never smoked. She has never used smokeless tobacco. She reports that she does not drink alcohol or use drugs.  Allergies  Allergen Reactions  . Brilliant Blue Fcf Hives  . Ciprofloxacin Itching  . Gemfibrozil Nausea And Vomiting and Other (See Comments)    Sick and give urine infection  . Iohexol Itching  . Ivp Dye [Iodinated Diagnostic Agents] Itching    itching  .  Metrizamide Itching  . Bactrim Diarrhea, Itching, Nausea And Vomiting and Rash  . Sulfamethoxazole-Trimethoprim Diarrhea, Itching, Nausea And Vomiting and Rash    Family History  Problem Relation Age of Onset  . Hypertension Father     Prior to Admission medications   Medication Sig Start Date End Date Taking? Authorizing Provider  atorvastatin (LIPITOR) 20 MG tablet Take 1 tablet (20 mg total) by mouth daily at 6 PM. 08/11/15  Yes Hilty, Lisette Abu, MD  diclofenac sodium (VOLTAREN) 1 % GEL Apply 2-4 g topically 2 (two) times daily as needed (for pain).   Yes [provider]  donepezil (ARICEPT) 5 MG tablet Take 5 mg by mouth at bedtime.    Yes [provider]  furosemide (LASIX) 40 MG tablet Take 40 mg by mouth daily. 12/09/17  Yes [provider]  insulin glargine (LANTUS) 100 UNIT/ML injection Inject 0.3 mLs (30 Units total) into the skin 2 (two) times daily. 09/10/15  Yes Marlin Canary U, DO  isosorbide mononitrate (IMDUR) 30 MG 24 hr tablet Take 0.5 tablets (15 mg total) by mouth daily. 12/12/17 08/09/18 Yes Hilty, Lisette Abu, MD  levothyroxine (SYNTHROID, LEVOTHROID) 50 MCG tablet Take 50 mcg by mouth daily before breakfast.   Yes [provider]  OXYGEN Inhale 2 L into the lungs continuous.   Yes [provider]  vitamin B-12 (CYANOCOBALAMIN) 100 MCG tablet Take 100 mcg by mouth daily. 03/02/18  Yes [provider]  warfarin (COUMADIN) 2 MG tablet Take 2 mg by mouth See admin instructions. Take 2 mg by mouth in the evening on Sun/Mon/Wed/Thurs/Sat   Yes [provider]  warfarin (COUMADIN) 4 MG tablet Take 1 tablet (4 mg total) by mouth daily at 6 PM. Patient taking differently: Take 4-8 mg by mouth See admin instructions. Take 4 mg by mouth in the evening on Sun/Mon/Wed/Thurs/Sat and 8 mg on Tues/Fri 10/24/17  Yes Osvaldo Shipper, MD  acetaminophen (TYLENOL) 325 MG tablet Take 2 tablets (650 mg total) by mouth every 6 (six) hours  as needed for mild pain (or Fever >/= 101). Patient not taking: Reported on 03/22/2018 09/10/15   Joseph Art, DO  cyanocobalamin 500 MCG tablet Take 1 tablet (500 mcg total) by mouth daily. Patient not taking: Reported on 03/22/2018 02/08/16   Catarina Hartshorn, MD    Physical Exam: Vitals:   03/22/18 2115 03/22/18 2145 03/22/18 2215 03/22/18 2245  BP: (!) 166/71 (!) 163/68 (!) 166/149 (!) 169/148  Pulse: (!) 59 (!) 57 (!) 59 (!) 59  Resp: 18 12 15 20   Temp:      TempSrc:      SpO2: 93% 95% 95% 96%  Weight:      Height:        Constitutional: NAD, calm, comfortable Vitals:   03/22/18 2115 03/22/18 2145 03/22/18 2215 03/22/18 2245  BP: (!) 166/71 (!) 163/68 Marland Kitchen)  166/149 (!) 169/148  Pulse: (!) 59 (!) 57 (!) 59 (!) 59  Resp: 18 12 15 20   Temp:      TempSrc:      SpO2: 93% 95% 95% 96%  Weight:      Height:       Eyes: PERRL, lids and conjunctivae normal ENMT: Mucous membranes are moist. Posterior pharynx clear of any exudate or lesions.Normal dentition.  Neck: normal, supple, no masses, no thyromegaly Respiratory: Bilateral basilar crackles ,no wheezing, no crackles. Normal respiratory effort. No accessory muscle use.  Cardiovascular: Bradycardic, but Regular rate and rhythm, no murmurs / rubs / gallops. No extremity edema. 2+ pedal pulses.  Abdomen: no tenderness, no masses palpated. No hepatosplenomegaly. Bowel sounds positive.  Musculoskeletal: no clubbing / cyanosis. No joint deformity upper and lower extremities. Good ROM, no contractures. Normal muscle tone. Left knee pain chronic, no swelling or redness, worse with movement. Skin: no rashes, lesions, ulcers. No induration Neurologic: CN 2-12 grossly intact. Sensation intact,. Strength 5/5 in all 4.  Psychiatric: Normal judgment and insight. Alert and oriented x 3. Normal mood.   Labs on Admission: I have personally reviewed following labs and imaging studies  CBC: Recent Labs  Lab 03/22/18 2109 03/22/18 2300  WBC 8.4   --   HGB 12.7 13.6  HCT 40.3 40.0  MCV 81.6  --   PLT 203  --    Basic Metabolic Panel: Recent Labs  Lab 03/22/18 2109 03/22/18 2300  NA 138 141  K 5.7* 4.2  CL 104 105  CO2 26  --   GLUCOSE 182* 167*  BUN 20 19  CREATININE 1.08* 0.90  CALCIUM 9.0  --    Cardiac Enzymes: Recent Labs  Lab 03/22/18 2109  TROPONINI 0.07*   CBG: Recent Labs  Lab 03/22/18 2109  GLUCAP 169*   Urine analysis:    Component Value Date/Time   COLORURINE YELLOW 11/03/2017 1451   APPEARANCEUR CLOUDY (A) 11/03/2017 1451   LABSPEC 1.010 11/03/2017 1451   PHURINE 5.0 11/03/2017 1451   GLUCOSEU NEGATIVE 11/03/2017 1451   HGBUR MODERATE (A) 11/03/2017 1451   BILIRUBINUR NEGATIVE 11/03/2017 1451   KETONESUR NEGATIVE 11/03/2017 1451   PROTEINUR 30 (A) 11/03/2017 1451   UROBILINOGEN 0.2 06/13/2015 1105   NITRITE NEGATIVE 11/03/2017 1451   LEUKOCYTESUR MODERATE (A) 11/03/2017 1451    Radiological Exams on Admission: Ct Head Wo Contrast  Result Date: 03/22/2018 CLINICAL DATA:  Syncopal episode lasting 10 minutes witnessed by family. EXAM: CT HEAD WITHOUT CONTRAST TECHNIQUE: Contiguous axial images were obtained from the base of the skull through the vertex without intravenous contrast. COMPARISON:  11/03/2017 FINDINGS: Brain: Atrophy with moderate small vessel ischemic disease of periventricular and subcortical white matter. Small lacunar infarct left thalamus, chronic in appearance. Small vessel ischemic changes of the internal capsules. No large vascular territory infarct, hemorrhage, midline shift or edema. No hydrocephalus. Midline fourth ventricle and basal cisterns without effacement. Brainstem and cerebellar hemispheres are intact. Vascular: No hyperdense vessel sign. Atherosclerosis of the carotid siphons. Skull: No skull fracture or aggressive osseous lesions. Sinuses/Orbits: Clear paranasal sinuses. Chronic hyperdense but stable appearance of the right globe possibly representing silicone.  Other: None IMPRESSION: Chronic moderate small vessel ischemia of periventricular and subcortical white matter with chronic left thalamic lacunar infarct. No acute intracranial abnormality. Electronically Signed   By: Tollie Eth M.D.   On: 03/22/2018 22:07    EKG: RBBB- old. P waves not consistently and distinctly present, but rate is regular .  QTc- 504.   Assessment/Plan Active Problems:   Bradycardia   History of pulmonary embolism   Chronic diastolic (congestive) heart failure (HCC)   Atrial fibrillation (HCC)   Insulin-requiring or dependent type II diabetes mellitus (HCC)   COPD (chronic obstructive pulmonary disease) (HCC)   Syncope  Syncope- Bradycardia in Ed to 38. Dry heaving prior to syncope. Trop- mildly elevated 0.07.  EKG conduction delay.  Not on rate limiting medications. At this time syncope likely secondary to bradycardia aggravated by vasovagal event from prior dry heaving in the setting of possible UTI.  ?prolonged QTc. Doubt PE patient on warfarin compliant. TSH 01/2018- 3.19. Mag- 2.1. -Trend troponin x2 - Recent ECHO 09/2017-EF 65 to 70%, chronically elevated pulmonary artery pressure 78.  Will hold off on repeat echo. - Tele monitoring, consider Cards eval for bradycardia -Two-view chest x-ray-vascular congestion, superimposed pneumonia not excluded. -INR check  Metabolic encephalopathy, UTI-UA suggestive.  Abdominal pain.  WBC- 8.4. Reported confusion prior to admission- resolved. Vitals stable. Last urine culture 10/2017, Klebsiella, resistant to ampicillin and nitrofurantoin. -IV ceftriaxone 1g daily - Urine culture.  Chronic diastolic CHF-chest x-ray suggests vascular congestion,  Superimpoised pneumonia not excluded. No extremity edema. Stable on home 2L o2. BNP consistent with prior- 349.  Reports compliance with Lasix. No Cough, denies SOB, afebrile, no WBC. - Will hold off on antibiotics for PNA at this time. - IV lasix 40 x 1, then cont home lasix 40 daily -  INput -output - Daily weights  Atrial fibrillation, history of PE-on anticoagulation with warfarin.  Not on rate limiting medication -INR check -Warfarin per pharmacy  DM-glucose 182. Last Hgba1c- 10, 09/2017 -Continue home Lantus at reduced dose 20 units  BID - SSi  ProLonged QTC- 504. Chronic, last EKG- 490. K- 4.2.  - MAg- 2.1 - Not on Qtc prolonging meds - Tele monitoring   DVT prophylaxis: Warfarin Code Status: DNR Family Communication: Daughter and grand son at beside Disposition Plan: 1-2 days Consults called: None, but consider cards eval for bradycardia Admission status: Obs, tele   Onnie Boer MD [facility] Pager [phone] From 6PM-2AM.  Otherwise please contact night-coverage [email or web address] Password [email or web address]  If 7PM-7AM, please contact night-coverage [email or web address] Password [facility]  03/22/2018, 11:42 PM

## 2018-03-22 NOTE — ED Provider Notes (Addendum)
Emergency Department Provider Note   I have reviewed the triage vital signs and the nursing notes.   HISTORY  Chief Complaint Loss of Consciousness   HPI Sara Dennis is a 84 y.o. female with multiple medical problems documented below the presents to the emergency department today secondary to a syncopal episode.  Patient does not remember the episode but states she does not remember have any chest pain shortness of breath before.  No headache.  No worsening lower extremity swelling.  No other abnormal events.  Her granddaughter is here but was not there when she had the event.  She states that her aunt was there and that the patient was acting confused and not like herself prior to this.  And then when she was sat down she became unresponsive and became blue around the lips and was drooling.  The last approximately 10 minutes and EMS was called. More information from the daughter the basically states patient did have some not feeling well earlier in the day.  Had 5 segments of syncope.  Did have some drooling. No other associated or modifying symptoms.    Past Medical History:  Diagnosis Date  . Arthritis    left leg  . Bradycardia    a. 05/2015: HR 40s-50s, occ 30s at night - not clear how much symptoms were r/t this, patient declined PPM at that time.  . Chronic atrial flutter (HCC)   . Chronic diastolic CHF (congestive heart failure) (HCC)   . Chronic respiratory failure with hypoxia (HCC) 02/06/2018  . CKD (chronic kidney disease), stage III (HCC)   . COPD (chronic obstructive pulmonary disease) (HCC)   . Diabetes mellitus   . Dyspnea   . Glaucoma    a. with significantly decreased vision.  . Hypothyroidism   . Mild dementia   . Moderate to severe pulmonary hypertension (HCC) By echo 05/2015  . Permanent atrial fibrillation (HCC)   . Pulmonary embolus (HCC) 03-23-11   a. 1978 and 2012.  . Pulmonary fibrosis, unspecified (HCC) 10/26/2017  . RBBB     Patient Active  Problem List   Diagnosis Date Noted  . Insulin-requiring or dependent type II diabetes mellitus (HCC) 02/06/2018  . Mild dementia 02/06/2018  . COPD (chronic obstructive pulmonary disease) (HCC) 02/06/2018  . Chronic respiratory failure with hypoxia (HCC) 02/06/2018  . Acute on chronic diastolic CHF (congestive heart failure) (HCC) 10/19/2017  . CKD stage 3 due to type 2 diabetes mellitus (HCC) 02/05/2016  . Atrial fibrillation (HCC) 02/05/2016  . Chronic diastolic (congestive) heart failure (HCC) 09/06/2015  . Warfarin-induced coagulopathy (HCC) 09/06/2015  . Hypothyroidism 09/06/2015  . Bradycardia 06/11/2015  . Chest pain 06/11/2015  . History of pulmonary embolism 06/11/2015  . Chronic kidney disease stage III 01/30/2013    Past Surgical History:  Procedure Laterality Date  . CYSTOSCOPY W/ URETERAL STENT PLACEMENT  09/06/2011   Procedure: CYSTOSCOPY WITH RETROGRADE PYELOGRAM/URETERAL STENT PLACEMENT;  Surgeon: Valetta Fuller, MD;  Location: [facility];  Service: Urology;  Laterality: Right;  cysto double j stent placement  . EYE SURGERY    . kisney stone removal  1978   removed thru bladder  . URETEROSCOPY  09/06/2011   Procedure: URETEROSCOPY;  Surgeon: Valetta Fuller, MD;  Location: [facility];  Service: Urology;  Laterality: Right;    Current Outpatient Rx  . Order #: [id number] Class: Normal  . Order #: [id number] Class: Historical Med  . Order #: [id number] Class: Historical Med  . Order #: [id number] Class: Historical  Med  . Order #: [id number] Class: No Print  . Order #: [id number] Class: Normal  . Order #: [id number] Class: Historical Med  . Order #: [id number] Class: Historical Med  . Order #: [id number] Class: Historical Med  . Order #: [id number] Class: Historical Med  . Order #: [id number] Class: Print  . Order #: [id number] Class: No Print  . Order #: [id number] Class: Print    Allergies Brilliant blue fcf; Ciprofloxacin; Gemfibrozil; Iohexol; Ivp dye [iodinated diagnostic agents];  Metrizamide; Bactrim; and Sulfamethoxazole-trimethoprim  Family History  Problem Relation Age of Onset  . Hypertension Father     Social History Social History   Tobacco Use  . Smoking status: Never Smoker  . Smokeless tobacco: Never Used  Substance Use Topics  . Alcohol use: No  . Drug use: No    Review of Systems  All other systems negative except as documented in the HPI. All pertinent positives and negatives as reviewed in the HPI. ____________________________________________   PHYSICAL EXAM:  VITAL SIGNS: ED Triage Vitals  Enc Vitals Group     BP 03/22/18 2104 (!) 165/70     Pulse Rate 03/22/18 2104 60     Resp 03/22/18 2104 18     Temp 03/22/18 2104 98.3 F (36.8 C)     Temp Source 03/22/18 2104 Oral     SpO2 03/22/18 2057 96 %     Weight 03/22/18 2101 250 lb (113.4 kg)     Height 03/22/18 2101 5\' 5"  (1.651 m)     Head Circumference --      Peak Flow --      Pain Score 03/22/18 2100 0     Pain Loc --      Pain Edu? --      Excl. in GC? --     Constitutional: Alert and oriented. Well appearing and in no acute distress. Eyes: Conjunctivae are normal. PERRL. EOMI. Head: Atraumatic. Nose: No congestion/rhinnorhea. Mouth/Throat: Mucous membranes are moist.  Oropharynx non-erythematous. Neck: No stridor.  No meningeal signs.   Cardiovascular: Normal rate, regular rhythm. Good peripheral circulation. Grossly normal heart sounds.   Respiratory: Normal respiratory effort.  No retractions. Lungs CTAB. Gastrointestinal: Soft and nontender. No distention.  Musculoskeletal: No lower extremity tenderness nor edema. No gross deformities of extremities. Neurologic:  Normal speech and language. No gross focal neurologic deficits are appreciated aside from blindness.  Skin:  Skin is warm, dry and intact. No rash noted.  ____________________________________________   LABS (all labs ordered are listed, but only abnormal results are displayed)  Labs Reviewed  BASIC  METABOLIC PANEL - Abnormal; Notable for the following components:      Result Value   Potassium 5.7 (*)    Glucose, Bld 182 (*)    Creatinine, Ser 1.08 (*)    GFR calc non Af Amer 43 (*)    GFR calc Af Amer 50 (*)    All other components within normal limits  CBC - Abnormal; Notable for the following components:   MCH 25.7 (*)    All other components within normal limits  TROPONIN I - Abnormal; Notable for the following components:   Troponin I 0.07 (*)    All other components within normal limits  BRAIN NATRIURETIC PEPTIDE - Abnormal; Notable for the following components:   B Natriuretic Peptide 349.2 (*)    All other components within normal limits  CBG MONITORING, ED - Abnormal; Notable for the following components:   Glucose-Capillary 169 (*)    All other components within normal limits  I-STAT  CHEM 8, ED - Abnormal; Notable for the following components:   Glucose, Bld 167 (*)    All other components within normal limits  URINALYSIS, ROUTINE W REFLEX MICROSCOPIC  MAGNESIUM  BASIC METABOLIC PANEL  TROPONIN I  TROPONIN I   ____________________________________________  EKG  My ECG Read Indication: syncope EKG was personally contemporaneously reviewed by myself. Rate: 60 PR Interval: 280 QRS duration: 170 QT/QTC: 484/484 Axis: left EKG: unchanged from previous tracings.   ____________________________________________  RADIOLOGY  Ct Head Wo Contrast  Result Date: 03/22/2018 CLINICAL DATA:  Syncopal episode lasting 10 minutes witnessed by family. EXAM: CT HEAD WITHOUT CONTRAST TECHNIQUE: Contiguous axial images were obtained from the base of the skull through the vertex without intravenous contrast. COMPARISON:  11/03/2017 FINDINGS: Brain: Atrophy with moderate small vessel ischemic disease of periventricular and subcortical white matter. Small lacunar infarct left thalamus, chronic in appearance. Small vessel ischemic changes of the internal capsules. No large vascular  territory infarct, hemorrhage, midline shift or edema. No hydrocephalus. Midline fourth ventricle and basal cisterns without effacement. Brainstem and cerebellar hemispheres are intact. Vascular: No hyperdense vessel sign. Atherosclerosis of the carotid siphons. Skull: No skull fracture or aggressive osseous lesions. Sinuses/Orbits: Clear paranasal sinuses. Chronic hyperdense but stable appearance of the right globe possibly representing silicone. Other: None IMPRESSION: Chronic moderate small vessel ischemia of periventricular and subcortical white matter with chronic left thalamic lacunar infarct. No acute intracranial abnormality. Electronically Signed   By: Tollie Eth M.D.   On: 03/22/2018 22:07    ____________________________________________   PROCEDURES  Procedure(s) performed:   Procedures   ____________________________________________   INITIAL IMPRESSION / ASSESSMENT AND PLAN / ED COURSE  We will garner more information from the daughter when she arrived however at this time will initiate basic syncopal work-up with lean towards cardiac causes with her history. Suspect she'll need admission unless family thinks otherwise. Having some episodes of bradycardia in the 30s.  Initial potassium was 5.7 but hemolyzed to recheck was 4.2.  So awaiting urinalysis.  Head CT was okay the rest of her work-up was unremarkable this time will admit for cardiac monitoring.   Pertinent labs & imaging results that were available during my care of the patient were reviewed by me and considered in my medical decision making (see chart for details).  ____________________________________________  FINAL CLINICAL IMPRESSION(S) / ED DIAGNOSES  Final diagnoses:  Syncope and collapse     MEDICATIONS GIVEN DURING THIS VISIT:  Medications - No data to display   NEW OUTPATIENT MEDICATIONS STARTED DURING THIS VISIT:  New Prescriptions   No medications on file    Note:  This note was prepared  with assistance of Dragon voice recognition software. Occasional wrong-word or sound-a-like substitutions may have occurred due to the inherent limitations of voice recognition software.   Marily Memos, MD 03/22/18 2356

## 2018-03-22 NOTE — ED Notes (Signed)
purewick applied; pt fed half [place] sandwich and apple sauce

## 2018-03-22 NOTE — ED Notes (Signed)
  Patient had bradycardic episode and dropped HR to 38.  Was in the room with patient and she was talking appropriately and HR came back up to 60 within a few seconds.  Dr. Erin Hearing notified.

## 2018-03-22 NOTE — ED Triage Notes (Signed)
Per EMS, pt from home. When pt sat down for dinner, pt had a syncopal episode that lasted about 10 minutes, witnessed by family. Pt doesn't remember any of the episode. Pt denies any injuries. Pt denies any pain or sob. Hx CHF and diabetes.   EMS VSS. BP 162/68, P 60, 96% 2L [place] baseline, CBG 162. A&Ox4. Pt is blind. Pt is a DNR. Pt baseline can use walker but usually uses wheelchair.

## 2018-03-23 ENCOUNTER — Other Ambulatory Visit: Payer: Self-pay

## 2018-03-23 ENCOUNTER — Encounter (HOSPITAL_COMMUNITY): Payer: Self-pay | Admitting: General Practice

## 2018-03-23 DIAGNOSIS — I482 Chronic atrial fibrillation: Secondary | ICD-10-CM | POA: Diagnosis not present

## 2018-03-23 DIAGNOSIS — E119 Type 2 diabetes mellitus without complications: Secondary | ICD-10-CM | POA: Diagnosis not present

## 2018-03-23 DIAGNOSIS — I5032 Chronic diastolic (congestive) heart failure: Secondary | ICD-10-CM | POA: Diagnosis not present

## 2018-03-23 DIAGNOSIS — R7989 Other specified abnormal findings of blood chemistry: Secondary | ICD-10-CM | POA: Diagnosis not present

## 2018-03-23 DIAGNOSIS — I130 Hypertensive heart and chronic kidney disease with heart failure and stage 1 through stage 4 chronic kidney disease, or unspecified chronic kidney disease: Secondary | ICD-10-CM | POA: Diagnosis not present

## 2018-03-23 DIAGNOSIS — Z794 Long term (current) use of insulin: Secondary | ICD-10-CM

## 2018-03-23 DIAGNOSIS — R001 Bradycardia, unspecified: Secondary | ICD-10-CM | POA: Diagnosis not present

## 2018-03-23 DIAGNOSIS — R55 Syncope and collapse: Secondary | ICD-10-CM | POA: Diagnosis not present

## 2018-03-23 DIAGNOSIS — I5033 Acute on chronic diastolic (congestive) heart failure: Secondary | ICD-10-CM | POA: Diagnosis not present

## 2018-03-23 LAB — TROPONIN I
TROPONIN I: 0.1 ng/mL — AB (ref <0.03)
Troponin I: 0.1 ng/mL (ref <0.03)

## 2018-03-23 LAB — PROTIME-INR
INR: 2.96
INR: 2.77
PROTHROMBIN TIME: 29 s — AB (ref 11.4–15.2)
Prothrombin Time: 30.6 seconds — ABNORMAL HIGH (ref 11.4–15.2)

## 2018-03-23 LAB — GLUCOSE, CAPILLARY
GLUCOSE-CAPILLARY: 188 mg/dL — AB (ref 70–99)
Glucose-Capillary: 176 mg/dL — ABNORMAL HIGH (ref 70–99)
Glucose-Capillary: 197 mg/dL — ABNORMAL HIGH (ref 70–99)
Glucose-Capillary: 227 mg/dL — ABNORMAL HIGH (ref 70–99)

## 2018-03-23 MED ORDER — FUROSEMIDE 40 MG PO TABS
40.0000 mg | ORAL_TABLET | Freq: Every day | ORAL | Status: DC
  Administered 2018-03-23 – 2018-03-25 (×3): 40 mg via ORAL
  Filled 2018-03-23 (×3): qty 1

## 2018-03-23 MED ORDER — ONDANSETRON HCL 4 MG/2ML IJ SOLN: 4.0000 mg | Freq: Four times a day (QID) | INTRAMUSCULAR | Status: DC | PRN

## 2018-03-23 MED ORDER — WARFARIN SODIUM 4 MG PO TABS
4.0000 mg | ORAL_TABLET | Freq: Once | ORAL | Status: AC
  Administered 2018-03-23: 4 mg via ORAL
  Filled 2018-03-23: qty 1

## 2018-03-23 MED ORDER — WARFARIN - PHARMACIST DOSING INPATIENT
Freq: Every day | Status: DC
Start: 2018-03-23 — End: 2018-03-25
  Administered 2018-03-23: 19:00:00

## 2018-03-23 MED ORDER — ISOSORBIDE MONONITRATE ER 30 MG PO TB24
15.0000 mg | ORAL_TABLET | Freq: Every day | ORAL | Status: DC
Start: 2018-03-23 — End: 2018-03-23
  Administered 2018-03-23: 15 mg via ORAL
  Filled 2018-03-23: qty 1

## 2018-03-23 MED ORDER — FUROSEMIDE 10 MG/ML IJ SOLN
40.0000 mg | Freq: Once | INTRAMUSCULAR | Status: AC
  Administered 2018-03-23: 40 mg via INTRAVENOUS
  Filled 2018-03-23: qty 4

## 2018-03-23 MED ORDER — DONEPEZIL HCL 5 MG PO TABS
5.0000 mg | ORAL_TABLET | Freq: Every day | ORAL | Status: DC
  Filled 2018-03-23: qty 1

## 2018-03-23 MED ORDER — ATORVASTATIN CALCIUM 20 MG PO TABS
20.0000 mg | ORAL_TABLET | Freq: Every day | ORAL | Status: DC
  Administered 2018-03-23 – 2018-03-24 (×2): 20 mg via ORAL
  Filled 2018-03-23 (×3): qty 1

## 2018-03-23 MED ORDER — INSULIN GLARGINE 100 UNIT/ML ~~LOC~~ SOLN
20.0000 [IU] | Freq: Two times a day (BID) | SUBCUTANEOUS | Status: DC
  Administered 2018-03-23 – 2018-03-25 (×5): 20 [IU] via SUBCUTANEOUS
  Filled 2018-03-23 (×7): qty 0.2

## 2018-03-23 MED ORDER — ACETAMINOPHEN 650 MG RE SUPP: 650.0000 mg | Freq: Four times a day (QID) | RECTAL | Status: DC | PRN

## 2018-03-23 MED ORDER — SODIUM CHLORIDE 0.9 % IV SOLN
1.0000 g | INTRAVENOUS | Status: DC
  Administered 2018-03-23 – 2018-03-25 (×3): 1 g via INTRAVENOUS
  Filled 2018-03-23 (×3): qty 10

## 2018-03-23 MED ORDER — ACETAMINOPHEN 325 MG PO TABS
650.0000 mg | ORAL_TABLET | Freq: Four times a day (QID) | ORAL | Status: DC | PRN
  Administered 2018-03-24 – 2018-03-25 (×3): 650 mg via ORAL
  Filled 2018-03-23 (×3): qty 2

## 2018-03-23 MED ORDER — TRAZODONE HCL 50 MG PO TABS
50.0000 mg | ORAL_TABLET | Freq: Every evening | ORAL | Status: DC | PRN
  Administered 2018-03-24: 50 mg via ORAL
  Filled 2018-03-23: qty 1

## 2018-03-23 MED ORDER — ONDANSETRON HCL 4 MG PO TABS: 4.0000 mg | ORAL_TABLET | Freq: Four times a day (QID) | ORAL | Status: DC | PRN

## 2018-03-23 MED ORDER — INSULIN ASPART 100 UNIT/ML ~~LOC~~ SOLN
0.0000 [IU] | Freq: Three times a day (TID) | SUBCUTANEOUS | Status: DC
  Administered 2018-03-23 (×3): 2 [IU] via SUBCUTANEOUS
  Administered 2018-03-24: 3 [IU] via SUBCUTANEOUS
  Administered 2018-03-24: 2 [IU] via SUBCUTANEOUS
  Administered 2018-03-24: 3 [IU] via SUBCUTANEOUS
  Administered 2018-03-25: 5 [IU] via SUBCUTANEOUS
  Administered 2018-03-25: 3 [IU] via SUBCUTANEOUS

## 2018-03-23 MED ORDER — LEVOTHYROXINE SODIUM 50 MCG PO TABS
50.0000 ug | ORAL_TABLET | Freq: Every day | ORAL | Status: DC
  Administered 2018-03-23 – 2018-03-25 (×3): 50 ug via ORAL
  Filled 2018-03-23 (×4): qty 1

## 2018-03-23 NOTE — Progress Notes (Signed)
ANTICOAGULATION CONSULT NOTE - Initial Consult  Pharmacy Consult for coumadin Indication: atrial fibrillation  Allergies  Allergen Reactions  . Brilliant Blue Fcf Hives  . Ciprofloxacin Itching  . Gemfibrozil Nausea And Vomiting and Other (See Comments)    Sick and give urine infection  . Iohexol Itching  . Ivp Dye [Iodinated Diagnostic Agents] Itching    itching  . Metrizamide Itching  . Bactrim Diarrhea, Itching, Nausea And Vomiting and Rash  . Sulfamethoxazole-Trimethoprim Diarrhea, Itching, Nausea And Vomiting and Rash    Patient Measurements: Height: 5\' 5"  (165.1 cm) Weight: 223 lb 6.4 oz (101.3 kg) IBW/kg (Calculated) : 57   Vital Signs: Temp: 97.7 F (36.5 C) (06/27 0102) Temp Source: Oral (06/27 0102) BP: 155/51 (06/27 0102) Pulse Rate: 58 (06/27 0102)  Labs: Recent Labs    03/22/18 2109 03/22/18 2252 03/22/18 2300  HGB 12.7  --  13.6  HCT 40.3  --  40.0  PLT 203  --   --   LABPROT  --  30.6*  --   INR  --  2.96  --   CREATININE 1.08* 1.01* 0.90  TROPONINI 0.07*  --   --     Estimated Creatinine Clearance: 46.1 mL/min (by C-G formula based on SCr of 0.9 mg/dL).   Medical History: Past Medical History:  Diagnosis Date  . Arthritis    left leg  . Bradycardia    a. 05/2015: HR 40s-50s, occ 30s at night - not clear how much symptoms were r/t this, patient declined PPM at that time.  . Chronic atrial flutter (HCC)   . Chronic diastolic CHF (congestive heart failure) (HCC)   . Chronic respiratory failure with hypoxia (HCC) 02/06/2018  . CKD (chronic kidney disease), stage III (HCC)   . COPD (chronic obstructive pulmonary disease) (HCC)   . Diabetes mellitus   . Dyspnea   . Glaucoma    a. with significantly decreased vision.  . Hypothyroidism   . Mild dementia   . Moderate to severe pulmonary hypertension (HCC) By echo 05/2015  . Permanent atrial fibrillation (HCC)   . Pulmonary embolus (HCC) 03-23-11   a. 1978 and 2012.  . Pulmonary fibrosis,  unspecified (HCC) 10/26/2017  . RBBB     Medications:  Medications Prior to Admission  Medication Sig Dispense Refill Last Dose  . atorvastatin (LIPITOR) 20 MG tablet Take 1 tablet (20 mg total) by mouth daily at 6 PM. 30 tablet 1 03/21/2018 at pm  . diclofenac sodium (VOLTAREN) 1 % GEL Apply 2-4 g topically 2 (two) times daily as needed (for pain).   Unk at Unk  . donepezil (ARICEPT) 5 MG tablet Take 5 mg by mouth at bedtime.    03/21/2018 at pm  . furosemide (LASIX) 40 MG tablet Take 40 mg by mouth daily.  2 03/22/2018 at am  . insulin glargine (LANTUS) 100 UNIT/ML injection Inject 0.3 mLs (30 Units total) into the skin 2 (two) times daily. 10 mL 11 03/22/2018 at am  . isosorbide mononitrate (IMDUR) 30 MG 24 hr tablet Take 0.5 tablets (15 mg total) by mouth daily. 30 tablet 3 03/22/2018 at am  . levothyroxine (SYNTHROID, LEVOTHROID) 50 MCG tablet Take 50 mcg by mouth daily before breakfast.   03/22/2018 at am  . OXYGEN Inhale 2 L into the lungs continuous.   Continuous at Continuous  . vitamin B-12 (CYANOCOBALAMIN) 100 MCG tablet Take 100 mcg by mouth daily.  1 03/22/2018 at Unknown time  . warfarin (COUMADIN) 2 MG tablet  Take 2 mg by mouth See admin instructions. Take 2 mg by mouth in the evening on Sun/Mon/Wed/Thurs/Sat   03/20/2018 at 1930  . warfarin (COUMADIN) 4 MG tablet Take 1 tablet (4 mg total) by mouth daily at 6 PM. (Patient taking differently: Take 4-8 mg by mouth See admin instructions. Take 4 mg by mouth in the evening on Sun/Mon/Wed/Thurs/Sat and 8 mg on Tues/Fri) 30 tablet 0 03/21/2018 at 1930  . acetaminophen (TYLENOL) 325 MG tablet Take 2 tablets (650 mg total) by mouth every 6 (six) hours as needed for mild pain (or Fever >/= 101). (Patient not taking: Reported on 03/22/2018)   Not Taking at Unknown time  . cyanocobalamin 500 MCG tablet Take 1 tablet (500 mcg total) by mouth daily. (Patient not taking: Reported on 03/22/2018) 30 tablet 0 Not Taking at Unknown time    Assessment: 84  yo lady to continue coumadin for afib.  Her admission INR is 2.96. Goal of Therapy:  INR 2-3 Monitor platelets by anticoagulation protocol: Yes   Plan:  No coumadin tonight Daily PT/INR Monitor for bleeding complications  Toniya Rozar Poteet 03/23/2018,1:15 AM

## 2018-03-23 NOTE — Plan of Care (Signed)
Care plans reviewed and patient is progressing.  

## 2018-03-23 NOTE — Progress Notes (Signed)
ANTICOAGULATION CONSULT NOTE Pharmacy Consult for coumadin Indication: atrial fibrillation  Allergies  Allergen Reactions  . Brilliant Blue Fcf Hives  . Ciprofloxacin Itching  . Gemfibrozil Nausea And Vomiting and Other (See Comments)    Sick and give urine infection  . Iohexol Itching  . Ivp Dye [Iodinated Diagnostic Agents] Itching    itching  . Metrizamide Itching  . Bactrim Diarrhea, Itching, Nausea And Vomiting and Rash  . Sulfamethoxazole-Trimethoprim Diarrhea, Itching, Nausea And Vomiting and Rash    Patient Measurements: Height: 5\' 5"  (165.1 cm) Weight: 223 lb 6.4 oz (101.3 kg) IBW/kg (Calculated) : 57   Vital Signs: Temp: 97.5 F (36.4 C) (06/27 0507) Temp Source: Oral (06/27 0507) BP: 185/96 (06/27 0507) Pulse Rate: 47 (06/27 0507)  Labs: Recent Labs    03/22/18 2109 03/22/18 2252 03/22/18 2300 03/23/18 0310 03/23/18 0818  HGB 12.7  --  13.6  --   --   HCT 40.3  --  40.0  --   --   PLT 203  --   --   --   --   LABPROT  --  30.6*  --  29.0*  --   INR  --  2.96  --  2.77  --   CREATININE 1.08* 1.01* 0.90  --   --   TROPONINI 0.07*  --   --  0.10* 0.10*    Estimated Creatinine Clearance: 46.1 mL/min (by C-G formula based on SCr of 0.9 mg/dL).   Medical History: Past Medical History:  Diagnosis Date  . Arthritis    left leg  . Bradycardia    a. 05/2015: HR 40s-50s, occ 30s at night - not clear how much symptoms were r/t this, patient declined PPM at that time.  . Chronic atrial flutter (HCC)   . Chronic diastolic CHF (congestive heart failure) (HCC)   . Chronic respiratory failure with hypoxia (HCC) 02/06/2018  . CKD (chronic kidney disease), stage III (HCC)   . COPD (chronic obstructive pulmonary disease) (HCC)   . Diabetes mellitus   . Dyspnea   . Glaucoma    a. with significantly decreased vision.  . Hypothyroidism   . Mild dementia   . Moderate to severe pulmonary hypertension (HCC) By echo 05/2015  . Permanent atrial fibrillation (HCC)    . Pulmonary embolus (HCC) 03-23-11   a. 1978 and 2012.  . Pulmonary fibrosis, unspecified (HCC) 10/26/2017  . RBBB     Medications:  Medications Prior to Admission  Medication Sig Dispense Refill Last Dose  . atorvastatin (LIPITOR) 20 MG tablet Take 1 tablet (20 mg total) by mouth daily at 6 PM. 30 tablet 1 03/21/2018 at pm  . diclofenac sodium (VOLTAREN) 1 % GEL Apply 2-4 g topically 2 (two) times daily as needed (for pain).   Unk at Unk  . donepezil (ARICEPT) 5 MG tablet Take 5 mg by mouth at bedtime.    03/21/2018 at pm  . furosemide (LASIX) 40 MG tablet Take 40 mg by mouth daily.  2 03/22/2018 at am  . insulin glargine (LANTUS) 100 UNIT/ML injection Inject 0.3 mLs (30 Units total) into the skin 2 (two) times daily. 10 mL 11 03/22/2018 at am  . isosorbide mononitrate (IMDUR) 30 MG 24 hr tablet Take 0.5 tablets (15 mg total) by mouth daily. 30 tablet 3 03/22/2018 at am  . levothyroxine (SYNTHROID, LEVOTHROID) 50 MCG tablet Take 50 mcg by mouth daily before breakfast.   03/22/2018 at am  . OXYGEN Inhale 2 L into the  lungs continuous.   Continuous at Continuous  . vitamin B-12 (CYANOCOBALAMIN) 100 MCG tablet Take 100 mcg by mouth daily.  1 03/22/2018 at Unknown time  . warfarin (COUMADIN) 2 MG tablet Take 2 mg by mouth See admin instructions. Take 2 mg by mouth in the evening on Sun/Mon/Wed/Thurs/Sat   03/20/2018 at 1930  . warfarin (COUMADIN) 4 MG tablet Take 1 tablet (4 mg total) by mouth daily at 6 PM. (Patient taking differently: Take 4-8 mg by mouth See admin instructions. Take 4 mg by mouth in the evening on Sun/Mon/Wed/Thurs/Sat and 8 mg on Tues/Fri) 30 tablet 0 03/21/2018 at 1930  . acetaminophen (TYLENOL) 325 MG tablet Take 2 tablets (650 mg total) by mouth every 6 (six) hours as needed for mild pain (or Fever >/= 101). (Patient not taking: Reported on 03/22/2018)   Not Taking at Unknown time  . cyanocobalamin 500 MCG tablet Take 1 tablet (500 mcg total) by mouth daily. (Patient not taking:  Reported on 03/22/2018) 30 tablet 0 Not Taking at Unknown time    Assessment: 27 yoF to continue coumadin for afib.  Her admission INR is 2.96. Last reported PTA dose 6/25, and no dose was given last night. CBC stable at this time.  INR today: 2.77  PTA warfarin: 8mg  on tuesday and Friday 4mg  all other days  Goal of Therapy:  INR 2-3 Monitor platelets by anticoagulation protocol: Yes   Plan:  Warfarin 4mg  x1 tonight Daily PT/INR Monitor for bleeding complications  Daryus Sowash L Lyrik Dockstader 03/23/2018,11:13 AM

## 2018-03-23 NOTE — Progress Notes (Signed)
PROGRESS NOTE  MAECI KALBFLEISCH ZOX:[id number] DOB: [birth year] DOA: 03/22/2018 PCP: Chrystie Nose, MD  HPI/Recap of past 24 hours: Very pleasant , denies pain, no sob, no fever She denies urinary symptom  Assessment/Plan: Active Problems:   Bradycardia   History of pulmonary embolism   Chronic diastolic (congestive) heart failure (HCC)   Atrial fibrillation (HCC)   Insulin-requiring or dependent type II diabetes mellitus (HCC)   COPD (chronic obstructive pulmonary disease) (HCC)   Syncope   Syncope/bradycardia -per family patient sat down for dinner, then has a witnessed syncopal episode lasted about , patient does not remember the event. Per family patient was acting confused and not like herself prior to the episode -EDP documented bradycardia in the 30's while she is in the ED. -Ct head "Chronic moderate small vessel ischemia of periventricular and subcortical white matter with chronic left thalamic lacunar infarct. No acute intracranial abnormality." tsh unremakable, k/mag unremarkable -D/c aricept -cardiology consulted, input appreciated, currently no indication for icd, likely a vasovagal event, "patient declined icd placement even if indicated" per cardiology  imdur discontinued per cardiology recommendation to avoid venodilator effect which could lead to syncope.  UTI/encephalopathy? -currently she is pleasant, she denies urinary symptom, no fever, no leukocytosis -urine culture in process -continue empiric abx for now, follow culture result   afib  In afib with bradycardia on tele today on coumadin, INR 2.77  Chronic RBBB, Qtc prolongation K/am unremarkable, repeat ekg in am  Chronic diastolic chf:/chronic hypoxic respiratory failure on home 02 for many years per patient  Euvolemic. Continue home dose lasix  HTN;  imdur discontinued, consider lisionpril in the setting of dm2, will discuss with patient and family  Insulin dependent dm2 Continue lantus  with ssi, adjust prn  Hypothyroidism, continue synthroid, tsh 3.  FTT: with mild dementia, legally blind, lives at home with 24/7 care, use walker and has wheelchair  Code Status: DNR  Family Communication: patient   Disposition Plan: home , likely on 6/28 pending urine culture/PT eval   Consultants:  cardiology  Procedures:  none  Antibiotics:  rocephin   Objective: BP (!) 185/96 (BP Location: Left Arm)   Pulse (!) 47   Temp (!) 97.5 F (36.4 C) (Oral)   Resp (!) 24   Ht 5\' 5"  (1.651 m)   Wt 101.3 kg (223 lb 6.4 oz)   SpO2 100%   BMI 37.18 kg/m   Intake/Output Summary (Last 24 hours) at 03/23/2018 0850 Last data filed at 03/23/2018 0600 Gross per 24 hour  Intake -  Output 1000 ml  Net -1000 ml   Filed Weights   03/22/18 2101 03/23/18 0102  Weight: 113.4 kg (250 lb) 101.3 kg (223 lb 6.4 oz)    Exam: Patient is examined daily including today on 03/23/2018, exams remain the same as of yesterday except that has changed    General:  NAD, legally blind  Cardiovascular: RRR, heart rate 61  Respiratory: CTABL  Abdomen: Soft/ND/NT, positive BS  Musculoskeletal: No Edema  Neuro: alert, oriented to month, place and person, not to the year  Data Reviewed: Basic Metabolic Panel: Recent Labs  Lab 03/22/18 2109 03/22/18 2252 03/22/18 2300  NA 138 139 141  K 5.7* 4.3 4.2  CL 104 106 105  CO2 26 29  --   GLUCOSE 182* 177* 167*  BUN 20 18 19   CREATININE 1.08* 1.01* 0.90  CALCIUM 9.0 9.2  --   MG  --  2.1  --  Liver Function Tests: No results for input(s): AST, ALT, ALKPHOS, BILITOT, PROT, ALBUMIN in the last 168 hours. No results for input(s): LIPASE, AMYLASE in the last 168 hours. No results for input(s): AMMONIA in the last 168 hours. CBC: Recent Labs  Lab 03/22/18 2109 03/22/18 2300  WBC 8.4  --   HGB 12.7 13.6  HCT 40.3 40.0  MCV 81.6  --   PLT 203  --    Cardiac Enzymes:   Recent Labs  Lab 03/22/18 2109 03/23/18 0310  TROPONINI  0.07* 0.10*   BNP (last 3 results) Recent Labs    12/12/17 1204 02/06/18 0038 03/22/18 2115  BNP 228.5* 357.2* 349.2*    ProBNP (last 3 results) No results for input(s): PROBNP in the last 8760 hours.  CBG: Recent Labs  Lab 03/22/18 2109 03/23/18 0548  GLUCAP 169* 176*    No results found for this or any previous visit (from the past 240 hour(s)).   Studies: Dg Chest 2 View  Result Date: 03/23/2018 CLINICAL DATA:  84 year old female with syncope. EXAM: CHEST - 2 VIEW COMPARISON:  Chest radiograph dated 02/06/2018 FINDINGS: There is shallow inspiration with bibasilar atelectasis. There is diffuse interstitial coarsening and nodularity with more confluent areas of airspace density in the lower lung fields similar or slightly improved compared to the most recent radiograph. Findings may represent vascular congestion although superimposed pneumonia is not excluded. Clinical correlation is recommended. Small pleural effusions may be present. There is no pneumothorax. Stable cardiomegaly. No acute osseous pathology. IMPRESSION: Cardiomegaly with findings of vascular congestion. Superimposed pneumonia is not excluded. Clinical correlation is recommended. Overall interval improvement of the lower lobe airspace densities compared to the earlier radiograph. Electronically Signed   By: Elgie Collard M.D.   On: 03/23/2018 00:34   Ct Head Wo Contrast  Result Date: 03/22/2018 CLINICAL DATA:  Syncopal episode lasting 10 minutes witnessed by family. EXAM: CT HEAD WITHOUT CONTRAST TECHNIQUE: Contiguous axial images were obtained from the base of the skull through the vertex without intravenous contrast. COMPARISON:  11/03/2017 FINDINGS: Brain: Atrophy with moderate small vessel ischemic disease of periventricular and subcortical white matter. Small lacunar infarct left thalamus, chronic in appearance. Small vessel ischemic changes of the internal capsules. No large vascular territory infarct,  hemorrhage, midline shift or edema. No hydrocephalus. Midline fourth ventricle and basal cisterns without effacement. Brainstem and cerebellar hemispheres are intact. Vascular: No hyperdense vessel sign. Atherosclerosis of the carotid siphons. Skull: No skull fracture or aggressive osseous lesions. Sinuses/Orbits: Clear paranasal sinuses. Chronic hyperdense but stable appearance of the right globe possibly representing silicone. Other: None IMPRESSION: Chronic moderate small vessel ischemia of periventricular and subcortical white matter with chronic left thalamic lacunar infarct. No acute intracranial abnormality. Electronically Signed   By: Tollie Eth M.D.   On: 03/22/2018 22:07    Scheduled Meds: . atorvastatin  20 mg Oral q1800  . donepezil  5 mg Oral QHS  . furosemide  40 mg Oral Daily  . insulin aspart  0-9 Units Subcutaneous TID WC  . insulin glargine  20 Units Subcutaneous BID  . isosorbide mononitrate  15 mg Oral Daily  . levothyroxine  50 mcg Oral QAC breakfast    Continuous Infusions: . cefTRIAXone (ROCEPHIN)  IV 1 g (03/23/18 0542)     Time spent: 35 mins I have personally reviewed and interpreted on  03/23/2018 daily labs, tele strips, imagings as discussed above under date review session and assessment and plans.  I reviewed all nursing notes, pharmacy  notes, consultant notes,  vitals, pertinent old records  I have discussed plan of care as described above with RN , patient  on 03/23/2018   Albertine Grates MD, PhD  [facility] Pager [phone]. If 7PM-7AM, please contact night-coverage at [email or web address], password [facility] 03/23/2018, 8:50 AM  LOS: 0 days

## 2018-03-23 NOTE — Consult Note (Addendum)
Cardiology Consultation:   Patient ID: Sara Dennis; [id number]; 1922/07/30   Admit date: 03/22/2018 Date of Consult: 03/23/2018  Primary Care Provider: Coralie Common, MD Primary Cardiologist: Dr. Rennis Golden (last office visit 09/15/2015) Primary Electrophysiologist:  N/A  Patient Profile:   Sara Dennis is a 84 y.o. female with a hx of chronic diastolic CHF, permanent atrial fibrillation/atrial flutter on Coumadin, moderate/severe pulmonary HTN by Echo 05/2015, RBBB, CKD stage III, history of recurrent PE (1978 and 2012), and dementia who presented with bradycardia and syncope.  History of Present Illness:   Sara Dennis is a 84 y.o. female who is being seen today for the evaluation of bradycardia and syncope at the request of Dr. Roda Shutters. This is a pleasant patient of mine with permanent atrial fibrillation/flutter on warfarin, moderate to severe pulmonary hypertension, normal LV function by echo in January 2019, stage III chronic kidney disease and mild dementia, who was admitted in January with acute on chronic diastolic congestive heart failure.  At the time she was noted to have heart rate in the 30s but was thought to be asymptomatic.  Of note she has refused a pacemaker in the past.  She was seen in follow-up in March 2019 by Azalee Course, PA-C, who noted she was having chest pain.  After discussion with Dr. Duke Salvia, it was felt that her age and comorbidities such as CKD 3, precluded cardiac catheterization.  Imdur was added.  She has been on no AV nodal blocking agents.  She was again seen in the ER in May 2019 for chest pain.  No evidence for cardiac etiology was noted at the time and she ruled out for MI and was discharged.  She was noted to be bradycardic on telemetry with heart rates in the 40s but no interventions were made.  She is now admitted for unresponsiveness.  Apparently she was brought to the emergency department with 5 to 10 minutes of unresponsiveness at home.  She does not recall the  details of that event.  She said she felt unwell during the day and while sitting at the dining room table for dinner, she started dry heaving and then slumped off to her side and started drooling.  Mechanism is suggestive of a possible prodrome and may be vasovagal in etiology however given her bradycardia cardiology is asked to consult.  Troponin is noted to be mildly elevated, but flat at 0.07, 0.10, and 0.10.  BNP mildly elevated 349.  Urinalysis demonstrates large leukocytes, moderate hemoglobin, many bacteria and is nitrite negative, but suggestive of possible urinary tract infection.  Urine culture is pending.  Chest x-ray cannot exclude superimposed pneumonia however noted that lower lobe airspace opacities have improved compared to prior studies.  EKG personally reviewed shows sinus bradycardia with right bundle branch block and LVH by voltage.  Past Medical History:  Diagnosis Date  . Arthritis    left leg  . Bradycardia    a. 05/2015: HR 40s-50s, occ 30s at night - not clear how much symptoms were r/t this, patient declined PPM at that time.  . Chronic atrial flutter (HCC)   . Chronic diastolic CHF (congestive heart failure) (HCC)   . Chronic respiratory failure with hypoxia (HCC) 02/06/2018  . CKD (chronic kidney disease), stage III (HCC)   . COPD (chronic obstructive pulmonary disease) (HCC)   . Diabetes mellitus   . Dyspnea   . Glaucoma    a. with significantly decreased vision.  . Hypothyroidism   .  Mild dementia   . Moderate to severe pulmonary hypertension (HCC) By echo 05/2015  . Permanent atrial fibrillation (HCC)   . Pulmonary embolus (HCC) 03-23-11   a. 1978 and 2012.  . Pulmonary fibrosis, unspecified (HCC) 10/26/2017  . RBBB     Past Surgical History:  Procedure Laterality Date  . CYSTOSCOPY W/ URETERAL STENT PLACEMENT  09/06/2011   Procedure: CYSTOSCOPY WITH RETROGRADE PYELOGRAM/URETERAL STENT PLACEMENT;  Surgeon: Valetta Fuller, MD;  Location: [facility];  Service:  Urology;  Laterality: Right;  cysto double j stent placement  . EYE SURGERY    . kisney stone removal  1978   removed thru bladder  . URETEROSCOPY  09/06/2011   Procedure: URETEROSCOPY;  Surgeon: Valetta Fuller, MD;  Location: [facility];  Service: Urology;  Laterality: Right;     Prior to Admission medications   Medication Sig Start Date End Date Taking? Authorizing Provider  atorvastatin (LIPITOR) 20 MG tablet Take 1 tablet (20 mg total) by mouth daily at 6 PM. 08/11/15  Yes Chrles Selley, Lisette Abu, MD  donepezil (ARICEPT) 5 MG tablet Take 5 mg by mouth at bedtime.    Yes [provider]  furosemide (LASIX) 40 MG tablet Take 1 tablet (40 mg total) by mouth daily. 03/17/16  Yes Marinda Elk, MD  insulin glargine (LANTUS) 100 UNIT/ML injection Inject 0.3 mLs (30 Units total) into the skin 2 (two) times daily. 09/10/15  Yes Vann, Selinda Orion, DO  levothyroxine (SYNTHROID, LEVOTHROID) 50 MCG tablet Take 50 mcg by mouth daily before breakfast.   Yes [provider]  warfarin (COUMADIN) 4 MG tablet Take 2 tablets (8 mg total) by mouth daily. On 02/09/16 and 02/10/16 and then 4mg  daily starting 02/11/16 or as instructed by coumadin clinic Patient taking differently: Take 6 mg by mouth daily.  02/09/16  Yes Tat, Onalee Hua, MD  acetaminophen (TYLENOL) 325 MG tablet Take 2 tablets (650 mg total) by mouth every 6 (six) hours as needed for mild pain (or Fever >/= 101). 09/10/15   Joseph Art, DO  beta carotene w/minerals (OCUVITE) tablet Take 1 tablet by mouth daily.    [provider]  cyanocobalamin 500 MCG tablet Take 1 tablet (500 mcg total) by mouth daily. 02/08/16   Catarina Hartshorn, MD  traMADol (ULTRAM) 50 MG tablet Take 50 mg by mouth every 6 (six) hours as needed for moderate pain.     [provider]    Inpatient Medications: Scheduled Meds: . atorvastatin  20 mg Oral q1800  . donepezil  5 mg Oral QHS  . furosemide  40 mg Oral Daily  . insulin aspart  0-9 Units  Subcutaneous TID WC  . insulin glargine  20 Units Subcutaneous BID  . isosorbide mononitrate  15 mg Oral Daily  . levothyroxine  50 mcg Oral QAC breakfast   Continuous Infusions: . cefTRIAXone (ROCEPHIN)  IV 1 g (03/23/18 0542)   PRN Meds: acetaminophen **OR** acetaminophen, ondansetron **OR** ondansetron (ZOFRAN) IV, traZODone  Allergies:    Allergies  Allergen Reactions  . Brilliant Blue Fcf Hives  . Ciprofloxacin Itching  . Gemfibrozil Nausea And Vomiting and Other (See Comments)    Sick and give urine infection  . Iohexol Itching  . Ivp Dye [Iodinated Diagnostic Agents] Itching    itching  . Metrizamide Itching  . Bactrim Diarrhea, Itching, Nausea And Vomiting and Rash  . Sulfamethoxazole-Trimethoprim Diarrhea, Itching, Nausea And Vomiting and Rash    Social History:   Social History  Socioeconomic History  . Marital status: Widowed    Spouse name: Not on file  . Number of children: Not on file  . Years of education: Not on file  . Highest education level: Not on file  Occupational History  . Occupation: [employer]er, production  . Financial resource strain: Not on file  . Food insecurity:    Worry: Not on file    Inability: Not on file  . Transportation needs:    Medical: Not on file    Non-medical: Not on file  Tobacco Use  . Smoking status: Never Smoker  . Smokeless tobacco: Never Used  Substance and Sexual Activity  . Alcohol use: No  . Drug use: No  . Sexual activity: Never  Lifestyle  . Physical activity:    Days per week: Not on file    Minutes per session: Not on file  . Stress: Not on file  Relationships  . Social connections:    Talks on phone: Not on file    Gets together: Not on file    Attends religious service: Not on file    Active member of club or organization: Not on file    Attends meetings of clubs or organizations: Not on file    Relationship status: Not on file  . Intimate partner violence:    Fear of current or ex partner: Not  on file    Emotionally abused: Not on file    Physically abused: Not on file    Forced sexual activity: Not on file  Other Topics Concern  . Not on file  Social History Narrative   Pt lives with her daugher    Family History:   Family History  Problem Relation Age of Onset  . Hypertension Father    Family Status:  Family Status  Relation Name Status  . Mother  Deceased  . Father  Deceased  . Daughter  Alive    ROS:  Pertinent items noted in HPI and remainder of comprehensive ROS otherwise negative.   Physical Exam/Data:   Vitals:   03/22/18 2330 03/23/18 0000 03/23/18 0102 03/23/18 0507  BP: (!) 167/102 (!) 181/72 (!) 155/51 (!) 185/96  Pulse: (!) 58  (!) 58 (!) 47  Resp:   18 (!) 24  Temp:   97.7 F (36.5 C) (!) 97.5 F (36.4 C)  TempSrc:   Oral Oral  SpO2: 96%  94% 100%  Weight:   223 lb 6.4 oz (101.3 kg)   Height:    (1.651 m)     Intake/Output Summary (Last 24 hours) at 03/23/2018 0954 Last data filed at 03/23/2018 0910 Gross per 24 hour  Intake 240 ml  Output 2000 ml  Net -1760 ml   Filed Weights   03/22/18 2101 03/23/18 0102  Weight: 250 lb (113.4 kg) 223 lb 6.4 oz (101.3 kg)   Body mass index is 37.18 kg/m.   General appearance: alert, no distress and patient is blind Neck: no carotid bruit, no JVD and thyroid not enlarged, symmetric, no tenderness/mass/nodules Lungs: diminished breath sounds bibasilar Heart: irregularly irregular bradycardia Abdomen: soft, non-tender; bowel sounds normal; no masses,  no organomegaly Extremities: extremities normal, atraumatic, no cyanosis or edema Pulses: 2+ and symmetric Skin: Skin color, texture, turgor normal. No rashes or lesions Neurologic: Grossly normal Psych: Pleasant  Relevant CV Studies:  ECHO 10/19/2017: Study Conclusions: - Left ventricle: The cavity size was normal. There was moderate   concentric hypertrophy. Systolic function was vigorous. The  estimated ejection fraction was in the  range of 65% to 70%. Wall   motion was normal; there were no regional wall motion   abnormalities. - Ventricular septum: The contour showed diastolic flattening. - Aortic valve: Trileaflet; mildly thickened, mildly calcified   leaflets. There was mild regurgitation. - Right ventricle: The cavity size was moderately dilated. Wall   thickness was normal. Systolic function was moderately to   severely reduced. - Pulmonary arteries: Systolic pressure was severely increased. PA   peak pressure: 78 mm Hg (S). Impressions: - Compared to the prior study, there has been no significant   interval change.  ECHO 06/02/2015: Study Conclusions: - Left ventricle: The cavity size was normal. Wall thickness was   increased in a pattern of severe LVH. Systolic function was   normal. The estimated ejection fraction was in the range of 60%   to 65%. Wall motion was normal; there were no regional wall   motion abnormalities. Doppler parameters are consistent with   pseudonormal left ventricular relaxation (grade 2 diastolic   dysfunction). The E/A ratio is >1.5 and the E/e&' ratio is >15,   suggesting elevate LV filling pressure. - Ventricular septum: D-shaped septum which showed incoordinate   motion. The contour showed diastolic flattening and systolic   flattening. - Mitral valve: Calcified annulus. - Right ventricle: The cavity size was moderately dilated. - Right atrium: Moderately dilated at 25 cm2. - Tricuspid valve: There was moderate regurgitation. - Pulmonary arteries: PA peak pressure: 73 mm Hg + RAP. - Systemic veins: The IVC was not visualized. Impressions: - Compared to a prior echo in 2014, there is now severe LVH, the EF   is 60-65%, there is Stage 2 diastolic dysfunction with elevated   LV filling pressure. The ventricular septum is d-shaped and   demonstrates incoordinate motion, suggesting pressure and volume   overload of the RV. The RVSP is 73 mmHg + RAP (IVC not   visualized)  which is moderate to more likely severe pulmonary   hypertension. There is moderate TR and moderate biatrial   enlargement.  Laboratory Data:  Chemistry Recent Labs  Lab 03/22/18 2109 03/22/18 2252 03/22/18 2300  NA 138 139 141  K 5.7* 4.3 4.2  CL 104 106 105  CO2 26 29  --   GLUCOSE 182* 177* 167*  BUN 20 18 19   CREATININE 1.08* 1.01* 0.90  CALCIUM 9.0 9.2  --   GFRNONAA 43* 46*  --   GFRAA 50* 54*  --   ANIONGAP 8 4*  --     Lab Results  Component Value Date   ALT 37 11/03/2017   AST 37 11/03/2017   ALKPHOS 50 11/03/2017   BILITOT 0.9 11/03/2017   Hematology Recent Labs  Lab 03/22/18 2109 03/22/18 2300  WBC 8.4  --   RBC 4.94  --   HGB 12.7 13.6  HCT 40.3 40.0  MCV 81.6  --   MCH 25.7*  --   MCHC 31.5  --   RDW 15.2  --   PLT 203  --    Cardiac Enzymes none  BNP Recent Labs  Lab 03/22/18 2115  BNP 349.2*    Lab Results  Component Value Date   TSH 3.197 02/06/2018    Radiology/Studies:  Dg Chest 2 View  Result Date: 03/23/2018 CLINICAL DATA:  84 year old female with syncope. EXAM: CHEST - 2 VIEW COMPARISON:  Chest radiograph dated 02/06/2018 FINDINGS: There is shallow inspiration with bibasilar atelectasis. There is diffuse interstitial  coarsening and nodularity with more confluent areas of airspace density in the lower lung fields similar or slightly improved compared to the most recent radiograph. Findings may represent vascular congestion although superimposed pneumonia is not excluded. Clinical correlation is recommended. Small pleural effusions may be present. There is no pneumothorax. Stable cardiomegaly. No acute osseous pathology. IMPRESSION: Cardiomegaly with findings of vascular congestion. Superimposed pneumonia is not excluded. Clinical correlation is recommended. Overall interval improvement of the lower lobe airspace densities compared to the earlier radiograph. Electronically Signed   By: Elgie Collard M.D.   On: 03/23/2018 00:34   Ct  Head Wo Contrast  Result Date: 03/22/2018 CLINICAL DATA:  Syncopal episode lasting 10 minutes witnessed by family. EXAM: CT HEAD WITHOUT CONTRAST TECHNIQUE: Contiguous axial images were obtained from the base of the skull through the vertex without intravenous contrast. COMPARISON:  11/03/2017 FINDINGS: Brain: Atrophy with moderate small vessel ischemic disease of periventricular and subcortical white matter. Small lacunar infarct left thalamus, chronic in appearance. Small vessel ischemic changes of the internal capsules. No large vascular territory infarct, hemorrhage, midline shift or edema. No hydrocephalus. Midline fourth ventricle and basal cisterns without effacement. Brainstem and cerebellar hemispheres are intact. Vascular: No hyperdense vessel sign. Atherosclerosis of the carotid siphons. Skull: No skull fracture or aggressive osseous lesions. Sinuses/Orbits: Clear paranasal sinuses. Chronic hyperdense but stable appearance of the right globe possibly representing silicone. Other: None IMPRESSION: Chronic moderate small vessel ischemia of periventricular and subcortical white matter with chronic left thalamic lacunar infarct. No acute intracranial abnormality. Electronically Signed   By: Tollie Eth M.D.   On: 03/22/2018 22:07    Assessment and Plan:   Active Problems:   Bradycardia   History of pulmonary embolism   Chronic diastolic (congestive) heart failure (HCC)   Atrial fibrillation (HCC)   Insulin-requiring or dependent type II diabetes mellitus (HCC)   COPD (chronic obstructive pulmonary disease) (HCC)   Syncope  1. Sara Dennis had a syncopal episode with a prodrome and period of retching and nausea prior to that episode which suggest that this was a vasovagal event.  Likely this caused acute on chronic bradycardia which led to her syncope.  I do not see clear indication for pacemaker at this time and she continues to decline in interest in pacemaker even if it were indicated.  She may  have had mild diastolic congestive heart failure and was given Lasix but appears euvolemic today.  In addition there is possible UTI.  She is been treated for this in the past and as noted by medicine had Klebsiella resistant to ampicillin and nitrofurantoin.  Currently on IV ceftriaxone.  She is recently been seen in the ER for chest pain which is likely noncardiac.  Would discontinue isosorbide due to venodilator properties, which may have contributed to her syncope.  Cardiac enzymes have been negative.  Would recommend medical therapy at this point.  No further suggestions at this time.  She can follow-up with me after discharge.  Thanks for the consultation.  Chrystie Nose, MD, [facility], FACP  [place]  [facility]  Medical Director of the [facility]  [facility] Diplomate of the American Board of Clinical Lipidology Attending Cardiologist  Direct Dial: [phone]  Fax: [phone]  Website:  [email or web address]  Chrystie Nose, MD  03/23/2018 9:54 AM

## 2018-03-23 NOTE — Evaluation (Signed)
Physical Therapy Evaluation Patient Details Name: Sara Dennis MRN: [id number] DOB: 1921/10/28 Today's Date: 03/23/2018   History of Present Illness  Pt is a 84 y/o female admitted following a syncopal episode at home. Pt found to have bradycardia. PMH including but not limited to CKD3, bradycardia, history of PE atrial fibrillation on chronic anticoagulation, COPD.    Clinical Impression  Pt presented supine in bed with HOB elevated, awake and willing to participate in therapy session. Pt's granddaughter present throughout session as well. Prior to admission, pt reported that she uses a w/c primarily for mobility and requires some assistance with use of RW for transfers. Pt also stated that she has an aide that assists her with ADLs. Pt lives with her daughter in a single level home with six steps to enter. Pt has been unable to go up and down stairs and requires EMS transport. Pt currently requires min guard for bed mobility, min A for transfers and min A to take a few side steps at EOB with RW. Pt would continue to benefit from skilled physical therapy services at this time while admitted and after d/c to address the below listed limitations in order to improve overall safety and independence with functional mobility.     Follow Up Recommendations Home health PT;Supervision/Assistance - 24 hour    Equipment Recommendations  None recommended by PT    Recommendations for Other Services       Precautions / Restrictions Precautions Precautions: Fall Precaution Comments: pt is blind, can see shadows Restrictions Weight Bearing Restrictions: No      Mobility  Bed Mobility Overal bed mobility: Needs Assistance Bed Mobility: Supine to Sit;Sit to Supine     Supine to sit: Min guard Sit to supine: Min guard   General bed mobility comments: increased time and effort, min guard for safety, use of bed rails  Transfers Overall transfer level: Needs assistance Equipment used: Rolling  walker (2 wheeled) Transfers: Sit to/from Stand Sit to Stand: Min assist         General transfer comment: increased time, min A for stability with transition   Ambulation/Gait             General Gait Details: pt able to take side steps at EOB with RW and min A for safety/stability  Stairs            Wheelchair Mobility    Modified Rankin (Stroke Patients Only)       Balance Overall balance assessment: Needs assistance Sitting-balance support: Feet supported Sitting balance-Leahy Scale: Fair     Standing balance support: Bilateral upper extremity supported Standing balance-Leahy Scale: Poor                               Pertinent Vitals/Pain Pain Assessment: No/denies pain    Home Living Family/patient expects to be discharged to:: Private residence Living Arrangements: Children Available Help at Discharge: Family;Available 24 hours/day   Home Access: Stairs to enter Entrance Stairs-Rails: Right;Left Entrance Stairs-Number of Steps: 6 Home Layout: One level Home Equipment: Walker - 2 wheels;Bedside commode;Wheelchair - manual      Prior Function Level of Independence: Needs assistance   Gait / Transfers Assistance Needed: pt reported that she ambulates very short distances with RW (granddaughter reports its more of a stand-pivot transfer) with RW, but uses a manual w/c primarily for mobility, requires assistance with transfers  ADL's / Homemaking Assistance Needed: requires assistance from  aide for ADLs        Hand Dominance        Extremity/Trunk Assessment   Upper Extremity Assessment Upper Extremity Assessment: Generalized weakness    Lower Extremity Assessment Lower Extremity Assessment: Generalized weakness       Communication   Communication: No difficulties  Cognition Arousal/Alertness: Awake/alert Behavior During Therapy: WFL for tasks assessed/performed Overall Cognitive Status: Impaired/Different from  baseline Area of Impairment: Memory                     Memory: Decreased short-term memory                General Comments      Exercises     Assessment/Plan    PT Assessment Patient needs continued PT services  PT Problem List Decreased strength;Decreased activity tolerance;Decreased balance;Decreased mobility;Decreased coordination;Decreased knowledge of use of DME;Decreased safety awareness;Decreased knowledge of precautions;Cardiopulmonary status limiting activity       PT Treatment Interventions DME instruction;Gait training;Functional mobility training;Stair training;Therapeutic activities;Therapeutic exercise;Balance training;Neuromuscular re-education;Patient/family education    PT Goals (Current goals can be found in the Care Plan section)  Acute Rehab PT Goals Patient Stated Goal: return home PT Goal Formulation: With patient/family Time For Goal Achievement: 04/06/18 Potential to Achieve Goals: Fair    Frequency Min 3X/week   Barriers to discharge        Co-evaluation               AM-PAC PT "6 Clicks" Daily Activity  Outcome Measure Difficulty turning over in bed (including adjusting bedclothes, sheets and blankets)?: A Little Difficulty moving from lying on back to sitting on the side of the bed? : A Little Difficulty sitting down on and standing up from a chair with arms (e.g., wheelchair, bedside commode, etc,.)?: Unable Help needed moving to and from a bed to chair (including a wheelchair)?: A Little Help needed walking in hospital room?: A Lot Help needed climbing 3-5 steps with a railing? : Total 6 Click Score: 13    End of Session Equipment Utilized During Treatment: Gait belt;Oxygen;Other (comment)(pt on 4L of O2) Activity Tolerance: Patient limited by fatigue Patient left: in bed;with call bell/phone within reach;with family/visitor present Nurse Communication: Mobility status PT Visit Diagnosis: Other abnormalities of gait  and mobility (R26.89)    Time: 1610-9604 PT Time Calculation (min) (ACUTE ONLY): 16 min   Charges:   PT Evaluation $PT Eval Moderate Complexity: 1 Mod     PT G Codes:        [place], PT, DPT [phone]   Alessandra Bevels Aston Lieske 03/23/2018, 3:03 PM

## 2018-03-24 ENCOUNTER — Observation Stay (HOSPITAL_COMMUNITY): Payer: Medicare HMO

## 2018-03-24 DIAGNOSIS — R7989 Other specified abnormal findings of blood chemistry: Secondary | ICD-10-CM | POA: Diagnosis not present

## 2018-03-24 DIAGNOSIS — R001 Bradycardia, unspecified: Secondary | ICD-10-CM | POA: Diagnosis not present

## 2018-03-24 DIAGNOSIS — I480 Paroxysmal atrial fibrillation: Secondary | ICD-10-CM | POA: Diagnosis not present

## 2018-03-24 DIAGNOSIS — I5033 Acute on chronic diastolic (congestive) heart failure: Secondary | ICD-10-CM | POA: Diagnosis not present

## 2018-03-24 DIAGNOSIS — I130 Hypertensive heart and chronic kidney disease with heart failure and stage 1 through stage 4 chronic kidney disease, or unspecified chronic kidney disease: Secondary | ICD-10-CM | POA: Diagnosis not present

## 2018-03-24 DIAGNOSIS — R55 Syncope and collapse: Secondary | ICD-10-CM | POA: Diagnosis not present

## 2018-03-24 DIAGNOSIS — M25562 Pain in left knee: Secondary | ICD-10-CM | POA: Diagnosis not present

## 2018-03-24 LAB — GLUCOSE, CAPILLARY
Glucose-Capillary: 182 mg/dL — ABNORMAL HIGH (ref 70–99)
Glucose-Capillary: 204 mg/dL — ABNORMAL HIGH (ref 70–99)
Glucose-Capillary: 235 mg/dL — ABNORMAL HIGH (ref 70–99)
Glucose-Capillary: 194 mg/dL — ABNORMAL HIGH (ref 70–99)

## 2018-03-24 LAB — BASIC METABOLIC PANEL
Anion gap: 6 (ref 5–15)
BUN: 19 mg/dL (ref 8–23)
CHLORIDE: 103 mmol/L (ref 98–111)
CO2: 30 mmol/L (ref 22–32)
CREATININE: 1.13 mg/dL — AB (ref 0.44–1.00)
Calcium: 8.7 mg/dL — ABNORMAL LOW (ref 8.9–10.3)
GFR calc Af Amer: 47 mL/min — ABNORMAL LOW (ref >60)
GFR calc non Af Amer: 41 mL/min — ABNORMAL LOW (ref >60)
GLUCOSE: 173 mg/dL — AB (ref 70–99)
Potassium: 4.1 mmol/L (ref 3.5–5.1)
SODIUM: 139 mmol/L (ref 135–145)

## 2018-03-24 LAB — CBC
HCT: 42.3 % (ref 36.0–46.0)
Hemoglobin: 13.4 g/dL (ref 12.0–15.0)
MCH: 25.2 pg — AB (ref 26.0–34.0)
MCHC: 31.7 g/dL (ref 30.0–36.0)
MCV: 79.7 fL (ref 78.0–100.0)
PLATELETS: 208 10*3/uL (ref 150–400)
RBC: 5.31 MIL/uL — ABNORMAL HIGH (ref 3.87–5.11)
RDW: 14.6 % (ref 11.5–15.5)
WBC: 9.3 10*3/uL (ref 4.0–10.5)

## 2018-03-24 LAB — PROTIME-INR
INR: 2.29
PROTHROMBIN TIME: 25 s — AB (ref 11.4–15.2)

## 2018-03-24 LAB — HEMOGLOBIN A1C
Hgb A1c MFr Bld: 8.2 % — ABNORMAL HIGH (ref 4.8–5.6)
Mean Plasma Glucose: 188.64 mg/dL

## 2018-03-24 LAB — MAGNESIUM: MAGNESIUM: 2.1 mg/dL (ref 1.7–2.4)

## 2018-03-24 MED ORDER — WARFARIN SODIUM 4 MG PO TABS
8.0000 mg | ORAL_TABLET | Freq: Once | ORAL | Status: AC
  Administered 2018-03-24: 8 mg via ORAL
  Filled 2018-03-24: qty 2

## 2018-03-24 MED ORDER — SENNOSIDES-DOCUSATE SODIUM 8.6-50 MG PO TABS
1.0000 | ORAL_TABLET | Freq: Two times a day (BID) | ORAL | Status: DC
  Administered 2018-03-24: 1 via ORAL
  Filled 2018-03-24: qty 1

## 2018-03-24 NOTE — Discharge Instructions (Signed)
Information on my medicine - Coumadin   (Warfarin) You were on this medication prior to this hospital admission.   This medication education was reviewed with me or my healthcare representative as part of my discharge preparation.    Why was Coumadin prescribed for you? Coumadin was prescribed for you because you have a blood clot or a medical condition that can cause an increased risk of forming blood clots. Blood clots can cause serious health problems by blocking the flow of blood to the heart, lung, or brain. Coumadin can prevent harmful blood clots from forming. As a reminder your indication for Coumadin is:   Stroke Prevention Because Of Atrial Fibrillation  What test will check on my response to Coumadin? While on Coumadin (warfarin) you will need to have an INR test regularly to ensure that your dose is keeping you in the desired range. The INR (international normalized ratio) number is calculated from the result of the laboratory test called prothrombin time (PT).  If an INR APPOINTMENT HAS NOT ALREADY BEEN MADE FOR YOU please schedule an appointment to have this lab work done by your health care provider within 7 days. Your INR goal is usually a number between:  2 to 3 or your provider may give you a more narrow range like 2-2.5.  Ask your health care provider during an office visit what your goal INR is.  What  do you need to  know  About  COUMADIN? Take Coumadin (warfarin) exactly as prescribed by your healthcare provider about the same time each day.  DO NOT stop taking without talking to the doctor who prescribed the medication.  Stopping without other blood clot prevention medication to take the place of Coumadin may increase your risk of developing a new clot or stroke.  Get refills before you run out.  What do you do if you miss a dose? If you miss a dose, take it as soon as you remember on the same day then continue your regularly scheduled regimen the next day.  Do not take two  doses of Coumadin at the same time.  Important Safety Information A possible side effect of Coumadin (Warfarin) is an increased risk of bleeding. You should call your healthcare provider right away if you experience any of the following: ? Bleeding from an injury or your nose that does not stop. ? Unusual colored urine (red or dark brown) or unusual colored stools (red or black). ? Unusual bruising for unknown reasons. ? A serious fall or if you hit your head (even if there is no bleeding).  Some foods or medicines interact with Coumadin (warfarin) and might alter your response to warfarin. To help avoid this: ? Eat a balanced diet, maintaining a consistent amount of Vitamin K. ? Notify your provider about major diet changes you plan to make. ? Avoid alcohol or limit your intake to 1 drink for women and 2 drinks for men per day. (1 drink is 5 oz. wine, 12 oz. beer, or 1.5 oz. liquor.)  Make sure that ANY health care provider who prescribes medication for you knows that you are taking Coumadin (warfarin).  Also make sure the healthcare provider who is monitoring your Coumadin knows when you have started a new medication including herbals and non-prescription products.  Coumadin (Warfarin)  Major Drug Interactions  Increased Warfarin Effect Decreased Warfarin Effect  Alcohol (large quantities) Antibiotics (esp. Septra/Bactrim, Flagyl, Cipro) Amiodarone (Cordarone) Aspirin (ASA) Cimetidine (Tagamet) Megestrol (Megace) NSAIDs (ibuprofen, naproxen, etc.) Piroxicam (Feldene)  Propafenone (Rythmol SR) Propranolol (Inderal) Isoniazid (INH) Posaconazole (Noxafil) Barbiturates (Phenobarbital) Carbamazepine (Tegretol) Chlordiazepoxide (Librium) Cholestyramine (Questran) Griseofulvin Oral Contraceptives Rifampin Sucralfate (Carafate) Vitamin K   Coumadin (Warfarin) Major Herbal Interactions  Increased Warfarin Effect Decreased Warfarin Effect  Garlic Ginseng Ginkgo biloba Coenzyme  Q10 Green tea St. Johns wort    Coumadin (Warfarin) FOOD Interactions  Eat a consistent number of servings per week of foods HIGH in Vitamin K (1 serving =  cup)  Collards (cooked, or boiled & drained) Kale (cooked, or boiled & drained) Mustard greens (cooked, or boiled & drained) Parsley *serving size only =  cup Spinach (cooked, or boiled & drained) Swiss chard (cooked, or boiled & drained) Turnip greens (cooked, or boiled & drained)  Eat a consistent number of servings per week of foods MEDIUM-HIGH in Vitamin K (1 serving = 1 cup)  Asparagus (cooked, or boiled & drained) Broccoli (cooked, boiled & drained, or raw & chopped) Brussel sprouts (cooked, or boiled & drained) *serving size only =  cup Lettuce, raw (green leaf, endive, romaine) Spinach, raw Turnip greens, raw & chopped   These websites have more information on Coumadin (warfarin):  FailFactory.se; VeganReport.com.au;

## 2018-03-24 NOTE — Progress Notes (Signed)
ANTICOAGULATION CONSULT NOTE Pharmacy Consult for coumadin Indication: atrial fibrillation  Allergies  Allergen Reactions  . Brilliant Blue Fcf Hives  . Ciprofloxacin Itching  . Gemfibrozil Nausea And Vomiting and Other (See Comments)    Sick and give urine infection  . Iohexol Itching  . Ivp Dye [Iodinated Diagnostic Agents] Itching    itching  . Metrizamide Itching  . Bactrim Diarrhea, Itching, Nausea And Vomiting and Rash  . Sulfamethoxazole-Trimethoprim Diarrhea, Itching, Nausea And Vomiting and Rash    Patient Measurements: Height: 5\' 5"  (165.1 cm) Weight: 224 lb 13.9 oz (102 kg) IBW/kg (Calculated) : 57   Vital Signs: Temp: 97.7 F (36.5 C) (06/28 1157) Temp Source: Oral (06/28 1157) BP: 161/57 (06/28 1157) Pulse Rate: 61 (06/28 1157)  Labs: Recent Labs    03/22/18 2109 03/22/18 2252 03/22/18 2300 03/23/18 0310 03/23/18 0818 03/24/18 0640  HGB 12.7  --  13.6  --   --  13.4  HCT 40.3  --  40.0  --   --  42.3  PLT 203  --   --   --   --  208  LABPROT  --  30.6*  --  29.0*  --  25.0*  INR  --  2.96  --  2.77  --  2.29  CREATININE 1.08* 1.01* 0.90  --   --  1.13*  TROPONINI 0.07*  --   --  0.10* 0.10*  --     Estimated Creatinine Clearance: 36.8 mL/min (A) (by C-G formula based on SCr of 1.13 mg/dL (H)).   Medical History: Past Medical History:  Diagnosis Date  . Arthritis    left leg  . Bradycardia    a. 05/2015: HR 40s-50s, occ 30s at night - not clear how much symptoms were r/t this, patient declined PPM at that time.  . Chronic atrial flutter (HCC)   . Chronic diastolic CHF (congestive heart failure) (HCC)   . Chronic respiratory failure with hypoxia (HCC) 02/06/2018  . CKD (chronic kidney disease), stage III (HCC)   . COPD (chronic obstructive pulmonary disease) (HCC)   . Diabetes mellitus   . Dyspnea   . Glaucoma    a. with significantly decreased vision.  . Hypothyroidism   . Mild dementia   . Moderate to severe pulmonary hypertension (HCC)  By echo 05/2015  . On home oxygen therapy    "?2L; 24/7" (03/23/2018)  . Permanent atrial fibrillation (HCC)   . Pulmonary embolus (HCC) 03-23-11   a. 1978 and 2012.  . Pulmonary fibrosis, unspecified (HCC) 10/26/2017  . RBBB     Medications:  Medications Prior to Admission  Medication Sig Dispense Refill Last Dose  . atorvastatin (LIPITOR) 20 MG tablet Take 1 tablet (20 mg total) by mouth daily at 6 PM. 30 tablet 1 03/21/2018 at pm  . diclofenac sodium (VOLTAREN) 1 % GEL Apply 2-4 g topically 2 (two) times daily as needed (for pain).   Unk at Unk  . donepezil (ARICEPT) 5 MG tablet Take 5 mg by mouth at bedtime.    03/21/2018 at pm  . furosemide (LASIX) 40 MG tablet Take 40 mg by mouth daily.  2 03/22/2018 at am  . insulin glargine (LANTUS) 100 UNIT/ML injection Inject 0.3 mLs (30 Units total) into the skin 2 (two) times daily. 10 mL 11 03/22/2018 at am  . isosorbide mononitrate (IMDUR) 30 MG 24 hr tablet Take 0.5 tablets (15 mg total) by mouth daily. 30 tablet 3 03/22/2018 at am  . levothyroxine (SYNTHROID,  LEVOTHROID) 50 MCG tablet Take 50 mcg by mouth daily before breakfast.   03/22/2018 at am  . OXYGEN Inhale 2 L into the lungs continuous.   Continuous at Continuous  . vitamin B-12 (CYANOCOBALAMIN) 100 MCG tablet Take 100 mcg by mouth daily.  1 03/22/2018 at Unknown time  . warfarin (COUMADIN) 2 MG tablet Take 2 mg by mouth See admin instructions. Take 2 mg by mouth in the evening on Sun/Mon/Wed/Thurs/Sat   03/20/2018 at 1930  . warfarin (COUMADIN) 4 MG tablet Take 1 tablet (4 mg total) by mouth daily at 6 PM. (Patient taking differently: Take 4-8 mg by mouth See admin instructions. Take 4 mg by mouth in the evening on Sun/Mon/Wed/Thurs/Sat and 8 mg on Tues/Fri) 30 tablet 0 03/21/2018 at 1930  . acetaminophen (TYLENOL) 325 MG tablet Take 2 tablets (650 mg total) by mouth every 6 (six) hours as needed for mild pain (or Fever >/= 101). (Patient not taking: Reported on 03/22/2018)   Not Taking at Unknown  time  . cyanocobalamin 500 MCG tablet Take 1 tablet (500 mcg total) by mouth daily. (Patient not taking: Reported on 03/22/2018) 30 tablet 0 Not Taking at Unknown time    Assessment: 64 yoF to continue coumadin for afib.  Her admission INR was therapeutic at 2.96 on 6/26. Last reported PTA dose 6/25. No dose given 6/26. Warfarin resumed 6/27.  INR today: 2.29  CBC remains wnl/stable at this time.  PTA warfarin: 8mg  on tuesday and Friday 4mg  all other days  Goal of Therapy:  INR 2-3 Monitor platelets by anticoagulation protocol: Yes   Plan:  Warfarin 8mg  x1 tonight  (usual home dose) Daily PT/INR Monitor for bleeding complications   Noah Delaine, RPh Clinical Pharmacist Please check AMION for all [facility] numbers 03/24/2018,2:38 PM

## 2018-03-24 NOTE — Progress Notes (Addendum)
PROGRESS NOTE  Sara Dennis KHV:[id number] DOB: 11/01/1921 DOA: 03/22/2018 PCP: No primary care provider on file.  HPI/Recap of past 24 hours:  Very pleasant , denies pain, no sob, no fever She denies urinary symptom Remain bradycardic, but bp stable, denies chest pain, no sob, no dizziness at rest She reports left knee pain, not able to bend or bear weight Cr slightly worsened   Assessment/Plan: Active Problems:   Bradycardia   History of pulmonary embolism   Chronic diastolic (congestive) heart failure (HCC)   Atrial fibrillation (HCC)   Insulin-requiring or dependent type II diabetes mellitus (HCC)   COPD (chronic obstructive pulmonary disease) (HCC)   Syncope   Syncope/bradycardia -per family patient sat down for dinner, then has a witnessed syncopal episode lasted about , patient does not remember the event. Per family patient was acting confused and not like herself prior to the episode -EDP documented bradycardia in the 30's while she is in the ED. -Ct head "Chronic moderate small vessel ischemia of periventricular and subcortical white matter with chronic left thalamic lacunar infarct. No acute intracranial abnormality." -tsh unremakable, k/mag unremarkable -D/c aricept -cardiology consulted, input appreciated, currently no indication for icd, likely a vasovagal event, "patient declined icd placement even if indicated" per cardiology  imdur discontinued per cardiology recommendation to avoid venodilator effect which could lead to syncope. -will get orthostatic vital signs   UTI/encephalopathy? -currently she is pleasant, she denies urinary symptom, no fever, no leukocytosis -urine culture in process -continue empiric abx for now, follow culture result  CKDIII Cr fluctuating, will get renal US   AFib  In afib with bradycardia on tele today on coumadin, INR 2.77  Chronic RBBB, Qtc prolongation K/am unremarkable, repeat ekg in am with  improvement  Chronic diastolic chf:/chronic hypoxic respiratory failure on home 02 for many years per patient  Euvolemic. Continue home dose lasix  HTN;  imdur discontinued, consider lisionpril in the setting of dm2, will discuss with patient and family  Insulin dependent dm2 Continue lantus with ssi, adjust prn  Hypothyroidism, continue synthroid, tsh 3.  FTT: with mild dementia, legally blind, lives at home with 24/7 care, use walker and has wheelchair  Left knee pain, check knee xay, uric acid  Code Status: DNR  Family Communication: patient and daughter over the phone  Disposition Plan: home with home health , likely on 6/29 pending urine culture/cr level   Consultants:  cardiology  Procedures:  none  Antibiotics:  rocephin   Objective: BP (!) 156/62 (BP Location: Right Arm)   Pulse 60   Temp 97.8 F (36.6 C) (Oral)   Resp (!) 22   Ht 5\' 5"  (1.651 m)   Wt 102 kg (224 lb 13.9 oz)   SpO2 97%   BMI 37.42 kg/m   Intake/Output Summary (Last 24 hours) at 03/24/2018 1156 Last data filed at 03/24/2018 0858 Gross per 24 hour  Intake 954.33 ml  Output 400 ml  Net 554.33 ml   Filed Weights   03/22/18 2101 03/23/18 0102 03/24/18 0425  Weight: 113.4 kg (250 lb) 101.3 kg (223 lb 6.4 oz) 102 kg (224 lb 13.9 oz)    Exam: Patient is examined daily including today on 03/24/2018, exams remain the same as of yesterday except that has changed    General:  NAD, legally blind  Cardiovascular: RRR, heart rate 61  Respiratory: CTABL  Abdomen: Soft/ND/NT, positive BS  Musculoskeletal: No Edema  Neuro: alert, oriented to month, place and person, not to the  year  Data Reviewed: Basic Metabolic Panel: Recent Labs  Lab 03/22/18 2109 03/22/18 2252 03/22/18 2300 03/24/18 0640  NA 138 139 141 139  K 5.7* 4.3 4.2 4.1  CL 104 106 105 103  CO2 26 29  --  30  GLUCOSE 182* 177* 167* 173*  BUN 20 18 19 19   CREATININE 1.08* 1.01* 0.90 1.13*  CALCIUM 9.0 9.2  --   8.7*  MG  --  2.1  --  2.1   Liver Function Tests: No results for input(s): AST, ALT, ALKPHOS, BILITOT, PROT, ALBUMIN in the last 168 hours. No results for input(s): LIPASE, AMYLASE in the last 168 hours. No results for input(s): AMMONIA in the last 168 hours. CBC: Recent Labs  Lab 03/22/18 2109 03/22/18 2300 03/24/18 0640  WBC 8.4  --  9.3  HGB 12.7 13.6 13.4  HCT 40.3 40.0 42.3  MCV 81.6  --  79.7  PLT 203  --  208   Cardiac Enzymes:   Recent Labs  Lab 03/22/18 2109 03/23/18 0310 03/23/18 0818  TROPONINI 0.07* 0.10* 0.10*   BNP (last 3 results) Recent Labs    12/12/17 1204 02/06/18 0038 03/22/18 2115  BNP 228.5* 357.2* 349.2*    ProBNP (last 3 results) No results for input(s): PROBNP in the last 8760 hours.  CBG: Recent Labs  Lab 03/23/18 0548 03/23/18 1117 03/23/18 1658 03/23/18 2117 03/24/18 0557  GLUCAP 176* 197* 188* 227* 182*    Recent Results (from the past 240 hour(s))  Culture, Urine     Status: Abnormal (Preliminary result)   Collection Time: 03/23/18  6:53 AM  Result Value Ref Range Status   Specimen Description URINE, RANDOM  Final   Special Requests   Final    NONE Performed at [facility], [address], [place], [place] [address]    Culture >=100,000 COLONIES/mL ESCHERICHIA COLI (A)  Final   Report Status PENDING  Incomplete     Studies: No results found.  Scheduled Meds: . atorvastatin  20 mg Oral q1800  . furosemide  40 mg Oral Daily  . insulin aspart  0-9 Units Subcutaneous TID WC  . insulin glargine  20 Units Subcutaneous BID  . levothyroxine  50 mcg Oral QAC breakfast  . Warfarin - Pharmacist Dosing Inpatient   Does not apply q1800    Continuous Infusions: . cefTRIAXone (ROCEPHIN)  IV 1 g (03/24/18 0343)     Time spent: 35 mins I have personally reviewed and interpreted on  03/24/2018 daily labs, tele strips, imagings as discussed above under date review session and assessment and plans.  I reviewed all  nursing notes, pharmacy notes, consultant notes,  vitals, pertinent old records  I have discussed plan of care as described above with RN , patient  on 03/24/2018   Albertine Grates MD, PhD  [facility] Pager [phone]. If 7PM-7AM, please contact night-coverage at [email or web address], password [facility] 03/24/2018, 11:56 AM  LOS: 0 days

## 2018-03-25 DIAGNOSIS — I130 Hypertensive heart and chronic kidney disease with heart failure and stage 1 through stage 4 chronic kidney disease, or unspecified chronic kidney disease: Secondary | ICD-10-CM | POA: Diagnosis not present

## 2018-03-25 DIAGNOSIS — I5032 Chronic diastolic (congestive) heart failure: Secondary | ICD-10-CM | POA: Diagnosis not present

## 2018-03-25 DIAGNOSIS — I482 Chronic atrial fibrillation: Secondary | ICD-10-CM | POA: Diagnosis not present

## 2018-03-25 DIAGNOSIS — R001 Bradycardia, unspecified: Secondary | ICD-10-CM | POA: Diagnosis not present

## 2018-03-25 DIAGNOSIS — R627 Adult failure to thrive: Secondary | ICD-10-CM

## 2018-03-25 DIAGNOSIS — R55 Syncope and collapse: Secondary | ICD-10-CM | POA: Diagnosis not present

## 2018-03-25 DIAGNOSIS — I5033 Acute on chronic diastolic (congestive) heart failure: Secondary | ICD-10-CM | POA: Diagnosis not present

## 2018-03-25 LAB — BASIC METABOLIC PANEL
ANION GAP: 8 (ref 5–15)
BUN: 24 mg/dL — ABNORMAL HIGH (ref 8–23)
CALCIUM: 8.9 mg/dL (ref 8.9–10.3)
CO2: 28 mmol/L (ref 22–32)
Chloride: 103 mmol/L (ref 98–111)
Creatinine, Ser: 1.17 mg/dL — ABNORMAL HIGH (ref 0.44–1.00)
GFR calc Af Amer: 45 mL/min — ABNORMAL LOW (ref >60)
GFR, EST NON AFRICAN AMERICAN: 39 mL/min — AB (ref >60)
Glucose, Bld: 198 mg/dL — ABNORMAL HIGH (ref 70–99)
Potassium: 4.4 mmol/L (ref 3.5–5.1)
Sodium: 139 mmol/L (ref 135–145)

## 2018-03-25 LAB — URINE CULTURE: Culture: >=100000 — AB

## 2018-03-25 LAB — GLUCOSE, CAPILLARY
GLUCOSE-CAPILLARY: 206 mg/dL — AB (ref 70–99)
GLUCOSE-CAPILLARY: 254 mg/dL — AB (ref 70–99)

## 2018-03-25 LAB — URIC ACID: Uric Acid, Serum: 8.2 mg/dL — ABNORMAL HIGH (ref 2.5–7.1)

## 2018-03-25 LAB — PROTIME-INR
INR: 2.03
Prothrombin Time: 22.7 seconds — ABNORMAL HIGH (ref 11.4–15.2)

## 2018-03-25 MED ORDER — CEPHALEXIN 500 MG PO CAPS: 500.0000 mg | ORAL_CAPSULE | Freq: Two times a day (BID) | ORAL | 0 refills | Status: AC

## 2018-03-25 MED ORDER — CEPHALEXIN 500 MG PO CAPS: 500.0000 mg | ORAL_CAPSULE | Freq: Two times a day (BID) | ORAL | Status: DC

## 2018-03-25 MED ORDER — WARFARIN SODIUM 4 MG PO TABS: 4.0000 mg | ORAL_TABLET | Freq: Once | ORAL | Status: DC

## 2018-03-25 NOTE — Care Management Obs Status (Signed)
MEDICARE OBSERVATION STATUS NOTIFICATION   Patient Details  Name: Sara Dennis MRN: [id number] Date of Birth: 07/25/1922   Medicare Observation Status Notification Given:  Yes    Deveron Furlong, RN 03/25/2018, 10:06 AM

## 2018-03-25 NOTE — Care Management Note (Addendum)
Case Management Note  Patient Details  Name: Sara Dennis MRN: [id number] Date of Birth: 19-Nov-1921  Subjective/Objective:   Pt presents for syncope.  Pt from home with daughter and has wheelchair, oxygen, hospital bed, RW.  Pt has used [facility] HH in the past and would like to be set up with them again for therapy.  Daughter requests a lift for the patient's wheelchair.  She says they do not have room for a separate lift chair, but she has seen a lift for a wheelchair online that she thinks would help.             Action/Plan: Jermaine with [facility] states they do not have any equipment similar to what daughter is requesting.  CM will continue to follow.    Pt will need HH orders and F2F for PT/OT.  Expected Discharge Date:                  Expected Discharge Plan:  Home w Home Health Services  In-House Referral:  NA  Discharge planning Services  CM Consult  Post Acute Care Choice:  Home Health Choice offered to:  Patient, Adult Children  DME Arranged:  N/A DME Agency:  NA  HH Arranged:   PT,OT,SW HH Agency:   [facility]  Status of Service:  In process, will continue to follow  If discussed at Long Length of Stay Meetings, dates discussed:    Additional Comments:  1415- Alvino Chapel with [facility] accepted referral.    Deveron Furlong, RN 03/25/2018, 10:55 AM

## 2018-03-25 NOTE — Progress Notes (Signed)
03/25/2018 1500 Pt discharged to home via [facility].   Kathryne Hitch

## 2018-03-25 NOTE — Discharge Summary (Signed)
Discharge Summary  Sara Dennis:[id number] DOB: 22-May-1922  PCP: No primary care provider on file.  Admit date: 03/22/2018 Discharge date: 03/25/2018  Time spent: , more than 50% time spent on coordination of care  Recommendations for Outpatient Follow-up:  1. F/u with PMD within a week  for hospital discharge follow up, repeat cbc/bmp at follow up. 2. F/u with cardiology Dr Rennis Golden for bradycardia/afib 3. Home health arranged  Discharge Diagnoses:  Active Hospital Problems   Diagnosis Date Noted  . Syncope 03/22/2018  . COPD (chronic obstructive pulmonary disease) (HCC) 02/06/2018  . Insulin-requiring or dependent type II diabetes mellitus (HCC) 02/06/2018  . Atrial fibrillation (HCC) 02/05/2016  . Chronic diastolic (congestive) heart failure (HCC) 09/06/2015  . Bradycardia 06/11/2015  . History of pulmonary embolism 06/11/2015    Resolved Hospital Problems  No resolved problems to display.    Discharge Condition: stable  Diet recommendation: heart healthy  Filed Weights   03/23/18 0102 03/24/18 0425 03/25/18 0349  Weight: 101.3 kg (223 lb 6.4 oz) 102 kg (224 lb 13.9 oz) 109.2 kg (240 lb 11.9 oz)    History of present illness: (per admitting MD Dr Mariea Clonts) Patient coming from: Home  Chief Complaint: Unresponsiveness  HPI: Sara Dennis is a 84 y.o. female with medical history significant for CKD3, bradycardia, history of PE atrial fibrillation on chronic anticoagulation, COPD.  Patient was brought to the ED via EMS with reports of 5 to 10 minutes of unresponsiveness.  Patient awake and alert but does not remember the details of what happened.  Daughter provides most of the history.  Reports earlier in the day, patient complained of abdominal pain.  Later appeared a little confused when she was trying to lay on her bed to take a nap.  When she woke, she said she did not feel well , daughter put her in a wheelchair and took her to the dinning room for dinner. At  the table, .daughter says patient started dry heaving, then head slumped to the side, patient had drooling from one side of her mouth, lips and fingers became blue. By the time EMS arrived, patient was back to baseline and colour had improved.  No facial asymmetry noted, speech has been at baseline.  Patient denies shortness of breath or chest pain.  Reports mild intermittent dizziness sometimes.  No fever or chills.  Patient has maintained good oral Intake.  No actual vomiting, no loose stools.  Patient reports compliance with her Lasix 40 daily, and warfarin.  At baseline patient is on 2 L O2. Recent hospital admission 5/13 - 02/07/18-chest pain thought musculokeletal, also bradycardic while sleeping as low as 40s.  ED Course: Heart rate documented 59-60, but drops as low as 38 in ED while patient as awake and alert, had no symptoms, gradually recovered to 60. blood pressure systolic 160s. Stable O2 sats >93% on home 2 L nasal cannula.  Troponin mildly elevated compared to prior at 0.07, WBC WNL 8.4, hemoglobin stable 12.7, BNP similar to prior 349.  EKG- reported by EDP-no acute findings, LVH.  Head CT-chronic lacunar infarct.  Hospitalist was called to admit for syncope.     Hospital Course:  Active Problems:   Bradycardia   History of pulmonary embolism   Chronic diastolic (congestive) heart failure (HCC)   Atrial fibrillation (HCC)   Insulin-requiring or dependent type II diabetes mellitus (HCC)   COPD (chronic obstructive pulmonary disease) (HCC)   Syncope   Syncope/bradycardia -per family patient sat down for  dinner, then has a witnessed syncopal episode lasted about , patient does not remember the event. Per family patient was acting confused and not like herself prior to the episode -EDP documented bradycardia in the 30's while she is in the ED. -Ct head "Chronic moderate small vessel ischemia of periventricular and subcortical white matter with chronic left thalamic lacunar  infarct. No acute intracranial abnormality." -tsh unremakable, k/mag unremarkable -D/c aricept -cardiology consulted, input appreciated, currently no indication for icd, likely a vasovagal event, "patient declined icd placement even if indicated" per cardiology  imdur discontinued per cardiology recommendation to avoid venodilator effect which could lead to syncope. -orthostatic vital signs unremarkable  UTI/encephalopathy? -currently she is pleasant, she denies urinary symptom, no fever, no leukocytosis -urine culture + pan sensitive ecoli, she received 3 days of rocephin in the hospital , she is discharged on days of keflex   CKDIII Cr fluctuating slightly, renal US no acute issues, no urinary retention by bladder scan   AFib  In afib with bradycardia on tele  on coumadin, INR 2.77  Chronic RBBB, Qtc prolongation K/mag unremarkable, repeat ekg in am with improvement  Chronic diastolic chf:/chronic hypoxic respiratory failure on home 02 for many years per patient (she also has interstitial lung disease per chart review) Euvolemic. Continue home dose lasix  HTN;  imdur discontinued due to concerns about contributing to syncope bp stable on lasix only now.  Insulin dependent dm2 Continue lantus with ssi  Hypothyroidism, continue synthroid, tsh 3.  FTT: with mild dementia, legally blind, lives at home with 24/7 care, use walker and has wheelchair. Home health   Left knee pain, knee xay + arthritis, no acute findings, uric acid slightly elevated, knee pain has improved at discharge, no edema, f/u with pcp.  Code Status: DNR  Family Communication: patient and daughter over the phone  Disposition Plan: home with home health  on 6/29   Consultants:  cardiology  Procedures:  none  Antibiotics:  rocephin   Discharge Exam: BP 139/61 (BP Location: Left Arm)   Pulse 60   Temp 97.8 F (36.6 C)   Resp 19   Ht 5\' 5"  (1.651 m)   Wt 109.2 kg  (240 lb 11.9 oz)   SpO2 93%   BMI 40.06 kg/m    General:  NAD, legally blind  Cardiovascular: RRR, heart rate 61  Respiratory: CTABL  Abdomen: Soft/ND/NT, positive BS  Musculoskeletal: No Edema  Neuro: alert, oriented to month, place and person, not to the year   Discharge Instructions You were cared for by a hospitalist during your hospital stay. If you have any questions about your discharge medications or the care you received while you were in the hospital after you are discharged, you can call the unit and asked to speak with the hospitalist on call if the hospitalist that took care of you is not available. Once you are discharged, your primary care physician will handle any further medical issues. Please note that NO REFILLS for any discharge medications will be authorized once you are discharged, as it is imperative that you return to your primary care physician (or establish a relationship with a primary care physician if you do not have one) for your aftercare needs so that they can reassess your need for medications and monitor your lab values.  Discharge Instructions    Diet - low sodium heart healthy   Complete by:  As directed    Increase activity slowly   Complete by:  As  directed      Allergies as of 03/25/2018      Reactions   Brilliant Blue Fcf Hives   Ciprofloxacin Itching   Gemfibrozil Nausea And Vomiting, Other (See Comments)   Sick and give urine infection   Iohexol Itching   Ivp Dye [iodinated Diagnostic Agents] Itching   itching   Metrizamide Itching   Bactrim Diarrhea, Itching, Nausea And Vomiting, Rash   Sulfamethoxazole-trimethoprim Diarrhea, Itching, Nausea And Vomiting, Rash      Medication List    STOP taking these medications   donepezil 5 MG tablet Commonly known as:  ARICEPT   isosorbide mononitrate 30 MG 24 hr tablet Commonly known as:  IMDUR     TAKE these medications   acetaminophen 325 MG tablet Commonly known as:   TYLENOL Take 2 tablets (650 mg total) by mouth every 6 (six) hours as needed for mild pain (or Fever >/= 101).   atorvastatin 20 MG tablet Commonly known as:  LIPITOR Take 1 tablet (20 mg total) by mouth daily at 6 PM.   cephALEXin 500 MG capsule Commonly known as:  KEFLEX Take 1 capsule (500 mg total) by mouth every 12 (twelve) hours for 2 days. Start taking on:  03/26/2018   furosemide 40 MG tablet Commonly known as:  LASIX Take 40 mg by mouth daily.   insulin glargine 100 UNIT/ML injection Commonly known as:  LANTUS Inject 0.3 mLs (30 Units total) into the skin 2 (two) times daily.   levothyroxine 50 MCG tablet Commonly known as:  SYNTHROID, LEVOTHROID Take 50 mcg by mouth daily before breakfast.   OXYGEN Inhale 2 L into the lungs continuous.   vitamin B-12 100 MCG tablet Commonly known as:  CYANOCOBALAMIN Take 100 mcg by mouth daily. What changed:  Another medication with the same name was removed. Continue taking this medication, and follow the directions you see here.   VOLTAREN 1 % Gel Generic drug:  diclofenac sodium Apply 2-4 g topically 2 (two) times daily as needed (for pain).   warfarin 2 MG tablet Commonly known as:  COUMADIN Take 2 mg by mouth See admin instructions. Take 2 mg by mouth in the evening on Sun/Mon/Wed/Thurs/Sat What changed:  Another medication with the same name was changed. Make sure you understand how and when to take each.   warfarin 4 MG tablet Commonly known as:  COUMADIN Take 1 tablet (4 mg total) by mouth daily at 6 PM. What changed:    how much to take  when to take this  additional instructions      Allergies  Allergen Reactions  . Brilliant Blue Fcf Hives  . Ciprofloxacin Itching  . Gemfibrozil Nausea And Vomiting and Other (See Comments)    Sick and give urine infection  . Iohexol Itching  . Ivp Dye [Iodinated Diagnostic Agents] Itching    itching  . Metrizamide Itching  . Bactrim Diarrhea, Itching, Nausea And  Vomiting and Rash  . Sulfamethoxazole-Trimethoprim Diarrhea, Itching, Nausea And Vomiting and Rash   Follow-up Information    Hilty, Lisette Abu, MD Follow up.   Specialty:  Cardiology Contact information: [address] [place] [address] [phone]        Coralie Common, MD Follow up in 1 week(s).   Specialty:  General Practice Why:  hospital discharge follow up, repeat cbc/bmp at follow up. pcp to monitor uric acid level. Contact information: [address] [place] [address] [phone]  The results of significant diagnostics from this hospitalization (including imaging, microbiology, ancillary and laboratory) are listed below for reference.    Significant Diagnostic Studies: Dg Chest 2 View  Result Date: 03/23/2018 CLINICAL DATA:  84 year old female with syncope. EXAM: CHEST - 2 VIEW COMPARISON:  Chest radiograph dated 02/06/2018 FINDINGS: There is shallow inspiration with bibasilar atelectasis. There is diffuse interstitial coarsening and nodularity with more confluent areas of airspace density in the lower lung fields similar or slightly improved compared to the most recent radiograph. Findings may represent vascular congestion although superimposed pneumonia is not excluded. Clinical correlation is recommended. Small pleural effusions may be present. There is no pneumothorax. Stable cardiomegaly. No acute osseous pathology. IMPRESSION: Cardiomegaly with findings of vascular congestion. Superimposed pneumonia is not excluded. Clinical correlation is recommended. Overall interval improvement of the lower lobe airspace densities compared to the earlier radiograph. Electronically Signed   By: Elgie Collard M.D.   On: 03/23/2018 00:34   Dg Knee 1-2 Views Left  Result Date: 03/25/2018 CLINICAL DATA:  Left knee pain. EXAM: LEFT KNEE - 1-2 VIEW COMPARISON:  None. FINDINGS: Tricompartmental degenerative changes, most prominent in the  medial compartment. No acute fractures or bony lesions. No significant joint effusion. No other acute abnormalities. IMPRESSION: Tricompartmental degenerative changes as above. Electronically Signed   By: Gerome Sam III M.D   On: 03/25/2018 12:37   Ct Head Wo Contrast  Result Date: 03/22/2018 CLINICAL DATA:  Syncopal episode lasting 10 minutes witnessed by family. EXAM: CT HEAD WITHOUT CONTRAST TECHNIQUE: Contiguous axial images were obtained from the base of the skull through the vertex without intravenous contrast. COMPARISON:  11/03/2017 FINDINGS: Brain: Atrophy with moderate small vessel ischemic disease of periventricular and subcortical white matter. Small lacunar infarct left thalamus, chronic in appearance. Small vessel ischemic changes of the internal capsules. No large vascular territory infarct, hemorrhage, midline shift or edema. No hydrocephalus. Midline fourth ventricle and basal cisterns without effacement. Brainstem and cerebellar hemispheres are intact. Vascular: No hyperdense vessel sign. Atherosclerosis of the carotid siphons. Skull: No skull fracture or aggressive osseous lesions. Sinuses/Orbits: Clear paranasal sinuses. Chronic hyperdense but stable appearance of the right globe possibly representing silicone. Other: None IMPRESSION: Chronic moderate small vessel ischemia of periventricular and subcortical white matter with chronic left thalamic lacunar infarct. No acute intracranial abnormality. Electronically Signed   By: Tollie Eth M.D.   On: 03/22/2018 22:07   US Renal  Result Date: 03/24/2018 CLINICAL DATA:  Initial evaluation for elevated creatinine EXAM: RENAL / URINARY TRACT ULTRASOUND COMPLETE COMPARISON:  None available. FINDINGS: Right Kidney: Length: 8.8 cm. Echogenicity within normal limits. No mass or hydronephrosis visualized. Left Kidney: Length: 10.2 cm. Echogenicity within normal limits. No mass or hydronephrosis visualized. Bladder: Appears normal for degree of  bladder distention. IMPRESSION: Negative renal ultrasound.  No hydronephrosis. Electronically Signed   By: Rise Mu M.D.   On: 03/24/2018 23:33    Microbiology: Recent Results (from the past 240 hour(s))  Culture, Urine     Status: Abnormal   Collection Time: 03/23/18  6:53 AM  Result Value Ref Range Status   Specimen Description URINE, RANDOM  Final   Special Requests   Final    NONE Performed at [facility], [address], [place], [place] [address]    Culture >=100,000 COLONIES/mL ESCHERICHIA COLI (A)  Final   Report Status 03/25/2018 FINAL  Final   Organism ID, Bacteria ESCHERICHIA COLI (A)  Final      Susceptibility  Escherichia coli - MIC*    AMPICILLIN 4 SENSITIVE Sensitive     CEFAZOLIN <=4 SENSITIVE Sensitive     CEFTRIAXONE <=1 SENSITIVE Sensitive     CIPROFLOXACIN <=0.25 SENSITIVE Sensitive     GENTAMICIN <=1 SENSITIVE Sensitive     IMIPENEM <=0.25 SENSITIVE Sensitive     NITROFURANTOIN <=16 SENSITIVE Sensitive     TRIMETH/SULFA <=20 SENSITIVE Sensitive     AMPICILLIN/SULBACTAM <=2 SENSITIVE Sensitive     PIP/TAZO <=4 SENSITIVE Sensitive     Extended ESBL NEGATIVE Sensitive     * >=100,000 COLONIES/mL ESCHERICHIA COLI     Labs: Basic Metabolic Panel: Recent Labs  Lab 03/22/18 2109 03/22/18 2252 03/22/18 2300 03/24/18 0640 03/25/18 0249  NA 138 139 141 139 139  K 5.7* 4.3 4.2 4.1 4.4  CL 104 106 105 103 103  CO2 26 29  --  30 28  GLUCOSE 182* 177* 167* 173* 198*  BUN 20 18 19 19  24*  CREATININE 1.08* 1.01* 0.90 1.13* 1.17*  CALCIUM 9.0 9.2  --  8.7* 8.9  MG  --  2.1  --  2.1  --    Liver Function Tests: No results for input(s): AST, ALT, ALKPHOS, BILITOT, PROT, ALBUMIN in the last 168 hours. No results for input(s): LIPASE, AMYLASE in the last 168 hours. No results for input(s): AMMONIA in the last 168 hours. CBC: Recent Labs  Lab 03/22/18 2109 03/22/18 2300 03/24/18 0640  WBC 8.4  --  9.3  HGB 12.7 13.6 13.4  HCT 40.3 40.0  42.3  MCV 81.6  --  79.7  PLT 203  --  208   Cardiac Enzymes: Recent Labs  Lab 03/22/18 2109 03/23/18 0310 03/23/18 0818  TROPONINI 0.07* 0.10* 0.10*   BNP: BNP (last 3 results) Recent Labs    12/12/17 1204 02/06/18 0038 03/22/18 2115  BNP 228.5* 357.2* 349.2*    ProBNP (last 3 results) No results for input(s): PROBNP in the last 8760 hours.  CBG: Recent Labs  Lab 03/24/18 1155 03/24/18 1645 03/24/18 2118 03/25/18 0544 03/25/18 1130  GLUCAP 204* 235* 194* 206* 254*       Signed:  Albertine Grates MD, PhD  [facility] 03/25/2018, 1:19 PM

## 2018-03-25 NOTE — Progress Notes (Signed)
ANTICOAGULATION CONSULT NOTE Pharmacy Consult for coumadin Indication: atrial fibrillation  Allergies  Allergen Reactions  . Brilliant Blue Fcf Hives  . Ciprofloxacin Itching  . Gemfibrozil Nausea And Vomiting and Other (See Comments)    Sick and give urine infection  . Iohexol Itching  . Ivp Dye [Iodinated Diagnostic Agents] Itching    itching  . Metrizamide Itching  . Bactrim Diarrhea, Itching, Nausea And Vomiting and Rash  . Sulfamethoxazole-Trimethoprim Diarrhea, Itching, Nausea And Vomiting and Rash   Patient Measurements: Height: 5\' 5"  (165.1 cm) Weight: 240 lb 11.9 oz (109.2 kg) IBW/kg (Calculated) : 57  Vital Signs: Temp: 97.8 F (36.6 C) (06/29 0349) BP: 139/61 (06/29 0349) Pulse Rate: 60 (06/29 0349)  Labs: Recent Labs    03/22/18 2109  03/22/18 2300 03/23/18 0310 03/23/18 0818 03/24/18 0640 03/25/18 0249  HGB 12.7  --  13.6  --   --  13.4  --   HCT 40.3  --  40.0  --   --  42.3  --   PLT 203  --   --   --   --  208  --   LABPROT  --    < >  --  29.0*  --  25.0* 22.7*  INR  --    < >  --  2.77  --  2.29 2.03  CREATININE 1.08*   < > 0.90  --   --  1.13* 1.17*  TROPONINI 0.07*  --   --  0.10* 0.10*  --   --    < > = values in this interval not displayed.   Estimated Creatinine Clearance: 36.9 mL/min (A) (by C-G formula based on SCr of 1.17 mg/dL (H)).  Medical History: Past Medical History:  Diagnosis Date  . Arthritis    left leg  . Bradycardia    a. 05/2015: HR 40s-50s, occ 30s at night - not clear how much symptoms were r/t this, patient declined PPM at that time.  . Chronic atrial flutter (HCC)   . Chronic diastolic CHF (congestive heart failure) (HCC)   . Chronic respiratory failure with hypoxia (HCC) 02/06/2018  . CKD (chronic kidney disease), stage III (HCC)   . COPD (chronic obstructive pulmonary disease) (HCC)   . Diabetes mellitus   . Dyspnea   . Glaucoma    a. with significantly decreased vision.  . Hypothyroidism   . Mild dementia    . Moderate to severe pulmonary hypertension (HCC) By echo 05/2015  . On home oxygen therapy    "?2L; 24/7" (03/23/2018)  . Permanent atrial fibrillation (HCC)   . Pulmonary embolus (HCC) 03-23-11   a. 1978 and 2012.  . Pulmonary fibrosis, unspecified (HCC) 10/26/2017  . RBBB    Medications:  Medications Prior to Admission  Medication Sig Dispense Refill Last Dose  . atorvastatin (LIPITOR) 20 MG tablet Take 1 tablet (20 mg total) by mouth daily at 6 PM. 30 tablet 1 03/21/2018 at pm  . diclofenac sodium (VOLTAREN) 1 % GEL Apply 2-4 g topically 2 (two) times daily as needed (for pain).   Unk at Unk  . donepezil (ARICEPT) 5 MG tablet Take 5 mg by mouth at bedtime.    03/21/2018 at pm  . furosemide (LASIX) 40 MG tablet Take 40 mg by mouth daily.  2 03/22/2018 at am  . insulin glargine (LANTUS) 100 UNIT/ML injection Inject 0.3 mLs (30 Units total) into the skin 2 (two) times daily. 10 mL 11 03/22/2018 at am  . isosorbide  mononitrate (IMDUR) 30 MG 24 hr tablet Take 0.5 tablets (15 mg total) by mouth daily. 30 tablet 3 03/22/2018 at am  . levothyroxine (SYNTHROID, LEVOTHROID) 50 MCG tablet Take 50 mcg by mouth daily before breakfast.   03/22/2018 at am  . OXYGEN Inhale 2 L into the lungs continuous.   Continuous at Continuous  . vitamin B-12 (CYANOCOBALAMIN) 100 MCG tablet Take 100 mcg by mouth daily.  1 03/22/2018 at Unknown time  . warfarin (COUMADIN) 2 MG tablet Take 2 mg by mouth See admin instructions. Take 2 mg by mouth in the evening on Sun/Mon/Wed/Thurs/Sat   03/20/2018 at 1930  . warfarin (COUMADIN) 4 MG tablet Take 1 tablet (4 mg total) by mouth daily at 6 PM. (Patient taking differently: Take 4-8 mg by mouth See admin instructions. Take 4 mg by mouth in the evening on Sun/Mon/Wed/Thurs/Sat and 8 mg on Tues/Fri) 30 tablet 0 03/21/2018 at 1930  . acetaminophen (TYLENOL) 325 MG tablet Take 2 tablets (650 mg total) by mouth every 6 (six) hours as needed for mild pain (or Fever >/= 101). (Patient not  taking: Reported on 03/22/2018)   Not Taking at Unknown time  . cyanocobalamin 500 MCG tablet Take 1 tablet (500 mcg total) by mouth daily. (Patient not taking: Reported on 03/22/2018) 30 tablet 0 Not Taking at Unknown time    Assessment: 56 yoF to continue coumadin for afib.  Her admission INR was therapeutic at 2.96 on 6/26. Last reported PTA dose 6/25. No dose given 6/26. Warfarin resumed 6/27.   INR today: 2.03 continues to trend down likely due to missed dose on 6/26; will continue home regimen as she received higher dose yesterday.  CBC remains wnl/stable at this time.  PTA warfarin: 8mg  on tuesday and Friday 4mg  all other days  Goal of Therapy:  INR 2-3 Monitor platelets by anticoagulation protocol: Yes   Plan:  Warfarin 4mg  x1 tonight  (usual home dose) Daily PT/INR Monitor for bleeding complications   Ruben Im, PharmD Clinical Pharmacist 03/25/2018 11:44 AM Please check AMION for all [facility] numbers

## 2018-03-25 NOTE — Progress Notes (Signed)
SW has arranged [facility] to transport Pt home. Pt will return home with [facility] services. Will continue to assist as needed.

## 2018-03-27 ENCOUNTER — Telehealth: Payer: Self-pay | Admitting: Internal Medicine

## 2018-03-27 NOTE — Telephone Encounter (Signed)
New Message:     She needs a verbal order for Physical Therapy for 2 times a week for 5 weeks please. Please start the order from 03-26-18 through 04-29-18. Please leave a message if she does not answer.

## 2018-03-27 NOTE — Telephone Encounter (Signed)
Left message to call back  ? What is PT for and who is ordering MD

## 2018-03-28 NOTE — Telephone Encounter (Signed)
Left detailed message.   

## 2018-03-28 NOTE — Telephone Encounter (Signed)
Spoke with [place] regarding home PT. Patient is needing PT for strengthening and endurance. She has no local PCP, staying in [place] with family member.  Home health was ordered at recent hospital d/c. Explained to[place] Dr Rennis Golden is her cardiologist and this type of request is usually handled by PCP. She requested checking with Dr Rennis Golden to see if he would sign off on orders. Will forward for review.

## 2018-03-28 NOTE — Telephone Encounter (Signed)
That is fine to authorize home PT under my name. Thanks.  Dr. Rexene Edison

## 2018-05-03 ENCOUNTER — Telehealth: Payer: Self-pay | Admitting: Internal Medicine

## 2018-05-03 NOTE — Telephone Encounter (Signed)
New message  Per Delice Bison, she wants to know if the order has been signed and faxed back over to her. Please call Delice Bison to discuss if the order has been finalized.

## 2018-05-03 NOTE — Telephone Encounter (Signed)
Returned call to [employer] with [employer]. She states on 7/26 she faxed over a home health certification and plan of care for MD to sign. Explained that I do not have this form. She will refax.

## 2018-05-04 NOTE — Telephone Encounter (Signed)
Faxed signed home health certification and plan of care to [facility] @ [phone]

## 2018-06-04 ENCOUNTER — Other Ambulatory Visit: Payer: Self-pay | Admitting: Internal Medicine

## 2019-05-11 IMAGING — DX DG CHEST 2V
2 series · 2 of 2 positions shown · non-contrast
Comparison: 03/16/2016

CLINICAL DATA: Cough for 2 weeks. Shortness of breath on exertion.
History of chronic atrial flutter, CHF, diabetes, hypertension,
pulmonary embolus.

EXAM:
CHEST  2 VIEW

[chest lat]
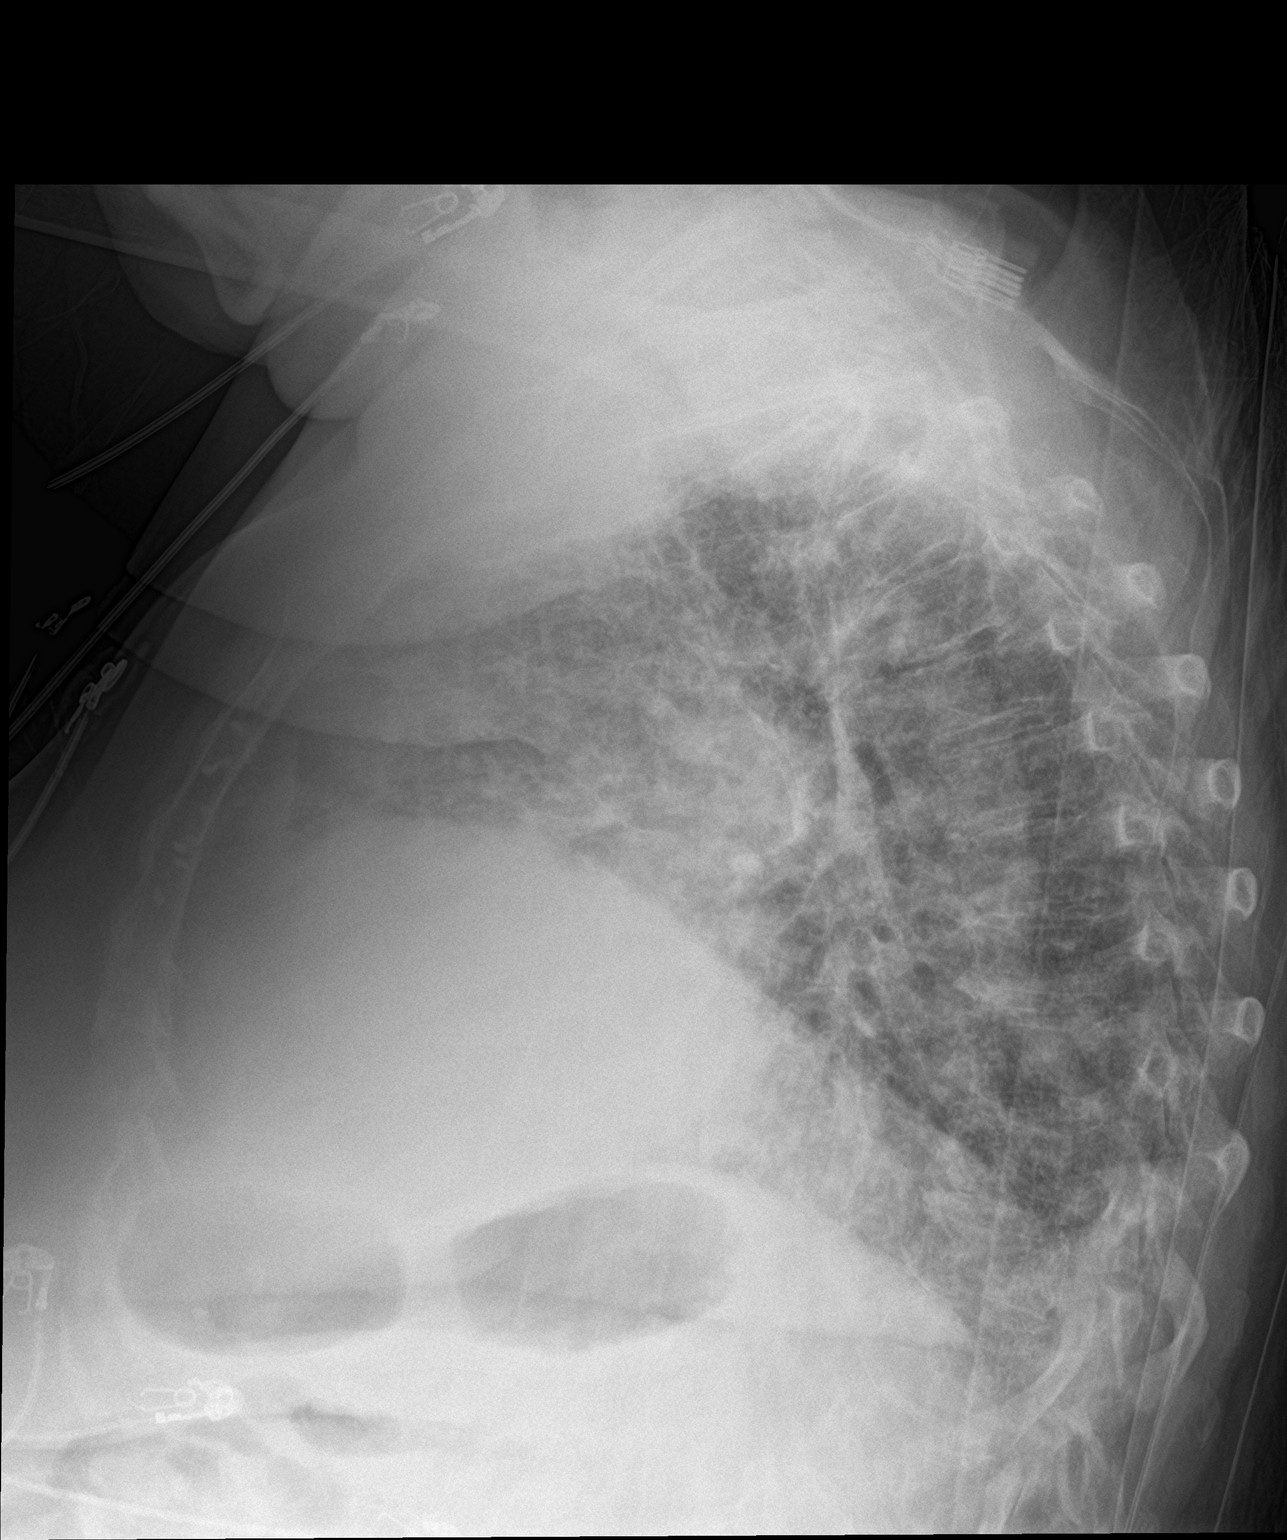

[chest ap]
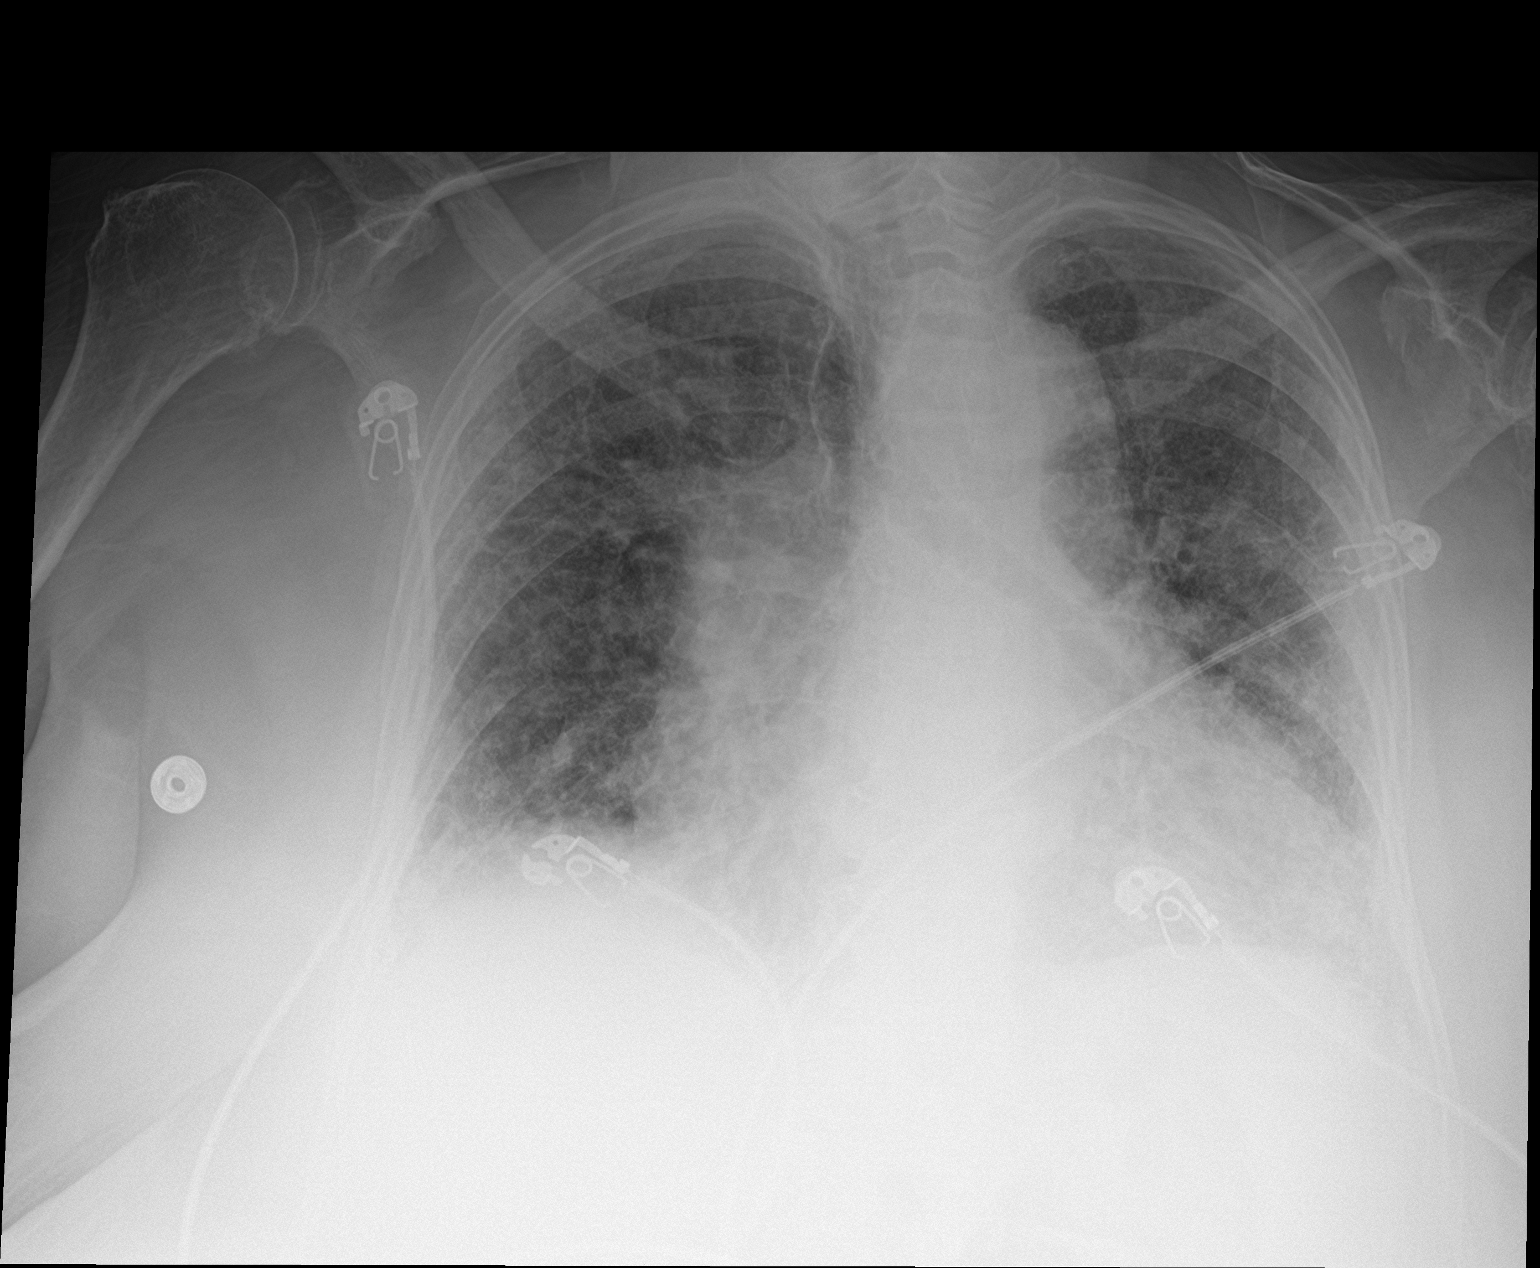

[2 of 2 positions shown; findings below may reference images not displayed]

FINDINGS: Cardiac enlargement with probable pulmonary vascular congestion.
Interstitial changes in the lungs probably representing a
combination of chronic fibrosis with superimposed interstitial
edema. Interstitial changes appear more prominent than on the
previous study. No pleural effusions. No pneumothorax. Tortuous
aorta. Degenerative changes in the spine.
IMPRESSION: Increasing cardiac enlargement and interstitial pattern since
previous study likely represents progression of congestive failure
and interstitial edema superimposed upon chronic fibrosis.

## 2019-05-12 IMAGING — DX DG CHEST 1V PORT
1 series · 1 of 1 positions shown · non-contrast
Comparison: 10/19/2017

CLINICAL DATA: Worsening cough for 2 weeks.

EXAM:
PORTABLE CHEST 1 VIEW

[chest ap]
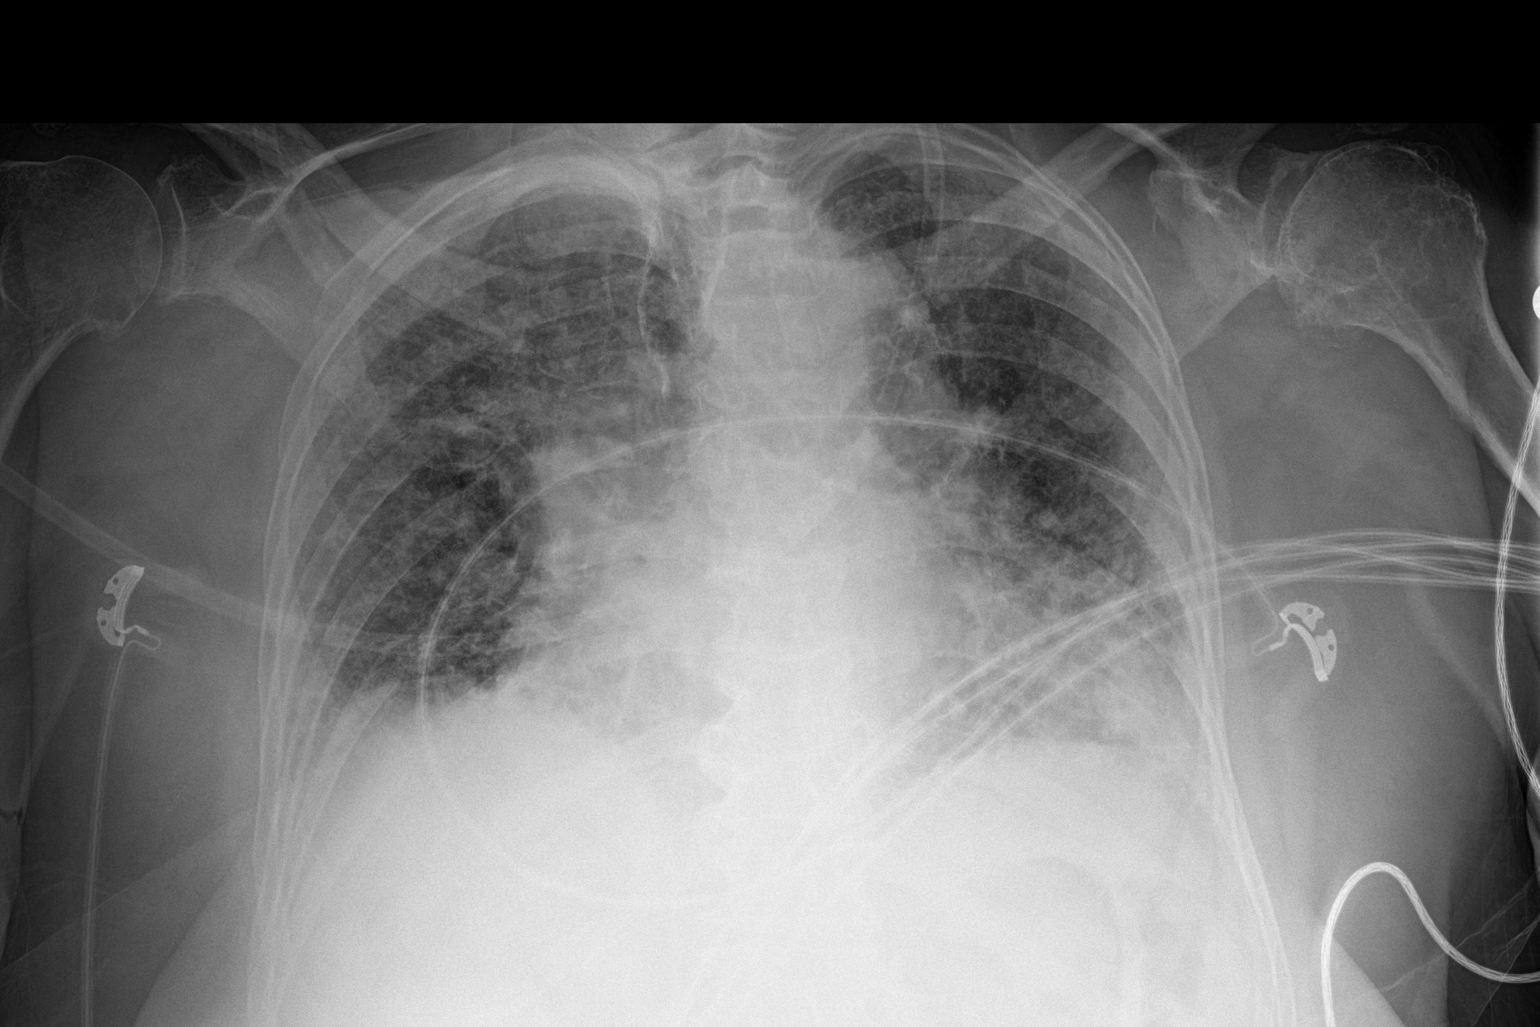

[1 of 1 positions shown; findings below may reference images not displayed]

FINDINGS: Shallow inspiration. Cardiac enlargement with some vascular
congestion. Diffuse interstitial pattern throughout the lungs is
similar to previous studies. This probably represents diffuse
fibrosis with superimposed edema. Mild progression since the
previous study. No evidence of pleural effusion or pneumothorax.
Degenerative changes in the spine and shoulders.
IMPRESSION: Shallow inspiration. Diffuse cardiac enlargement with diffuse
interstitial pattern progressing since previous study. This likely
represents chronic fibrosis with superimposed edema.

## 2019-05-29 DEATH — deceased
